# Patient Record
Sex: Male | Born: 1955 | Race: White | Hispanic: No | Marital: Married | Smoking: Never smoker
Health system: Southern US, Community
[De-identification: ages and names within clinical notes are randomized; demographics above are authoritative.]

## PROBLEM LIST (undated history)

## (undated) DIAGNOSIS — I700 Atherosclerosis of aorta: Secondary | ICD-10-CM

## (undated) DIAGNOSIS — K219 Gastro-esophageal reflux disease without esophagitis: Secondary | ICD-10-CM

## (undated) DIAGNOSIS — I420 Dilated cardiomyopathy: Secondary | ICD-10-CM

## (undated) DIAGNOSIS — R931 Abnormal findings on diagnostic imaging of heart and coronary circulation: Secondary | ICD-10-CM

## (undated) DIAGNOSIS — I712 Thoracic aortic aneurysm, without rupture: Secondary | ICD-10-CM

## (undated) DIAGNOSIS — Z87448 Personal history of other diseases of urinary system: Secondary | ICD-10-CM

## (undated) DIAGNOSIS — I351 Nonrheumatic aortic (valve) insufficiency: Secondary | ICD-10-CM

## (undated) DIAGNOSIS — D649 Anemia, unspecified: Secondary | ICD-10-CM

## (undated) DIAGNOSIS — I77810 Thoracic aortic ectasia: Secondary | ICD-10-CM

## (undated) DIAGNOSIS — Z9289 Personal history of other medical treatment: Secondary | ICD-10-CM

## (undated) DIAGNOSIS — I499 Cardiac arrhythmia, unspecified: Secondary | ICD-10-CM

## (undated) DIAGNOSIS — I5022 Chronic systolic (congestive) heart failure: Secondary | ICD-10-CM

## (undated) DIAGNOSIS — I493 Ventricular premature depolarization: Secondary | ICD-10-CM

## (undated) DIAGNOSIS — I10 Essential (primary) hypertension: Secondary | ICD-10-CM

## (undated) DIAGNOSIS — T4145XA Adverse effect of unspecified anesthetic, initial encounter: Secondary | ICD-10-CM

## (undated) DIAGNOSIS — I480 Paroxysmal atrial fibrillation: Secondary | ICD-10-CM

## (undated) DIAGNOSIS — Z8719 Personal history of other diseases of the digestive system: Secondary | ICD-10-CM

## (undated) DIAGNOSIS — I4891 Unspecified atrial fibrillation: Secondary | ICD-10-CM

## (undated) DIAGNOSIS — M069 Rheumatoid arthritis, unspecified: Secondary | ICD-10-CM

## (undated) DIAGNOSIS — K759 Inflammatory liver disease, unspecified: Secondary | ICD-10-CM

## (undated) DIAGNOSIS — R9430 Abnormal result of cardiovascular function study, unspecified: Secondary | ICD-10-CM

## (undated) DIAGNOSIS — Z95810 Presence of automatic (implantable) cardiac defibrillator: Secondary | ICD-10-CM

## (undated) DIAGNOSIS — T8859XA Other complications of anesthesia, initial encounter: Secondary | ICD-10-CM

## (undated) HISTORY — DX: Ventricular premature depolarization: I49.3

## (undated) HISTORY — DX: Thoracic aortic ectasia: I77.810

## (undated) HISTORY — PX: JOINT REPLACEMENT: SHX530

## (undated) HISTORY — PX: CARDIAC CATHETERIZATION: SHX172

## (undated) HISTORY — PX: COLONOSCOPY: SHX174

## (undated) HISTORY — PX: URETHRAL STRICTURE DILATATION: SHX477

## (undated) HISTORY — PX: TONSILLECTOMY: SUR1361

## (undated) HISTORY — PX: FOOT NEUROMA SURGERY: SHX646

## (undated) HISTORY — DX: Dilated cardiomyopathy: I42.0

## (undated) HISTORY — DX: Chronic systolic (congestive) heart failure: I50.22

## (undated) HISTORY — PX: URETHRAL FISTULA REPAIR: SHX2619

## (undated) HISTORY — DX: Essential (primary) hypertension: I10

## (undated) HISTORY — DX: Paroxysmal atrial fibrillation: I48.0

## (undated) HISTORY — DX: Abnormal findings on diagnostic imaging of heart and coronary circulation: R93.1

## (undated) HISTORY — DX: Atherosclerosis of aorta: I70.0

## (undated) HISTORY — PX: NASAL SEPTUM SURGERY: SHX37

---
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
## 2004-11-14 ENCOUNTER — Encounter: Admission: RE | Admit: 2004-11-14 | Discharge: 2004-11-14 | Payer: Self-pay | Admitting: Cardiology

## 2004-11-15 ENCOUNTER — Ambulatory Visit (HOSPITAL_COMMUNITY): Admission: RE | Admit: 2004-11-15 | Discharge: 2004-11-15 | Payer: Self-pay | Admitting: Cardiology

## 2007-03-15 ENCOUNTER — Encounter: Admission: RE | Admit: 2007-03-15 | Discharge: 2007-03-15 | Payer: Self-pay | Admitting: Family Medicine

## 2010-07-01 NOTE — Cardiovascular Report (Signed)
Terry [name], MERCADEL                  ACCOUNT NO.:  [id number]   MEDICAL RECORD NO.:  [id number]          PATIENT TYPE:  OIB   LOCATION:  2860                         FACILITY:  [facility]   PHYSICIAN:  Armanda Magic, M.D.     DATE OF BIRTH:  08/31/1955   DATE OF PROCEDURE:  11/15/2004  DATE OF DISCHARGE:                              CARDIAC CATHETERIZATION   REFERRING PHYSICIAN:  Holley Bouche, M.D.   PROCEDURE:  Left heart catheterization and coronary angiography.   OPERATOR:  Armanda Magic, M.D.   INDICATIONS:  Congestive heart failure and dilated cardiomyopathy.   COMPLICATIONS:  None.   IV ACCESS:  Via right femoral artery, 6-French sheath.   IV MEDICATIONS:  Versed 2 mg IV, fentanyl 25 mcg IV, and Lopressor 2.5 mg  IV.   This is a 55 year old white male who presented with new onset of congestive  heart failure and was found to have an idiopathic dilated cardiomyopathy by  2-D echocardiogram with an EF of 15 to 20%. He now presents for cardiac  catheterization.   The patient is brought to cardiac catheterization laboratory in a fasting  nonsedated state. Informed consent was obtained. The patient was connected  to continuous heart rate an pulse oximetry monitoring, intermittent blood  pressure monitoring. The right groin was prepped and draped in sterile  fashion. 1% Xylocaine was used for local anesthesia. Using modified  Seldinger technique, a 6-French sheath was placed in the right femoral  artery. Under fluoroscopic guidance, a 6-French JL-4 catheter was placed in  the left coronary artery. Multiple cine films were taken in 30 degree RAO,  40 degree LAO views. This catheter was then exchanged out over a guidewire  for 6-French JR-4 catheter which was placed under fluoroscopic guidance in  the right coronary artery. Multiple cine films were taken at 30 degree RAO,  40 degree LAO views. This catheter was then exchanged out over a guidewire  for 6-French angled pigtail  catheter which was placed under fluoroscopic  guidance in the left ventricular cavity. Left ventriculography was not  performed because the patient's LV EDP was 40 mmHg. The catheter was then  pulled back across the aortic valve with no significant gradient noted. At  the end of the procedure, all catheters and sheaths were removed. Manual  compression was performed until adequate hemostasis was obtained. The  patient was transferred back to his room in stable condition.   RESULTS:  1.  Left main coronary is widely patent and bifurcates in the left anterior      descending artery and left circumflex artery. Left anterior descending      artery is widely patent throughout its course giving rise to one      diagonal branch which is widely patent and bifurcates into two daughter      branches, both of which are widely patent.  2.  The left circumflex is very large and gives rise to 2 obtuse marginal      branches. The first obtuse marginal branch is rather large and      bifurcates into daughter branches,  both of which are widely patent. The      second obtuse marginal branch is a moderate size vessel which is widely      patent. The ongoing circumflex traverses the AV groove is widely patent.  3.  The right coronary is widely patent throughout its course and is an      extremely large vessel and bifurcates distally in the posterior      descending artery and posterior lateral artery.  4.  Left ventricular pressure 123/16 mmHg. Aortic pressure 127/83 mmHg. No      left ventriculogram was performed secondary to markedly elevated LV EDP      at 40 mmHg.   ASSESSMENT:  1.  Idiopathic cardiomyopathy, ejection fraction 15-20% by echocardiogram.  2.  Normal coronaries.  3.  Increased left ventricular end-diastolic pressure.  4.  Hypertension with elevated diastolic blood pressure of 104 mmHg the time      of catheterization. The patient did receive Lopressor 2.5 mg IV during      the  catheterization.   PLAN:  1.  Increase Coreg to 6.25 mg twice daily.  2.  Add spironolactone 25 mg a day.  3.  BMET and blood pressure check on Friday.  4.  Follow up with me in 1 week.      Armanda Magic, M.D.  Electronically Signed     TT/MEDQ  D:  11/15/2004  T:  11/15/2004  Job:  [id number]   cc:   Holley Bouche, M.D.  Fax: [phone]

## 2010-09-21 ENCOUNTER — Ambulatory Visit
Admission: RE | Admit: 2010-09-21 | Discharge: 2010-09-21 | Disposition: A | Discharge status: Home / Self Care | Payer: 59 | Source: Ambulatory Visit | Attending: Dermatology | Admitting: Dermatology

## 2010-09-21 ENCOUNTER — Other Ambulatory Visit: Payer: Self-pay | Admitting: Dermatology

## 2010-09-21 DIAGNOSIS — M25519 Pain in unspecified shoulder: Secondary | ICD-10-CM

## 2011-06-15 ENCOUNTER — Other Ambulatory Visit: Payer: Self-pay | Admitting: Family Medicine

## 2011-06-15 ENCOUNTER — Ambulatory Visit
Admission: RE | Admit: 2011-06-15 | Discharge: 2011-06-15 | Disposition: A | Discharge status: Home / Self Care | Payer: 59 | Source: Ambulatory Visit | Attending: Family Medicine | Admitting: Family Medicine

## 2011-06-15 DIAGNOSIS — M5416 Radiculopathy, lumbar region: Secondary | ICD-10-CM

## 2011-06-15 DIAGNOSIS — M25559 Pain in unspecified hip: Secondary | ICD-10-CM

## 2011-06-20 ENCOUNTER — Other Ambulatory Visit: Payer: Self-pay | Admitting: Family Medicine

## 2011-06-20 DIAGNOSIS — M5416 Radiculopathy, lumbar region: Secondary | ICD-10-CM

## 2011-06-22 ENCOUNTER — Ambulatory Visit
Admission: RE | Admit: 2011-06-22 | Discharge: 2011-06-22 | Disposition: A | Discharge status: Home / Self Care | Payer: 59 | Source: Ambulatory Visit | Attending: Family Medicine | Admitting: Family Medicine

## 2011-06-22 DIAGNOSIS — M5416 Radiculopathy, lumbar region: Secondary | ICD-10-CM

## 2011-08-21 ENCOUNTER — Other Ambulatory Visit (HOSPITAL_COMMUNITY): Payer: Self-pay | Admitting: Orthopaedic Surgery

## 2011-08-29 ENCOUNTER — Encounter (HOSPITAL_COMMUNITY): Payer: Self-pay | Admitting: Pharmacy Technician

## 2011-08-31 NOTE — Patient Instructions (Signed)
20 Terry [name]  08/31/2011   Your procedure is scheduled on:  09/08/11 1000am-12noon  Report to [facility] at 0730 AM.  Call this number if you have problems the morning of surgery: [phone]   Remember:   Do not eat food:After Midnight.  May have clear liquids:until Midnight .    Take these medicines the morning of surgery with A SIP OF WATER:   Do not wear jewelry,   Do not wear lotions, powders, or perfumes. .   Men may shave face and neck.  Do not bring valuables to the hospital.  Contacts, dentures or bridgework may not be worn into surgery.  Leave suitcase in the car. After surgery it may be brought to your room.  For patients admitted to the hospital, checkout time is 11:00 AM the day of discharge.      Special Instructions: CHG Shower Use Special Wash: 1/2 bottle night before surgery and 1/2 bottle morning of surgery. Shower chin to toes with CHG.  Wash face and private parts with regular soap.     Please read over the following fact sheets that you were given: MRSA Information, coughing and deep breathing exercises, leg exercises, Blood Transfusion Fact Sheet

## 2011-09-01 ENCOUNTER — Ambulatory Visit (HOSPITAL_COMMUNITY)
Admission: RE | Admit: 2011-09-01 | Discharge: 2011-09-01 | Disposition: A | Discharge status: Home / Self Care | Payer: 59 | Source: Ambulatory Visit | Attending: Orthopaedic Surgery | Admitting: Orthopaedic Surgery

## 2011-09-01 ENCOUNTER — Encounter (HOSPITAL_COMMUNITY): Payer: Self-pay

## 2011-09-01 ENCOUNTER — Encounter (HOSPITAL_COMMUNITY)
Admission: RE | Admit: 2011-09-01 | Discharge: 2011-09-01 | Disposition: A | Discharge status: Home / Self Care | Payer: 59 | Source: Ambulatory Visit | Attending: Orthopaedic Surgery | Admitting: Orthopaedic Surgery

## 2011-09-01 DIAGNOSIS — I10 Essential (primary) hypertension: Secondary | ICD-10-CM | POA: Insufficient documentation

## 2011-09-01 DIAGNOSIS — Z01812 Encounter for preprocedural laboratory examination: Secondary | ICD-10-CM | POA: Insufficient documentation

## 2011-09-01 DIAGNOSIS — I4891 Unspecified atrial fibrillation: Secondary | ICD-10-CM | POA: Insufficient documentation

## 2011-09-01 DIAGNOSIS — M1710 Unilateral primary osteoarthritis, unspecified knee: Secondary | ICD-10-CM | POA: Insufficient documentation

## 2011-09-01 HISTORY — DX: Gastro-esophageal reflux disease without esophagitis: K21.9

## 2011-09-01 HISTORY — DX: Adverse effect of unspecified anesthetic, initial encounter: T41.45XA

## 2011-09-01 HISTORY — DX: Other complications of anesthesia, initial encounter: T88.59XA

## 2011-09-01 HISTORY — DX: Personal history of other diseases of the digestive system: Z87.19

## 2011-09-01 HISTORY — DX: Cardiac arrhythmia, unspecified: I49.9

## 2011-09-01 HISTORY — DX: Nonrheumatic aortic (valve) insufficiency: I35.1

## 2011-09-01 HISTORY — DX: Abnormal result of cardiovascular function study, unspecified: R94.30

## 2011-09-01 HISTORY — DX: Inflammatory liver disease, unspecified: K75.9

## 2011-09-01 LAB — URINE MICROSCOPIC-ADD ON

## 2011-09-01 LAB — URINALYSIS, ROUTINE W REFLEX MICROSCOPIC
Bilirubin Urine: NEGATIVE
Glucose, UA: NEGATIVE mg/dL
Protein, ur: NEGATIVE mg/dL
Urobilinogen, UA: 0.2 mg/dL (ref 0.0–1.0)

## 2011-09-01 LAB — CBC
HCT: 32.1 % — ABNORMAL LOW (ref 39.0–52.0)
Hemoglobin: 10.4 g/dL — ABNORMAL LOW (ref 13.0–17.0)
MCH: 25.7 pg — ABNORMAL LOW (ref 26.0–34.0)
MCHC: 32.4 g/dL (ref 30.0–36.0)
MCV: 79.5 fL (ref 78.0–100.0)
Platelets: 372 10*3/uL (ref 150–400)
RBC: 4.04 MIL/uL — ABNORMAL LOW (ref 4.22–5.81)
RDW: 13.8 % (ref 11.5–15.5)
WBC: 7.6 10*3/uL (ref 4.0–10.5)

## 2011-09-01 LAB — COMPREHENSIVE METABOLIC PANEL
ALT: 5 U/L (ref 0–53)
AST: 10 U/L (ref 0–37)
Albumin: 3.2 g/dL — ABNORMAL LOW (ref 3.5–5.2)
CO2: 28 mEq/L (ref 19–32)
Calcium: 9.1 mg/dL (ref 8.4–10.5)
Chloride: 96 mEq/L (ref 96–112)
GFR calc non Af Amer: >90 mL/min (ref >90)
Sodium: 134 mEq/L — ABNORMAL LOW (ref 135–145)
Total Bilirubin: 0.3 mg/dL (ref 0.3–1.2)

## 2011-09-01 LAB — APTT: aPTT: 64 seconds — ABNORMAL HIGH (ref 24–37)

## 2011-09-01 LAB — PROTIME-INR: INR: 2.83 — ABNORMAL HIGH (ref 0.00–1.49)

## 2011-09-01 NOTE — Progress Notes (Signed)
09/01/11 Patient called and received message regarding positive pcr screen and voiced understanding.

## 2011-09-01 NOTE — Progress Notes (Signed)
LOV cardiology 08/24/11 on chart  08/24/11 EKG on chart  ECHO 08/25/11 on chart

## 2011-09-01 NOTE — Progress Notes (Signed)
09/01/11 fax and confirmation received regarding abnormal urinalysis and micro results to Dr Maureen Ralphs.

## 2011-09-01 NOTE — Progress Notes (Signed)
09/01/11 Fax and confirmation received to Dr Doneen Poisson regarding abnormal CXR done 09/01/11, abnormal PT - repeat to be done am of surgery and results of hgb-10.4 and hct 32.1 done on preop visit of 09/01/11.

## 2011-09-04 ENCOUNTER — Other Ambulatory Visit (HOSPITAL_COMMUNITY): Payer: Self-pay | Admitting: Orthopaedic Surgery

## 2011-09-08 ENCOUNTER — Ambulatory Visit (HOSPITAL_COMMUNITY): Payer: 59 | Admitting: Anesthesiology

## 2011-09-08 ENCOUNTER — Ambulatory Visit (HOSPITAL_COMMUNITY): Payer: 59

## 2011-09-08 ENCOUNTER — Encounter (HOSPITAL_COMMUNITY): Payer: Self-pay | Admitting: Anesthesiology

## 2011-09-08 ENCOUNTER — Inpatient Hospital Stay (HOSPITAL_COMMUNITY)
Admission: RE | Admit: 2011-09-08 | Discharge: 2011-09-11 | DRG: 470 | Disposition: A | Discharge status: Home / Self Care | Payer: 59 | Source: Ambulatory Visit | Attending: Orthopaedic Surgery | Admitting: Orthopaedic Surgery

## 2011-09-08 ENCOUNTER — Encounter (HOSPITAL_COMMUNITY): Payer: Self-pay | Admitting: Orthopedic Surgery

## 2011-09-08 ENCOUNTER — Encounter (HOSPITAL_COMMUNITY): Payer: Self-pay | Admitting: *Deleted

## 2011-09-08 ENCOUNTER — Encounter (HOSPITAL_COMMUNITY)
Admission: RE | Disposition: A | Discharge status: Home / Self Care | Payer: Self-pay | Source: Ambulatory Visit | Attending: Orthopaedic Surgery

## 2011-09-08 DIAGNOSIS — M25569 Pain in unspecified knee: Secondary | ICD-10-CM | POA: Diagnosis present

## 2011-09-08 DIAGNOSIS — K219 Gastro-esophageal reflux disease without esophagitis: Secondary | ICD-10-CM | POA: Diagnosis present

## 2011-09-08 DIAGNOSIS — Z79899 Other long term (current) drug therapy: Secondary | ICD-10-CM

## 2011-09-08 DIAGNOSIS — I4891 Unspecified atrial fibrillation: Secondary | ICD-10-CM | POA: Diagnosis present

## 2011-09-08 DIAGNOSIS — N189 Chronic kidney disease, unspecified: Secondary | ICD-10-CM | POA: Diagnosis present

## 2011-09-08 DIAGNOSIS — I509 Heart failure, unspecified: Secondary | ICD-10-CM | POA: Diagnosis present

## 2011-09-08 DIAGNOSIS — M069 Rheumatoid arthritis, unspecified: Secondary | ICD-10-CM | POA: Diagnosis present

## 2011-09-08 DIAGNOSIS — M1610 Unilateral primary osteoarthritis, unspecified hip: Principal | ICD-10-CM | POA: Diagnosis present

## 2011-09-08 DIAGNOSIS — M169 Osteoarthritis of hip, unspecified: Principal | ICD-10-CM | POA: Diagnosis present

## 2011-09-08 HISTORY — PX: TOTAL HIP ARTHROPLASTY: SHX124

## 2011-09-08 LAB — PROTIME-INR
INR: 1.11 (ref 0.00–1.49)
Prothrombin Time: 14.5 seconds (ref 11.6–15.2)

## 2011-09-08 LAB — PREPARE RBC (CROSSMATCH)

## 2011-09-08 SURGERY — ARTHROPLASTY, HIP, TOTAL, ANTERIOR APPROACH
Anesthesia: General | Site: Hip | Laterality: Right | Wound class: Clean

## 2011-09-08 MED ORDER — FUROSEMIDE 40 MG PO TABS
40.0000 mg | ORAL_TABLET | Freq: Two times a day (BID) | ORAL | Status: DC
  Administered 2011-09-09 – 2011-09-11 (×5): 40 mg via ORAL
  Filled 2011-09-08 (×9): qty 1

## 2011-09-08 MED ORDER — POTASSIUM CHLORIDE CRYS ER 20 MEQ PO TBCR
20.0000 meq | EXTENDED_RELEASE_TABLET | Freq: Two times a day (BID) | ORAL | Status: DC
  Administered 2011-09-08 – 2011-09-11 (×7): 20 meq via ORAL
  Filled 2011-09-08 (×9): qty 1

## 2011-09-08 MED ORDER — ONDANSETRON HCL 4 MG/2ML IJ SOLN
INTRAMUSCULAR | Status: DC | PRN
  Administered 2011-09-08 (×2): 2 mg via INTRAVENOUS

## 2011-09-08 MED ORDER — CEFAZOLIN SODIUM-DEXTROSE 2-3 GM-% IV SOLR
INTRAVENOUS | Status: AC
  Filled 2011-09-08: qty 50

## 2011-09-08 MED ORDER — HYDROMORPHONE HCL PF 1 MG/ML IJ SOLN
0.2500 mg | INTRAMUSCULAR | Status: DC | PRN
  Administered 2011-09-08 (×4): 0.5 mg via INTRAVENOUS

## 2011-09-08 MED ORDER — HYDROMORPHONE HCL PF 1 MG/ML IJ SOLN
INTRAMUSCULAR | Status: AC
  Filled 2011-09-08: qty 1

## 2011-09-08 MED ORDER — METHYLPREDNISOLONE ACETATE 40 MG/ML IJ SUSP
INTRAMUSCULAR | Status: AC
  Filled 2011-09-08: qty 5

## 2011-09-08 MED ORDER — ALUM & MAG HYDROXIDE-SIMETH 200-200-20 MG/5ML PO SUSP: 30.0000 mL | ORAL | Status: DC | PRN

## 2011-09-08 MED ORDER — METHOCARBAMOL 100 MG/ML IJ SOLN
500.0000 mg | Freq: Four times a day (QID) | INTRAVENOUS | Status: DC | PRN
  Filled 2011-09-08: qty 5

## 2011-09-08 MED ORDER — RIVAROXABAN 20 MG PO TABS
20.0000 mg | ORAL_TABLET | Freq: Every day | ORAL | Status: DC
  Administered 2011-09-09 – 2011-09-10 (×2): 20 mg via ORAL
  Administered 2011-09-11 (×2): 10 mg via ORAL
  Filled 2011-09-08 (×4): qty 1

## 2011-09-08 MED ORDER — DOCUSATE SODIUM 100 MG PO CAPS
100.0000 mg | ORAL_CAPSULE | Freq: Two times a day (BID) | ORAL | Status: DC
  Administered 2011-09-08 – 2011-09-11 (×5): 100 mg via ORAL

## 2011-09-08 MED ORDER — RAMIPRIL 5 MG PO CAPS
5.0000 mg | ORAL_CAPSULE | Freq: Every day | ORAL | Status: DC
  Administered 2011-09-09 – 2011-09-10 (×2): 5 mg via ORAL
  Filled 2011-09-08 (×4): qty 1

## 2011-09-08 MED ORDER — DEXAMETHASONE SODIUM PHOSPHATE 10 MG/ML IJ SOLN
INTRAMUSCULAR | Status: DC | PRN
  Administered 2011-09-08: 10 mg via INTRAVENOUS

## 2011-09-08 MED ORDER — NEOSTIGMINE METHYLSULFATE 1 MG/ML IJ SOLN
INTRAMUSCULAR | Status: DC | PRN
  Administered 2011-09-08: 1 mg via INTRAVENOUS

## 2011-09-08 MED ORDER — MORPHINE SULFATE 2 MG/ML IJ SOLN: 2.0000 mg | INTRAMUSCULAR | Status: DC | PRN

## 2011-09-08 MED ORDER — PHENOL 1.4 % MT LIQD: 1.0000 | OROMUCOSAL | Status: DC | PRN

## 2011-09-08 MED ORDER — MENTHOL 3 MG MT LOZG: 1.0000 | LOZENGE | OROMUCOSAL | Status: DC | PRN

## 2011-09-08 MED ORDER — CEFAZOLIN SODIUM-DEXTROSE 2-3 GM-% IV SOLR
2.0000 g | INTRAVENOUS | Status: AC
  Administered 2011-09-08: 2 g via INTRAVENOUS

## 2011-09-08 MED ORDER — DIPHENHYDRAMINE HCL 12.5 MG/5ML PO ELIX: 12.5000 mg | ORAL_SOLUTION | ORAL | Status: DC | PRN

## 2011-09-08 MED ORDER — FENTANYL CITRATE 0.05 MG/ML IJ SOLN
INTRAMUSCULAR | Status: DC | PRN
  Administered 2011-09-08: 50 ug via INTRAVENOUS
  Administered 2011-09-08: 100 ug via INTRAVENOUS
  Administered 2011-09-08: 50 ug via INTRAVENOUS
  Administered 2011-09-08: 100 ug via INTRAVENOUS
  Administered 2011-09-08: 150 ug via INTRAVENOUS

## 2011-09-08 MED ORDER — METHYLPREDNISOLONE ACETATE 40 MG/ML IJ SUSP
INTRAMUSCULAR | Status: DC | PRN
  Administered 2011-09-08: 1 mL

## 2011-09-08 MED ORDER — ONDANSETRON HCL 4 MG PO TABS: 4.0000 mg | ORAL_TABLET | Freq: Four times a day (QID) | ORAL | Status: DC | PRN

## 2011-09-08 MED ORDER — SODIUM CHLORIDE 0.9 % IV SOLN
INTRAVENOUS | Status: DC
  Administered 2011-09-08: 1000 mL via INTRAVENOUS

## 2011-09-08 MED ORDER — LIDOCAINE HCL (CARDIAC) 20 MG/ML IV SOLN
INTRAVENOUS | Status: DC | PRN
  Administered 2011-09-08: 75 mg via INTRAVENOUS

## 2011-09-08 MED ORDER — KETOROLAC TROMETHAMINE 15 MG/ML IJ SOLN
7.5000 mg | Freq: Four times a day (QID) | INTRAMUSCULAR | Status: AC
  Administered 2011-09-08 – 2011-09-09 (×4): 7.5 mg via INTRAVENOUS
  Filled 2011-09-08 (×4): qty 1

## 2011-09-08 MED ORDER — 0.9 % SODIUM CHLORIDE (POUR BTL) OPTIME
TOPICAL | Status: DC | PRN
  Administered 2011-09-08: 1000 mL

## 2011-09-08 MED ORDER — ACETAMINOPHEN 325 MG PO TABS: 650.0000 mg | ORAL_TABLET | Freq: Four times a day (QID) | ORAL | Status: DC | PRN

## 2011-09-08 MED ORDER — METHOCARBAMOL 500 MG PO TABS
500.0000 mg | ORAL_TABLET | Freq: Four times a day (QID) | ORAL | Status: DC | PRN
  Administered 2011-09-09: 500 mg via ORAL
  Filled 2011-09-08 (×2): qty 1

## 2011-09-08 MED ORDER — GLYCOPYRROLATE 0.2 MG/ML IJ SOLN
INTRAMUSCULAR | Status: DC | PRN
  Administered 2011-09-08: 0.2 mg via INTRAVENOUS

## 2011-09-08 MED ORDER — CARVEDILOL 25 MG PO TABS
25.0000 mg | ORAL_TABLET | Freq: Two times a day (BID) | ORAL | Status: DC
  Administered 2011-09-09 – 2011-09-11 (×5): 25 mg via ORAL
  Filled 2011-09-08 (×9): qty 1

## 2011-09-08 MED ORDER — LACTATED RINGERS IV SOLN: INTRAVENOUS | Status: DC

## 2011-09-08 MED ORDER — SODIUM CHLORIDE 0.9 % IV SOLN
10.0000 mg | INTRAVENOUS | Status: DC | PRN
  Administered 2011-09-08: 10 ug/min via INTRAVENOUS

## 2011-09-08 MED ORDER — MIDAZOLAM HCL 5 MG/5ML IJ SOLN
INTRAMUSCULAR | Status: DC | PRN
  Administered 2011-09-08 (×2): 1 mg via INTRAVENOUS

## 2011-09-08 MED ORDER — ACETAMINOPHEN 10 MG/ML IV SOLN
INTRAVENOUS | Status: DC | PRN
  Administered 2011-09-08: 1000 mg via INTRAVENOUS

## 2011-09-08 MED ORDER — FUROSEMIDE 10 MG/ML IJ SOLN
INTRAMUSCULAR | Status: DC | PRN
  Administered 2011-09-08: 20 mg via INTRAMUSCULAR

## 2011-09-08 MED ORDER — SODIUM CHLORIDE 0.9 % IV SOLN
Freq: Once | INTRAVENOUS | Status: AC
  Administered 2011-09-08: 1000 mL via INTRAVENOUS

## 2011-09-08 MED ORDER — RAMIPRIL 2.5 MG PO CAPS
2.5000 mg | ORAL_CAPSULE | Freq: Every day | ORAL | Status: DC
  Administered 2011-09-08 – 2011-09-11 (×4): 2.5 mg via ORAL
  Filled 2011-09-08 (×4): qty 1

## 2011-09-08 MED ORDER — ZOLPIDEM TARTRATE 5 MG PO TABS: 5.0000 mg | ORAL_TABLET | Freq: Every evening | ORAL | Status: DC | PRN

## 2011-09-08 MED ORDER — FERROUS SULFATE 325 (65 FE) MG PO TABS
325.0000 mg | ORAL_TABLET | Freq: Three times a day (TID) | ORAL | Status: DC
  Administered 2011-09-08 – 2011-09-11 (×8): 325 mg via ORAL
  Filled 2011-09-08 (×11): qty 1

## 2011-09-08 MED ORDER — RIVAROXABAN 20 MG PO TABS: 20.0000 mg | ORAL_TABLET | Freq: Every day | ORAL | Status: DC

## 2011-09-08 MED ORDER — ACETAMINOPHEN 650 MG RE SUPP: 650.0000 mg | Freq: Four times a day (QID) | RECTAL | Status: DC | PRN

## 2011-09-08 MED ORDER — BUPIVACAINE HCL 0.25 % IJ SOLN
INTRAMUSCULAR | Status: DC | PRN
  Administered 2011-09-08: 4 mL

## 2011-09-08 MED ORDER — CEFAZOLIN SODIUM 1-5 GM-% IV SOLN
1.0000 g | Freq: Four times a day (QID) | INTRAVENOUS | Status: AC
  Administered 2011-09-08 (×2): 1 g via INTRAVENOUS
  Filled 2011-09-08 (×2): qty 50

## 2011-09-08 MED ORDER — CISATRACURIUM BESYLATE (PF) 10 MG/5ML IV SOLN
INTRAVENOUS | Status: DC | PRN
  Administered 2011-09-08: 10 mg via INTRAVENOUS
  Administered 2011-09-08: 2 mg via INTRAVENOUS
  Administered 2011-09-08: 4 mg via INTRAVENOUS
  Administered 2011-09-08: 2 mg via INTRAVENOUS

## 2011-09-08 MED ORDER — PROPOFOL 10 MG/ML IV EMUL
INTRAVENOUS | Status: DC | PRN
  Administered 2011-09-08: 175 mg via INTRAVENOUS
  Administered 2011-09-08: 25 mg via INTRAVENOUS

## 2011-09-08 MED ORDER — ONDANSETRON HCL 4 MG/2ML IJ SOLN: 4.0000 mg | Freq: Four times a day (QID) | INTRAMUSCULAR | Status: DC | PRN

## 2011-09-08 MED ORDER — FUROSEMIDE 10 MG/ML IJ SOLN
INTRAMUSCULAR | Status: AC
  Filled 2011-09-08: qty 2

## 2011-09-08 MED ORDER — LACTATED RINGERS IV SOLN
INTRAVENOUS | Status: DC
  Administered 2011-09-08 (×2): via INTRAVENOUS
  Administered 2011-09-08: 1000 mL via INTRAVENOUS

## 2011-09-08 MED ORDER — OXYCODONE HCL 5 MG PO TABS
5.0000 mg | ORAL_TABLET | ORAL | Status: DC | PRN
  Administered 2011-09-08 – 2011-09-11 (×11): 10 mg via ORAL
  Filled 2011-09-08 (×12): qty 2

## 2011-09-08 MED ORDER — BUPIVACAINE HCL (PF) 0.25 % IJ SOLN
INTRAMUSCULAR | Status: AC
  Filled 2011-09-08: qty 30

## 2011-09-08 MED ORDER — METOCLOPRAMIDE HCL 5 MG/ML IJ SOLN: 5.0000 mg | Freq: Three times a day (TID) | INTRAMUSCULAR | Status: DC | PRN

## 2011-09-08 MED ORDER — PROMETHAZINE HCL 25 MG/ML IJ SOLN: 6.2500 mg | INTRAMUSCULAR | Status: DC | PRN

## 2011-09-08 MED ORDER — ACETAMINOPHEN 10 MG/ML IV SOLN
INTRAVENOUS | Status: AC
  Filled 2011-09-08: qty 100

## 2011-09-08 MED ORDER — METOCLOPRAMIDE HCL 10 MG PO TABS: 5.0000 mg | ORAL_TABLET | Freq: Three times a day (TID) | ORAL | Status: DC | PRN

## 2011-09-08 SURGICAL SUPPLY — 35 items
BAG SPEC THK2 15X12 ZIP CLS (MISCELLANEOUS) ×2
BAG ZIPLOCK 12X15 (MISCELLANEOUS) ×4 IMPLANT
BLADE SAW SGTL 18X1.27X75 (BLADE) ×2 IMPLANT
CLOTH BEACON ORANGE TIMEOUT ST (SAFETY) ×2 IMPLANT
DRAPE C-ARM 42X72 X-RAY (DRAPES) ×2 IMPLANT
DRAPE STERI IOBAN 125X83 (DRAPES) ×2 IMPLANT
DRAPE U-SHAPE 47X51 STRL (DRAPES) ×6 IMPLANT
DRSG MEPILEX BORDER 4X8 (GAUZE/BANDAGES/DRESSINGS) ×2 IMPLANT
DURAPREP 26ML APPLICATOR (WOUND CARE) ×2 IMPLANT
ELECT BLADE TIP CTD 4 INCH (ELECTRODE) ×2 IMPLANT
ELECT REM PT RETURN 9FT ADLT (ELECTROSURGICAL) ×2
ELECTRODE REM PT RTRN 9FT ADLT (ELECTROSURGICAL) ×1 IMPLANT
FACESHIELD LNG OPTICON STERILE (SAFETY) ×8 IMPLANT
GAUZE XEROFORM 1X8 LF (GAUZE/BANDAGES/DRESSINGS) ×2 IMPLANT
GLOVE BIO SURGEON STRL SZ7 (GLOVE) ×2 IMPLANT
GLOVE BIO SURGEON STRL SZ7.5 (GLOVE) ×2 IMPLANT
GLOVE BIOGEL PI IND STRL 7.5 (GLOVE) IMPLANT
GLOVE BIOGEL PI IND STRL 8 (GLOVE) ×1 IMPLANT
GLOVE BIOGEL PI INDICATOR 7.5 (GLOVE)
GLOVE BIOGEL PI INDICATOR 8 (GLOVE) ×1
GLOVE ECLIPSE 7.0 STRL STRAW (GLOVE) ×2 IMPLANT
GOWN STRL REIN XL XLG (GOWN DISPOSABLE) ×4 IMPLANT
KIT BASIN OR (CUSTOM PROCEDURE TRAY) ×2 IMPLANT
PACK TOTAL JOINT (CUSTOM PROCEDURE TRAY) ×2 IMPLANT
PADDING CAST COTTON 6X4 STRL (CAST SUPPLIES) ×2 IMPLANT
STAPLER VISISTAT 35W (STAPLE) IMPLANT
SUT ETHIBOND NAB CT1 #1 30IN (SUTURE) ×4 IMPLANT
SUT VIC AB 1 CT1 36 (SUTURE) ×4 IMPLANT
SUT VIC AB 2-0 CT1 27 (SUTURE) ×4
SUT VIC AB 2-0 CT1 TAPERPNT 27 (SUTURE) ×2 IMPLANT
SUT VLOC 180 0 24IN GS25 (SUTURE) ×1 IMPLANT
TAPE STRIPS DRAPE STRL (GAUZE/BANDAGES/DRESSINGS) ×1 IMPLANT
TOWEL OR 17X26 10 PK STRL BLUE (TOWEL DISPOSABLE) ×4 IMPLANT
TOWEL OR NON WOVEN STRL DISP B (DISPOSABLE) ×2 IMPLANT
TRAY FOLEY CATH 14FRSI W/METER (CATHETERS) ×2 IMPLANT

## 2011-09-08 NOTE — H&P (Signed)
Terry [name] is an 56 y.o. male.   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  Given the failure of conservative treatment, his daily pain as well as decreased mobility, he wishes to proceed with a right total hip replacement and a steroid injection in his left knee.  His hip x-rays show bone-on-bone wear.  He understands fully the risks of infection, blood loss, nerve injury, fracture and DVT.  The goals are decreased pain and improved mobility.  Past Medical History  Diagnosis Date  . Dysrhythmia     atrial fib   . Complication of anesthesia     hx of irregular beat after anesthesia > 20 yrs ago   . Chronic kidney disease     hx of uti   . GERD (gastroesophageal reflux disease)   . H/O hiatal hernia   . Arthritis     rheumatoid  . Hepatitis     hx of as a child   . CHF (congestive heart failure)     nonischemic dilated cardiomyopathy  . Aortic insufficiency     mild   . Ejection fraction < 50%     by echo 08/25/11     Past Surgical History  Procedure Date  . Deviatede septum,   . Urinary stricture    . 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 Colonoscopy     x2    History reviewed. No pertinent family history. Social History:  reports that he has never smoked. He has never used smokeless tobacco. He reports that he does not use illicit drugs. His alcohol history not on file.  Allergies:  Allergies  Allergen Reactions  . Sulfa Drugs Cross Reactors Hives and Other (See Comments)    thrush    Medications Prior to Admission  Medication Sig Dispense Refill  . carvedilol (COREG) 25 MG tablet Take 25 mg by mouth 2 (two) times daily with a meal.      . furosemide (LASIX) 40 MG tablet Take 40 mg by mouth 2 (two) times daily.      . potassium chloride SA (K-DUR,KLOR-CON) 20 MEQ tablet Take 20 mEq by mouth 2 (two) times daily.      . ramipril (ALTACE) 2.5 MG capsule Take 2.5  mg by mouth daily with breakfast.      . ramipril (ALTACE) 5 MG capsule Take 5 mg by mouth daily with breakfast.      . traMADol (ULTRAM) 50 MG tablet Take 50 mg by mouth every 6 (six) hours as needed. pain      . naproxen sodium (ANAPROX) 220 MG tablet Take 220 mg by mouth 2 (two) times daily with a meal.      . Rivaroxaban (XARELTO) 20 MG TABS Take 20 mg by mouth daily.        Results for orders placed during the hospital encounter of 09/08/11 (from the past 48 hour(s))  TYPE AND SCREEN     Status: Normal (Preliminary result)   Collection Time   09/08/11  7:05 AM      Component Value Range Comment   ABO/RH(D) O POS      Antibody Screen PENDING      Sample Expiration 09/11/2011     PROTIME-INR     Status: Normal   Collection Time   09/08/11  7:48 AM  Component Value Range Comment   Prothrombin Time 14.5  11.6 - 15.2 seconds    INR 1.11  0.00 - 1.49   ABO/RH     Status: Normal   Collection Time   09/08/11  8:00 AM      Component Value Range Comment   ABO/RH(D) O POS      X-ray Chest Pa Or Ap  09/08/2011  *RADIOLOGY REPORT*  Clinical Data: Possible pulmonary nodule, repeat PA radiograph with nipple markers  CHEST - 1 VIEW  Comparison: 09/01/2011; 11/14/2004  Findings: Grossly unchanged cardiac silhouette and mediastinal contours.  Apparent nodular opacity overlying the right lower lung is confirmed to represent a nipple shadow. There is persistent mild diffuse thickening of the pulmonary interstitium.  No focal airspace opacities. No pleural effusion or pneumothorax.  Grossly unchanged bones.  IMPRESSION: 1.  The previously questioned nodular opacity overlying the right lower lung is confirmed to represent a nipple shadow. 2.  Chronic mild bronchitic change without acute cardiopulmonary disease.  Original Report Authenticated By: Waynard Reeds, M.D.    Review of Systems  All other systems reviewed and are negative.    Blood pressure 118/79, pulse 68, temperature 98.6 F (37 C),  resp. rate 20, SpO2 69.00%. Physical Exam  Constitutional: He is oriented to person, place, and time. He appears well-developed and well-nourished.  HENT:  Head: Normocephalic and atraumatic.  Eyes: EOM are normal. Pupils are equal, round, and reactive to light.  Neck: Normal range of motion. Neck supple.  Cardiovascular: Normal rate and regular rhythm.   Respiratory: Effort normal and breath sounds normal.  GI: Soft. Bowel sounds are normal.  Musculoskeletal:       Right hip: He exhibits decreased range of motion, bony tenderness and crepitus.       Left knee: He exhibits effusion. tenderness found. Medial joint line and lateral joint line tenderness noted.  Neurological: He is alert and oriented to person, place, and time.  Skin: Skin is warm and dry.  Psychiatric: He has a normal mood and affect.     Assessment/Plan End-stage arthritis right hip and left knee pain 1)  To the OR today for a right total hip replacement and a steroid injection into his left knee  Pamela Intrieri Y 09/08/2011, 9:26 AM

## 2011-09-08 NOTE — Anesthesia Postprocedure Evaluation (Signed)
Anesthesia Post Note  Patient: Terry [name]  Procedure(s) Performed: Procedure(s) (LRB): TOTAL HIP ARTHROPLASTY ANTERIOR APPROACH (Right)  Anesthesia type: General  Patient location: PACU  Post pain: Pain level controlled  Post assessment: Post-op Vital signs reviewed  Last Vitals:  Filed Vitals:   09/08/11 1320  BP:   Pulse:   Temp: 37 C  Resp:     Post vital signs: Reviewed  Level of consciousness: sedated  Complications: No apparent anesthesia complications

## 2011-09-08 NOTE — Brief Op Note (Signed)
09/08/2011  12:15 PM  PATIENT:  Terry [name]  56 y.o. male  PRE-OPERATIVE DIAGNOSIS:  Severe osteoarthritis right hip, Left knee pain  POST-OPERATIVE DIAGNOSIS:  severe osteoarthritis right hip, left knee pain  PROCEDURE:  Procedure(s) (LRB): TOTAL HIP ARTHROPLASTY ANTERIOR APPROACH (Right)  SURGEON:  Surgeon(s) and Role:    * [name], MD - Primary  PHYSICIAN ASSISTANT:   ASSISTANTS: Maud Deed, PA-C   ANESTHESIA:   general  EBL:  Total I/O In: 2000 [I.V.:2000] Out: 1750 [Urine:750; Blood:1000]  BLOOD ADMINISTERED:none  DRAINS: none   LOCAL MEDICATIONS USED:  NONE  SPECIMEN:  No Specimen  DISPOSITION OF SPECIMEN:  N/A  COUNTS:  YES  TOURNIQUET:  * No tourniquets in log *  DICTATION: .Other Dictation: Dictation Number [id number]  PLAN OF CARE: Admit to inpatient   PATIENT DISPOSITION:  PACU - hemodynamically stable.   Delay start of Pharmacological VTE agent (>24hrs) due to surgical blood loss or risk of bleeding: no

## 2011-09-08 NOTE — Anesthesia Preprocedure Evaluation (Addendum)
Anesthesia Evaluation  Patient identified by MRN, date of birth, ID band Patient awake    Reviewed: Allergy & Precautions, H&P , NPO status , Patient's Chart, lab work & pertinent test results  Airway Mallampati: II TM Distance: >3 FB Neck ROM: Full    Dental  (+) Teeth Intact and Dental Advisory Given   Pulmonary neg pulmonary ROS,  breath sounds clear to auscultation  Pulmonary exam normal       Cardiovascular +CHF negative cardio ROS  + dysrhythmias Atrial Fibrillation + Valvular Problems/Murmurs AI and MR Rhythm:Regular Rate:Normal     Neuro/Psych negative neurological ROS  negative psych ROS   GI/Hepatic negative GI ROS, Neg liver ROS, hiatal hernia, GERD-  ,(+) Hepatitis -  Endo/Other  negative endocrine ROS  Renal/GU Renal diseasenegative Renal ROS  negative genitourinary   Musculoskeletal negative musculoskeletal ROS (+)   Abdominal   Peds  Hematology negative hematology ROS (+)   Anesthesia Other Findings   Reproductive/Obstetrics negative OB ROS                          Anesthesia Physical Anesthesia Plan  ASA: III  Anesthesia Plan: General   Post-op Pain Management:    Induction: Intravenous  Airway Management Planned: Oral ETT  Additional Equipment:   Intra-op Plan:   Post-operative Plan: Extubation in OR  Informed Consent: I have reviewed the patients History and Physical, chart, labs and discussed the procedure including the risks, benefits and alternatives for the proposed anesthesia with the patient or authorized representative who has indicated his/her understanding and acceptance.   Dental advisory given  Plan Discussed with: CRNA  Anesthesia Plan Comments:        Anesthesia Quick Evaluation

## 2011-09-08 NOTE — Preoperative (Signed)
Beta Blockers   Reason not to administer Beta Blockers:Not Applicable pt took beta blocker 

## 2011-09-08 NOTE — Progress Notes (Signed)
Utilization review completed.  

## 2011-09-08 NOTE — Transfer of Care (Signed)
Immediate Anesthesia Transfer of Care Note  Patient: Terry [name]  Procedure(s) Performed: Procedure(s) (LRB): TOTAL HIP ARTHROPLASTY ANTERIOR APPROACH (Right)  Patient Location: PACU  Anesthesia Type: General  Level of Consciousness: awake, oriented, pateint uncooperative, lethargic and responds to stimulation  Airway & Oxygen Therapy: Patient Spontanous Breathing and Patient connected to face mask oxygen  Post-op Assessment: Report given to PACU RN, Post -op Vital signs reviewed and stable and Patient moving all extremities  Post vital signs: Reviewed and stable  Complications: No apparent anesthesia complications

## 2011-09-08 NOTE — Anesthesia Procedure Notes (Signed)
Procedure Name: Intubation Date/Time: 09/08/2011 10:06 AM Performed by: Edison Pace Pre-anesthesia Checklist: Patient identified, Timeout performed, Emergency Drugs available, Suction available and Patient being monitored Patient Re-evaluated:Patient Re-evaluated prior to inductionOxygen Delivery Method: Circle system utilized and Simple face mask Preoxygenation: Pre-oxygenation with 100% oxygen Intubation Type: Cricoid Pressure applied and Combination inhalational/ intravenous induction Ventilation: Mask ventilation without difficulty and Mask ventilation throughout procedure Laryngoscope Size: Mac and 4 Grade View: Grade II Tube type: Oral Tube size: 7.5 mm Number of attempts: 1 Airway Equipment and Method: Stylet Placement Confirmation: ETT inserted through vocal cords under direct vision,  positive ETCO2 and breath sounds checked- equal and bilateral Secured at: 21 cm Tube secured with: Tape Dental Injury: Teeth and Oropharynx as per pre-operative assessment

## 2011-09-09 LAB — CBC
HCT: 25.9 % — ABNORMAL LOW (ref 39.0–52.0)
MCHC: 35.1 g/dL (ref 30.0–36.0)
RDW: 13.6 % (ref 11.5–15.5)
WBC: 10.8 10*3/uL — ABNORMAL HIGH (ref 4.0–10.5)

## 2011-09-09 LAB — BASIC METABOLIC PANEL
BUN: 13 mg/dL (ref 6–23)
Chloride: 94 mEq/L — ABNORMAL LOW (ref 96–112)
GFR calc Af Amer: >90 mL/min (ref >90)
GFR calc non Af Amer: >90 mL/min (ref >90)
Potassium: 4.6 mEq/L (ref 3.5–5.1)
Sodium: 128 mEq/L — ABNORMAL LOW (ref 135–145)

## 2011-09-09 NOTE — Progress Notes (Signed)
Physical Therapy Treatment Patient Details Name: Terry [name] MRN: [id number] DOB: 1955/07/15 Today's Date: 09/09/2011 Time: 0865-7846 PT Time Calculation (min): 26 min  PT Assessment / Plan / Recommendation Comments on Treatment Session  Pt very motivated and detail oriented    Follow Up Recommendations  Home health PT    Barriers to Discharge        Equipment Recommendations  Rolling walker with 5" wheels;3 in 1 bedside comode    Recommendations for Other Services OT consult  Frequency 7X/week   Plan Discharge plan remains appropriate    Precautions / Restrictions Precautions Precautions: None Restrictions Weight Bearing Restrictions: No Other Position/Activity Restrictions: WBAT   Pertinent Vitals/Pain 3/10    Mobility  Bed Mobility Bed Mobility: Sit to Supine Sit to Supine: 4: Min assist Details for Bed Mobility Assistance: cues for sequence and use of UEs to self assist  Transfers Transfers: Sit to Stand;Stand to Sit Sit to Stand: 4: Min assist Stand to Sit: 4: Min assist Details for Transfer Assistance: cues for LE managment and use of UEs to self assist Ambulation/Gait Ambulation/Gait Assistance: 4: Min assist Ambulation Distance (Feet): 148 Feet Assistive device: Rolling walker Ambulation/Gait Assistance Details: cues for posture, sequence, stride length and position from RW Gait Pattern: Step-to pattern    Exercises     PT Diagnosis:    PT Problem List:   PT Treatment Interventions:     PT Goals Acute Rehab PT Goals PT Goal Formulation: With patient Time For Goal Achievement: 09/12/11 Potential to Achieve Goals: Good Pt will go Supine/Side to Sit: with supervision PT Goal: Supine/Side to Sit - Progress: Goal set today Pt will go Sit to Supine/Side: with supervision PT Goal: Sit to Supine/Side - Progress: Goal set today Pt will go Sit to Stand: with supervision PT Goal: Sit to Stand - Progress: Progressing toward goal Pt will go Stand to Sit:  with supervision PT Goal: Stand to Sit - Progress: Progressing toward goal Pt will Ambulate: 51 - 150 feet;with supervision;with rolling walker PT Goal: Ambulate - Progress: Progressing toward goal Pt will Go Up / Down Stairs: 3-5 stairs;with min assist;with least restrictive assistive device PT Goal: Up/Down Stairs - Progress: Goal set today  Visit Information  Last PT Received On: 09/09/11 Assistance Needed: +1    Subjective Data  Subjective: I'm doing pretty good Patient Stated Goal: Resume previous lifestyle with decreased pain   Cognition  Overall Cognitive Status: Appears within functional limits for tasks assessed/performed Arousal/Alertness: Awake/alert Orientation Level: Appears intact for tasks assessed Behavior During Session: [facility] for tasks performed    Balance     End of Session PT - End of Session Activity Tolerance: Patient tolerated treatment well Patient left: in bed;with call bell/phone within reach;with family/visitor present Nurse Communication: Mobility status   GP     Terry [name] 09/09/2011, 2:48 PM

## 2011-09-09 NOTE — Op Note (Signed)
Terry [name], Terry [name] NO.:  [id number]  MEDICAL RECORD NO.:  [id number]  LOCATION:  1602                         FACILITY:  [facility]  PHYSICIAN:  Vanita Panda. Magnus Ivan, M.D.DATE OF BIRTH:  January 07, 1956  DATE OF PROCEDURE:  09/08/2011 DATE OF DISCHARGE:                              OPERATIVE REPORT   PREOPERATIVE DIAGNOSES: 1. End-stage arthritis, right hip. 2. Knee pain.  POSTOPERATIVE DIAGNOSIS: 1. End-stage arthritis, right hip. 2. Knee pain.  PROCEDURE: 1. Right total hip arthroplasty through direct anterior approach. 2. Steroid injection, left knee.  IMPLANTS:  DePuy Sector Gription, acetabular component size 60, size 36+ 4 neutral polyethylene liner, size 12 Corail femoral component with standard offset, size 36+ 1.5 ceramic hip ball.  SURGEON:  Vanita Panda. Magnus Ivan, MD  ASSISTANT:  Wende Neighbors, PA-C.  ANTIBIOTICS:  2 g IV Ancef.  BLOOD LOSS:  About 1000 mL.  COMPLICATIONS:  None.  INDICATIONS:  Terry [name] is a 56 year old gentleman who has been on Xarelto as a blood thinner.  He has cardiac issues, however he also has left knee pain and severe end-stage arthritis, well documented of his right hip.  He has been cleared by Cardiology and Surgery and does wish to proceed with a right total hip arthroplasty.  The risks and benefits of surgery were then explained to him in full detail and he does wish to proceed with surgery.  PROCEDURE DESCRIPTION:  After informed consent was obtained, appropriate left knee and right hip were marked.  He was brought to the operating room.  General anesthesia was obtained, and a Foley catheter was placed, and we saw that his legs were fairly edematous, so put TED hose on his legs.  We then prepped his left knee while he was on the stretcher with Betadine alcohol.  After time-out was called, and he was identified as the correct patient, correct left knee.  We placed a steroid injection of 1 mL of Depo-Medrol  mixed with 4 mL of plain Sensorcaine to the left knee.  We placed a Band-Aid over this.  We then placed him in traction boots on his feet and then put him supine on the Hana fracture table with the perineal post in place and both legs placed in in-line skeletal traction but no traction applied.  We then assessed his right hip under direct fluoroscopy and then prepped the right hip with DuraPrep and sterile drapes.  A time-out was called, he was identified as correct patient, correct right hip.  I then made an incision just posterior and inferior to the anterior-superior iliac spine and carried this obliquely down the leg.  I dissected down to the tensor fascia lata and then proceeded with a direct anterior approach to hip once the tensor fascia was divided longitudinally.  Cobra retractors were placed around the lateral neck and then one around the medial neck, cauterized the lateral femoral circumflex vessels and then made my arthrotomy to the hip.  I placed the Cobra retractors within the arthrotomy and used an oscillating saw to make my femoral neck cut.  I finished this cut with an osteotome.  I placed a corkscrew guide in the femoral  head and removed the femoral head in its entirety.  We then cleaned the acetabulum, soft tissue and debris and placed a bent Hohmann medially and a Cobra retractor laterally.  I began reaming from size 44 reamer in 2 mm increments, all the way up to a size 60 with all reamers placed under direct visualization and the last few reamers placed under direct fluoroscopy as well as I could obtain my depth of reaming as well as my inclination and anteversion.  I then placed the real 60, size 60 femoral component, which was a Actor.  Attention was then turned to the femur.  All traction was off the leg and the leg was externally rotated to 90 degrees, extended and adducted, this allowed access to the femoral canal.  I used a box cutting guide to  gain access to the femoral canal.  I released the lateral capsule and then began broaching from a size 8 broach all the way to a size 12.  The size 12 broach was felt to be stable.  So I trialed a 36+ 1.5 hip ball.  We brought the leg back over and up and with traction and internal rotation, reduced the hip. There was minimal shock.  His leg lengths were measured to be near equal and it was stable with internal and external rotation.  I then re- dislocated the hip and removed all trial components.  I then placed the real Corail femoral component size 12 with standard offset and the real 36+ 1.5 ceramic hip ball and reduced this in acetabulum again it was stable.  I then copiously irrigated soft tissues.  After assessing the hip again with fluoroscopy, this was the irrigated with normal saline solution.  I then closed remnants of the joint capsule with #1 Ethibond, followed by a running 0 V lock suture in the tensor fascia lata, 2-0 Vicryl to the subcutaneous tissue, and staples on the skin.  A well- padded sterile dressing was applied and there were no complications noted.  Given his heart history and his blood loss as well as low- pressure, because they gave him Lasix due to his peripheral edema.  We are going to give him a unit of blood in the PACU precautionary.  Of note, Maud Deed PA-C was present and assisted in the entire case.     Vanita Panda. Magnus Ivan, M.D.     CYB/MEDQ  D:  09/08/2011  T:  09/09/2011  Job:  [id number]

## 2011-09-09 NOTE — Evaluation (Signed)
Physical Therapy Evaluation Patient Details Name: Terry [name] MRN: [id number] DOB: 07-Nov-1955 Today's Date: 09/09/2011 Time: 4098-1191 PT Time Calculation (min): 40 min  PT Assessment / Plan / Recommendation Clinical Impression  Pt with R THR presents with decreased R LE strength/ROM and limitations in functional mobility    PT Assessment  Patient needs continued PT services    Follow Up Recommendations  Home health PT    Barriers to Discharge        Equipment Recommendations  Rolling walker with 5" wheels;3 in 1 bedside comode    Recommendations for Other Services OT consult   Frequency 7X/week    Precautions / Restrictions Precautions Precautions: None Restrictions Weight Bearing Restrictions: No Other Position/Activity Restrictions: WBAT   Pertinent Vitals/Pain 4-5/10; meds requested      Mobility  Bed Mobility Bed Mobility: Supine to Sit Supine to Sit: 4: Min assist Details for Bed Mobility Assistance: cues for sequence and use of UEs to self assist  Transfers Transfers: Sit to Stand;Stand to Sit Sit to Stand: 4: Min assist Stand to Sit: 4: Min assist Details for Transfer Assistance: cues for LE managment and use of UEs to self assist Ambulation/Gait Ambulation/Gait Assistance: 4: Min assist Ambulation Distance (Feet): 48 Feet Assistive device: Rolling walker Ambulation/Gait Assistance Details: cues for sequence, posture, and position from RW Gait Pattern: Step-to pattern    Exercises Total Joint Exercises Ankle Circles/Pumps: AROM;15 reps;Both;Supine Quad Sets: AROM;10 reps;Both;Supine Heel Slides: AAROM;15 reps;Supine;Right Hip ABduction/ADduction: AROM;10 reps;Supine;Left   PT Diagnosis: Difficulty walking  PT Problem List: Decreased strength;Decreased range of motion;Decreased activity tolerance;Decreased mobility;Decreased knowledge of use of DME;Pain PT Treatment Interventions: DME instruction;Gait training;Stair training;Functional mobility  training;Therapeutic activities;Therapeutic exercise;Patient/family education   PT Goals Acute Rehab PT Goals PT Goal Formulation: With patient Time For Goal Achievement: 09/12/11 Potential to Achieve Goals: Good Pt will go Supine/Side to Sit: with supervision PT Goal: Supine/Side to Sit - Progress: Goal set today Pt will go Sit to Supine/Side: with supervision PT Goal: Sit to Supine/Side - Progress: Goal set today Pt will go Sit to Stand: with supervision PT Goal: Sit to Stand - Progress: Goal set today Pt will go Stand to Sit: with supervision PT Goal: Stand to Sit - Progress: Goal set today Pt will Ambulate: 51 - 150 feet;with supervision;with rolling walker PT Goal: Ambulate - Progress: Goal set today Pt will Go Up / Down Stairs: 3-5 stairs;with min assist;with least restrictive assistive device PT Goal: Up/Down Stairs - Progress: Goal set today  Visit Information  Last PT Received On: 09/09/11 Assistance Needed: +1    Subjective Data  Subjective: I was limping around pretty bad before surgery Patient Stated Goal: Resume previous lifestyle with decreased pain   Prior Functioning  Home Living Lives With: Spouse Available Help at Discharge: Family Type of Home: House Home Access: Stairs to enter Secretary/administrator of Steps: 3 Entrance Stairs-Rails: Right;Left Home Layout: Able to live on main level with bedroom/bathroom Home Adaptive Equipment: None Prior Function Level of Independence: Independent Able to Take Stairs?: Yes Driving: Yes Vocation: Full time employment Communication Communication: No difficulties Dominant Hand: Right    Cognition  Overall Cognitive Status: Appears within functional limits for tasks assessed/performed Arousal/Alertness: Awake/alert Orientation Level: Appears intact for tasks assessed Behavior During Session: [facility] for tasks performed    Extremity/Trunk Assessment Right Upper Extremity Assessment RUE ROM/Strength/Tone: [facility] for  tasks assessed Left Upper Extremity Assessment LUE ROM/Strength/Tone: [facility] for tasks assessed Right Lower Extremity Assessment RLE ROM/Strength/Tone: Deficits RLE  ROM/Strength/Tone Deficits: 2+/5 hip strength with AAROM hip flex to 75 and abd to 20 Left Lower Extremity Assessment LLE ROM/Strength/Tone: [facility] for tasks assessed   Balance    End of Session PT - End of Session Activity Tolerance: Patient tolerated treatment well Patient left: in chair;with call bell/phone within reach;with family/visitor present Nurse Communication: Mobility status  GP     Terry [name] 09/09/2011, 12:57 PM

## 2011-09-09 NOTE — Progress Notes (Signed)
Subjective: 1 Day Post-Op Procedure(s) (LRB): TOTAL HIP ARTHROPLASTY ANTERIOR APPROACH (Right) Patient is awake alert oriented x4 he is walking in the hallway with assistance doing quite well with a walker. His discomfort is moderate. Dressing is dry right leg is neurovascularly normal. Foley catheter removed he is voiding without difficulty. Patient reports pain as moderate.    Objective:   VITALS:  Temp:  [97.3 F (36.3 C)-98.3 F (36.8 C)] 98.3 F (36.8 C) (07/27 1407) Pulse Rate:  [59-73] 66  (07/27 1407) Resp:  [14-16] 16  (07/27 1407) BP: (105-135)/(69-81) 118/71 mmHg (07/27 1407) SpO2:  [85 %-100 %] 97 % (07/27 1407)  Neurologically intact ABD soft Neurovascular intact Sensation intact distally Intact pulses distally Dorsiflexion/Plantar flexion intact Incision: no drainage No cellulitis present   LABS  Basename 09/09/11 0447  HGB 9.1*  WBC 10.8*  PLT 320    Basename 09/09/11 0447  NA 128*  K 4.6  CL 94*  CO2 26  BUN 13  CREATININE 0.79  GLUCOSE 209*    Basename 09/08/11 0748  LABPT --  INR 1.11     Assessment/Plan: 1 Day Post-Op Procedure(s) (LRB): TOTAL HIP ARTHROPLASTY ANTERIOR APPROACH (Right)  Advance diet Up with therapy Continue with physical therapy tomorrow plan for discharge on Monday. NITKA,JAMES E 09/09/2011, 3:41 PM

## 2011-09-10 LAB — CBC
HCT: 26.7 % — ABNORMAL LOW (ref 39.0–52.0)
Hemoglobin: 9.4 g/dL — ABNORMAL LOW (ref 13.0–17.0)
RBC: 3.36 MIL/uL — ABNORMAL LOW (ref 4.22–5.81)
WBC: 13.7 10*3/uL — ABNORMAL HIGH (ref 4.0–10.5)

## 2011-09-10 NOTE — Progress Notes (Signed)
Physical Therapy Treatment Patient Details Name: Terry [name] MRN: [id number] DOB: 04-23-1955 Today's Date: 09/10/2011 Time: 4098-1191 PT Time Calculation (min): 17 min  PT Assessment / Plan / Recommendation Comments on Treatment Session  Pt progressing well.  Should be ready for d/c in am.  Reviewed car transfers with pt and spouse    Follow Up Recommendations  Home health PT    Barriers to Discharge        Equipment Recommendations  Rolling walker with 5" wheels;3 in 1 bedside comode    Recommendations for Other Services OT consult  Frequency 7X/week   Plan Discharge plan remains appropriate    Precautions / Restrictions Precautions Precautions: None Restrictions Weight Bearing Restrictions: No Other Position/Activity Restrictions: WBAT   Pertinent Vitals/Pain     Mobility  Bed Mobility Details for Bed Mobility Assistance: cues for sequence and use of UEs to self assist  Transfers Transfers: Sit to Stand;Stand to Sit Sit to Stand: 5: Supervision;From chair/3-in-1;With armrests Stand to Sit: 5: Supervision;With upper extremity assist;To chair/3-in-1 Details for Transfer Assistance: cues for LE managment and use of UEs to self assist Ambulation/Gait Ambulation/Gait Assistance: 4: Min guard Ambulation Distance (Feet): 50 Feet Assistive device: Rolling walker Ambulation/Gait Assistance Details: min cues for posture and position from RW Gait Pattern: Step-to pattern Stairs: Yes Stairs Assistance: 4: Min assist Stairs Assistance Details (indicate cue type and reason): cues for sequence and foot/crutch placement Stair Management Technique: One rail Left;Step to pattern;Forwards;With crutches Number of Stairs: 4  (twice - second attempt with spouse assisting)    Exercises     PT Diagnosis:    PT Problem List:   PT Treatment Interventions:     PT Goals Acute Rehab PT Goals PT Goal Formulation: With patient Time For Goal Achievement: 09/12/11 Potential to Achieve  Goals: Good Pt will go Supine/Side to Sit: with supervision PT Goal: Supine/Side to Sit - Progress: Progressing toward goal Pt will go Sit to Supine/Side: with supervision PT Goal: Sit to Supine/Side - Progress: Progressing toward goal Pt will go Sit to Stand: with supervision PT Goal: Sit to Stand - Progress: Progressing toward goal Pt will go Stand to Sit: with supervision PT Goal: Stand to Sit - Progress: Progressing toward goal Pt will Ambulate: 51 - 150 feet;with supervision;with rolling walker PT Goal: Ambulate - Progress: Progressing toward goal Pt will Go Up / Down Stairs: 3-5 stairs;with min assist;with least restrictive assistive device PT Goal: Up/Down Stairs - Progress: Progressing toward goal  Visit Information  Last PT Received On: 09/10/11 Assistance Needed: +1    Subjective Data  Subjective: I'm ready to try the stairs Patient Stated Goal: Resume previous lifestyle with decreased pain   Cognition  Overall Cognitive Status: Appears within functional limits for tasks assessed/performed Arousal/Alertness: Awake/alert Orientation Level: Appears intact for tasks assessed Behavior During Session: [facility] for tasks performed    Balance     End of Session PT - End of Session Equipment Utilized During Treatment: Gait belt Activity Tolerance: Patient tolerated treatment well Patient left: in chair;with call bell/phone within reach Nurse Communication: Mobility status   GP     Dilana Mcphie 09/10/2011, 1:21 PM

## 2011-09-10 NOTE — Evaluation (Signed)
Occupational Therapy Evaluation Patient Details Name: Terry [name] MRN: [id number] DOB: 1955-12-25 Today's Date: 09/10/2011 Time: 4098-1191 OT Time Calculation (min): 10 min  OT Assessment / Plan / Recommendation Clinical Impression  This 56 y.o. male admitted for direct approach THA.  Pt. has adequate support at home to assist with ADLs.  Currently, he requires mod A for LB.  All instruction completed.  Pt. will need 3-in-1 commode at discharge.  No further OT needs identified.  Will sign off.    OT Assessment  Patient does not need any further OT services    Follow Up Recommendations  No OT follow up;Supervision - Intermittent    Barriers to Discharge      Equipment Recommendations  Rolling walker with 5" wheels;3 in 1 bedside comode    Recommendations for Other Services    Frequency       Precautions / Restrictions Precautions Precautions: None Restrictions Weight Bearing Restrictions: No Other Position/Activity Restrictions: WBAT       ADL  Eating/Feeding: Simulated;Independent Where Assessed - Eating/Feeding: Chair Grooming: Simulated;Wash/dry hands;Wash/dry face;Denture care;Supervision/safety Where Assessed - Grooming: Supported standing Upper Body Bathing: Simulated;Set up Where Assessed - Upper Body Bathing: Supported sitting Lower Body Bathing: Simulated;Moderate assistance Where Assessed - Lower Body Bathing: Supported standing Upper Body Dressing: Simulated;Set up Where Assessed - Upper Body Dressing: Unsupported sitting Lower Body Dressing: Simulated;Moderate assistance Where Assessed - Lower Body Dressing: Unsupported sit to stand Toilet Transfer: Simulated;Supervision/safety Toilet Transfer Method: Sit to stand Toilet Transfer Equipment: Raised toilet seat with arms (or 3-in-1 over toilet) Toileting - Clothing Manipulation and Hygiene: Simulated;Supervision/safety Where Assessed - Glass blower/designer Manipulation and Hygiene: Standing Equipment Used:  Rolling walker Transfers/Ambulation Related to ADLs: supervision with sit to stand ADL Comments: Pt. unable to access Rt. foot for LB ADLs - limited effort given.  Reports that dtrs and wife will assist him.  Pt. able to maintain static and dynamic standing with supervision to simulate shower in standing; however, pt reports he is going to have wife bathe him.  Encouraged pt to use family as back up only, and to attempt LB ADLs daily as it will be assist with increasing strength and flexibilty of the Rt. LE.  Pt. informed of use of 3-in-1 for over the toilet (pt. unable to move sit to stand from low surface) and that it can be used in the shower as needed.  Pt. verbalized understanding of all.    OT Diagnosis:    OT Problem List:   OT Treatment Interventions:     OT Goals    Visit Information  Last OT Received On: 09/10/11 Assistance Needed: +1    Subjective Data  Subjective: "My wife will bathe me.  I'm just going to let her do that"   Patient Stated Goal: To return to previous lifestyle   Prior Functioning  Vision/Perception  Home Living Lives With: Spouse Available Help at Discharge: Family (wife and 2 dtrs) Type of Home: House Home Access: Stairs to enter Secretary/administrator of Steps: 3 Entrance Stairs-Rails: Right;Left Home Layout: Able to live on main level with bedroom/bathroom Bathroom Shower/Tub: Health visitor: Standard Bathroom Accessibility: Yes How Accessible: Accessible via walker Home Adaptive Equipment: None Prior Function Level of Independence: Independent Able to Take Stairs?: Yes Driving: Yes Vocation: Full time employment Communication Communication: No difficulties Dominant Hand: Right      Cognition  Overall Cognitive Status: Appears within functional limits for tasks assessed/performed Arousal/Alertness: Awake/alert Orientation Level: Appears intact for tasks assessed  Behavior During Session: [facility] for tasks performed      Extremity/Trunk Assessment Right Upper Extremity Assessment RUE ROM/Strength/Tone: [facility] for tasks assessed Left Upper Extremity Assessment LUE ROM/Strength/Tone: [facility] for tasks assessed Trunk Assessment Trunk Assessment: Normal   Mobility Transfers Transfers: Sit to Stand;Stand to Sit Sit to Stand: 5: Supervision;From elevated surface;With upper extremity assist;From chair/3-in-1 Stand to Sit: 5: Supervision;With upper extremity assist;To chair/3-in-1   Exercise    Balance Balance Balance Assessed: Yes Static Standing Balance Static Standing - Level of Assistance: 5: Stand by assistance Dynamic Standing Balance Dynamic Standing - Balance Support: Left upper extremity supported Dynamic Standing - Level of Assistance: 5: Stand by assistance Dynamic Standing - Balance Activities:  (simulated shower)  End of Session OT - End of Session Activity Tolerance: Patient tolerated treatment well Patient left: in chair;with call bell/phone within reach  GO     Nykiah Ma M 09/10/2011, 9:41 AM

## 2011-09-10 NOTE — Progress Notes (Signed)
Subjective: 2 Days Post-Op Procedure(s) (LRB): TOTAL HIP ARTHROPLASTY ANTERIOR APPROACH (Right) This patient is awake alert oriented x4 some hiccuping what he has intermittently even would not in the hospital. Patient reports pain as 4 on 0-10 scale.    Objective: Vital signs in last 24 hours: Temp:  [96.8 F (36 C)-98.2 F (36.8 C)] 96.8 F (36 C) (07/28 1112) Pulse Rate:  [65-67] 65  (07/28 1112) Resp:  [16-18] 16  (07/28 1150) BP: (105-123)/(73-75) 105/73 mmHg (07/28 1112) SpO2:  [92 %-98 %] 95 % (07/28 1150)  Intake/Output from previous day: 07/27 0701 - 07/28 0700 In: 1412.5 [P.O.:840; I.V.:572.5] Out: 2200 [Urine:2200] Intake/Output this shift: Total I/O In: 480 [P.O.:480] Out: 300 [Urine:300]   Basename 09/10/11 0441 09/09/11 0447  HGB 9.4* 9.1*    Basename 09/10/11 0441 09/09/11 0447  WBC 13.7* 10.8*  RBC 3.36* 3.27*  HCT 26.7* 25.9*  PLT 376 320    Basename 09/09/11 0447  NA 128*  K 4.6  CL 94*  CO2 26  BUN 13  CREATININE 0.79  GLUCOSE 209*  CALCIUM 8.5    Basename 09/08/11 0748  LABPT --  INR 1.11    Neurologically intact ABD soft Neurovascular intact Sensation intact distally Intact pulses distally Dorsiflexion/Plantar flexion intact Incision: scant drainage  Assessment/Plan: 2 Days Post-Op Procedure(s) (LRB): TOTAL HIP ARTHROPLASTY ANTERIOR APPROACH (Right) Advance diet Up with therapy D/C IV fluids Plan for discharge tomorrow  Jessi Pitstick E 09/10/2011, 2:30 PM

## 2011-09-10 NOTE — Progress Notes (Signed)
Physical Therapy Treatment Patient Details Name: CEPHAS REVARD MRN: [id number] DOB: 21-Jan-1956 Today's Date: 09/10/2011 Time: 0825-0911 PT Time Calculation (min): 46 min  PT Assessment / Plan / Recommendation Comments on Treatment Session  Pt very motivated and detail oriented    Follow Up Recommendations  Home health PT    Barriers to Discharge        Equipment Recommendations  Rolling walker with 5" wheels;3 in 1 bedside comode    Recommendations for Other Services OT consult  Frequency 7X/week   Plan Discharge plan remains appropriate    Precautions / Restrictions Precautions Precautions: None Restrictions Weight Bearing Restrictions: No Other Position/Activity Restrictions: WBAT   Pertinent Vitals/Pain 4/10; premedicated, ice pack provided    Mobility  Bed Mobility Bed Mobility: Supine to Sit Supine to Sit: 4: Min assist Details for Bed Mobility Assistance: cues for sequence and use of UEs to self assist  Transfers Transfers: Sit to Stand;Stand to Sit Sit to Stand: 5: Supervision;From elevated surface;With upper extremity assist;From chair/3-in-1 Stand to Sit: 5: Supervision;With upper extremity assist;To chair/3-in-1 Details for Transfer Assistance: cues for LE managment and use of UEs to self assist Ambulation/Gait Ambulation/Gait Assistance: 4: Min assist Ambulation Distance (Feet): 189 Feet Assistive device: Rolling walker Ambulation/Gait Assistance Details: min cues for posture and sequence Gait Pattern: Step-to pattern Gait velocity: slow    Exercises Total Joint Exercises Ankle Circles/Pumps: AROM;15 reps;Both;Supine Quad Sets: AROM;10 reps;Both;Supine Gluteal Sets: AROM;20 reps;Supine;Both Heel Slides: AAROM;20 reps;Supine;Right Hip ABduction/ADduction: AAROM;20 reps;Supine;Right   PT Diagnosis:    PT Problem List:   PT Treatment Interventions:     PT Goals Acute Rehab PT Goals PT Goal Formulation: With patient Time For Goal Achievement:  09/12/11 Potential to Achieve Goals: Good Pt will go Supine/Side to Sit: with supervision PT Goal: Supine/Side to Sit - Progress: Progressing toward goal Pt will go Sit to Supine/Side: with supervision PT Goal: Sit to Supine/Side - Progress: Progressing toward goal Pt will go Sit to Stand: with supervision PT Goal: Sit to Stand - Progress: Progressing toward goal Pt will go Stand to Sit: with supervision PT Goal: Stand to Sit - Progress: Progressing toward goal Pt will Ambulate: 51 - 150 feet;with supervision;with rolling walker PT Goal: Ambulate - Progress: Progressing toward goal Pt will Go Up / Down Stairs: 3-5 stairs;with min assist;with least restrictive assistive device  Visit Information  Last PT Received On: 09/10/11 Assistance Needed: +1    Subjective Data  Subjective: I'm doing pretty good Patient Stated Goal: Resume previous lifestyle with decreased pain   Cognition  Overall Cognitive Status: Appears within functional limits for tasks assessed/performed Arousal/Alertness: Awake/alert Orientation Level: Appears intact for tasks assessed Behavior During Session: [facility] for tasks performed    Balance  Balance Balance Assessed: Yes Static Standing Balance Static Standing - Level of Assistance: 5: Stand by assistance Dynamic Standing Balance Dynamic Standing - Balance Support: Left upper extremity supported Dynamic Standing - Level of Assistance: 5: Stand by assistance Dynamic Standing - Balance Activities:  (simulated shower)  End of Session PT - End of Session Activity Tolerance: Patient tolerated treatment well Patient left: in chair;with call bell/phone within reach Nurse Communication: Mobility status   GP     Dann Ventress 09/10/2011, 12:17 PM

## 2011-09-10 NOTE — Progress Notes (Addendum)
Cm spoke with patient concerning dc planning. Pt offered choice for [facility]. Per pt choice [facility] to provide [facility] services upon discharge. Pt request RW, cane, & BSC. [facility] notified of HH, DME referral. Demographics, H/P, progress notes faxed to [facility] at [phone]. DME delivery scheduled to room prior to discharge. Patient states spouse & adult children to assist in home care. No other needs specified. Awaiting MD HH orders.   Leonie Green [phone]

## 2011-09-11 LAB — CBC
Hemoglobin: 8 g/dL — ABNORMAL LOW (ref 13.0–17.0)
MCH: 27.7 pg (ref 26.0–34.0)
Platelets: 301 10*3/uL (ref 150–400)
RBC: 2.89 MIL/uL — ABNORMAL LOW (ref 4.22–5.81)
WBC: 11.2 10*3/uL — ABNORMAL HIGH (ref 4.0–10.5)

## 2011-09-11 MED ORDER — OXYCODONE-ACETAMINOPHEN 5-325 MG PO TABS: 1.0000 | ORAL_TABLET | ORAL | Status: AC | PRN

## 2011-09-11 MED ORDER — RAMIPRIL 5 MG PO CAPS
5.0000 mg | ORAL_CAPSULE | Freq: Every day | ORAL | Status: DC
  Administered 2011-09-11: 5 mg via ORAL
  Filled 2011-09-11: qty 1

## 2011-09-11 MED ORDER — FERROUS SULFATE 325 (65 FE) MG PO TABS: 325.0000 mg | ORAL_TABLET | Freq: Three times a day (TID) | ORAL | Status: DC

## 2011-09-11 MED ORDER — METHOCARBAMOL 500 MG PO TABS: 500.0000 mg | ORAL_TABLET | Freq: Four times a day (QID) | ORAL | Status: AC | PRN

## 2011-09-11 NOTE — Discharge Summary (Signed)
Patient ID: Terry [name] MRN: [id number] DOB/AGE: 56-24-1957 56 y.o.  Admit date: 09/08/2011 Discharge date: 09/11/2011  Admission Diagnoses:  Principal Problem:  *Degenerative arthritis of hip   Discharge Diagnoses:  Same  Past Medical History  Diagnosis Date  . Dysrhythmia     atrial fib   . Complication of anesthesia     hx of irregular beat after anesthesia > 20 yrs ago   . Chronic kidney disease     hx of uti   . GERD (gastroesophageal reflux disease)   . H/O hiatal hernia   . Arthritis     rheumatoid  . Hepatitis     hx of as a child   . CHF (congestive heart failure)     nonischemic dilated cardiomyopathy  . Aortic insufficiency     mild   . Ejection fraction < 50%     by echo 08/25/11     Surgeries: Procedure(s): TOTAL HIP ARTHROPLASTY ANTERIOR APPROACH on 09/08/2011   Consultants:    Discharged Condition: Improved  Hospital Course: RAKAN SOFFER is an 56 y.o. male who was admitted 09/08/2011 for operative treatment ofDegenerative arthritis of hip. Patient has severe unremitting pain that affects sleep, daily activities, and work/hobbies. After pre-op clearance the patient was taken to the operating room on 09/08/2011 and underwent  Procedure(s): TOTAL HIP ARTHROPLASTY ANTERIOR APPROACH.    Patient was given perioperative antibiotics: Anti-infectives     Start     Dose/Rate Route Frequency Ordered Stop   09/08/11 1600   ceFAZolin (ANCEF) IVPB 1 g/50 mL premix        1 g 100 mL/hr over 30 Minutes Intravenous Every 6 hours 09/08/11 1402 09/08/11 2135   09/08/11 0721   ceFAZolin (ANCEF) IVPB 2 g/50 mL premix        2 g 100 mL/hr over 30 Minutes Intravenous 60 min pre-op 09/08/11 0721 09/08/11 1007           Patient was given sequential compression devices, early ambulation, and chemoprophylaxis to prevent DVT.  Patient benefited maximally from hospital stay and there were no complications.    Recent vital signs: Patient Vitals for the past 24 hrs:  BP  Temp Temp src Pulse Resp SpO2  09/11/11 0628 118/75 mmHg 98.2 F (36.8 C) Oral 70  16  97 %  24-Sep-2011 2200 101/63 mmHg 98.1 F (36.7 C) Oral 79  15  96 %  09/24/2011 1850 122/76 mmHg 98.1 F (36.7 C) Axillary 81  16  96 %  09/24/11 1535 - - - - 16  95 %  09-24-11 1150 - - - - 16  95 %  09-24-11 1112 105/73 mmHg 96.8 F (36 C) Axillary 65  16  98 %  2011/09/24 0800 - - - - 16  98 %     Recent laboratory studies:  Basename 09/11/11 0355 2011/09/24 0441 09/09/11 0447 09/08/11 0748  WBC 11.2* 13.7* -- --  HGB 8.0* 9.4* -- --  HCT 23.2* 26.7* -- --  PLT 301 376 -- --  NA -- -- 128* --  K -- -- 4.6 --  CL -- -- 94* --  CO2 -- -- 26 --  BUN -- -- 13 --  CREATININE -- -- 0.79 --  GLUCOSE -- -- 209* --  INR -- -- -- 1.11  CALCIUM -- -- 8.5 --     Discharge Medications:   Medication List  As of 09/11/2011  6:33 AM   TAKE these medications  carvedilol 25 MG tablet   Commonly known as: COREG   Take 25 mg by mouth 2 (two) times daily with a meal.      ferrous sulfate 325 (65 FE) MG tablet   Take 1 tablet (325 mg total) by mouth 3 (three) times daily after meals.      furosemide 40 MG tablet   Commonly known as: LASIX   Take 40 mg by mouth 2 (two) times daily.      methocarbamol 500 MG tablet   Commonly known as: ROBAXIN   Take 1 tablet (500 mg total) by mouth every 6 (six) hours as needed.      naproxen sodium 220 MG tablet   Commonly known as: ANAPROX   Take 220 mg by mouth 2 (two) times daily with a meal.      oxyCODONE-acetaminophen 5-325 MG per tablet   Commonly known as: PERCOCET/ROXICET   Take 1-2 tablets by mouth every 4 (four) hours as needed for pain.      potassium chloride SA 20 MEQ tablet   Commonly known as: K-DUR,KLOR-CON   Take 20 mEq by mouth 2 (two) times daily.      ramipril 2.5 MG capsule   Commonly known as: ALTACE   Take 2.5 mg by mouth daily with breakfast.      ramipril 5 MG capsule   Commonly known as: ALTACE   Take 5 mg by mouth daily  with breakfast.      traMADol 50 MG tablet   Commonly known as: ULTRAM   Take 50 mg by mouth every 6 (six) hours as needed. pain      XARELTO 20 MG Tabs   Generic drug: Rivaroxaban   Take 20 mg by mouth daily.            Diagnostic Studies: X-ray Chest Pa Or Ap  09/08/2011  *RADIOLOGY REPORT*  Clinical Data: Possible pulmonary nodule, repeat PA radiograph with nipple markers  CHEST - 1 VIEW  Comparison: 09/01/2011; 11/14/2004  Findings: Grossly unchanged cardiac silhouette and mediastinal contours.  Apparent nodular opacity overlying the right lower lung is confirmed to represent a nipple shadow. There is persistent mild diffuse thickening of the pulmonary interstitium.  No focal airspace opacities. No pleural effusion or pneumothorax.  Grossly unchanged bones.  IMPRESSION: 1.  The previously questioned nodular opacity overlying the right lower lung is confirmed to represent a nipple shadow. 2.  Chronic mild bronchitic change without acute cardiopulmonary disease.  Original Report Authenticated By: Waynard Reeds, M.D.   Dg Chest 2 View  09/01/2011  *RADIOLOGY REPORT*  Clinical Data: Preoperative assessment for right hip replacement, history atrial fibrillation, hypertension  CHEST - 2 VIEW  Comparison: 11/14/2005  Findings: Normal heart size, mediastinal contours, and pulmonary vascularity. Lungs appear mildly hyperaerated but clear. No pleural effusion or pneumothorax. Question right nipple shadow. Bones unremarkable.  IMPRESSION: Question right nipple shadow; recommend repeat PA chest radiograph with nipple markers to exclude pulmonary nodule.  Original Report Authenticated By: Lollie Marrow, M.D.   Dg Hip Complete Right  09/08/2011  *RADIOLOGY REPORT*  Clinical Data: Right hip arthroplasty  RIGHT HIP - COMPLETE 2+ VIEW  Comparison: Preoperative radiographs 06/15/2011  Findings: Fluoroscopic spot images obtained at the time of anterior approach right hip arthroplasty.  Images demonstrate  surgical changes of right hip arthroplasty.  The femoral head component appears located with respect to the acetabular component on this single view.  No evidence of periprosthetic fracture, or other immediate  hardware complication.  IMPRESSION: Fluoroscopic spot images obtained during right hip replacement as above.  Original Report Authenticated By: Alvino Blood Pelvis Portable  09/08/2011  *RADIOLOGY REPORT*  Clinical Data: Postop  PORTABLE PELVIS  Comparison: Right hip same day.  Findings: Single portable view of the pelvis submitted.  There is a right hip prosthesis in anatomic alignment.  No acute fracture or subluxation.  Postsurgical changes are noted with right lateral skin staples.  IMPRESSION: Right hip prosthesis in anatomic alignment.  Original Report Authenticated By: Natasha Mead, M.D.   Dg Hip Portable 1 View Right  09/08/2011  *RADIOLOGY REPORT*  Clinical Data: Postop right hip  PORTABLE RIGHT HIP - 1 VIEW  Comparison: 09/08/2011  Findings: Single portable view of the right hip submitted.  There is a right hip prosthesis  in anatomic alignment.  Postsurgical changes are noted with lateral skin staples.  IMPRESSION: Right hip prosthesis in anatomic alignment.  Original Report Authenticated By: Natasha Mead, M.D.   Dg C-arm 61-120 Min-no Report  09/08/2011  CLINICAL DATA: perioperative use   C-ARM 61-120 MINUTES  Fluoroscopy was utilized by the requesting physician.  No radiographic  interpretation.      Disposition: to home  Discharge Orders    Future Orders Please Complete By Expires   Diet - low sodium heart healthy      Call MD / Call 911      Comments:   If you experience chest pain or shortness of breath, CALL 911 and be transported to the hospital emergency room.  If you develope a fever above 101 F, pus (white drainage) or increased drainage or redness at the wound, or calf pain, call your surgeon's office.   Constipation Prevention      Comments:   Drink plenty of fluids.  Prune  juice may be helpful.  You may use a stool softener, such as Colace (over the counter) 100 mg twice a day.  Use MiraLax (over the counter) for constipation as needed.   Increase activity slowly as tolerated      Discharge instructions      Comments:   Expect a lot of thigh and leg swelling. Take iron 3 times daily with meals for the next 7-10 day due to low hemoglobin.  You can get your actual incision wet starting 7/31, then daily dry dressing. Call [facility] with questions/concerns.   Discharge patient            Signed: [name] 09/11/2011, 6:33 AM

## 2011-09-11 NOTE — Progress Notes (Signed)
Patient ID: Terry [name], male   DOB: 1955-11-09, 56 y.o.   MRN: [id number] Acute blood loss anemia with Hgb of 8, but no symptoms.  Doing well with therapy.  Will discharge to home today.

## 2011-09-11 NOTE — Progress Notes (Signed)
Physical Therapy Treatment Patient Details Name: Terry [name] MRN: [id number] DOB: [birth year] Today's Date: 09/11/2011 Time: 6295-2841 PT Time Calculation (min): 25 min  PT Assessment / Plan / Recommendation Comments on Treatment Session  Pt eager to get home today.  Equipment delivered and "in the car".  Practiced stairs with spouse and givien HEP handout.    Follow Up Recommendations  Home health PT    Barriers to Discharge        Equipment Recommendations  Other (comment) (equipment wad delivered and received)    Recommendations for Other Services    Frequency 7X/week   Plan      Precautions / Restrictions Precautions Precautions: None Precaution Comments: Direct Anterior approach Restrictions Weight Bearing Restrictions: No Other Position/Activity Restrictions: WBAT   Pertinent Vitals/Pain C/o "soreness" ICE applied    Mobility  Bed Mobility Bed Mobility: Supine to Sit Supine to Sit: 4: Min guard Details for Bed Mobility Assistance: min assist to support R LE off bed and increased time  Transfers Transfers: Sit to Stand;Stand to Sit Sit to Stand: 6: Modified independent (Device/Increase time);From bed Stand to Sit: 6: Modified independent (Device/Increase time);To chair/3-in-1 Details for Transfer Assistance: increased time and good use of hands  Ambulation/Gait Ambulation/Gait Assistance: 5: Supervision Ambulation Distance (Feet): 125 Feet Assistive device: Rolling walker Ambulation/Gait Assistance Details: one VC for safety with turns Gait Pattern: Step-to pattern Gait velocity: decreased  Stairs: Yes Stairs Assistance: 4: Min guard Stairs Assistance Details (indicate cue type and reason): with spouse and 59% VC's on proper sequencing and crutch placement Stair Management Technique: One rail Left;Forwards;Step to pattern Number of Stairs: 4     Exercises HEP hand out given   PT Goals                                          progressing    Visit  Information  Last PT Received On: 09/11/11 Assistance Needed: +1                   End of Session PT - End of Session Equipment Utilized During Treatment: Gait belt Activity Tolerance: Patient tolerated treatment well Patient left: in chair;with call bell/phone within reach;with family/visitor present Nurse Communication: Other (comment) (Pt ready for D/C to home)  Felecia Shelling  PTA [facility]  Acute  Rehab Pager     [phone]

## 2011-09-11 NOTE — Progress Notes (Signed)
CARE MANAGEMENT NOTE 09/11/2011  Patient:  Terry [name], Terry [name]   Account Number:  [id number]  Date Initiated:  09/10/2011  Documentation initiated by:  DAVIS,TYMEEKA  Subjective/Objective Assessment:   56 yo male admitted s/p right total hip replacement. PTA pt employed full time, lived home with wife.     Action/Plan:   home when stable   Anticipated DC Date:  09/11/2011   Anticipated DC Plan:  HOME W HOME HEALTH SERVICES  In-house referral  NA      DC Planning Services  CM consult      PAC Choice  DURABLE MEDICAL EQUIPMENT  HOME HEALTH   Choice offered to / List presented to:  C-1 Patient   DME arranged  3-N-1  CANE  WALKER - ROLLING      DME agency  [facility]     HH arranged  HH-2 PT      [facility] agency  [facility]   Status of service:  Completed, signed off Medicare Important Message given?  NO (If response is "NO", the following Medicare IM given date fields will be blank) Date Medicare IM given:   Date Additional Medicare IM given:    Discharge Disposition:  HOME W HOME HEALTH SERVICES  Comments:  09/11/2011 Raynelle Bring BSN CCM [phone] DME has been delivered to patient's room. FOr discharge today with [facility] to start services tomorrow 09/12/2011.

## 2011-09-12 LAB — TYPE AND SCREEN
ABO/RH(D): O POS
Unit division: 0

## 2011-09-13 ENCOUNTER — Encounter (HOSPITAL_COMMUNITY): Payer: Self-pay | Admitting: Orthopaedic Surgery

## 2012-10-22 ENCOUNTER — Other Ambulatory Visit (HOSPITAL_COMMUNITY): Payer: Self-pay | Admitting: Orthopaedic Surgery

## 2012-11-21 ENCOUNTER — Encounter (HOSPITAL_COMMUNITY): Payer: Self-pay | Admitting: Pharmacy Technician

## 2012-11-22 ENCOUNTER — Other Ambulatory Visit (HOSPITAL_COMMUNITY): Payer: Self-pay | Admitting: Orthopaedic Surgery

## 2012-11-22 NOTE — Progress Notes (Signed)
LOV NOTE DFR TURNER 06-26-12 ON CHART CARDIAC CLEARANCE NOTE DR TURNER 10-15-12 ON CHART 10-26-11 MONITOR REPORT ON CHART ECHO 08-25-11 [facility] ON CHART

## 2012-11-22 NOTE — Patient Instructions (Addendum)
20 Terry [name]  11/22/2012   Your procedure is scheduled on: 11-29-2012  Report to [facility] at 530  AM.  Call this number if you have problems the morning of surgery [phone]   Remember: YOUR BLOOD TYPE WILL BE DRAWN MORNING OF SURGERY IN SHORT STAY.   Do not eat food or drink liquids :After Midnight.     Take these medicines the morning of surgery with A SIP OF WATER: coreg                                SEE [facility] PREPARING FOR SURGERY SHEET             You may not have any metal on your body including hair pins and piercings  Do not wear jewelry, make-up.  Do not wear lotions, powders, or perfumes. You may wear deodorant.   Men may shave face and neck.  Do not bring valuables to the hospital. [facility] IS NOT RESPONSIBLE FOR VALUEABLES.  Contacts, dentures or bridgework may not be worn into surgery.  Leave suitcase in the car. After surgery it may be brought to your room.  For patients admitted to the hospital, checkout time is 11:00 AM the day of discharge.   Patients discharged the day of surgery will not be allowed to drive home.  Name and phone number of your driver:  Special Instructions: N/A   Please read over the following fact sheets that you were given: mrsa information, blood fact sheet  Call Cain Sieve RN pre op nurse if needed [phone]    FAILURE TO FOLLOW THESE INSTRUCTIONS MAY RESULT IN THE CANCELLATION OF YOUR SURGERY.  PATIENT SIGNATURE___________________________________________  NURSE SIGNATURE_____________________________________________

## 2012-11-25 ENCOUNTER — Encounter (HOSPITAL_COMMUNITY)
Admission: RE | Admit: 2012-11-25 | Discharge: 2012-11-25 | Disposition: A | Discharge status: Home / Self Care | Payer: 59 | Source: Ambulatory Visit | Attending: Orthopaedic Surgery | Admitting: Orthopaedic Surgery

## 2012-11-25 ENCOUNTER — Encounter (HOSPITAL_COMMUNITY): Payer: Self-pay

## 2012-11-25 ENCOUNTER — Ambulatory Visit (HOSPITAL_COMMUNITY)
Admission: RE | Admit: 2012-11-25 | Discharge: 2012-11-25 | Disposition: A | Discharge status: Home / Self Care | Payer: 59 | Source: Ambulatory Visit | Attending: Orthopaedic Surgery | Admitting: Orthopaedic Surgery

## 2012-11-25 DIAGNOSIS — Z01818 Encounter for other preprocedural examination: Secondary | ICD-10-CM | POA: Insufficient documentation

## 2012-11-25 DIAGNOSIS — Z01812 Encounter for preprocedural laboratory examination: Secondary | ICD-10-CM | POA: Insufficient documentation

## 2012-11-25 DIAGNOSIS — Z01810 Encounter for preprocedural cardiovascular examination: Secondary | ICD-10-CM | POA: Insufficient documentation

## 2012-11-25 DIAGNOSIS — M1710 Unilateral primary osteoarthritis, unspecified knee: Secondary | ICD-10-CM | POA: Insufficient documentation

## 2012-11-25 HISTORY — DX: Personal history of other diseases of urinary system: Z87.448

## 2012-11-25 LAB — BASIC METABOLIC PANEL
CO2: 28 mEq/L (ref 19–32)
Calcium: 9 mg/dL (ref 8.4–10.5)
Chloride: 101 mEq/L (ref 96–112)
Creatinine, Ser: 0.95 mg/dL (ref 0.50–1.35)
GFR calc Af Amer: >90 mL/min (ref >90)
GFR calc non Af Amer: >90 mL/min (ref >90)
Potassium: 4.3 mEq/L (ref 3.5–5.1)
Sodium: 137 mEq/L (ref 135–145)

## 2012-11-25 LAB — CBC
HCT: 33.3 % — ABNORMAL LOW (ref 39.0–52.0)
Hemoglobin: 10.7 g/dL — ABNORMAL LOW (ref 13.0–17.0)
MCV: 77.1 fL — ABNORMAL LOW (ref 78.0–100.0)
RBC: 4.32 MIL/uL (ref 4.22–5.81)
RDW: 14.3 % (ref 11.5–15.5)
WBC: 9.5 10*3/uL (ref 4.0–10.5)

## 2012-11-25 LAB — URINALYSIS, ROUTINE W REFLEX MICROSCOPIC
Bilirubin Urine: NEGATIVE
Leukocytes, UA: NEGATIVE
Specific Gravity, Urine: 1.014 (ref 1.005–1.030)
Urobilinogen, UA: 0.2 mg/dL (ref 0.0–1.0)
pH: 5.5 (ref 5.0–8.0)

## 2012-11-25 LAB — PROTIME-INR
INR: 1.06 (ref 0.00–1.49)
Prothrombin Time: 13.6 seconds (ref 11.6–15.2)

## 2012-11-25 LAB — SURGICAL PCR SCREEN: Staphylococcus aureus: POSITIVE — AB

## 2012-11-25 LAB — URINE MICROSCOPIC-ADD ON

## 2012-11-25 LAB — APTT: aPTT: 32 seconds (ref 24–37)

## 2012-11-25 NOTE — Progress Notes (Signed)
11/25/12 0813  OBSTRUCTIVE SLEEP APNEA  Have you ever been diagnosed with sleep apnea through a sleep study? No  Do you snore loudly (loud enough to be heard through closed doors)?  0  Do you often feel tired, fatigued, or sleepy during the daytime? 0  Has anyone observed you stop breathing during your sleep? 0  Do you have, or are you being treated for high blood pressure? 1  BMI more than 35 kg/m2? 1  Age over 57 years old? 1  Neck circumference greater than 40 cm/18 inches? 0  Gender: 1  Obstructive Sleep Apnea Score 4  Score 4 or greater  Results sent to PCP

## 2012-11-27 NOTE — Progress Notes (Signed)
Dr fortune saw 11-25-2012 ekg results, pt ok for surgery 11-29-2012 per dr fortune.

## 2012-11-28 ENCOUNTER — Encounter (HOSPITAL_COMMUNITY): Payer: Self-pay | Admitting: Anesthesiology

## 2012-11-28 NOTE — Anesthesia Preprocedure Evaluation (Addendum)
Anesthesia Evaluation  Patient identified by MRN, date of birth, ID band Patient awake    Reviewed: Allergy & Precautions, H&P , NPO status , Patient's Chart, lab work & pertinent test results  History of Anesthesia Complications (+) history of anesthetic complications  Airway Mallampati: II TM Distance: >3 FB Neck ROM: Full    Dental no notable dental hx.    Pulmonary neg pulmonary ROS,  breath sounds clear to auscultation  Pulmonary exam normal       Cardiovascular Exercise Tolerance: Good +CHF negative cardio ROS  + dysrhythmias + Valvular Problems/Murmurs AI Rhythm:Regular Rate:Normal  ECG 11-25-12 reviewed.  EF less than 50%   Neuro/Psych negative neurological ROS  negative psych ROS   GI/Hepatic hiatal hernia, GERD-  ,(+) Hepatitis -  Endo/Other  negative endocrine ROS  Renal/GU Renal disease  negative genitourinary   Musculoskeletal negative musculoskeletal ROS (+)   Abdominal (+) + obese,   Peds negative pediatric ROS (+)  Hematology negative hematology ROS (+)   Anesthesia Other Findings   Reproductive/Obstetrics negative OB ROS                        Anesthesia Physical Anesthesia Plan  ASA: III  Anesthesia Plan: General   Post-op Pain Management:    Induction: Intravenous  Airway Management Planned:   Additional Equipment:   Intra-op Plan:   Post-operative Plan: Extubation in OR  Informed Consent: I have reviewed the patients History and Physical, chart, labs and discussed the procedure including the risks, benefits and alternatives for the proposed anesthesia with the patient or authorized representative who has indicated his/her understanding and acceptance.   Dental advisory given  Plan Discussed with: CRNA  Anesthesia Plan Comments: (Discussed general with FNB versus spinal. He prefers GA with FNB.Discussed risks of femoral nerve block including failure,  bleeding, infection, nerve damage.  Femoral nerve block does not usually prevent all pain. Specifically, it treats the anterior, but often not the posterior knee. Questions answered.  Patient consents to block.)       Anesthesia Quick Evaluation

## 2012-11-29 ENCOUNTER — Inpatient Hospital Stay (HOSPITAL_COMMUNITY)
Admission: RE | Admit: 2012-11-29 | Discharge: 2012-12-02 | DRG: 470 | Disposition: A | Discharge status: Home Health | Payer: 59 | Source: Ambulatory Visit | Attending: Orthopaedic Surgery | Admitting: Orthopaedic Surgery

## 2012-11-29 ENCOUNTER — Encounter (HOSPITAL_COMMUNITY)
Admission: RE | Disposition: A | Discharge status: Home Health | Payer: Self-pay | Source: Ambulatory Visit | Attending: Orthopaedic Surgery

## 2012-11-29 ENCOUNTER — Inpatient Hospital Stay (HOSPITAL_COMMUNITY): Payer: 59 | Admitting: Anesthesiology

## 2012-11-29 ENCOUNTER — Encounter (HOSPITAL_COMMUNITY): Payer: Self-pay | Admitting: Registered Nurse

## 2012-11-29 ENCOUNTER — Encounter (HOSPITAL_COMMUNITY): Payer: 59 | Admitting: Anesthesiology

## 2012-11-29 ENCOUNTER — Inpatient Hospital Stay (HOSPITAL_COMMUNITY): Payer: 59

## 2012-11-29 DIAGNOSIS — M1710 Unilateral primary osteoarthritis, unspecified knee: Principal | ICD-10-CM | POA: Diagnosis present

## 2012-11-29 DIAGNOSIS — Z6831 Body mass index (BMI) 31.0-31.9, adult: Secondary | ICD-10-CM

## 2012-11-29 DIAGNOSIS — Z7982 Long term (current) use of aspirin: Secondary | ICD-10-CM

## 2012-11-29 DIAGNOSIS — E669 Obesity, unspecified: Secondary | ICD-10-CM | POA: Diagnosis present

## 2012-11-29 DIAGNOSIS — K219 Gastro-esophageal reflux disease without esophagitis: Secondary | ICD-10-CM | POA: Diagnosis present

## 2012-11-29 DIAGNOSIS — M169 Osteoarthritis of hip, unspecified: Secondary | ICD-10-CM | POA: Diagnosis present

## 2012-11-29 DIAGNOSIS — M6580 Other synovitis and tenosynovitis, unspecified site: Secondary | ICD-10-CM | POA: Diagnosis present

## 2012-11-29 DIAGNOSIS — Z87440 Personal history of urinary (tract) infections: Secondary | ICD-10-CM

## 2012-11-29 DIAGNOSIS — I509 Heart failure, unspecified: Secondary | ICD-10-CM | POA: Diagnosis present

## 2012-11-29 DIAGNOSIS — Z79899 Other long term (current) drug therapy: Secondary | ICD-10-CM

## 2012-11-29 DIAGNOSIS — I4891 Unspecified atrial fibrillation: Secondary | ICD-10-CM | POA: Diagnosis present

## 2012-11-29 DIAGNOSIS — Z96649 Presence of unspecified artificial hip joint: Secondary | ICD-10-CM

## 2012-11-29 DIAGNOSIS — R339 Retention of urine, unspecified: Secondary | ICD-10-CM

## 2012-11-29 DIAGNOSIS — M179 Osteoarthritis of knee, unspecified: Secondary | ICD-10-CM

## 2012-11-29 DIAGNOSIS — Z791 Long term (current) use of non-steroidal anti-inflammatories (NSAID): Secondary | ICD-10-CM

## 2012-11-29 DIAGNOSIS — D62 Acute posthemorrhagic anemia: Secondary | ICD-10-CM | POA: Diagnosis not present

## 2012-11-29 DIAGNOSIS — I359 Nonrheumatic aortic valve disorder, unspecified: Secondary | ICD-10-CM | POA: Diagnosis present

## 2012-11-29 DIAGNOSIS — M1610 Unilateral primary osteoarthritis, unspecified hip: Secondary | ICD-10-CM | POA: Diagnosis present

## 2012-11-29 DIAGNOSIS — N189 Chronic kidney disease, unspecified: Secondary | ICD-10-CM | POA: Diagnosis present

## 2012-11-29 DIAGNOSIS — I428 Other cardiomyopathies: Secondary | ICD-10-CM | POA: Diagnosis present

## 2012-11-29 HISTORY — PX: TOTAL KNEE ARTHROPLASTY: SHX125

## 2012-11-29 SURGERY — ARTHROPLASTY, KNEE, TOTAL
Anesthesia: General | Site: Knee | Laterality: Left | Wound class: Clean

## 2012-11-29 MED ORDER — ONDANSETRON HCL 4 MG/2ML IJ SOLN: 4.0000 mg | Freq: Four times a day (QID) | INTRAMUSCULAR | Status: DC | PRN

## 2012-11-29 MED ORDER — ACETAMINOPHEN 325 MG PO TABS: 650.0000 mg | ORAL_TABLET | Freq: Four times a day (QID) | ORAL | Status: DC | PRN

## 2012-11-29 MED ORDER — SODIUM CHLORIDE 0.9 % IJ SOLN: Freq: Once | INTRAMUSCULAR | Status: DC

## 2012-11-29 MED ORDER — HYDROMORPHONE HCL PF 1 MG/ML IJ SOLN
INTRAMUSCULAR | Status: AC
  Administered 2012-11-29: 11:00:00 0.5 mg
  Filled 2012-11-29: qty 1

## 2012-11-29 MED ORDER — OXYCODONE HCL ER 10 MG PO T12A
10.0000 mg | EXTENDED_RELEASE_TABLET | Freq: Two times a day (BID) | ORAL | Status: DC
  Administered 2012-11-29 – 2012-12-02 (×6): 10 mg via ORAL
  Filled 2012-11-29 (×6): qty 1

## 2012-11-29 MED ORDER — SODIUM CHLORIDE 0.9 % IJ SOLN
INTRAMUSCULAR | Status: AC
  Filled 2012-11-29: qty 50

## 2012-11-29 MED ORDER — SODIUM CHLORIDE 0.9 % IR SOLN
Status: DC | PRN
  Administered 2012-11-29: 250 mL

## 2012-11-29 MED ORDER — MENTHOL 3 MG MT LOZG
1.0000 | LOZENGE | OROMUCOSAL | Status: DC | PRN
  Filled 2012-11-29: qty 9

## 2012-11-29 MED ORDER — OXYCODONE HCL 5 MG PO TABS
5.0000 mg | ORAL_TABLET | ORAL | Status: DC | PRN
  Administered 2012-11-29 – 2012-12-02 (×8): 10 mg via ORAL
  Filled 2012-11-29 (×8): qty 2

## 2012-11-29 MED ORDER — HYDROMORPHONE HCL PF 1 MG/ML IJ SOLN: 0.2500 mg | INTRAMUSCULAR | Status: DC | PRN

## 2012-11-29 MED ORDER — PROPOFOL 10 MG/ML IV BOLUS
INTRAVENOUS | Status: DC | PRN
  Administered 2012-11-29: 200 mg via INTRAVENOUS

## 2012-11-29 MED ORDER — BUPIVACAINE LIPOSOME 1.3 % IJ SUSP
20.0000 mL | Freq: Once | INTRAMUSCULAR | Status: DC
  Filled 2012-11-29: qty 20

## 2012-11-29 MED ORDER — PHENOL 1.4 % MT LIQD: 1.0000 | OROMUCOSAL | Status: DC | PRN

## 2012-11-29 MED ORDER — HYDROMORPHONE HCL PF 1 MG/ML IJ SOLN
0.2500 mg | INTRAMUSCULAR | Status: DC | PRN
  Administered 2012-11-29 (×4): 0.5 mg via INTRAVENOUS

## 2012-11-29 MED ORDER — ALUM & MAG HYDROXIDE-SIMETH 200-200-20 MG/5ML PO SUSP: 30.0000 mL | ORAL | Status: DC | PRN

## 2012-11-29 MED ORDER — POTASSIUM CHLORIDE CRYS ER 20 MEQ PO TBCR
20.0000 meq | EXTENDED_RELEASE_TABLET | Freq: Two times a day (BID) | ORAL | Status: DC
  Administered 2012-11-29 – 2012-12-02 (×6): 20 meq via ORAL
  Filled 2012-11-29 (×7): qty 1

## 2012-11-29 MED ORDER — FUROSEMIDE 40 MG PO TABS
40.0000 mg | ORAL_TABLET | Freq: Two times a day (BID) | ORAL | Status: DC
  Administered 2012-11-29 – 2012-12-02 (×6): 40 mg via ORAL
  Filled 2012-11-29 (×8): qty 1

## 2012-11-29 MED ORDER — METOCLOPRAMIDE HCL 5 MG/ML IJ SOLN: 5.0000 mg | Freq: Three times a day (TID) | INTRAMUSCULAR | Status: DC | PRN

## 2012-11-29 MED ORDER — HYDROMORPHONE HCL PF 1 MG/ML IJ SOLN
INTRAMUSCULAR | Status: AC
  Administered 2012-11-29: 0.5 mg
  Filled 2012-11-29: qty 1

## 2012-11-29 MED ORDER — LIDOCAINE HCL (CARDIAC) 20 MG/ML IV SOLN
INTRAVENOUS | Status: DC | PRN
  Administered 2012-11-29: 80 mg via INTRAVENOUS

## 2012-11-29 MED ORDER — MIDAZOLAM HCL 5 MG/5ML IJ SOLN
INTRAMUSCULAR | Status: DC | PRN
  Administered 2012-11-29: 2 mg via INTRAVENOUS

## 2012-11-29 MED ORDER — METOCLOPRAMIDE HCL 10 MG PO TABS: 5.0000 mg | ORAL_TABLET | Freq: Three times a day (TID) | ORAL | Status: DC | PRN

## 2012-11-29 MED ORDER — ACETAMINOPHEN 325 MG PO TABS
ORAL_TABLET | ORAL | Status: AC
  Administered 2012-11-29: 650 mg
  Filled 2012-11-29: qty 2

## 2012-11-29 MED ORDER — SODIUM CHLORIDE 0.9 % IV SOLN
INTRAVENOUS | Status: DC
  Administered 2012-11-29: 16:00:00 via INTRAVENOUS

## 2012-11-29 MED ORDER — SODIUM CHLORIDE 0.9 % IR SOLN
Status: DC | PRN
  Administered 2012-11-29 (×2): 1000 mL

## 2012-11-29 MED ORDER — SODIUM CHLORIDE 0.9 % IJ SOLN
INTRAMUSCULAR | Status: DC | PRN
  Administered 2012-11-29: 09:00:00

## 2012-11-29 MED ORDER — FENTANYL CITRATE 0.05 MG/ML IJ SOLN
INTRAMUSCULAR | Status: DC | PRN
  Administered 2012-11-29 (×5): 50 ug via INTRAVENOUS

## 2012-11-29 MED ORDER — CEFAZOLIN SODIUM-DEXTROSE 2-3 GM-% IV SOLR
INTRAVENOUS | Status: AC
  Filled 2012-11-29: qty 50

## 2012-11-29 MED ORDER — MUPIROCIN 2 % EX OINT
TOPICAL_OINTMENT | Freq: Two times a day (BID) | CUTANEOUS | Status: AC
  Administered 2012-11-29 – 2012-11-30 (×2): via NASAL

## 2012-11-29 MED ORDER — CEFAZOLIN SODIUM 1-5 GM-% IV SOLN
1.0000 g | Freq: Four times a day (QID) | INTRAVENOUS | Status: AC
  Administered 2012-11-29 (×2): 1 g via INTRAVENOUS
  Filled 2012-11-29 (×2): qty 50

## 2012-11-29 MED ORDER — ACETAMINOPHEN 500 MG PO TABS: 500.0000 mg | ORAL_TABLET | Freq: Four times a day (QID) | ORAL | Status: DC | PRN

## 2012-11-29 MED ORDER — PHENYLEPHRINE HCL 10 MG/ML IJ SOLN
INTRAMUSCULAR | Status: DC | PRN
  Administered 2012-11-29 (×3): 80 ug via INTRAVENOUS
  Administered 2012-11-29: 40 ug via INTRAVENOUS
  Administered 2012-11-29: 80 ug via INTRAVENOUS
  Administered 2012-11-29: 40 ug via INTRAVENOUS
  Administered 2012-11-29 (×2): 80 ug via INTRAVENOUS

## 2012-11-29 MED ORDER — ROPIVACAINE HCL 5 MG/ML IJ SOLN
INTRAMUSCULAR | Status: AC
  Filled 2012-11-29: qty 30

## 2012-11-29 MED ORDER — CEFAZOLIN SODIUM-DEXTROSE 2-3 GM-% IV SOLR
2.0000 g | INTRAVENOUS | Status: AC
  Administered 2012-11-29: 2 g via INTRAVENOUS

## 2012-11-29 MED ORDER — ASPIRIN EC 325 MG PO TBEC
325.0000 mg | DELAYED_RELEASE_TABLET | Freq: Two times a day (BID) | ORAL | Status: DC
  Administered 2012-11-30 – 2012-12-02 (×5): 325 mg via ORAL
  Filled 2012-11-29 (×7): qty 1

## 2012-11-29 MED ORDER — ROCURONIUM BROMIDE 100 MG/10ML IV SOLN
INTRAVENOUS | Status: DC | PRN
  Administered 2012-11-29: 50 mg via INTRAVENOUS

## 2012-11-29 MED ORDER — ROPIVACAINE HCL 5 MG/ML IJ SOLN
INTRAMUSCULAR | Status: DC | PRN
  Administered 2012-11-29: 30 mL

## 2012-11-29 MED ORDER — ONDANSETRON HCL 4 MG/2ML IJ SOLN
INTRAMUSCULAR | Status: DC | PRN
  Administered 2012-11-29: 4 mg via INTRAMUSCULAR

## 2012-11-29 MED ORDER — HYDROMORPHONE HCL PF 1 MG/ML IJ SOLN
1.0000 mg | INTRAMUSCULAR | Status: DC | PRN
  Administered 2012-11-30: 02:00:00 1 mg via INTRAVENOUS
  Filled 2012-11-29: qty 1

## 2012-11-29 MED ORDER — ACETAMINOPHEN 650 MG RE SUPP: 650.0000 mg | Freq: Four times a day (QID) | RECTAL | Status: DC | PRN

## 2012-11-29 MED ORDER — GLYCOPYRROLATE 0.2 MG/ML IJ SOLN
INTRAMUSCULAR | Status: DC | PRN
  Administered 2012-11-29: .8 mg via INTRAVENOUS

## 2012-11-29 MED ORDER — NEOSTIGMINE METHYLSULFATE 1 MG/ML IJ SOLN
INTRAMUSCULAR | Status: DC | PRN
  Administered 2012-11-29: 5 mg via INTRAVENOUS

## 2012-11-29 MED ORDER — EPHEDRINE SULFATE 50 MG/ML IJ SOLN
INTRAMUSCULAR | Status: DC | PRN
  Administered 2012-11-29 (×3): 5 mg via INTRAVENOUS

## 2012-11-29 MED ORDER — HYDROMORPHONE HCL PF 1 MG/ML IJ SOLN
INTRAMUSCULAR | Status: AC
  Filled 2012-11-29: qty 1

## 2012-11-29 MED ORDER — FUROSEMIDE 10 MG/ML IJ SOLN
20.0000 mg | Freq: Once | INTRAMUSCULAR | Status: AC
  Administered 2012-11-29: 20 mg via INTRAVENOUS
  Filled 2012-11-29: qty 2

## 2012-11-29 MED ORDER — ONDANSETRON HCL 4 MG PO TABS: 4.0000 mg | ORAL_TABLET | Freq: Four times a day (QID) | ORAL | Status: DC | PRN

## 2012-11-29 MED ORDER — LACTATED RINGERS IV SOLN
INTRAVENOUS | Status: DC | PRN
  Administered 2012-11-29: 07:00:00 via INTRAVENOUS

## 2012-11-29 MED ORDER — PROMETHAZINE HCL 25 MG/ML IJ SOLN: 6.2500 mg | INTRAMUSCULAR | Status: DC | PRN

## 2012-11-29 MED ORDER — CARVEDILOL 25 MG PO TABS
25.0000 mg | ORAL_TABLET | Freq: Two times a day (BID) | ORAL | Status: DC
  Administered 2012-11-29 – 2012-12-02 (×6): 25 mg via ORAL
  Filled 2012-11-29 (×8): qty 1

## 2012-11-29 SURGICAL SUPPLY — 64 items
ADH SKN CLS APL DERMABOND .7 (GAUZE/BANDAGES/DRESSINGS) ×1
BAG SPEC THK2 15X12 ZIP CLS (MISCELLANEOUS) ×1
BAG ZIPLOCK 12X15 (MISCELLANEOUS) ×2 IMPLANT
BANDAGE ELASTIC 6 VELCRO ST LF (GAUZE/BANDAGES/DRESSINGS) ×3 IMPLANT
BANDAGE ESMARK 6X9 LF (GAUZE/BANDAGES/DRESSINGS) ×1 IMPLANT
BLADE HEX COATED 2.75 (ELECTRODE) ×1 IMPLANT
BLADE SAG 18X100X1.27 (BLADE) ×2 IMPLANT
BLADE SAW SGTL 13.0X1.19X90.0M (BLADE) IMPLANT
BNDG CMPR 9X6 STRL LF SNTH (GAUZE/BANDAGES/DRESSINGS) ×1
BNDG ESMARK 6X9 LF (GAUZE/BANDAGES/DRESSINGS) ×2
BOWL SMART MIX CTS (DISPOSABLE) ×2 IMPLANT
CEMENT BONE 1-PACK (Cement) ×4 IMPLANT
CLOTH BEACON ORANGE TIMEOUT ST (SAFETY) ×1 IMPLANT
CUFF TOURN SGL QUICK 34 (TOURNIQUET CUFF) ×2
CUFF TRNQT CYL 34X4X40X1 (TOURNIQUET CUFF) ×1 IMPLANT
DERMABOND ADVANCED (GAUZE/BANDAGES/DRESSINGS) ×1
DERMABOND ADVANCED .7 DNX12 (GAUZE/BANDAGES/DRESSINGS) ×1 IMPLANT
DRAPE EXTREMITY T 121X128X90 (DRAPE) ×2 IMPLANT
DRAPE INCISE IOBAN 66X45 STRL (DRAPES) ×1 IMPLANT
DRAPE LG THREE QUARTER DISP (DRAPES) IMPLANT
DRAPE POUCH INSTRU U-SHP 10X18 (DRAPES) ×2 IMPLANT
DRAPE U-SHAPE 47X51 STRL (DRAPES) ×2 IMPLANT
DRSG AQUACEL AG ADV 3.5X10 (GAUZE/BANDAGES/DRESSINGS) ×1 IMPLANT
DRSG PAD ABDOMINAL 8X10 ST (GAUZE/BANDAGES/DRESSINGS) ×2 IMPLANT
DRSG TEGADERM 4X4.75 (GAUZE/BANDAGES/DRESSINGS) ×2 IMPLANT
DURAPREP 26ML APPLICATOR (WOUND CARE) ×2 IMPLANT
ELECT REM PT RETURN 9FT ADLT (ELECTROSURGICAL) ×2
ELECTRODE REM PT RTRN 9FT ADLT (ELECTROSURGICAL) ×1 IMPLANT
EVACUATOR 1/8 PVC DRAIN (DRAIN) ×2 IMPLANT
FACESHIELD LNG OPTICON STERILE (SAFETY) ×10 IMPLANT
GAUZE SPONGE 2X2 8PLY STRL LF (GAUZE/BANDAGES/DRESSINGS) ×1 IMPLANT
GAUZE XEROFORM 5X9 LF (GAUZE/BANDAGES/DRESSINGS) ×1 IMPLANT
GLOVE BIO SURGEON STRL SZ7.5 (GLOVE) ×4 IMPLANT
GLOVE BIOGEL PI IND STRL 8 (GLOVE) ×2 IMPLANT
GLOVE BIOGEL PI INDICATOR 8 (GLOVE) ×2
GLOVE ECLIPSE 8.0 STRL XLNG CF (GLOVE) ×2 IMPLANT
GOWN STRL REIN XL XLG (GOWN DISPOSABLE) ×6 IMPLANT
HANDPIECE INTERPULSE COAX TIP (DISPOSABLE) ×2
IMMOBILIZER KNEE 20 (SOFTGOODS) ×2
IMMOBILIZER KNEE 20 THIGH 36 (SOFTGOODS) ×1 IMPLANT
KIT BASIN OR (CUSTOM PROCEDURE TRAY) ×2 IMPLANT
KNEE/VIT E POLY LINER LEVEL 1B ×1 IMPLANT
NS IRRIG 1000ML POUR BTL (IV SOLUTION) ×2 IMPLANT
PACK TOTAL JOINT (CUSTOM PROCEDURE TRAY) ×2 IMPLANT
PADDING CAST COTTON 6X4 STRL (CAST SUPPLIES) ×4 IMPLANT
POSITIONER SURGICAL ARM (MISCELLANEOUS) ×2 IMPLANT
SET HNDPC FAN SPRY TIP SCT (DISPOSABLE) ×1 IMPLANT
SET PAD KNEE POSITIONER (MISCELLANEOUS) ×2 IMPLANT
SPONGE GAUZE 2X2 STER 10/PKG (GAUZE/BANDAGES/DRESSINGS) ×1
SPONGE GAUZE 4X4 12PLY (GAUZE/BANDAGES/DRESSINGS) ×1 IMPLANT
STAPLER VISISTAT 35W (STAPLE) ×3 IMPLANT
SUCTION FRAZIER 12FR DISP (SUCTIONS) ×2 IMPLANT
SUT MNCRL AB 4-0 PS2 18 (SUTURE) ×2 IMPLANT
SUT VIC AB 0 CT1 27 (SUTURE) ×4
SUT VIC AB 0 CT1 27XBRD ANTBC (SUTURE) ×2 IMPLANT
SUT VIC AB 1 CT1 27 (SUTURE) ×6
SUT VIC AB 1 CT1 27XBRD ANTBC (SUTURE) ×3 IMPLANT
SUT VIC AB 2-0 CT1 27 (SUTURE) ×4
SUT VIC AB 2-0 CT1 TAPERPNT 27 (SUTURE) ×2 IMPLANT
TOWEL OR 17X26 10 PK STRL BLUE (TOWEL DISPOSABLE) ×4 IMPLANT
TOWEL OR NON WOVEN STRL DISP B (DISPOSABLE) ×2 IMPLANT
TRAY FOLEY CATH 14FRSI W/METER (CATHETERS) ×2 IMPLANT
WATER STERILE IRR 1500ML POUR (IV SOLUTION) ×2 IMPLANT
WRAP KNEE MAXI GEL POST OP (GAUZE/BANDAGES/DRESSINGS) ×2 IMPLANT

## 2012-11-29 NOTE — Brief Op Note (Signed)
11/29/2012  9:29 AM  PATIENT:  Terry [name]  57 y.o. male  PRE-OPERATIVE DIAGNOSIS:  Severe endstage arthritis left knee  POST-OPERATIVE DIAGNOSIS:  Severe endstage arthritis left knee  PROCEDURE:  Procedure(s) with comments: LEFT TOTAL KNEE ARTHROPLASTY (Left) - FEMORAL NERVE BLOCK IN HOLDING AREA LEFT LEG  SURGEON:  Surgeon(s) and Role:    * [name], MD - Primary    * Naiping Glee Arvin, MD - Assisting  ASSISTANTS: none   ANESTHESIA:   regional and general  EBL:  Total I/O In: -  Out: 550 [Urine:500; Blood:50]  BLOOD ADMINISTERED:none  LOCAL MEDICATIONS USED:  Experil  SPECIMEN:  No Specimen  DISPOSITION OF SPECIMEN:  N/A  COUNTS:  YES  TOURNIQUET:  < 90 minutes  DICTATION: .Other Dictation: Dictation Number [id number]  PLAN OF CARE: Admit to inpatient   PATIENT DISPOSITION:  PACU - hemodynamically stable.   Delay start of Pharmacological VTE agent (>24hrs) due to surgical blood loss or risk of bleeding: no

## 2012-11-29 NOTE — Op Note (Signed)
Terry [name], Terry [name]                  ACCOUNT NO.:  [id number]  MEDICAL RECORD NO.:  [id number]  LOCATION:  [facility]                         FACILITY:  [facility]  PHYSICIAN:  Vanita Panda. Magnus Ivan, M.D.DATE OF BIRTH:  12-27-1955  DATE OF PROCEDURE:  11/29/2012 DATE OF DISCHARGE:                              OPERATIVE REPORT   PREOPERATIVE DIAGNOSES:  Severe end-stage arthritis and degenerative joint disease, left knee.  POSTOPERATIVE DIAGNOSES:  Severe end-stage arthritis and degenerative joint disease, left knee.  PROCEDURE:  Left total knee arthroplasty.  IMPLANTS:  Stryker Triathlon knee with size 5 femur, size 6 tibial tray, 9-mm polyethylene insert, size 32 patellar button.  SURGEON:  Vanita Panda. Magnus Ivan, M.D.  ASSISTANT SURGEON:  Glee Arvin MD.  ANESTHESIA: 1. Left leg femoral nerve block 2. General.  TOURNIQUET TIME:  Under an hour and a half.  BLOOD LOSS:  Less than 100 mL.  COMPLICATIONS:  None.  ANTIBIOTICS:  IV Ancef.  INDICATIONS:  Terry [name] is a 57 year old male known to me.  He has severe debilitating end-stage arthritis of his left knee.  He has a known varus deformity with almost complete loss of the entire joint space.  He has had significant synovitis of that knee and sclerotic changes.  He has failed conservative treatment with multiple injections, and to the point where his quality of life is, and mobility greatly diminished, pain is daily, and he wishes to proceed with a total knee arthroplasty.  He also failed conservative treatment modalities as well.  He understands the risks of surgery including acute blood loss anemia, nerve and vessel injury, infection DVT.  He understands the goals are to decrease pain and improved mobility and overall improved quality of life.  PROCEDURE IN DESCRIPTION:  After informed consent was obtained, appropriate left knee was marked.  Anesthesia was obtained in the regional femoral nerve block.  He was then brought to  the operating room and placed on the operative table.  General anesthesia was then obtained.  A Foley catheter was placed.  A nonsterile tourniquet was placed around his upper left thigh.  His left leg was then prepped and draped with DuraPrep and sterile drapes.  A time-out was called to identify correct patient, correct left knee.  I then used an Esmarch to wrap out the leg and tourniquet was inflated to 300 mm of pressure.  I made a midline incision over the patella and carried this proximally and distally.  I then performed a medial parapatellar arthrotomy and found a large effusion in his knee with significant synovitis.  I cleaned the synovium throughout his knee as much as I could, and removed osteophytes from his knee.  I then removed the remnants of the medial and lateral meniscus as well as ACL and PCL.  Next, with the knee flexed, we used our external tibial cutting guide and set this to take 9 mm off the high side, correcting to neutral slope and varus valgus.  I then made my tibial cut.  Attention was then turned to the femur.  We then drilled in the intercondylar area for placing a intramedullary femoral cutting guide.  We set this for 5 degrees  left and taking 10 mm cuts.  We then made our distal femoral cut, followed the knee back in extension with a 9-mm extension block.  I was very pleased with the neutral extension of his knee, and it was not tight medially and laterally.  We then went back to the femur again and then put a femoral sizing guide, based on the epicondylar axis.  We chose a size 5 femur, so I made my anterior and posterior cuts.  After we trialed for a size femur,  with the guide off and put the size 5/4 in 1 cutting block.  We made our anterior and posterior cuts followed by our chamfer cuts.  I then made my femoral box cut next.  After this, we were back to the tibia and we trialed a size 6 tibial plate.  I was pleased with the position of this and made  our keel cut off of this.  We then placed all trial components.  The size 5 femur, the size 6 tibia, 9-mm polyethylene insert, and a size 32 patellar button in which we drilled lugs for as well.  With all trial components in place, with the knee through range of motion, I was very pleased with the stability.  We then removed all trial components and copiously irrigated the knee with normal saline solution using pulsatile lavage.  We then placed a mixture of 20 mL of Exparel, next 40 mL of normal saline throughout the knee joint capsule.  We then cemented the real size 6 tibia tray followed by the real size 5 femur.  We placed the real 9-mm polyethylene insert and cemented the size 32 patellar button. Once the cement had dried, we removed the cement remnants with the tourniquet down and hemostasis was obtained with electrocautery.  I placed a medium Hemovac in the arthrotomy.  We then copiously irrigated the arthrotomy again with normal saline solution and then closed the arthrotomy with interrupted #1 Vicryl followed by 0 Vicryl in the  deep tissue, 2-0 Vicryl in subcutaneous tissue, and staples on the skin. Xeroform, a well-padded sterile dressing was applied.  He was placed in a knee immobilizer.  He was awakened, extubated, and taken to the recovery room in stable condition.  All final counts were correct and no complications noted.     Vanita Panda. Magnus Ivan, M.D.     CYB/MEDQ  D:  11/29/2012  T:  11/29/2012  Job:  [id number]

## 2012-11-29 NOTE — Evaluation (Signed)
Physical Therapy Evaluation Patient Details Name: Terry [name] MRN: [id number] DOB: January 05, 1956 Today's Date: 11/29/2012 Time: 4098-1191 PT Time Calculation (min): 29 min  PT Assessment / Plan / Recommendation History of Present Illness     Clinical Impression  Pt s/p L TKR presents with decreased L LE strength/ROM and post op pain limiting functional mobility.  Pt should progress well to d/c home with family assist and HHPT follow up    PT Assessment  Patient needs continued PT services    Follow Up Recommendations  Home health PT    Does the patient have the potential to tolerate intense rehabilitation      Barriers to Discharge        Equipment Recommendations  None recommended by PT    Recommendations for Other Services OT consult   Frequency 7X/week    Precautions / Restrictions Precautions Precautions: Fall;Knee Required Braces or Orthoses: Knee Immobilizer - Left Knee Immobilizer - Left: Discontinue once straight leg raise with < 10 degree lag Restrictions Weight Bearing Restrictions: No Other Position/Activity Restrictions: WBAT   Pertinent Vitals/Pain 5/10; premed, icepacks provided      Mobility  Bed Mobility Bed Mobility: Supine to Sit;Sit to Supine Supine to Sit: 3: Mod assist Sit to Supine: 3: Mod assist Details for Bed Mobility Assistance: cues for sequence and use of UEs to self assist Transfers Transfers: Sit to Stand;Stand to Sit Sit to Stand: 1: +2 Total assist;From bed Sit to Stand: Patient Percentage: 60% Stand to Sit: 1: +2 Total assist;To bed Stand to Sit: Patient Percentage: 60% Details for Transfer Assistance: cues for LE management and use of UEs to self assist Ambulation/Gait Ambulation/Gait Assistance: 1: +2 Total assist Ambulation/Gait: Patient Percentage: 70% Ambulation Distance (Feet): 12 Feet Assistive device: Rolling walker Ambulation/Gait Assistance Details: cues for sequence, posture, position from RW and stride  length Gait Pattern: Step-to pattern;Decreased step length - right;Decreased step length - left;Shuffle;Trunk flexed    Exercises Total Joint Exercises Ankle Circles/Pumps: AROM;10 reps;Supine;Both Quad Sets: AROM;10 reps;Both;Supine Heel Slides: 5 reps;AAROM;Supine;Left Straight Leg Raises: AAROM;Left;10 reps;Supine   PT Diagnosis: Difficulty walking  PT Problem List: Decreased strength;Decreased range of motion;Decreased activity tolerance;Decreased mobility;Decreased knowledge of use of DME;Pain;Decreased knowledge of precautions PT Treatment Interventions: DME instruction;Gait training;Stair training;Therapeutic exercise;Therapeutic activities;Functional mobility training;Patient/family education     PT Goals(Current goals can be found in the care plan section) Acute Rehab PT Goals Patient Stated Goal: Resume previous lifestyle with decreased pain PT Goal Formulation: With patient Time For Goal Achievement: 12/06/12 Potential to Achieve Goals: Good  Visit Information  Last PT Received On: 11/29/12 Assistance Needed: +2       Prior Functioning  Home Living Family/patient expects to be discharged to:: Private residence Living Arrangements: Spouse/significant other Available Help at Discharge: Family Type of Home: House Home Access: Stairs to enter Secretary/administrator of Steps: 5 Entrance Stairs-Rails: Right;Left Home Layout: Able to live on main level with bedroom/bathroom Home Equipment: Walker - 2 wheels;Cane - single point;Crutches Prior Function Level of Independence: Independent Communication Communication: No difficulties    Cognition  Cognition Arousal/Alertness: Awake/alert Behavior During Therapy: WFL for tasks assessed/performed Overall Cognitive Status: Within Functional Limits for tasks assessed    Extremity/Trunk Assessment Upper Extremity Assessment Upper Extremity Assessment: Overall WFL for tasks assessed Lower Extremity Assessment Lower  Extremity Assessment: LLE deficits/detail LLE Deficits / Details: 2/5 quads with AAROM at knee -10 - 40   Balance    End of Session PT - End of Session Equipment Utilized  During Treatment: Gait belt;Left knee immobilizer Activity Tolerance: Patient tolerated treatment well;Patient limited by fatigue Patient left: in bed;with call bell/phone within reach;with family/visitor present Nurse Communication: Mobility status CPM Left Knee CPM Left Knee: Off  GP     Snigdha Howser 11/29/2012, 3:42 PM

## 2012-11-29 NOTE — H&P (Signed)
TOTAL KNEE ADMISSION H&P  Patient is being admitted for left total knee arthroplasty.  Subjective:  Chief Complaint:left knee pain.  HPI: Terry [name], 57 y.o. male, has a history of pain and functional disability in the left knee due to arthritis and has failed non-surgical conservative treatments for greater than 12 weeks to includeNSAID's and/or analgesics, corticosteriod injections, use of assistive devices, weight reduction as appropriate and activity modification.  Onset of symptoms was gradual, starting 2 years ago with gradually worsening course since that time. The patient noted no past surgery on the left knee(s).  Patient currently rates pain in the left knee(s) at 8 out of 10 with activity. Patient has worsening of pain with activity and weight bearing, pain that interferes with activities of daily living, pain with passive range of motion, crepitus and joint swelling.  Patient has evidence of subchondral sclerosis, periarticular osteophytes and joint space narrowing by imaging studies. There is no active infection.  Patient Active Problem List   Diagnosis Date Noted  . Degenerative arthritis of left knee 11/29/2012  . Degenerative arthritis of hip 09/08/2011   Past Medical History  Diagnosis Date  . Complication of anesthesia     hx of irregular beat after anesthesia > 20 yrs ago   . Chronic kidney disease     hx of uti   . GERD (gastroesophageal reflux disease)   . H/O hiatal hernia   . Arthritis     rheumatoid  . Hepatitis     hx of as a child   . CHF (congestive heart failure)     nonischemic dilated cardiomyopathy  . Aortic insufficiency     mild   . Ejection fraction < 50%     by echo 08/25/11   . H/O hematuria   . Dysrhythmia 2013    atrial fib     Past Surgical History  Procedure Laterality Date  . Deviatede septum,    . Urinary stricture     . 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 Colonoscopy      x2  . Total hip arthroplasty   09/08/2011    Procedure: TOTAL HIP ARTHROPLASTY ANTERIOR APPROACH;  Surgeon: [name], MD;  Location: [facility];  Service: Orthopedics;  Laterality: Right;  Right total hip replacement, left knee steroid injection    Prescriptions prior to admission  Medication Sig Dispense Refill  . aspirin EC 81 MG tablet Take 81 mg by mouth every morning.      . carvedilol (COREG) 25 MG tablet Take 25 mg by mouth 2 (two) times daily with a meal.      . furosemide (LASIX) 40 MG tablet Take 40 mg by mouth 2 (two) times daily.      Marland Kitchen ibuprofen (ADVIL,MOTRIN) 200 MG tablet Take 200 mg by mouth every 6 (six) hours as needed for pain.      . naproxen sodium (ANAPROX) 220 MG tablet Take 220 mg by mouth 2 (two) times daily as needed (pain).       . potassium chloride SA (K-DUR,KLOR-CON) 20 MEQ tablet Take 20 mEq by mouth 2 (two) times daily.      . ramipril (ALTACE) 2.5 MG capsule Take 2.5 mg by mouth daily with breakfast.      . ramipril (ALTACE) 5 MG capsule Take 5 mg by mouth daily with breakfast.      . traMADol (ULTRAM) 50 MG tablet Take 50 mg  by mouth every 6 (six) hours as needed. pain       Allergies  Allergen Reactions  . Sulfa Drugs Cross Reactors Hives and Other (See Comments)    thrush    History  Substance Use Topics  . Smoking status: Never Smoker   . Smokeless tobacco: Never Used  . Alcohol Use: 0.6 oz/week    1 Cans of beer per week     Comment: 1 beer every 2 weeks    No family history on file.   Review of Systems  Musculoskeletal: Positive for joint pain.  All other systems reviewed and are negative.    Objective:  Physical Exam  Constitutional: He is oriented to person, place, and time. He appears well-developed and well-nourished.  HENT:  Head: Normocephalic and atraumatic.  Eyes: EOM are normal. Pupils are equal, round, and reactive to light.  Neck: Normal range of motion. Neck supple.  Cardiovascular: Normal rate and regular rhythm.   Respiratory: Effort normal  and breath sounds normal.  GI: Soft. Bowel sounds are normal.  Musculoskeletal:       Left knee: He exhibits decreased range of motion, effusion and abnormal alignment. Tenderness found. Medial joint line tenderness noted.  Neurological: He is alert and oriented to person, place, and time.  Skin: Skin is warm and dry.  Psychiatric: He has a normal mood and affect.    Vital signs in last 24 hours: Temp:  [97.3 F (36.3 C)] 97.3 F (36.3 C) (10/17 0554) Pulse Rate:  [71] 71 (10/17 0554) Resp:  [16] 16 (10/17 0554) BP: (114)/(71) 114/71 mmHg (10/17 0554) SpO2:  [98 %] 98 % (10/17 0554)  Labs:   Estimated body mass index is 30.08 kg/(m^2) as calculated from the following:   Height as of 11/25/12: 5\' 11"  (1.803 m).   Weight as of 09/01/11: 97.796 kg (215 lb 9.6 oz).   Imaging Review Plain radiographs demonstrate severe degenerative joint disease of the left knee(s). The overall alignment ismild varus. The bone quality appears to be good for age and reported activity level.  Assessment/Plan:  End stage arthritis, left knee   The patient history, physical examination, clinical judgment of the provider and imaging studies are consistent with end stage degenerative joint disease of the left knee(s) and total knee arthroplasty is deemed medically necessary. The treatment options including medical management, injection therapy arthroscopy and arthroplasty were discussed at length. The risks and benefits of total knee arthroplasty were presented and reviewed. The risks due to aseptic loosening, infection, stiffness, patella tracking problems, thromboembolic complications and other imponderables were discussed. The patient acknowledged the explanation, agreed to proceed with the plan and consent was signed. Patient is being admitted for inpatient treatment for surgery, pain control, PT, OT, prophylactic antibiotics, VTE prophylaxis, progressive ambulation and ADL's and discharge planning. The  patient is planning to be discharged home with home health services

## 2012-11-29 NOTE — Care Management Note (Addendum)
    Page 1 of 2   12/02/2012     11:32:08 AM   CARE MANAGEMENT NOTE 12/02/2012  Patient:  Terry [name], Terry [name]   Account Number:  [id number]  Date Initiated:  11/29/2012  Documentation initiated by:  Colleen Can  Subjective/Objective Assessment:   dx Left total knee arthroplasty     Action/Plan:   CM spoke with patient and spouse. Plans are for patient to return to his home where spouse will be caregiver. He already has RW, elevated toilet seat, and shower seat. He wants [facility] for [facility] services.   Anticipated DC Date:  12/02/2012   Anticipated DC Plan:  HOME W HOME HEALTH SERVICES      DC Planning Services  CM consult      [facility]  HOME HEALTH   Choice offered to / List presented to:  C-1 Patient        HH arranged  HH-2 PT      [facility] agency  [facility]   Status of service:  Completed, signed off Medicare Important Message given?   (If response is "NO", the following Medicare IM given date fields will be blank) Date Medicare IM given:   Date Additional Medicare IM given:    Discharge Disposition:  HOME W HOME HEALTH SERVICES  Per UR Regulation:  Reviewed for med. necessity/level of care/duration of stay  If discussed at Long Length of Stay Meetings, dates discussed:    Comments:  12/02/2012  Colleen Can BSN RN CCM [phone] PLANS [facility] PATIENT TO DISCHARGE TODAY WITH [facility] IN PLACE. SERVICES WILLL START TOMORROW.  11/29/2012 Colleen Can BSN RN CCM [phone] TCT [facility] rep who advised that they can provide services for HHPT.

## 2012-11-29 NOTE — Preoperative (Signed)
Beta Blockers   Reason not to administer Beta Blockers:Coreg taken 0430 11-29-12

## 2012-11-29 NOTE — Anesthesia Postprocedure Evaluation (Signed)
  Anesthesia Post-op Note  Patient: Terry [name]  Procedure(s) Performed: Procedure(s) (LRB): LEFT TOTAL KNEE ARTHROPLASTY (Left)  Patient Location: PACU  Anesthesia Type: GA combined with regional for post-op pain  Level of Consciousness: awake and alert   Airway and Oxygen Therapy: Patient Spontanous Breathing  Post-op Pain: mild  Post-op Assessment: Post-op Vital signs reviewed, Patient's Cardiovascular Status Stable, Respiratory Function Stable, Patent Airway and No signs of Nausea or vomiting  Last Vitals:  Filed Vitals:   11/29/12 1127  BP: 106/71  Pulse: 54  Temp: 36.9 C  Resp: 15    Post-op Vital Signs: stable   Complications: No apparent anesthesia complications

## 2012-11-29 NOTE — Anesthesia Procedure Notes (Signed)
Anesthesia Regional Block:  Femoral nerve block  Pre-Anesthetic Checklist: ,, timeout performed, Correct Patient, Correct Site, Correct Laterality, Correct Procedure, Correct Position, site marked, Risks and benefits discussed,  Surgical consent,  Pre-op evaluation,  At surgeon's request and post-op pain management  Laterality: Lower and Left  Prep: chloraprep       Needles:  Injection technique: Single-shot  Needle Type: Stimiplex      Needle Gauge: 21 and 21 G    Additional Needles:  Procedures: ultrasound guided (picture in chart) and nerve stimulator Femoral nerve block  Nerve Stimulator or Paresthesia:  Response: quadriceps left, 0.6 mA,   Additional Responses:   Narrative:   Performed by: Personally  Anesthesiologist: Brendalyn Vallely  Additional Notes: No pain on injection. No increased resistance to injection. Motor intact immediately after block. Loss of quadriceps strength at 20 minutes.   Femoral nerve block

## 2012-11-29 NOTE — Transfer of Care (Signed)
Immediate Anesthesia Transfer of Care Note  Patient: Terry [name]  Procedure(s) Performed: Procedure(s) with comments: LEFT TOTAL KNEE ARTHROPLASTY (Left) - FEMORAL NERVE BLOCK IN HOLDING AREA LEFT LEG  Patient Location: PACU  Anesthesia Type:General  Level of Consciousness: awake, alert , oriented and patient cooperative  Airway & Oxygen Therapy: Patient Spontanous Breathing and Patient connected to face mask oxygen  Post-op Assessment: Report given to PACU RN, Post -op Vital signs reviewed and stable and Patient moving all extremities  Post vital signs: Reviewed and stable  Complications: No apparent anesthesia complications

## 2012-11-30 LAB — CBC
MCHC: 32 g/dL (ref 30.0–36.0)
RDW: 14.2 % (ref 11.5–15.5)
WBC: 8.7 10*3/uL (ref 4.0–10.5)

## 2012-11-30 LAB — BASIC METABOLIC PANEL
BUN: 14 mg/dL (ref 6–23)
Calcium: 8.1 mg/dL — ABNORMAL LOW (ref 8.4–10.5)
Chloride: 93 mEq/L — ABNORMAL LOW (ref 96–112)
GFR calc Af Amer: 78 mL/min — ABNORMAL LOW (ref >90)
GFR calc non Af Amer: 68 mL/min — ABNORMAL LOW (ref >90)
Potassium: 4.2 mEq/L (ref 3.5–5.1)
Sodium: 130 mEq/L — ABNORMAL LOW (ref 135–145)

## 2012-11-30 MED ORDER — POLYETHYLENE GLYCOL 3350 17 G PO PACK
17.0000 g | PACK | Freq: Every day | ORAL | Status: DC
  Administered 2012-11-30 – 2012-12-02 (×3): 17 g via ORAL

## 2012-11-30 NOTE — Progress Notes (Signed)
Pt not yet able to void. Order obtained from Dr Ophelia Charter for coude catheter. Pt wants to try a little longer. Yamil Oelke, [employer]

## 2012-11-30 NOTE — Progress Notes (Signed)
Pt still unable to void. Bladder scanned for 348 cc. Contacted Cardell Peach, RN, CN of 4th floor (telemetry/urology) to put in coude catheter. Anthonella Klausner, [employer]

## 2012-11-30 NOTE — Progress Notes (Signed)
Subjective: 1 Day Post-Op Procedure(s) (LRB): LEFT TOTAL KNEE ARTHROPLASTY (Left) Patient reports pain as 6 on 0-10 scale.    Objective: Vital signs in last 24 hours: Temp:  [97.3 F (36.3 C)-99.1 F (37.3 C)] 97.9 F (36.6 C) (10/18 0620) Pulse Rate:  [54-99] 85 (10/18 0620) Resp:  [10-18] 16 (10/18 0620) BP: (97-132)/(69-84) 115/76 mmHg (10/18 0620) SpO2:  [99 %-100 %] 99 % (10/18 0620) Weight:  [102.513 kg (226 lb)] 102.513 kg (226 lb) (10/17 1220)  Intake/Output from previous day: 10/17 0701 - 10/18 0700 In: 2790 [P.O.:840; I.V.:1850; IV Piggyback:100] Out: 2350 [Urine:2050; Drains:250; Blood:50] Intake/Output this shift: Total I/O In: 240 [P.O.:240] Out: 250 [Urine:250]   Recent Labs  11/30/12 0450  HGB 8.2*    Recent Labs  11/30/12 0450  WBC 8.7  RBC 3.34*  HCT 25.6*  PLT 268    Recent Labs  11/30/12 0450  NA 130*  K 4.2  CL 93*  CO2 28  BUN 14  CREATININE 1.17  GLUCOSE 138*  CALCIUM 8.1*   No results found for this basename: LABPT, INR,  in the last 72 hours  Neurologically intact  Assessment/Plan: 1 Day Post-Op Procedure(s) (LRB): LEFT TOTAL KNEE ARTHROPLASTY (Left) Advance diet,  Saline lock IV  Marteze Vecchio C 11/30/2012, 9:43 AM

## 2012-11-30 NOTE — Progress Notes (Signed)
Physical Therapy Treatment Patient Details Name: Terry [name] MRN: [id number] DOB: [birth year] Today's Date: 11/30/2012 Time: 1352-1440 PT Time Calculation (min): 48 min  PT Assessment / Plan / Recommendation  History of Present Illness Pt is s/p L TKA   PT Comments   Pt with difficulty voiding bladder - attempted in standing at start and end of session as well as in sitting but with no success - RN aware.  Follow Up Recommendations  Home health PT     Does the patient have the potential to tolerate intense rehabilitation     Barriers to Discharge        Equipment Recommendations  None recommended by PT    Recommendations for Other Services OT consult  Frequency 7X/week   Progress towards PT Goals Progress towards PT goals: Progressing toward goals  Plan Current plan remains appropriate    Precautions / Restrictions Precautions Precautions: Fall;Knee Required Braces or Orthoses: Knee Immobilizer - Left Knee Immobilizer - Left: Discontinue once straight leg raise with < 10 degree lag Restrictions Weight Bearing Restrictions: No Other Position/Activity Restrictions: WBAT   Pertinent Vitals/Pain 3/10    Mobility  Bed Mobility Bed Mobility: Sit to Supine Sit to Supine: 4: Min assist Details for Bed Mobility Assistance: cues for sequence and use of UEs to self assist Transfers Transfers: Sit to Stand;Stand to Sit Sit to Stand: 4: Min assist;With upper extremity assist;From chair/3-in-1 Stand to Sit: 4: Min assist Details for Transfer Assistance: cues for LE management and use of UEs to self assist Ambulation/Gait Ambulation/Gait Assistance: 4: Min assist Ambulation Distance (Feet): 159 Feet Assistive device: Rolling walker Ambulation/Gait Assistance Details: cues for stride length, posture and position from RW Gait Pattern: Step-to pattern;Shuffle;Trunk flexed Gait velocity: decr    Exercises     PT Diagnosis:    PT Problem List:   PT Treatment Interventions:      PT Goals (current goals can now be found in the care plan section) Acute Rehab PT Goals Patient Stated Goal: Resume previous lifestyle with decreased pain PT Goal Formulation: With patient Time For Goal Achievement: 12/06/12 Potential to Achieve Goals: Good  Visit Information  Last PT Received On: 11/30/12 Assistance Needed: +1 History of Present Illness: Pt is s/p L TKA    Subjective Data  Patient Stated Goal: Resume previous lifestyle with decreased pain   Cognition  Cognition Arousal/Alertness: Awake/alert Behavior During Therapy: WFL for tasks assessed/performed Overall Cognitive Status: Within Functional Limits for tasks assessed    Balance  Balance Balance Assessed: Yes Dynamic Standing Balance Dynamic Standing - Level of Assistance: 4: Min assist  End of Session PT - End of Session Equipment Utilized During Treatment: Gait belt;Left knee immobilizer Activity Tolerance: Patient tolerated treatment well Patient left: in bed;with call bell/phone within reach;with family/visitor present Nurse Communication: Mobility status   GP     Hillary Schwegler 11/30/2012, 4:12 PM

## 2012-11-30 NOTE — Progress Notes (Signed)
Physical Therapy Treatment Patient Details Name: Terry [name] MRN: [id number] DOB: August 28, 1955 Today's Date: 11/30/2012 Time: 4098-1191 PT Time Calculation (min): 31 min  PT Assessment / Plan / Recommendation  History of Present Illness     PT Comments     Follow Up Recommendations  Home health PT     Does the patient have the potential to tolerate intense rehabilitation     Barriers to Discharge        Equipment Recommendations  None recommended by PT    Recommendations for Other Services OT consult  Frequency 7X/week   Progress towards PT Goals Progress towards PT goals: Progressing toward goals  Plan Current plan remains appropriate    Precautions / Restrictions Precautions Precautions: Fall;Knee Required Braces or Orthoses: Knee Immobilizer - Left Knee Immobilizer - Left: Discontinue once straight leg raise with < 10 degree lag Restrictions Weight Bearing Restrictions: No Other Position/Activity Restrictions: WBAT   Pertinent Vitals/Pain 5/10; premed, cold packs provided.    Mobility  Bed Mobility Bed Mobility: Supine to Sit Supine to Sit: 3: Mod assist Details for Bed Mobility Assistance: cues for sequence and use of UEs to self assist Transfers Transfers: Sit to Stand;Stand to Sit Sit to Stand: 3: Mod assist Stand to Sit: 4: Min assist;3: Mod assist Details for Transfer Assistance: cues for LE management and use of UEs to self assist Ambulation/Gait Ambulation/Gait Assistance: 3: Mod assist Ambulation Distance (Feet): 50 Feet Assistive device: Rolling walker Ambulation/Gait Assistance Details: cues for sequence, posture, position from RW and stride length Gait Pattern: Step-to pattern;Decreased step length - right;Decreased step length - left;Shuffle;Trunk flexed    Exercises Total Joint Exercises Ankle Circles/Pumps: AROM;10 reps;Supine;Both Quad Sets: AROM;10 reps;Both;Supine Heel Slides: AAROM;Supine;Left;10 reps Straight Leg Raises: AAROM;Left;10  reps;Supine Goniometric ROM: AAROM at knee -10 - 50   PT Diagnosis:    PT Problem List:   PT Treatment Interventions:     PT Goals (current goals can now be found in the care plan section) Acute Rehab PT Goals Patient Stated Goal: Resume previous lifestyle with decreased pain PT Goal Formulation: With patient Time For Goal Achievement: 12/06/12 Potential to Achieve Goals: Good  Visit Information  Last PT Received On: 11/30/12 Assistance Needed: +1    Subjective Data  Patient Stated Goal: Resume previous lifestyle with decreased pain   Cognition  Cognition Arousal/Alertness: Awake/alert Behavior During Therapy: WFL for tasks assessed/performed Overall Cognitive Status: Within Functional Limits for tasks assessed    Balance     End of Session PT - End of Session Equipment Utilized During Treatment: Gait belt;Left knee immobilizer Activity Tolerance: Patient tolerated treatment well;Patient limited by fatigue Patient left: in bed;with call bell/phone within reach;with family/visitor present Nurse Communication: Mobility status   GP     Kalab Camps 11/30/2012, 1:09 PM

## 2012-11-30 NOTE — Evaluation (Signed)
Occupational Therapy Evaluation Patient Details Name: Terry [name] MRN: [id number] DOB: 1955-04-06 Today's Date: 11/30/2012 Time: 1130-1150 OT Time Calculation (min): 20 min  OT Assessment / Plan / Recommendation History of present illness Pt is s/p L TKA   Clinical Impression   Pt doing well and has family assist available at d/c. Will benefit from OT to increase independence for d/c home.     OT Assessment  Patient needs continued OT Services    Follow Up Recommendations  No OT follow up;Supervision/Assistance - 24 hour    Barriers to Discharge      Equipment Recommendations  None recommended by OT    Recommendations for Other Services    Frequency  Min 2X/week    Precautions / Restrictions Precautions Precautions: Fall;Knee Required Braces or Orthoses: Knee Immobilizer - Left Knee Immobilizer - Left: Discontinue once straight leg raise with < 10 degree lag Restrictions Weight Bearing Restrictions: No Other Position/Activity Restrictions: WBAT   Pertinent Vitals/Pain 5/10 L knee; reposition, ice    ADL  Eating/Feeding: Simulated;Independent Where Assessed - Eating/Feeding: Chair Grooming: Simulated;Wash/dry hands;Set up Where Assessed - Grooming: Supported sitting Upper Body Bathing: Simulated;Chest;Right arm;Left arm;Abdomen;Set up Where Assessed - Upper Body Bathing: Unsupported sitting Lower Body Bathing: Simulated;Moderate assistance Where Assessed - Lower Body Bathing: Supported sit to stand Upper Body Dressing: Simulated;Set up Where Assessed - Upper Body Dressing: Unsupported sitting Lower Body Dressing: Simulated;Moderate assistance Where Assessed - Lower Body Dressing: Supported sit to stand Toilet Transfer: Mining engineer Method: Sit to stand Toileting - Architect and Hygiene: Simulated;Minimal assistance Where Assessed - Engineer, mining and Hygiene: Sit to stand from 3-in-1 or  toilet Equipment Used: Rolling walker ADL Comments: Pt has a Sports administrator but states his family can help with LB ADL. He plans to use shower stall though he usually uses tub. Has a tubseat. DIscussed KI and when to wear.     OT Diagnosis: Generalized weakness  OT Problem List: Decreased strength;Decreased knowledge of use of DME or AE OT Treatment Interventions: Self-care/ADL training;DME and/or AE instruction;Therapeutic activities   OT Goals(Current goals can be found in the care plan section) Acute Rehab OT Goals Patient Stated Goal: Resume previous lifestyle with decreased pain OT Goal Formulation: With patient Time For Goal Achievement: 12/07/12 Potential to Achieve Goals: Good  Visit Information  Last OT Received On: 11/30/12 Assistance Needed: +1 History of Present Illness: Pt is s/p L TKA       Prior Functioning     Home Living Family/patient expects to be discharged to:: Private residence Living Arrangements: Spouse/significant other Available Help at Discharge: Family Type of Home: House Home Access: Stairs to enter Secretary/administrator of Steps: 5 Entrance Stairs-Rails: Right;Left Home Layout: Able to live on main level with bedroom/bathroom Home Equipment: Walker - 2 wheels;Cane - single point;Crutches;Shower seat;Bedside commode;Adaptive equipment Adaptive Equipment: Reacher Prior Function Level of Independence: Independent Communication Communication: No difficulties         Vision/Perception     Cognition  Cognition Arousal/Alertness: Awake/alert Behavior During Therapy: WFL for tasks assessed/performed Overall Cognitive Status: Within Functional Limits for tasks assessed    Extremity/Trunk Assessment Upper Extremity Assessment Upper Extremity Assessment: Overall WFL for tasks assessed     Mobility  Transfers Transfers: Sit to Stand;Stand to Sit Sit to Stand: 4: Min assist;With upper extremity assist;From chair/3-in-1 Stand to Sit: 4: Min  assist;With upper extremity assist;To chair/3-in-1 Details for Transfer Assistance: cues for LE management and use of UEs to self assist  Balance Balance Balance Assessed: Yes Dynamic Standing Balance Dynamic Standing - Level of Assistance: 4: Min assist   End of Session OT - End of Session Equipment Utilized During Treatment: Gait belt;Rolling walker Activity Tolerance: Patient tolerated treatment well Patient left: in chair;with call bell/phone within reach  GO     Lennox Laity  [phone] 11/30/2012, 1:30 PM

## 2012-12-01 DIAGNOSIS — R339 Retention of urine, unspecified: Secondary | ICD-10-CM

## 2012-12-01 LAB — CBC
HCT: 21.9 % — ABNORMAL LOW (ref 39.0–52.0)
Hemoglobin: 7.2 g/dL — ABNORMAL LOW (ref 13.0–17.0)
MCH: 24.8 pg — ABNORMAL LOW (ref 26.0–34.0)
MCHC: 32.9 g/dL (ref 30.0–36.0)
MCV: 75.5 fL — ABNORMAL LOW (ref 78.0–100.0)
RDW: 14 % (ref 11.5–15.5)

## 2012-12-01 MED ORDER — FUROSEMIDE 10 MG/ML IJ SOLN
20.0000 mg | Freq: Once | INTRAMUSCULAR | Status: AC
Start: 2012-12-01 — End: 2012-12-01
  Administered 2012-12-01: 20 mg via INTRAVENOUS
  Filled 2012-12-01: qty 2

## 2012-12-01 NOTE — Progress Notes (Signed)
Per [facility] pt has only one unit available. Notified Dr Ophelia Charter by phone & order given to transfuse just the one unit & give the lasix afterward. Terry [name], [employer]

## 2012-12-01 NOTE — Progress Notes (Signed)
PT Cancellation Note  ___Treatment cancelled today due to medical issues with patient which prohibited therapy  ___ Treatment cancelled today due to patient receiving procedure or test   ___ Treatment cancelled today due to patient's refusal to participate   _X_Pm session deferred.  Pt in bed on CPM receiving blood.  Felecia Shelling  PTA [facility]  Acute  Rehab Pager      [phone]

## 2012-12-01 NOTE — Plan of Care (Signed)
Problem: Phase III Progression Outcomes Goal: Pain controlled on oral analgesia Outcome: Completed/Met Date Met:  12/01/12

## 2012-12-01 NOTE — Progress Notes (Signed)
Physical Therapy Treatment Patient Details Name: Terry [name] MRN: [id number] DOB: 02/18/1955 Today's Date: 12/01/2012 Time: 0822-0850 PT Time Calculation (min): 28 min  PT Assessment / Plan / Recommendation  History of Present Illness Pt is s/p L TKA   PT Comments   *Pt is progressing well with mobility. Expect he will be ready to DC home tomorrow. Will do stair training next session. L knee flexion is approximately 65* AAROM. **  Follow Up Recommendations  Home health PT     Does the patient have the potential to tolerate intense rehabilitation     Barriers to Discharge        Equipment Recommendations  None recommended by PT    Recommendations for Other Services OT consult  Frequency 7X/week   Progress towards PT Goals Progress towards PT goals: Progressing toward goals  Plan Current plan remains appropriate    Precautions / Restrictions Precautions Precautions: Fall;Knee Required Braces or Orthoses: Knee Immobilizer - Left Knee Immobilizer - Left: Discontinue once straight leg raise with < 10 degree lag Restrictions Weight Bearing Restrictions: No Other Position/Activity Restrictions: WBAT   Pertinent Vitals/Pain **2/10 L knee with walking Premedicated, ice applied*    Mobility  Bed Mobility Supine to Sit: 4: Min guard Details for Bed Mobility Assistance: cues for sequence and use of RLE to self assist LLE Transfers Transfers: Sit to Stand;Stand to Sit Sit to Stand: With upper extremity assist;4: Min guard;From bed Stand to Sit: 5: Supervision Details for Transfer Assistance: cues for LE management and use of UEs to self assist Ambulation/Gait Ambulation/Gait Assistance: 4: Min guard Ambulation Distance (Feet): 200 Feet Assistive device: Rolling walker Gait Pattern: Step-through pattern Gait velocity: decr General Gait Details: steady, no LOB, good heel strike LLE    Exercises Total Joint Exercises Ankle Circles/Pumps: AROM;10 reps;Supine;Both Quad Sets:  AROM;10 reps;Both;Supine Towel Squeeze: AROM;Both;10 reps Short Arc QuadBarbaraann Boys;Left;10 reps Heel Slides: AAROM;Supine;Left;10 reps Hip ABduction/ADduction: AROM;Left;10 reps Straight Leg Raises: AAROM;Left;10 reps;Supine;AROM   PT Diagnosis:    PT Problem List:   PT Treatment Interventions:     PT Goals (current goals can now be found in the care plan section) Acute Rehab PT Goals Patient Stated Goal: Resume previous lifestyle with decreased pain PT Goal Formulation: With patient Time For Goal Achievement: 12/06/12 Potential to Achieve Goals: Good  Visit Information  Last PT Received On: 12/01/12 Assistance Needed: +1 History of Present Illness: Pt is s/p L TKA    Subjective Data  Patient Stated Goal: Resume previous lifestyle with decreased pain   Cognition  Cognition Arousal/Alertness: Awake/alert Behavior During Therapy: WFL for tasks assessed/performed Overall Cognitive Status: Within Functional Limits for tasks assessed    Balance     End of Session PT - End of Session Equipment Utilized During Treatment: Gait belt Activity Tolerance: Patient tolerated treatment well Patient left: with call bell/phone within reach;with family/visitor present;in chair Nurse Communication: Mobility status   GP     Ralene Bathe Kistler 12/01/2012, 9:02 AM [phone]

## 2012-12-01 NOTE — Progress Notes (Signed)
Occupational Therapy Treatment Patient Details Name: Terry [name] MRN: [id number] DOB: 04/11/1955 Today's Date: 12/01/2012 Time: 1125-1150 OT Time Calculation (min): 25 min  OT Assessment / Plan / Recommendation  History of present illness Pt is s/p L TKA   OT comments  Practiced bathroom transfers which pt performed with supervision.  No further OT needs. Pt has a 3:1 and shower stool at home    Follow Up Recommendations  No OT follow up;Supervision/Assistance - 24 hour    Barriers to Discharge       Equipment Recommendations  None recommended by OT    Recommendations for Other Services    Frequency     Progress towards OT Goals Progress towards OT goals: Goals met/education completed, patient discharged from OT  Plan      Precautions / Restrictions Precautions Precautions: Fall;Knee Required Braces or Orthoses: Knee Immobilizer - Left Knee Immobilizer - Left:  (did not use as pt said d/c'd this am) Restrictions Weight Bearing Restrictions: No Other Position/Activity Restrictions: WBAT   Pertinent Vitals/Pain No c/o pain    ADL  Toilet Transfer: Supervision/safety Toilet Transfer Method: Sit to Barista: Raised toilet seat with arms (or 3-in-1 over toilet) Toileting - Clothing Manipulation and Hygiene: Simulated;Supervision/safety Where Assessed - Toileting Clothing Manipulation and Hygiene: Sit to stand from 3-in-1 or toilet Tub/Shower Transfer: Supervision/safety Tub/Shower Transfer Method: Science writer: Walk in shower Equipment Used: Rolling walker Transfers/Ambulation Related to ADLs: pt ambulated to bathroom.  Cues only for sidestepping through tight space ADL Comments: Practiced bathroom transfers:  cues for sequence.  Pt will have help at home.  He has a Sports administrator and wanted to see other AE in my bag.  He will consider getting a long sponge.  Showed sock aid, but pt would need a wide model:  I did not have this  with me.  Pt has a small shower stall and a stool.  He will sponge bathe initially.      OT Diagnosis:    OT Problem List:   OT Treatment Interventions:     OT Goals(current goals [name] now be found in the care plan section) Acute Rehab OT Goals Patient Stated Goal: Resume previous lifestyle with decreased pain  Visit Information  Last OT Received On: 12/01/12 Assistance Needed: +1 History of Present Illness: Pt is s/p L TKA    Subjective Data      Prior Functioning       Cognition  Cognition Arousal/Alertness: Awake/alert Behavior During Therapy: WFL for tasks assessed/performed Overall Cognitive Status: Within Functional Limits for tasks assessed    Mobility   Transfers Sit to Stand: 5: Supervision;From chair/3-in-1;With upper extremity assist Stand to Sit: 5: Supervision Details for Transfer Assistance: no cues needed during this session    Exercises     Balance     End of Session OT - End of Session Activity Tolerance: Patient tolerated treatment well Patient left: in chair;with call bell/phone within reach  GO     Lina Hitch 12/01/2012, 12:07 PM Marica Otter, OTR/L [phone] 12/01/2012

## 2012-12-01 NOTE — Progress Notes (Addendum)
Subjective: 2 Days Post-Op Procedure(s) (LRB): LEFT TOTAL KNEE ARTHROPLASTY (Left) Patient reports pain as mild.    Objective: Vital signs in last 24 hours: Temp:  [98.1 F (36.7 C)-99.5 F (37.5 C)] 98.6 F (37 C) (10/19 0600) Pulse Rate:  [83-89] 86 (10/19 0600) Resp:  [16-20] 20 (10/19 0600) BP: (104-129)/(67-88) 108/69 mmHg (10/19 0600) SpO2:  [91 %-98 %] 91 % (10/19 0600)  Intake/Output from previous day: 10/18 0701 - 10/19 0700 In: 2260 [P.O.:1660; I.V.:600] Out: 2700 [Urine:1700; Drains:1000] Intake/Output this shift: Total I/O In: 420 [P.O.:420] Out: 1840 [Urine:1840]   Recent Labs  11/30/12 0450 12/01/12 0405  HGB 8.2* 7.2*    Recent Labs  11/30/12 0450 12/01/12 0405  WBC 8.7 11.4*  RBC 3.34* 2.90*  HCT 25.6* 21.9*  PLT 268 239    Recent Labs  11/30/12 0450  NA 130*  K 4.2  CL 93*  CO2 28  BUN 14  CREATININE 1.17  GLUCOSE 138*  CALCIUM 8.1*   No results found for this basename: LABPT, INR,  in the last 72 hours  Neurologically intact  Assessment/Plan: 2 Days Post-Op Procedure(s) (LRB): LEFT TOTAL KNEE ARTHROPLASTY (Left) Up with therapy  Recheck Hgb in AM  Now 7.2. He walked down to Nursing station not symptomatic so will repeat in AM. If he becomes symptomatic will transfuse. Hgb was low on admission.   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 WILL LEAVE FOR NOW TO PREVENT IRRITATION FROM REPETITIVE CATH.    Marland Kitchen     SCAN SHOWED 348 CC .    LIKELY HOME WITH FOLEY AND FOLLOWUP WITH UROLOGIST LATER IN WEEK.  PREVIOUSLY SAW DR.SURAL WHO HAS RETIRED.

## 2012-12-02 ENCOUNTER — Encounter (HOSPITAL_COMMUNITY): Payer: Self-pay | Admitting: Orthopaedic Surgery

## 2012-12-02 LAB — TYPE AND SCREEN
ABO/RH(D): O POS
DAT, IgG: NEGATIVE
Donor AG Type: NEGATIVE

## 2012-12-02 LAB — CBC
HCT: 20 % — ABNORMAL LOW (ref 39.0–52.0)
MCH: 28.6 pg (ref 26.0–34.0)
MCHC: 37 g/dL — ABNORMAL HIGH (ref 30.0–36.0)
MCV: 77.2 fL — ABNORMAL LOW (ref 78.0–100.0)
Platelets: 228 10*3/uL (ref 150–400)
RDW: 14.4 % (ref 11.5–15.5)
WBC: 9.4 10*3/uL (ref 4.0–10.5)

## 2012-12-02 MED ORDER — ASPIRIN 325 MG PO TBEC: 325.0000 mg | DELAYED_RELEASE_TABLET | Freq: Two times a day (BID) | ORAL | Status: DC

## 2012-12-02 MED ORDER — OXYCODONE-ACETAMINOPHEN 5-325 MG PO TABS: 1.0000 | ORAL_TABLET | ORAL | Status: DC | PRN

## 2012-12-02 MED ORDER — TAMSULOSIN HCL 0.4 MG PO CAPS
0.4000 mg | ORAL_CAPSULE | Freq: Once | ORAL | Status: AC
  Administered 2012-12-02: 08:00:00 0.4 mg via ORAL
  Filled 2012-12-02: qty 1

## 2012-12-02 MED ORDER — FERROUS SULFATE 325 (65 FE) MG PO TABS: 325.0000 mg | ORAL_TABLET | Freq: Three times a day (TID) | ORAL | Status: DC

## 2012-12-02 NOTE — Progress Notes (Signed)
Subjective: 3 Days Post-Op Procedure(s) (LRB): LEFT TOTAL KNEE ARTHROPLASTY (Left) Patient reports pain as mild.  Asymptomatic acute blood loss anemia.  Feels good overall.  Did have transfusion yesterday of one unit.  Blood compatibility issues.  Objective: Vital signs in last 24 hours: Temp:  [98.7 F (37.1 C)-99.9 F (37.7 C)] 99.6 F (37.6 C) (10/20 0557) Pulse Rate:  [83-90] 83 (10/20 0557) Resp:  [16-20] 16 (10/20 0557) BP: (112-138)/(69-84) 119/74 mmHg (10/20 0557) SpO2:  [93 %-94 %] 94 % (10/20 0557)  Intake/Output from previous day: 10/19 0701 - 10/20 0700 In: 1930.8 [P.O.:1380; I.V.:250; Blood:300.8] Out: 5690 [Urine:5690] Intake/Output this shift:     Recent Labs  11/30/12 0450 12/01/12 0405 12/02/12 0428  HGB 8.2* 7.2* 7.4*    Recent Labs  12/01/12 0405 12/02/12 0428  WBC 11.4* 9.4  RBC 2.90* 2.59*  HCT 21.9* 20.0*  PLT 239 228    Recent Labs  11/30/12 0450  NA 130*  K 4.2  CL 93*  CO2 28  BUN 14  CREATININE 1.17  GLUCOSE 138*  CALCIUM 8.1*   No results found for this basename: LABPT, INR,  in the last 72 hours  Sensation intact distally Intact pulses distally Dorsiflexion/Plantar flexion intact Incision: no drainage No cellulitis present Compartment soft  Assessment/Plan: 3 Days Post-Op Procedure(s) (LRB): LEFT TOTAL KNEE ARTHROPLASTY (Left) Discharge home with home health  Terry [name] 12/02/2012, 7:37 AM

## 2012-12-02 NOTE — Progress Notes (Signed)
Discharge summary sent to payer through [facility]  

## 2012-12-02 NOTE — Discharge Summary (Signed)
Patient ID: Terry [name] MRN: [id number] DOB/AGE: 1955-11-15 57 y.o.  Admit date: 11/29/2012 Discharge date: 12/02/2012  Admission Diagnoses:  Principal Problem:   Degenerative arthritis of left knee Active Problems:   Urinary retention   Discharge Diagnoses:  Same  Past Medical History  Diagnosis Date  . Complication of anesthesia     hx of irregular beat after anesthesia > 20 yrs ago   . Chronic kidney disease     hx of uti   . GERD (gastroesophageal reflux disease)   . H/O hiatal hernia   . Arthritis     rheumatoid  . Hepatitis     hx of as a child   . CHF (congestive heart failure)     nonischemic dilated cardiomyopathy  . Aortic insufficiency     mild   . Ejection fraction < 50%     by echo 08/25/11   . H/O hematuria   . Dysrhythmia 2013    atrial fib     Surgeries: Procedure(s): LEFT TOTAL KNEE ARTHROPLASTY on 11/29/2012   Consultants:    Discharged Condition: Improved  Hospital Course: Terry [name] is an 57 y.o. male who was admitted 11/29/2012 for operative treatment ofDegenerative arthritis of knee. Patient has severe unremitting pain that affects sleep, daily activities, and work/hobbies. After pre-op clearance the patient was taken to the operating room on 11/29/2012 and underwent  Procedure(s): LEFT TOTAL KNEE ARTHROPLASTY.    Patient was given perioperative antibiotics: Anti-infectives   Start     Dose/Rate Route Frequency Ordered Stop   11/29/12 1400  ceFAZolin (ANCEF) IVPB 1 g/50 mL premix     1 g 100 mL/hr over 30 Minutes Intravenous Every 6 hours 11/29/12 1131 11/29/12 1944   11/29/12 0556  ceFAZolin (ANCEF) IVPB 2 g/50 mL premix     2 g 100 mL/hr over 30 Minutes Intravenous On call to O.R. 11/29/12 3086 11/29/12 5784       Patient was given sequential compression devices, early ambulation, and chemoprophylaxis to prevent DVT.  Patient benefited maximally from hospital stay and there were no complications.  He did receive one unit of red  blood cells but could not receive another unit due to compatibility and blood-bank issues.  He was completely asymptomatic on discharge and it was discussed extensively about his low blood and the signs/symtoptoms that would require treatment or being seen again in close follow-up.  Will arrange for H/H check post discharge.  Recent vital signs: Patient Vitals for the past 24 hrs:  BP Temp Temp src Pulse Resp SpO2  12/02/12 0557 119/74 mmHg 99.6 F (37.6 C) Oral 83 16 94 %  12/01/12 2135 - 99.1 F (37.3 C) Oral - - -  12/01/12 2133 112/69 mmHg 99.9 F (37.7 C) Oral 85 18 93 %  12/01/12 1731 138/84 mmHg 98.7 F (37.1 C) Oral 88 20 -  12/01/12 1710 133/81 mmHg 99.1 F (37.3 C) Oral 87 16 -  12/01/12 1610 125/79 mmHg 99.3 F (37.4 C) Oral 85 18 -  12/01/12 1510 112/77 mmHg 98.8 F (37.1 C) Oral 88 16 -  12/01/12 1410 123/74 mmHg 99.6 F (37.6 C) Oral 90 16 -     Recent laboratory studies:  Recent Labs  11/30/12 0450 12/01/12 0405 12/02/12 0428  WBC 8.7 11.4* 9.4  HGB 8.2* 7.2* 7.4*  HCT 25.6* 21.9* 20.0*  PLT 268 239 228  NA 130*  --   --   K 4.2  --   --  CL 93*  --   --   CO2 28  --   --   BUN 14  --   --   CREATININE 1.17  --   --   GLUCOSE 138*  --   --   CALCIUM 8.1*  --   --      Discharge Medications:     Medication List    STOP taking these medications       ibuprofen 200 MG tablet  Commonly known as:  ADVIL,MOTRIN     naproxen sodium 220 MG tablet  Commonly known as:  ANAPROX     traMADol 50 MG tablet  Commonly known as:  ULTRAM      TAKE these medications       aspirin 325 MG EC tablet  Take 1 tablet (325 mg total) by mouth 2 (two) times daily after a meal.     carvedilol 25 MG tablet  Commonly known as:  COREG  Take 25 mg by mouth 2 (two) times daily with a meal.     ferrous sulfate 325 (65 FE) MG tablet  Take 1 tablet (325 mg total) by mouth 3 (three) times daily with meals.     furosemide 40 MG tablet  Commonly known as:  LASIX  Take  40 mg by mouth 2 (two) times daily.     oxyCODONE-acetaminophen 5-325 MG per tablet  Commonly known as:  ROXICET  Take 1-2 tablets by mouth every 4 (four) hours as needed for pain.     potassium chloride SA 20 MEQ tablet  Commonly known as:  K-DUR,KLOR-CON  Take 20 mEq by mouth 2 (two) times daily.     ramipril 2.5 MG capsule  Commonly known as:  ALTACE  Take 2.5 mg by mouth daily with breakfast.     ramipril 5 MG capsule  Commonly known as:  ALTACE  Take 5 mg by mouth daily with breakfast.        Diagnostic Studies: Dg Chest 2 View  11/25/2012   CLINICAL DATA:  Preop knee surgery  EXAM: CHEST  2 VIEW  COMPARISON:  09/08/2011  FINDINGS: The heart size and mediastinal contours are within normal limits. Both lungs are clear. The visualized skeletal structures are unremarkable.  IMPRESSION: No active cardiopulmonary disease.   Electronically Signed   By: Signa Kell M.D.   On: 11/25/2012 09:01   Dg Knee Left Port  11/29/2012   CLINICAL DATA:  Post left knee replacement.  EXAM: PORTABLE LEFT KNEE - 1-2 VIEW  COMPARISON:  The [facility].  FINDINGS: Splint causes artifact.  Total left knee replacement appears in satisfactory position without complication noted. Minimal radiopaque structures anterior and lateral to the tibial plateau may represent small bony structures.  Surgical drain is in place.  IMPRESSION: Total left knee replacement appears in satisfactory position without complication noted. Minimal radiopaque structures anterior and lateral to the tibial plateau may represent small bony structures.   Electronically Signed   By: Bridgett Larsson M.D.   On: 11/29/2012 10:11    Disposition: 01-Home or Self Care      Discharge Orders   Future Appointments Provider Department Dept Phone   01/01/2013 10:00 AM Quintella Reichert, MD [facility] [phone]   Future Orders Complete By Expires   Call MD / Call 911  As directed    Comments:     If you experience chest pain or  shortness of breath, CALL 911 and be transported to the hospital  emergency room.  If you develope a fever above 101 F, pus (white drainage) or increased drainage or redness at the wound, or calf pain, call your surgeon's office.   Constipation Prevention  As directed    Comments:     Drink plenty of fluids.  Prune juice may be helpful.  You may use a stool softener, such as Colace (over the counter) 100 mg twice a day.  Use MiraLax (over the counter) for constipation as needed.   Diet - low sodium heart healthy  As directed    Discharge instructions  As directed    Comments:     Increase your activities as comfort allows. Expect left knee swelling; ice and elevation as needed. You can remove your dressing and get your actual incision wet in the shower starting 12/04/12.   Discharge patient  As directed    Increase activity slowly as tolerated  As directed       Follow-up Information   Please follow up. (Home Health Physical Therapy)       Follow up with [name], MD In 2 weeks.   Specialty:  Orthopedic Surgery   Contact information:   [address] [place] [address] [phone]        Signed: [name] 12/02/2012, 7:43 AM

## 2012-12-02 NOTE — Progress Notes (Signed)
Physical Therapy Treatment Patient Details Name: ABHIJAY MORRISS MRN: [id number] DOB: 1955/11/07 Today's Date: 12/02/2012 Time: 7846-9629 PT Time Calculation (min): 33 min  PT Assessment / Plan / Recommendation  History of Present Illness Pt is s/p L TKA   PT Comments   porgressing well. For DC today/  Follow Up Recommendations  Home health PT     Does the patient have the potential to tolerate intense rehabilitation     Barriers to Discharge        Equipment Recommendations       Recommendations for Other Services    Frequency 7X/week   Progress towards PT Goals Progress towards PT goals: Progressing toward goals  Plan Current plan remains appropriate    Precautions / Restrictions Precautions Precautions: Fall;Knee Restrictions Weight Bearing Restrictions: No   Pertinent Vitals/Pain No pain.    Mobility  Bed Mobility Sit to Supine: 7: Independent Transfers Sit to Stand: 6: Modified independent (Device/Increase time) Stand to Sit: 6: Modified independent (Device/Increase time) Details for Transfer Assistance: no cues needed during this session Ambulation/Gait Ambulation/Gait Assistance: 6: Modified independent (Device/Increase time) Ambulation Distance (Feet): 150 Feet Gait Pattern: Step-through pattern General Gait Details: steady, no LOB, good heel strike LLE    Exercises Total Joint Exercises Ankle Circles/Pumps: AROM;10 reps;Supine;Both Quad Sets: AROM;10 reps;Both;Supine Towel Squeeze: AROM;Both;10 reps Short Arc Quad: Left;10 reps;AROM Heel Slides: Supine;Left;10 reps;AROM Straight Leg Raises: Left;10 reps;Supine;AROM Goniometric ROM: 5-60 L knee flexion AROM   PT Diagnosis:    PT Problem List:   PT Treatment Interventions:     PT Goals (current goals can now be found in the care plan section)    Visit Information  Last PT Received On: 12/02/12 Assistance Needed: +1 History of Present Illness: Pt is s/p L TKA    Subjective Data       Cognition  Cognition Arousal/Alertness: Awake/alert    Balance     End of Session PT - End of Session Activity Tolerance: Patient tolerated treatment well Patient left: with call bell/phone within reach;with family/visitor present;in bed Nurse Communication: Mobility status (ready for DC)   GP     Rada Hay 12/02/2012, 10:29 AM

## 2012-12-31 ENCOUNTER — Encounter: Payer: Self-pay | Admitting: Cardiology

## 2013-01-01 ENCOUNTER — Ambulatory Visit (INDEPENDENT_AMBULATORY_CARE_PROVIDER_SITE_OTHER): Payer: 59 | Admitting: Cardiology

## 2013-01-01 ENCOUNTER — Encounter: Payer: Self-pay | Admitting: Cardiology

## 2013-01-01 ENCOUNTER — Encounter: Payer: Self-pay | Admitting: General Surgery

## 2013-01-01 VITALS — BP 110/78 | HR 72 | Ht 71.0 in | Wt 222.1 lb

## 2013-01-01 DIAGNOSIS — I77810 Thoracic aortic ectasia: Secondary | ICD-10-CM

## 2013-01-01 DIAGNOSIS — I428 Other cardiomyopathies: Secondary | ICD-10-CM

## 2013-01-01 DIAGNOSIS — I351 Nonrheumatic aortic (valve) insufficiency: Secondary | ICD-10-CM

## 2013-01-01 DIAGNOSIS — I4819 Other persistent atrial fibrillation: Secondary | ICD-10-CM | POA: Insufficient documentation

## 2013-01-01 DIAGNOSIS — I509 Heart failure, unspecified: Secondary | ICD-10-CM

## 2013-01-01 DIAGNOSIS — I5022 Chronic systolic (congestive) heart failure: Secondary | ICD-10-CM

## 2013-01-01 DIAGNOSIS — I359 Nonrheumatic aortic valve disorder, unspecified: Secondary | ICD-10-CM

## 2013-01-01 DIAGNOSIS — I10 Essential (primary) hypertension: Secondary | ICD-10-CM | POA: Insufficient documentation

## 2013-01-01 DIAGNOSIS — I420 Dilated cardiomyopathy: Secondary | ICD-10-CM | POA: Insufficient documentation

## 2013-01-01 NOTE — Patient Instructions (Signed)
Your physician recommends that you continue on your current medications as directed. Please refer to the Current Medication list given to you today.  Your physician has requested that you have an echocardiogram. Echocardiography is a painless test that uses sound waves to create images of your heart. It provides your doctor with information about the size and shape of your heart and how well your heart's chambers and valves are working. This procedure takes approximately one hour. There are no restrictions for this procedure.  Your physician wants you to follow-up in: 6 Months with Dr Turner You will receive a reminder letter in the mail two months in advance. If you don't receive a letter, please call our office to schedule the follow-up appointment.  

## 2013-01-01 NOTE — Progress Notes (Addendum)
[address] [place], [place]  [address] Phone: [phone] Fax:  [phone]  Date:  01/02/2013   ID:  Terry [name], DOB 1955/04/23, MRN [id number]  PCP:  [name], MD  Cardiologist:  Armanda Magic, MD     History of Present Illness: Terry [name] is a 57 y.o. male with a history of nonischemic DCM, HTN, chronic systolic CHF and PAF who presents today for followup.  He is doing well.  He denies any chest pain, SOB, DOE, LE edema, dizziness, palpitations or syncope.   Wt Readings from Last 3 Encounters:  01/01/13 222 lb 1.9 oz (100.753 kg)  11/29/12 226 lb (102.513 kg)  11/29/12 226 lb (102.513 kg)     Past Medical History  Diagnosis Date  . Complication of anesthesia     hx of irregular beat after anesthesia > 20 yrs ago   . Chronic kidney disease     hx of uti   . GERD (gastroesophageal reflux disease)   . H/O hiatal hernia   . Arthritis     rheumatoid  . Hepatitis     hx of as a child   . Aortic insufficiency     mild   . Ejection fraction < 50%     by echo 08/25/11   . H/O hematuria   . 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 Hypertension   . Dysrhythmia 2013    atrial fib   . Chronic systolic CHF (congestive heart failure), NYHA class 1   . Aortic root dilatation     Current Outpatient Prescriptions  Medication Sig Dispense Refill  . aspirin 81 MG chewable tablet Chew 81 mg by mouth daily.      . carvedilol (COREG) 25 MG tablet Take 25 mg by mouth 2 (two) times daily with a meal.      . furosemide (LASIX) 40 MG tablet Take 40 mg by mouth 2 (two) times daily.      Marland Kitchen oxyCODONE-acetaminophen (ROXICET) 5-325 MG per tablet Take 1-2 tablets by mouth every 4 (four) hours as needed for pain.  60 tablet  0  . potassium chloride SA (K-DUR,KLOR-CON) 20 MEQ tablet Take 20 mEq by mouth 2 (two) times daily.      . ramipril (ALTACE) 2.5 MG capsule Take 2.5 mg by mouth daily with breakfast.      . ramipril (ALTACE) 5 MG capsule Take 5 mg by  mouth daily with breakfast.       No current facility-administered medications for this visit.    Allergies:    Allergies  Allergen Reactions  . Sulfa Drugs Cross Reactors Hives and Other (See Comments)    thrush    Social History:  The patient  reports that he has never smoked. He has never used smokeless tobacco. He reports that he drinks about 0.6 ounces of alcohol per week. He reports that he does not use illicit drugs.   Family History:  The patient's family history includes Heart attack in his father; Heart disease in his father; Heart failure in his brother.   ROS:  Please see the history of present illness.      All other systems reviewed and negative.   PHYSICAL EXAM: VS:  BP 110/78  Pulse 72  Ht 5\' 11"  (1.803 m)  Wt 222 lb 1.9 oz (100.753 kg)  BMI 30.99 kg/m2 Well nourished, well developed, in no acute distress HEENT: normal Neck: no JVD Cardiac:  normal  S1, S2; RRR; no murmur Lungs:  clear to auscultation bilaterally, no wheezing, rhonchi or rales Abd: soft, nontender, no hepatomegaly Ext: trace to 1+ edema Skin: warm and dry Neuro:  CNs 2-12 intact, no focal abnormalities noted       ASSESSMENT AND PLAN:  1. Nonischemic DCM 2. HTN - controlled  - continue Carvedilol/Ramipril 3. PAF- no reoccurence  - continue Carvedilol/ASA 4. Mild aortic root dilatation followed with yearly echo  - 2D echo to assess aortic root 5. Chronic systolic CHF well compensated with chronic LE edem which is stable  - continue Lasix  Followup with me in 6 months  Signed, Armanda Magic, MD 01/02/2013 10:01 PM

## 2013-01-16 ENCOUNTER — Ambulatory Visit (HOSPITAL_COMMUNITY): Payer: 59 | Attending: Cardiology | Admitting: Radiology

## 2013-01-16 ENCOUNTER — Other Ambulatory Visit: Payer: Self-pay

## 2013-01-16 DIAGNOSIS — I4891 Unspecified atrial fibrillation: Secondary | ICD-10-CM | POA: Insufficient documentation

## 2013-01-16 DIAGNOSIS — I509 Heart failure, unspecified: Secondary | ICD-10-CM | POA: Insufficient documentation

## 2013-01-16 DIAGNOSIS — I359 Nonrheumatic aortic valve disorder, unspecified: Secondary | ICD-10-CM

## 2013-01-16 DIAGNOSIS — I428 Other cardiomyopathies: Secondary | ICD-10-CM | POA: Insufficient documentation

## 2013-01-16 DIAGNOSIS — I079 Rheumatic tricuspid valve disease, unspecified: Secondary | ICD-10-CM | POA: Insufficient documentation

## 2013-01-16 DIAGNOSIS — I5022 Chronic systolic (congestive) heart failure: Secondary | ICD-10-CM | POA: Insufficient documentation

## 2013-01-16 DIAGNOSIS — I10 Essential (primary) hypertension: Secondary | ICD-10-CM | POA: Insufficient documentation

## 2013-01-16 DIAGNOSIS — I77810 Thoracic aortic ectasia: Secondary | ICD-10-CM

## 2013-01-16 NOTE — Progress Notes (Signed)
Echocardiogram performed.  

## 2013-02-17 ENCOUNTER — Telehealth: Payer: Self-pay | Admitting: Cardiology

## 2013-02-17 MED ORDER — POTASSIUM CHLORIDE CRYS ER 20 MEQ PO TBCR: 20.0000 meq | EXTENDED_RELEASE_TABLET | Freq: Two times a day (BID) | ORAL | Status: DC

## 2013-02-17 NOTE — Telephone Encounter (Signed)
Rx sent in for pt and pt is aware.  

## 2013-02-17 NOTE — Telephone Encounter (Signed)
New Prob   Pt requesting a new [facility] [phone].

## 2013-02-25 ENCOUNTER — Encounter: Payer: Self-pay | Admitting: General Surgery

## 2013-02-25 ENCOUNTER — Other Ambulatory Visit: Payer: Self-pay | Admitting: General Surgery

## 2013-02-25 DIAGNOSIS — I77810 Thoracic aortic ectasia: Secondary | ICD-10-CM

## 2013-05-09 ENCOUNTER — Telehealth: Payer: Self-pay | Admitting: Cardiology

## 2013-05-09 NOTE — Telephone Encounter (Deleted)
Error

## 2013-05-26 ENCOUNTER — Other Ambulatory Visit: Payer: Self-pay | Admitting: General Surgery

## 2013-05-26 MED ORDER — RAMIPRIL 2.5 MG PO CAPS: 2.5000 mg | ORAL_CAPSULE | Freq: Every day | ORAL | Status: DC

## 2013-05-26 MED ORDER — RAMIPRIL 5 MG PO CAPS: 5.0000 mg | ORAL_CAPSULE | Freq: Every day | ORAL | Status: DC

## 2013-05-26 NOTE — Telephone Encounter (Signed)
RX sent in for pt  

## 2013-05-26 NOTE — Telephone Encounter (Signed)
[phone]- [facility]  rx sent in for pt. [facility]

## 2013-05-26 NOTE — Telephone Encounter (Signed)
New message    Ramipril  2.5  &   5.0   [phone]- [facility].

## 2013-07-03 ENCOUNTER — Encounter: Payer: Self-pay | Admitting: Cardiology

## 2013-07-03 ENCOUNTER — Encounter: Payer: Self-pay | Admitting: General Surgery

## 2013-07-03 ENCOUNTER — Ambulatory Visit (INDEPENDENT_AMBULATORY_CARE_PROVIDER_SITE_OTHER): Payer: 59 | Admitting: Cardiology

## 2013-07-03 ENCOUNTER — Encounter (INDEPENDENT_AMBULATORY_CARE_PROVIDER_SITE_OTHER): Payer: Self-pay

## 2013-07-03 VITALS — BP 129/85 | HR 88 | Ht 71.0 in | Wt 217.0 lb

## 2013-07-03 DIAGNOSIS — I4891 Unspecified atrial fibrillation: Secondary | ICD-10-CM

## 2013-07-03 DIAGNOSIS — I480 Paroxysmal atrial fibrillation: Secondary | ICD-10-CM

## 2013-07-03 DIAGNOSIS — I509 Heart failure, unspecified: Secondary | ICD-10-CM

## 2013-07-03 DIAGNOSIS — I420 Dilated cardiomyopathy: Secondary | ICD-10-CM

## 2013-07-03 DIAGNOSIS — I10 Essential (primary) hypertension: Secondary | ICD-10-CM

## 2013-07-03 DIAGNOSIS — I77810 Thoracic aortic ectasia: Secondary | ICD-10-CM

## 2013-07-03 DIAGNOSIS — I5022 Chronic systolic (congestive) heart failure: Secondary | ICD-10-CM

## 2013-07-03 DIAGNOSIS — I428 Other cardiomyopathies: Secondary | ICD-10-CM

## 2013-07-03 LAB — BASIC METABOLIC PANEL
BUN: 12 mg/dL (ref 6–23)
CALCIUM: 9 mg/dL (ref 8.4–10.5)
CO2: 27 mEq/L (ref 19–32)
CREATININE: 1.2 mg/dL (ref 0.4–1.5)
Chloride: 98 mEq/L (ref 96–112)
GFR: 65.48 mL/min (ref >60.00)
Glucose, Bld: 95 mg/dL (ref 70–99)
Potassium: 4.3 mEq/L (ref 3.5–5.1)
Sodium: 134 mEq/L — ABNORMAL LOW (ref 135–145)

## 2013-07-03 LAB — CBC
HEMATOCRIT: 37.3 % — AB (ref 39.0–52.0)
HEMOGLOBIN: 12.4 g/dL — AB (ref 13.0–17.0)
MCHC: 33.2 g/dL (ref 30.0–36.0)
MCV: 82.7 fl (ref 78.0–100.0)
PLATELETS: 303 10*3/uL (ref 150.0–400.0)
RBC: 4.51 Mil/uL (ref 4.22–5.81)
RDW: 14.4 % (ref 11.5–15.5)
WBC: 8.5 10*3/uL (ref 4.0–10.5)

## 2013-07-03 MED ORDER — RIVAROXABAN 20 MG PO TABS: 20.0000 mg | ORAL_TABLET | Freq: Every day | ORAL | Status: DC

## 2013-07-03 NOTE — Progress Notes (Signed)
[address] [place], [place]  [address] Phone: [phone] Fax:  [phone]  Date:  07/03/2013   ID:  Terry [name], DOB 1955/09/08, MRN [id number]  PCP:  [name], MD  Cardiologist:  Armanda Magic, MD     History of Present Illness: Terry [name] is a 58 y.o. male with a history of nonischemic DCM, HTN, chronic systolic CHF and PAF who presents today for followup. He is doing well. He denies any chest pain, SOB, DOE, dizziness, palpitations or syncope. He has had a cold recently.  He has a history of chronic LE edema which is very well controlled.   Wt Readings from Last 3 Encounters:  07/03/13 217 lb (98.431 kg)  01/01/13 222 lb 1.9 oz (100.753 kg)  11/29/12 226 lb (102.513 kg)     Past Medical History  Diagnosis Date  . Complication of anesthesia     hx of irregular beat after anesthesia > 20 yrs ago   . Chronic kidney disease     hx of uti   . GERD (gastroesophageal reflux disease)   . H/O hiatal hernia   . Arthritis     rheumatoid  . Hepatitis     hx of as a child   . Aortic insufficiency     mild   . Ejection fraction < 50%     by echo 08/25/11   . H/O hematuria   . 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 Hypertension   . Dysrhythmia 2013    atrial fib   . Chronic systolic CHF (congestive heart failure), NYHA class 1   . Aortic root dilatation     Current Outpatient Prescriptions  Medication Sig Dispense Refill  . aspirin 81 MG chewable tablet Chew 81 mg by mouth daily.      . carvedilol (COREG) 25 MG tablet Take 25 mg by mouth 2 (two) times daily with a meal.      . furosemide (LASIX) 40 MG tablet Take 40 mg by mouth 2 (two) times daily.      Marland Kitchen levofloxacin (LEVAQUIN) 250 MG tablet Take 250 mg by mouth 2 (two) times daily.      . potassium chloride SA (K-DUR,KLOR-CON) 20 MEQ tablet Take 1 tablet (20 mEq total) by mouth 2 (two) times daily.  180 tablet  3  . ramipril (ALTACE) 2.5 MG capsule Take 1 capsule (2.5 mg total) by mouth  daily with breakfast.  90 capsule  3  . ramipril (ALTACE) 5 MG capsule Take 1 capsule (5 mg total) by mouth daily with breakfast.  90 capsule  3   No current facility-administered medications for this visit.    Allergies:    Allergies  Allergen Reactions  . Sulfa Drugs Cross Reactors Hives and Other (See Comments)    thrush    Social History:  The patient  reports that he has never smoked. He has never used smokeless tobacco. He reports that he drinks about .6 ounces of alcohol per week. He reports that he does not use illicit drugs.   Family History:  The patient's family history includes Heart attack in his father; Heart disease in his father; Heart failure in his brother.   ROS:  Please see the history of present illness.      All other systems reviewed and negative.   PHYSICAL EXAM: VS:  BP 129/85  Pulse 88  Ht 5\' 11"  (1.803 m)  Wt 217 lb (98.431 kg)  BMI 30.28 kg/m2 Well nourished, well developed, in no acute distress HEENT: normal Neck: no JVD Cardiac:  normal S1, S2; RRR; no murmur Lungs:  clear to auscultation bilaterally, no wheezing, rhonchi or rales Abd: soft, nontender, no hepatomegaly Ext: no edema Skin: warm and dry Neuro:  CNs 2-12 intact, no focal abnormalities noted  EKG:   Atrial fibrillation with CVR  ASSESSMENT AND PLAN:  1. Nonischemic DCM 2. HTN - controlled - continue Carvedilol/Ramipril  - check NOAC panel 3. PAF- now back in afib with CVR - continue Carvedilol - stop ASA - start Xarelto 20mg  daily 4. Mild aortic root dilatation followed with yearly echo 5. Chronic systolic CHF well compensated with chronic LE edem which is stable- appears compensated - continue Lasix   Followup with me in 4 weeks and if still in afib will set up for DCCV   Signed, , MD 07/03/2013 9:00 AM

## 2013-07-03 NOTE — Patient Instructions (Addendum)
Your physician has recommended you make the following change in your medication: 1. Start Xarelto 20 MG 1 tablet daily 2. Stop Aspirin  Your physician recommends that you go to the lab today for CBC and BMET  Your physician recommends that you schedule a follow-up appointment in: in 4 weeks with Dr Mayford Knife

## 2013-07-05 ENCOUNTER — Telehealth: Payer: Self-pay | Admitting: Adult Health

## 2013-07-05 NOTE — Telephone Encounter (Signed)
Called by pharmacist, Thayer Ohm, concerning Xarelto refill. Need priro authorization for this and ran out. Cannot afford $360 for price of medication. He will need enough to last until office opens on Tuesday May 26.  I have asked that he be given those tablets and then call our office for refills and authorization. Would not want to change Rx as he has been tolerating Xarelto without bleeding issues.   Message is also left with Dr. Norris Cross office nurse.

## 2013-07-07 NOTE — Telephone Encounter (Signed)
Please take care of this.  

## 2013-07-08 ENCOUNTER — Telehealth: Payer: Self-pay

## 2013-07-08 NOTE — Telephone Encounter (Signed)
Patient called to let us know that someone needed to call [facility] at [phone] about letting them know that he needs his xarelto If you need to talk to him call [phone]

## 2013-07-08 NOTE — Telephone Encounter (Signed)
Please take care of this.  

## 2013-07-10 NOTE — Telephone Encounter (Signed)
[facility]

## 2013-07-10 NOTE — Telephone Encounter (Signed)
Kim, the number is below for PA.

## 2013-07-10 NOTE — Telephone Encounter (Signed)
Samples #25 of Xarelto 20 mg up front for pt to pick up. LMOM to let pt know.

## 2013-07-10 NOTE — Telephone Encounter (Signed)
Terry [name], can you help with PA?

## 2013-07-22 NOTE — Telephone Encounter (Signed)
See other telephone encounter. PA has been sent.

## 2013-07-24 ENCOUNTER — Telehealth: Payer: Self-pay | Admitting: *Deleted

## 2013-07-24 NOTE — Telephone Encounter (Signed)
xarelto approved by [facility] through 07/20/2014 [facility] notified

## 2013-08-13 ENCOUNTER — Encounter: Payer: Self-pay | Admitting: General Surgery

## 2013-08-13 ENCOUNTER — Ambulatory Visit (INDEPENDENT_AMBULATORY_CARE_PROVIDER_SITE_OTHER): Payer: 59 | Admitting: Cardiology

## 2013-08-13 ENCOUNTER — Encounter: Payer: Self-pay | Admitting: Cardiology

## 2013-08-13 VITALS — BP 114/80 | HR 98 | Ht 71.0 in | Wt 220.0 lb

## 2013-08-13 DIAGNOSIS — I77810 Thoracic aortic ectasia: Secondary | ICD-10-CM

## 2013-08-13 DIAGNOSIS — I428 Other cardiomyopathies: Secondary | ICD-10-CM

## 2013-08-13 DIAGNOSIS — I5022 Chronic systolic (congestive) heart failure: Secondary | ICD-10-CM

## 2013-08-13 DIAGNOSIS — I509 Heart failure, unspecified: Secondary | ICD-10-CM

## 2013-08-13 DIAGNOSIS — I420 Dilated cardiomyopathy: Secondary | ICD-10-CM

## 2013-08-13 DIAGNOSIS — I480 Paroxysmal atrial fibrillation: Secondary | ICD-10-CM

## 2013-08-13 DIAGNOSIS — I10 Essential (primary) hypertension: Secondary | ICD-10-CM

## 2013-08-13 DIAGNOSIS — I4891 Unspecified atrial fibrillation: Secondary | ICD-10-CM

## 2013-08-13 NOTE — Patient Instructions (Addendum)
Your physician recommends that you continue on your current medications as directed. Please refer to the Current Medication list given to you today.  Your physician has recommended that you have a Cardioversion (DCCV). Electrical Cardioversion uses a jolt of electricity to your heart either through paddles or wired patches attached to your chest. This is a controlled, usually prescheduled, procedure. Defibrillation is done under light anesthesia in the hospital, and you usually go home the day of the procedure. This is done to get your heart back into a normal rhythm. You are not awake for the procedure. Please see the instruction sheet given to you today. (08/21/13 at 11:30 with Dr Mayford Knife)  Your physician recommends that you return for lab work on 08/18/13 for BMET, CBC w/diff, and PT/INR  Your physician recommends that you schedule a follow-up appointment after DCCV

## 2013-08-13 NOTE — Progress Notes (Signed)
[address] [place], [place]  [address] Phone: [phone] Fax:  [phone]  Date:  08/13/2013   ID:  Terry [name], DOB [birth year], MRN [id number]  PCP:  [name], MD  Cardiologist:  Armanda Magic, MD     History of Present Illness: Terry [name] is a 58 y.o. male with a history of nonischemic DCM, HTN, chronic systolic CHF and PAF who presents today for followup. He is doing well. He denies any chest pain, SOB, DOE, dizziness, palpitations or syncope. He has had a cold recently. He has a history of chronic LE edema which is very well controlled and has improved.     Wt Readings from Last 3 Encounters:  08/13/13 220 lb (99.791 kg)  07/03/13 217 lb (98.431 kg)  01/01/13 222 lb 1.9 oz (100.753 kg)     Past Medical History  Diagnosis Date  . Complication of anesthesia     hx of irregular beat after anesthesia > 20 yrs ago   . Chronic kidney disease     hx of uti   . GERD (gastroesophageal reflux disease)   . H/O hiatal hernia   . Arthritis     rheumatoid  . Hepatitis     hx of as a child   . Aortic insufficiency     mild   . Ejection fraction < 50%     by echo 08/25/11   . H/O hematuria   . 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 Hypertension   . Dysrhythmia 2013    atrial fib   . Chronic systolic CHF (congestive heart failure), NYHA class 1   . Aortic root dilatation     Current Outpatient Prescriptions  Medication Sig Dispense Refill  . carvedilol (COREG) 25 MG tablet Take 25 mg by mouth 2 (two) times daily with a meal.      . furosemide (LASIX) 40 MG tablet Take 40 mg by mouth 2 (two) times daily.      . potassium chloride SA (K-DUR,KLOR-CON) 20 MEQ tablet Take 1 tablet (20 mEq total) by mouth 2 (two) times daily.  180 tablet  3  . ramipril (ALTACE) 2.5 MG capsule Take 1 capsule (2.5 mg total) by mouth daily with breakfast.  90 capsule  3  . ramipril (ALTACE) 5 MG capsule Take 1 capsule (5 mg total) by mouth daily with breakfast.  90  capsule  3  . rivaroxaban (XARELTO) 20 MG TABS tablet Take 1 tablet (20 mg total) by mouth daily with supper.  30 tablet  11   No current facility-administered medications for this visit.    Allergies:    Allergies  Allergen Reactions  . Sulfa Drugs Cross Reactors Hives and Other (See Comments)    thrush    Social History:  The patient  reports that he has never smoked. He has never used smokeless tobacco. He reports that he drinks about .6 ounces of alcohol per week. He reports that he does not use illicit drugs.   Family History:  The patient's family history includes Heart attack in his father; Heart disease in his father; Heart failure in his brother.   ROS:  Please see the history of present illness.      All other systems reviewed and negative.   PHYSICAL EXAM: VS:  BP 114/80  Pulse 98  Ht 5\' 11"  (1.803 m)  Wt 220 lb (99.791 kg)  BMI 30.70 kg/m2 Well nourished, well developed, in  no acute distress HEENT: normal Neck: no JVD Cardiac:  normal S1, S2; RRR; no murmur Lungs:  clear to auscultation bilaterally, no wheezing, rhonchi or rales Abd: soft, nontender, no hepatomegaly Ext: trace edema Skin: warm and dry Neuro:  CNs 2-12 intact, no focal abnormalities noted  EKG:  Atrial fibrillation with CVR, LAFB     ASSESSMENT AND PLAN:  1. Nonischemic DCM 2. HTN - controlled - continue Carvedilol/Ramipril  3. PAF- now back in afib with CVR - continue Carvedilol/ Xarelto  - will set up for DCCV 4. Mild aortic root dilatation followed with yearly echo 5. Chronic systolic CHF well compensated with chronic LE edem which is stable- appears compensated - continue Lasix   Followup with me after DCCV  Signed, Armanda Magic, MD 08/13/2013 9:50 AM

## 2013-08-14 ENCOUNTER — Encounter (HOSPITAL_COMMUNITY): Payer: Self-pay | Admitting: Pharmacy Technician

## 2013-08-18 ENCOUNTER — Other Ambulatory Visit (INDEPENDENT_AMBULATORY_CARE_PROVIDER_SITE_OTHER): Payer: 59

## 2013-08-18 ENCOUNTER — Telehealth: Payer: Self-pay | Admitting: Cardiology

## 2013-08-18 ENCOUNTER — Encounter: Payer: Self-pay | Admitting: General Surgery

## 2013-08-18 DIAGNOSIS — I480 Paroxysmal atrial fibrillation: Secondary | ICD-10-CM

## 2013-08-18 DIAGNOSIS — I4891 Unspecified atrial fibrillation: Secondary | ICD-10-CM

## 2013-08-18 LAB — CBC WITH DIFFERENTIAL/PLATELET
BASOS PCT: 0.3 % (ref 0.0–3.0)
Basophils Absolute: 0 10*3/uL (ref 0.0–0.1)
EOS ABS: 0.5 10*3/uL (ref 0.0–0.7)
Eosinophils Relative: 5.8 % — ABNORMAL HIGH (ref 0.0–5.0)
HCT: 36.8 % — ABNORMAL LOW (ref 39.0–52.0)
Hemoglobin: 12.2 g/dL — ABNORMAL LOW (ref 13.0–17.0)
LYMPHS PCT: 20 % (ref 12.0–46.0)
Lymphs Abs: 1.9 10*3/uL (ref 0.7–4.0)
MCHC: 33.2 g/dL (ref 30.0–36.0)
MCV: 83.2 fl (ref 78.0–100.0)
MONO ABS: 0.7 10*3/uL (ref 0.1–1.0)
Monocytes Relative: 7.2 % (ref 3.0–12.0)
NEUTROS PCT: 66.7 % (ref 43.0–77.0)
Neutro Abs: 6.3 10*3/uL (ref 1.4–7.7)
PLATELETS: 291 10*3/uL (ref 150.0–400.0)
RBC: 4.42 Mil/uL (ref 4.22–5.81)
RDW: 15 % (ref 11.5–15.5)
WBC: 9.5 10*3/uL (ref 4.0–10.5)

## 2013-08-18 LAB — BASIC METABOLIC PANEL
BUN: 12 mg/dL (ref 6–23)
CALCIUM: 9.1 mg/dL (ref 8.4–10.5)
CO2: 25 meq/L (ref 19–32)
CREATININE: 1 mg/dL (ref 0.4–1.5)
Chloride: 105 mEq/L (ref 96–112)
GFR: 83.48 mL/min (ref >60.00)
Glucose, Bld: 101 mg/dL — ABNORMAL HIGH (ref 70–99)
Potassium: 4.1 mEq/L (ref 3.5–5.1)
Sodium: 138 mEq/L (ref 135–145)

## 2013-08-18 LAB — PROTIME-INR
INR: 1.6 ratio — ABNORMAL HIGH (ref 0.8–1.0)
PROTHROMBIN TIME: 17.1 s — AB (ref 9.6–13.1)

## 2013-08-18 NOTE — Telephone Encounter (Signed)
Spoke with Pt. Made him aware it is ok to take lasix prior to procedure. He also can take it after if he prefers.

## 2013-08-18 NOTE — Telephone Encounter (Signed)
New message    Patient has question regarding medication prior to procedure.

## 2013-08-21 ENCOUNTER — Encounter (HOSPITAL_COMMUNITY): Payer: 59 | Admitting: Anesthesiology

## 2013-08-21 ENCOUNTER — Ambulatory Visit (HOSPITAL_COMMUNITY): Payer: 59 | Admitting: Anesthesiology

## 2013-08-21 ENCOUNTER — Ambulatory Visit (HOSPITAL_COMMUNITY)
Admission: RE | Admit: 2013-08-21 | Discharge: 2013-08-21 | Disposition: A | Discharge status: Home / Self Care | Payer: 59 | Source: Ambulatory Visit | Attending: Cardiology | Admitting: Cardiology

## 2013-08-21 ENCOUNTER — Encounter (HOSPITAL_COMMUNITY)
Admission: RE | Disposition: A | Discharge status: Home / Self Care | Payer: Self-pay | Source: Ambulatory Visit | Attending: Cardiology

## 2013-08-21 ENCOUNTER — Encounter (HOSPITAL_COMMUNITY): Payer: Self-pay

## 2013-08-21 DIAGNOSIS — Z7901 Long term (current) use of anticoagulants: Secondary | ICD-10-CM | POA: Insufficient documentation

## 2013-08-21 DIAGNOSIS — I10 Essential (primary) hypertension: Secondary | ICD-10-CM | POA: Insufficient documentation

## 2013-08-21 DIAGNOSIS — I509 Heart failure, unspecified: Secondary | ICD-10-CM | POA: Insufficient documentation

## 2013-08-21 DIAGNOSIS — I5022 Chronic systolic (congestive) heart failure: Secondary | ICD-10-CM | POA: Insufficient documentation

## 2013-08-21 DIAGNOSIS — I4891 Unspecified atrial fibrillation: Secondary | ICD-10-CM | POA: Insufficient documentation

## 2013-08-21 DIAGNOSIS — I428 Other cardiomyopathies: Secondary | ICD-10-CM | POA: Insufficient documentation

## 2013-08-21 DIAGNOSIS — I359 Nonrheumatic aortic valve disorder, unspecified: Secondary | ICD-10-CM | POA: Insufficient documentation

## 2013-08-21 HISTORY — PX: CARDIOVERSION: SHX1299

## 2013-08-21 SURGERY — CARDIOVERSION
Anesthesia: Monitor Anesthesia Care

## 2013-08-21 MED ORDER — SODIUM CHLORIDE 0.9 % IV SOLN
INTRAVENOUS | Status: DC | PRN
  Administered 2013-08-21: 12:00:00 via INTRAVENOUS

## 2013-08-21 MED ORDER — LIDOCAINE HCL (CARDIAC) 20 MG/ML IV SOLN
INTRAVENOUS | Status: DC | PRN
Start: 2013-08-21 — End: 2013-08-21
  Administered 2013-08-21: 80 mg via INTRAVENOUS

## 2013-08-21 MED ORDER — SODIUM CHLORIDE 0.9 % IV SOLN
INTRAVENOUS | Status: DC
  Administered 2013-08-21: 500 mL via INTRAVENOUS

## 2013-08-21 MED ORDER — SODIUM CHLORIDE 0.9 % IV SOLN: 250.0000 mL | INTRAVENOUS | Status: DC

## 2013-08-21 MED ORDER — PROPOFOL 10 MG/ML IV BOLUS
INTRAVENOUS | Status: DC | PRN
  Administered 2013-08-21: 90 mg via INTRAVENOUS

## 2013-08-21 MED ORDER — HYDROCORTISONE 1 % EX CREA: 1.0000 "application " | TOPICAL_CREAM | Freq: Three times a day (TID) | CUTANEOUS | Status: DC | PRN

## 2013-08-21 MED ORDER — SODIUM CHLORIDE 0.9 % IJ SOLN: 3.0000 mL | INTRAMUSCULAR | Status: DC | PRN

## 2013-08-21 MED ORDER — SODIUM CHLORIDE 0.9 % IJ SOLN: 3.0000 mL | Freq: Two times a day (BID) | INTRAMUSCULAR | Status: DC

## 2013-08-21 NOTE — CV Procedure (Signed)
      Electrical Cardioversion Procedure Note  Terry [name] [id number] 10-26-1955  Procedure: Electrical Cardioversion Indications:  Atrial Fibrillation  Time Out: Verified patient identification, verified procedure,medications/allergies/relevent history reviewed, required imaging and test results available.  Performed  Procedure Details  The patient was NPO after midnight. Anesthesia was administered at the beside  by Dr.Hodierne with 90mg  of propofol and Lidocaine 60mg  IV.  Cardioversion was done with synchronized biphasic defibrillation with AP pads with 150watts.  The patient converted to normal sinus rhythm. The patient tolerated the procedure well   IMPRESSION:  Successful cardioversion of atrial fibrillation    TURNER,TRACI R 08/21/2013, 11:34 AM

## 2013-08-21 NOTE — Discharge Instructions (Signed)
Electrical Cardioversion, Care After °Refer to this sheet in the next few weeks. These instructions provide you with information on caring for yourself after your procedure. Your health care provider may also give you more specific instructions. Your treatment has been planned according to current medical practices, but problems sometimes occur. Call your health care provider if you have any problems or questions after your procedure. °WHAT TO EXPECT AFTER THE PROCEDURE °After your procedure, it is typical to have the following sensations: °· Some redness on the skin where the shocks were delivered. If this is tender, a sunburn lotion or hydrocortisone cream may help. °· Possible return of an abnormal heart rhythm within hours or days after the procedure. °HOME CARE INSTRUCTIONS °· Only take medicine as directed by your health care provider. Be sure you understand how and when to take your medicine. °· Learn how to feel your pulse and check it often. °· Limit your activity for 48 hours after the procedure or as directed. °· Avoid or minimize caffeine and other stimulants as directed. °SEEK MEDICAL CARE IF: °· You feel like your heart is beating too fast or your pulse is not regular. °· You have any questions about your medicines. °· You have bleeding that will not stop. °SEEK IMMEDIATE MEDICAL CARE IF: °· You are dizzy or feel faint. °· It is hard to breathe or you feel short of breath. °· There is a change in discomfort in your chest. °· Your speech is slurred or you have trouble moving an arm or leg on one side of your body. °· You get a serious muscle cramp that does not go away. °· Your fingers or toes turn cold or blue. °MAKE SURE YOU:  °· Understand these instructions.   °· Will watch your condition.   °· Will get help right away if you are not doing well or get worse. °Document Released: 11/20/2012 Document Reviewed: 11/20/2012 °[employer]® Patient Information ©2015 [employer]. This information is not  intended to replace advice given to you by your health care provider. Make sure you discuss any questions you have with your health care provider. ° °

## 2013-08-21 NOTE — Transfer of Care (Signed)
Immediate Anesthesia Transfer of Care Note  Patient: Terry [name]  Procedure(s) Performed: Procedure(s): CARDIOVERSION (N/A)  Patient Location: Endoscopy Unit  Anesthesia Type:MAC  Level of Consciousness: awake, alert  and oriented  Airway & Oxygen Therapy: Patient Spontanous Breathing and Patient connected to nasal cannula oxygen  Post-op Assessment: Report given to PACU RN, Post -op Vital signs reviewed and stable and Patient moving all extremities  Post vital signs: Reviewed and stable  Complications: No apparent anesthesia complications

## 2013-08-21 NOTE — Interval H&P Note (Signed)
History and Physical Interval Note:  08/21/2013 10:49 AM  Terry [name]  has presented today for surgery, with the diagnosis of AFIB  The various methods of treatment have been discussed with the patient and family. After consideration of risks, benefits and other options for treatment, the patient has consented to  Procedure(s): CARDIOVERSION (N/A) as a surgical intervention .  The patient's history has been reviewed, patient examined, no change in status, stable for surgery.  I have reviewed the patient's chart and labs.  Questions were answered to the patient's satisfaction.     Tyiesha Brackney R

## 2013-08-21 NOTE — Anesthesia Postprocedure Evaluation (Signed)
  Anesthesia Post-op Note  Patient: Terry [name]  Procedure(s) Performed: Procedure(s): CARDIOVERSION (N/A)  Patient Location: Endoscopy Unit  Anesthesia Type:MAC  Level of Consciousness: awake, alert  and oriented  Airway and Oxygen Therapy: Patient Spontanous Breathing and Patient connected to nasal cannula oxygen  Post-op Pain: none  Post-op Assessment: Post-op Vital signs reviewed, Patient's Cardiovascular Status Stable, Respiratory Function Stable and No signs of Nausea or vomiting  Post-op Vital Signs: Reviewed and stable  Last Vitals:  Filed Vitals:   08/21/13 1146  BP: 126/82  Pulse: 81  Resp: 14    Complications: No apparent anesthesia complications

## 2013-08-21 NOTE — Anesthesia Preprocedure Evaluation (Addendum)
Anesthesia Evaluation  Patient identified by MRN, date of birth, ID band Patient awake    Reviewed: Allergy & Precautions, H&P , NPO status , Patient's Chart, lab work & pertinent test results, reviewed documented beta blocker date and time   History of Anesthesia Complications (+) history of anesthetic complications (irregular heart beat >20 years ago after urinary stricture surgery)  Airway Mallampati: I TM Distance: >3 FB Neck ROM: Full    Dental  (+) Teeth Intact, Dental Advisory Given   Pulmonary neg pulmonary ROS,    Pulmonary exam normal       Cardiovascular hypertension, Pt. on home beta blockers + Peripheral Vascular Disease and +CHF + dysrhythmias Atrial Fibrillation Rhythm:Irregular Rate:Tachycardia     Neuro/Psych negative neurological ROS  negative psych ROS   GI/Hepatic hiatal hernia, GERD- (only with food)  Controlled,(+) Hepatitis - (as a child)  Endo/Other    Renal/GU      Musculoskeletal   Abdominal Normal abdominal exam  (+)   Peds  Hematology   Anesthesia Other Findings   Reproductive/Obstetrics                          Anesthesia Physical Anesthesia Plan  ASA: III  Anesthesia Plan: MAC   Post-op Pain Management:    Induction: Intravenous  Airway Management Planned: Mask  Additional Equipment:   Intra-op Plan:   Post-operative Plan:   Informed Consent: I have reviewed the patients History and Physical, chart, labs and discussed the procedure including the risks, benefits and alternatives for the proposed anesthesia with the patient or authorized representative who has indicated his/her understanding and acceptance.   Dental advisory given  Plan Discussed with: CRNA and Anesthesiologist  Anesthesia Plan Comments:         Anesthesia Quick Evaluation

## 2013-08-21 NOTE — H&P (Signed)
[address] [place], [place]  [address] Phone: [phone] Fax:  [phone]  Date:  08/13/2013   ID:  Terry [name], DOB 08/16/1955, MRN [id number]  PCP:  HARRIS, WILLIAM, MD  Cardiologist:  Keyarra Rendall, MD     History of Present Illness: Terry [name] is a 57 y.o. male with a history of nonischemic DCM, HTN, chronic systolic CHF and PAF who presents today for followup. He is doing well. He denies any chest pain, SOB, DOE, dizziness, palpitations or syncope. He has had a cold recently. He has a history of chronic LE edema which is very well controlled and has improved.     Wt Readings from Last 3 Encounters:  08/13/13 220 lb (99.791 kg)  07/03/13 217 lb (98.431 kg)  01/01/13 222 lb 1.9 oz (100.753 kg)     Past Medical History  Diagnosis Date  . Complication of anesthesia     hx of irregular beat after anesthesia > 20 yrs ago   . Chronic kidney disease     hx of uti   . GERD (gastroesophageal reflux disease)   . H/O hiatal hernia   . Arthritis     rheumatoid  . Hepatitis     hx of as a child   . Aortic insufficiency     mild   . Ejection fraction < 50%     by echo 08/25/11   . H/O hematuria   . 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 Hypertension   . Dysrhythmia 2013    atrial fib   . Chronic systolic CHF (congestive heart failure), NYHA class 1   . Aortic root dilatation     Current Outpatient Prescriptions  Medication Sig Dispense Refill  . carvedilol (COREG) 25 MG tablet Take 25 mg by mouth 2 (two) times daily with a meal.      . furosemide (LASIX) 40 MG tablet Take 40 mg by mouth 2 (two) times daily.      . potassium chloride SA (K-DUR,KLOR-CON) 20 MEQ tablet Take 1 tablet (20 mEq total) by mouth 2 (two) times daily.  180 tablet  3  . ramipril (ALTACE) 2.5 MG capsule Take 1 capsule (2.5 mg total) by mouth daily with breakfast.  90 capsule  3  . ramipril (ALTACE) 5 MG capsule Take 1 capsule (5 mg total) by mouth daily with breakfast.  90  capsule  3  . rivaroxaban (XARELTO) 20 MG TABS tablet Take 1 tablet (20 mg total) by mouth daily with supper.  30 tablet  11   No current facility-administered medications for this visit.    Allergies:    Allergies  Allergen Reactions  . Sulfa Drugs Cross Reactors Hives and Other (See Comments)    thrush    Social History:  The patient  reports that he has never smoked. He has never used smokeless tobacco. He reports that he drinks about .6 ounces of alcohol per week. He reports that he does not use illicit drugs.   Family History:  The patient's family history includes Heart attack in his father; Heart disease in his father; Heart failure in his brother.   ROS:  Please see the history of present illness.      All other systems reviewed and negative.   PHYSICAL EXAM: VS:  BP 114/80  Pulse 98  Ht 5' 11" (1.803 m)  Wt 220 lb (99.791 kg)  BMI 30.70 kg/m2 Well nourished, well developed, in   no acute distress HEENT: normal Neck: no JVD Cardiac:  normal S1, S2; RRR; no murmur Lungs:  clear to auscultation bilaterally, no wheezing, rhonchi or rales Abd: soft, nontender, no hepatomegaly Ext: trace edema Skin: warm and dry Neuro:  CNs 2-12 intact, no focal abnormalities noted  EKG:  Atrial fibrillation with CVR, LAFB     ASSESSMENT AND PLAN:  1. Nonischemic DCM 2. HTN - controlled - continue Carvedilol/Ramipril  3. PAF- now back in afib with CVR - continue Carvedilol/ Xarelto  - will set up for DCCV 4. Mild aortic root dilatation followed with yearly echo 5. Chronic systolic CHF well compensated with chronic LE edem which is stable- appears compensated - continue Lasix   Followup with me after DCCV  Signed, Dian Laprade, MD 08/13/2013 9:50 AM  

## 2013-08-22 ENCOUNTER — Encounter (HOSPITAL_COMMUNITY): Payer: Self-pay | Admitting: Cardiology

## 2013-08-29 NOTE — Telephone Encounter (Signed)
Placed xarelto samples back in closet

## 2013-09-05 ENCOUNTER — Encounter: Payer: Self-pay | Admitting: Cardiology

## 2013-09-05 ENCOUNTER — Ambulatory Visit (INDEPENDENT_AMBULATORY_CARE_PROVIDER_SITE_OTHER): Payer: 59 | Admitting: Cardiology

## 2013-09-05 VITALS — BP 120/75 | HR 66 | Ht 71.0 in | Wt 219.0 lb

## 2013-09-05 DIAGNOSIS — I5022 Chronic systolic (congestive) heart failure: Secondary | ICD-10-CM

## 2013-09-05 DIAGNOSIS — I480 Paroxysmal atrial fibrillation: Secondary | ICD-10-CM

## 2013-09-05 DIAGNOSIS — I4891 Unspecified atrial fibrillation: Secondary | ICD-10-CM

## 2013-09-05 DIAGNOSIS — I509 Heart failure, unspecified: Secondary | ICD-10-CM

## 2013-09-05 DIAGNOSIS — I10 Essential (primary) hypertension: Secondary | ICD-10-CM

## 2013-09-05 NOTE — Progress Notes (Signed)
[address] [place], [place]  [address] Phone: [phone] Fax:  [phone]  Date:  09/05/2013   ID:  Terry [name], DOB 10-10-1955, MRN [id number]  PCP:  [name], MD  Cardiologist:  Armanda Magic, MD     History of Present Illness: Terry [name] is a 58 y.o. male with a history of nonischemic DCM, HTN, chronic systolic CHF and PAF who presents today for followup. He is doing well. He denies any chest pain, SOB, DOE, dizziness, palpitations or syncope. He has had a cold recently. He has a history of chronic LE edema which is very well controlled and has improved. He recently underwent DCCV to NSR and now presents back for followup,  He is doing well.  He is feeling much better since getting back in NSR.    Wt Readings from Last 3 Encounters:  09/05/13 219 lb (99.338 kg)  08/21/13 220 lb (99.791 kg)  08/21/13 220 lb (99.791 kg)     Past Medical History  Diagnosis Date  . Complication of anesthesia     hx of irregular beat after anesthesia > 20 yrs ago   . Chronic kidney disease     hx of uti   . GERD (gastroesophageal reflux disease)   . H/O hiatal hernia   . Arthritis     rheumatoid  . Hepatitis     hx of as a child   . Aortic insufficiency     mild   . Ejection fraction < 50%     by echo 08/25/11   . H/O hematuria   . 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 Hypertension   . Dysrhythmia 2013    atrial fib   . Chronic systolic CHF (congestive heart failure), NYHA class 1   . Aortic root dilatation     Current Outpatient Prescriptions  Medication Sig Dispense Refill  . carvedilol (COREG) 25 MG tablet Take 25 mg by mouth 2 (two) times daily with a meal.      . furosemide (LASIX) 40 MG tablet Take 40 mg by mouth 2 (two) times daily.      . hydrocortisone cream 1 % Apply 1 application topically 3 (three) times daily as needed for itching (skin irritation).  30 g  0  . potassium chloride SA (K-DUR,KLOR-CON) 20 MEQ tablet Take 1 tablet (20  mEq total) by mouth 2 (two) times daily.  180 tablet  3  . ramipril (ALTACE) 2.5 MG capsule Take 2.5 mg by mouth daily. Take with 5mg  capsule for a total of 7.5 mg daily      . ramipril (ALTACE) 5 MG capsule Take 5 mg by mouth daily. Take with 2.5mg  for a total of 7.5mg  daily      . rivaroxaban (XARELTO) 20 MG TABS tablet Take 1 tablet (20 mg total) by mouth daily with supper.  30 tablet  11   No current facility-administered medications for this visit.    Allergies:    Allergies  Allergen Reactions  . Sulfa Drugs Cross Reactors Hives and Other (See Comments)    thrush    Social History:  The patient  reports that he has never smoked. He has never used smokeless tobacco. He reports that he drinks about .6 ounces of alcohol per week. He reports that he does not use illicit drugs.   Family History:  The patient's family history includes Heart attack in his father; Heart disease in his father; Heart  failure in his brother.   ROS:  Please see the history of present illness.      All other systems reviewed and negative.   PHYSICAL EXAM: VS:  BP 120/75  Pulse 66  Ht 5\' 11"  (1.803 m)  Wt 219 lb (99.338 kg)  BMI 30.56 kg/m2 Well nourished, well developed, in no acute distress HEENT: normal Neck: no JVD Cardiac:  normal S1, S2; RRR; no murmur Lungs:  clear to auscultation bilaterally, no wheezing, rhonchi or rales Abd: soft, nontender, no hepatomegaly Ext: trace edema Skin: warm and dry Neuro:  CNs 2-12 intact, no focal abnormalities noted  EKG:     NSR with T wave inversions in III and aVF  ASSESSMENT AND PLAN:  1. Nonischemic DCM 2. HTN - controlled - continue Carvedilol/Ramipril  3. PAF- s/p recent DCCV- maintaining NSR - continue Carvedilol/ Xarelto  4. Mild aortic root dilatation followed with yearly echo 5. Chronic systolic CHF well compensated with chronic LE edem which is stable- appears compensated - continue Lasix   Followup with me in 6 months  Signed, , MD 09/05/2013 9:34 AM

## 2013-09-05 NOTE — Patient Instructions (Signed)
Your physician recommends that you continue on your current medications as directed. Please refer to the Current Medication list given to you today.  Your physician wants you to follow-up in: 6 months with Dr Turner You will receive a reminder letter in the mail two months in advance. If you don't receive a letter, please call our office to schedule the follow-up appointment.  

## 2013-12-01 ENCOUNTER — Other Ambulatory Visit: Payer: Self-pay | Admitting: Cardiology

## 2014-01-19 ENCOUNTER — Encounter: Payer: Self-pay | Admitting: Cardiology

## 2014-01-19 ENCOUNTER — Telehealth: Payer: Self-pay | Admitting: Cardiology

## 2014-01-19 NOTE — Telephone Encounter (Signed)
New message      Pt is having a colonoscopy on 01-26-14.  Need clearance to stop xarelto 3 days prior.  Please fax clearance to [phone].

## 2014-01-19 NOTE — Telephone Encounter (Signed)
Called Courtney in Dr. Florina Ou office.  He is requesting that pt stop Xarelto 3 days prior to colonoscopy that is scheduled for 12/14. Advised Dr. Mayford Knife not in office today but will forward to her for recommendations.

## 2014-01-19 NOTE — Telephone Encounter (Signed)
Called Courtney back and advised is OK to stop Xarelto 3 days prior to colonoscopy per Dr. Mayford Knife. Faxed her comment to Courtney/Dr.Hayes's office at [phone]

## 2014-01-19 NOTE — Telephone Encounter (Signed)
OK to hold Xarelto for 3 days prior to colonoscopy

## 2014-02-21 ENCOUNTER — Other Ambulatory Visit: Payer: Self-pay | Admitting: Cardiology

## 2014-03-05 ENCOUNTER — Other Ambulatory Visit (HOSPITAL_COMMUNITY): Payer: Self-pay | Admitting: Cardiology

## 2014-03-05 DIAGNOSIS — I359 Nonrheumatic aortic valve disorder, unspecified: Secondary | ICD-10-CM

## 2014-03-09 ENCOUNTER — Ambulatory Visit: Payer: 59 | Admitting: Cardiology

## 2014-03-09 ENCOUNTER — Other Ambulatory Visit (HOSPITAL_COMMUNITY): Payer: 59

## 2014-03-10 ENCOUNTER — Ambulatory Visit (HOSPITAL_COMMUNITY): Payer: 59 | Attending: Cardiology | Admitting: Radiology

## 2014-03-10 ENCOUNTER — Ambulatory Visit (INDEPENDENT_AMBULATORY_CARE_PROVIDER_SITE_OTHER): Payer: 59 | Admitting: Cardiology

## 2014-03-10 ENCOUNTER — Encounter: Payer: Self-pay | Admitting: Cardiology

## 2014-03-10 VITALS — BP 112/80 | HR 81 | Ht 71.0 in | Wt 222.1 lb

## 2014-03-10 DIAGNOSIS — I359 Nonrheumatic aortic valve disorder, unspecified: Secondary | ICD-10-CM | POA: Insufficient documentation

## 2014-03-10 DIAGNOSIS — I10 Essential (primary) hypertension: Secondary | ICD-10-CM

## 2014-03-10 DIAGNOSIS — I351 Nonrheumatic aortic (valve) insufficiency: Secondary | ICD-10-CM

## 2014-03-10 DIAGNOSIS — I480 Paroxysmal atrial fibrillation: Secondary | ICD-10-CM

## 2014-03-10 DIAGNOSIS — I429 Cardiomyopathy, unspecified: Secondary | ICD-10-CM

## 2014-03-10 DIAGNOSIS — I420 Dilated cardiomyopathy: Secondary | ICD-10-CM

## 2014-03-10 DIAGNOSIS — I5022 Chronic systolic (congestive) heart failure: Secondary | ICD-10-CM

## 2014-03-10 DIAGNOSIS — I77810 Thoracic aortic ectasia: Secondary | ICD-10-CM

## 2014-03-10 LAB — BASIC METABOLIC PANEL
BUN: 13 mg/dL (ref 6–23)
CO2: 25 meq/L (ref 19–32)
CREATININE: 1 mg/dL (ref 0.40–1.50)
Calcium: 9.3 mg/dL (ref 8.4–10.5)
Chloride: 105 mEq/L (ref 96–112)
GFR: 81.4 mL/min (ref >60.00)
Glucose, Bld: 120 mg/dL — ABNORMAL HIGH (ref 70–99)
Potassium: 4 mEq/L (ref 3.5–5.1)
SODIUM: 137 meq/L (ref 135–145)

## 2014-03-10 NOTE — Progress Notes (Signed)
Echocardiogram performed.  

## 2014-03-10 NOTE — Progress Notes (Signed)
Cardiology Office Note   Date:  03/10/2014   ID:  Terry [name], DOB 03-Dec-1955, MRN [id number]  PCP:  [name], MD  Cardiologist:   Quintella Reichert, MD   Chief Complaint  Patient presents with  . Follow-up    A-Fib      History of Present Illness: Terry [name] is a 59 y.o. male with a history of nonischemic DCM, HTN, chronic systolic CHF and PAF who presents today for followup. He is doing well. He denies any chest pain, SOB, DOE, dizziness, palpitations or syncope.  He had chronic LE edema which he thinks has improved some.  He is doing well. He is feeling much better since getting back in NSR.    Past Medical History  Diagnosis Date  . Complication of anesthesia     hx of irregular beat after anesthesia > 20 yrs ago   . Chronic kidney disease     hx of uti   . GERD (gastroesophageal reflux disease)   . H/O hiatal hernia   . Arthritis     rheumatoid  . Hepatitis     hx of as a child   . Aortic insufficiency     mild   . Ejection fraction < 50%     by echo 08/25/11   . H/O hematuria   . 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 Hypertension   . Dysrhythmia 2013    atrial fib   . Chronic systolic CHF (congestive heart failure), NYHA class 1   . Aortic root dilatation     Past Surgical History  Procedure Laterality Date  . Deviatede septum,    . Urinary stricture     . 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 Colonoscopy      x2  . Total hip arthroplasty  09/08/2011    Procedure: TOTAL HIP ARTHROPLASTY ANTERIOR APPROACH;  Surgeon: [name], MD;  Location: [facility];  Service: Orthopedics;  Laterality: Right;  Right total hip replacement, left knee steroid injection  . Total knee arthroplasty Left 11/29/2012    Procedure: LEFT TOTAL KNEE ARTHROPLASTY;  Surgeon: [name], MD;  Location: [facility];  Service: Orthopedics;  Laterality: Left;  FEMORAL NERVE BLOCK IN HOLDING AREA LEFT LEG  . Cardiac  catheterization      normal  . Cardioversion N/A 08/21/2013    Procedure: CARDIOVERSION;  Surgeon: Quintella Reichert, MD;  [facility]: [facility];  Service: Cardiovascular;  Laterality: N/A;     Current Outpatient Prescriptions  Medication Sig Dispense Refill  . carvedilol (COREG) 25 MG tablet TAKE 1 TABLET TWICE A DAY 180 tablet 1  . furosemide (LASIX) 40 MG tablet TAKE 1 TABLET TWICE A DAY 180 tablet 1  . hydrocortisone cream 1 % Apply 1 application topically 3 (three) times daily as needed for itching (skin irritation). 30 g 0  . potassium chloride SA (K-DUR,KLOR-CON) 20 MEQ tablet TAKE 1 TABLET TWICE A DAY 180 tablet 0  . ramipril (ALTACE) 2.5 MG capsule Take 2.5 mg by mouth daily. Take with 5mg  capsule for a total of 7.5 mg daily    . ramipril (ALTACE) 5 MG capsule Take 5 mg by mouth daily. Take with 2.5mg  for a total of 7.5mg  daily    . rivaroxaban (XARELTO) 20 MG TABS tablet Take 1 tablet (20 mg total) by mouth daily with supper. 30 tablet 11   No  current facility-administered medications for this visit.    Allergies:   Sulfa drugs cross reactors    Social History:  The patient  reports that he has never smoked. He has never used smokeless tobacco. He reports that he drinks about 0.6 oz of alcohol per week. He reports that he does not use illicit drugs.   Family History:  The patient's family history includes Heart attack in his father; Heart disease in his father; Heart failure in his brother.    ROS:  Please see the history of present illness.   Otherwise, review of systems are positive for none.   All other systems are reviewed and negative.    PHYSICAL EXAM: VS:  BP 112/80 mmHg  Pulse 81  Ht 5\' 11"  (1.803 m)  Wt 222 lb 1.9 oz (100.753 kg)  BMI 30.99 kg/m2  SpO2 98% , BMI Body mass index is 30.99 kg/(m^2). GEN: Well nourished, well developed, in no acute distress HEENT: normal Neck: no JVD, carotid bruits, or masses Cardiac: RRR; no murmurs, rubs, or gallops,no edema    Respiratory:  clear to auscultation bilaterally, normal work of breathing GI: soft, nontender, nondistended, + BS MS: no deformity or atrophy Skin: warm and dry, no rash Neuro:  Strength and sensation are intact Psych: euthymic mood, full affect   EKG:  EKG was ordered today and showed atrial fibrillation with HR 94bpm with T wave abnormality in the inferolateral leads.     Recent Labs: 08/18/2013: BUN 12; Creatinine 1.0; Hemoglobin 12.2*; Platelets 291.0; Potassium 4.1; Sodium 138    Lipid Panel No results found for: CHOL, TRIG, HDL, CHOLHDL, VLDL, LDLCALC, LDLDIRECT    Wt Readings from Last 3 Encounters:  03/10/14 222 lb 1.9 oz (100.753 kg)  09/05/13 219 lb (99.338 kg)  08/21/13 220 lb (99.791 kg)      Other studies Reviewed: Additional studies/ records that were reviewed today include: None.  ASSESSMENT AND PLAN:  1. Nonischemic DCM 2. HTN - controlled - continue Carvedilol/Ramipril  3. PAF- s/p recent DCCV- now back in Afib with CVR.  I have discussed with him treatment options including rate vs. Rhythm control.  He is completely asymptomatic with his afib and has been cardioverted several times.  We discussed antiarrythmic therapy but since he is asymptomatic we are going to pursue rate control.   - continue Carvedilol/ Xarelto  4. Mild aortic root dilatation followed with yearly echo - he will have this done today 5. Chronic systolic CHF well compensated with chronic LE edema which is stable- appears compensated - continue Lasix  - check BMET    Current medicines are reviewed at length with the patient today.  The patient does not have concerns regarding medicines.  The following changes have been made:  no change  Labs/ tests ordered today include: BMET  No orders of the defined types were placed in this encounter.     Disposition:   FU with me in 6 months   Signed, 10/22/13, MD  03/10/2014 8:24 AM    [facility] [address] [place], [place], [place]  [place] Phone: [phone]; Fax: [phone]

## 2014-03-10 NOTE — Patient Instructions (Signed)
Your physician recommends that you continue on your current medications as directed. Please refer to the Current Medication list given to you today.  Your physician recommends that you have lab work TODAY (BMET).  Your physician wants you to follow-up in: 6 months with Dr. Turner. You will receive a reminder letter in the mail two months in advance. If you don't receive a letter, please call our office to schedule the follow-up appointment.  

## 2014-03-11 ENCOUNTER — Encounter: Payer: Self-pay | Admitting: Cardiology

## 2014-03-11 NOTE — Telephone Encounter (Signed)
This encounter was created in error - please disregard.

## 2014-03-12 ENCOUNTER — Telehealth: Payer: Self-pay

## 2014-03-12 DIAGNOSIS — I420 Dilated cardiomyopathy: Secondary | ICD-10-CM

## 2014-03-12 DIAGNOSIS — I10 Essential (primary) hypertension: Secondary | ICD-10-CM

## 2014-03-12 DIAGNOSIS — I77810 Thoracic aortic ectasia: Secondary | ICD-10-CM

## 2014-03-12 MED ORDER — SPIRONOLACTONE 25 MG PO TABS: 12.5000 mg | ORAL_TABLET | Freq: Every day | ORAL | Status: DC

## 2014-03-12 NOTE — Telephone Encounter (Signed)
Patient informed of results and verbal understanding expressed.  Instructed patient to START aldactone 12.5 daily. BMET scheduled for next Wednesday.  Repeat ECHO ordered to be scheduled in 3 months.  Patient agrees with treatment plan.

## 2014-03-12 NOTE — Telephone Encounter (Signed)
-----   Message from Quintella Reichert, MD sent at 03/11/2014 10:11 PM EST ----- Please disregard prior report of echo findings and recommendations for MRI.  This was placed erroneously on the wrong patient.  Please let patient know that his heart function has declined some from prior echo and now EF is 35-40%.  RV systolic function was also reduced.  Dilated aortic root is stable.    Please add aldactone 12.5mg  daily and check BMET in 1 week.  Repeat echo in 3 months to reassess LVF>

## 2014-03-16 ENCOUNTER — Telehealth: Payer: Self-pay | Admitting: Cardiology

## 2014-03-16 NOTE — Telephone Encounter (Signed)
New problem   Pt stated the drug he was put on last week Spirolanten . He need to talk to you about it because of what he had read. Please call pt.

## 2014-03-16 NOTE — Telephone Encounter (Signed)
Patient concerned because he is on Aldactone and lasix. Explained to patient that since he is still on Lasix, he will stay on potassium for now unless his labs show we need to decrease. Labs confirmed for later this week. Patient agrees with treatment plan.

## 2014-03-17 ENCOUNTER — Telehealth: Payer: Self-pay | Admitting: Cardiology

## 2014-03-17 NOTE — Telephone Encounter (Signed)
New message     Lt side of face and rt shoulder feels like a "twitch".  It is not all of the time.  This started a couple of days ago.  He started a new drug last thurs (spironolactone)----could this be causing the "twitch"?

## 2014-03-17 NOTE — Telephone Encounter (Signed)
Please have him make an appt with his PCP

## 2014-03-17 NOTE — Telephone Encounter (Signed)
Reviewed with pharmacist and twitching is not a side effect of spironolactone unless there is an electrolyte imbalance.  I spoke with pt and gave him this information. He reports twitching on left side of face and under right arm pit.  Started a couple of days ago. He is OK during the morning but twitching starts after he has been at work for [employer]. Goes away in the evening. Reports being stressed. No pain. No visual disturbance. No other complaints.  He is aware of lab work (BMP) scheduled for 2/3 and will be here about 3:45 for labs.  He would like to make Dr. Mayford Knife aware of twitching.

## 2014-03-18 ENCOUNTER — Other Ambulatory Visit (INDEPENDENT_AMBULATORY_CARE_PROVIDER_SITE_OTHER): Payer: 59 | Admitting: *Deleted

## 2014-03-18 DIAGNOSIS — I10 Essential (primary) hypertension: Secondary | ICD-10-CM

## 2014-03-18 NOTE — Telephone Encounter (Signed)
The pt is advised and he verbalized understanding. He states that he is not having any twitching at this time but states that he will contact his PCP if he does.

## 2014-03-19 ENCOUNTER — Telehealth: Payer: Self-pay | Admitting: *Deleted

## 2014-03-19 LAB — BASIC METABOLIC PANEL
BUN: 16 mg/dL (ref 6–23)
CO2: 27 mEq/L (ref 19–32)
Calcium: 9.4 mg/dL (ref 8.4–10.5)
Chloride: 100 mEq/L (ref 96–112)
Creatinine, Ser: 1.14 mg/dL (ref 0.40–1.50)
GFR: 69.97 mL/min (ref >60.00)
Glucose, Bld: 76 mg/dL (ref 70–99)
Potassium: 4.5 mEq/L (ref 3.5–5.1)
Sodium: 136 mEq/L (ref 135–145)

## 2014-03-19 NOTE — Telephone Encounter (Signed)
pt notified about normal lab results. Pt asked if ok to drink gatorade once in awhile, I advised he really should not due to CHF and gatorade has alot of salt. Pt said thank you he just wants to do everything right.

## 2014-05-10 ENCOUNTER — Other Ambulatory Visit: Payer: Self-pay | Admitting: Cardiology

## 2014-05-19 ENCOUNTER — Other Ambulatory Visit: Payer: Self-pay | Admitting: *Deleted

## 2014-05-19 MED ORDER — RAMIPRIL 5 MG PO CAPS: ORAL_CAPSULE | ORAL | Status: DC

## 2014-06-04 ENCOUNTER — Encounter: Payer: Self-pay | Admitting: Cardiology

## 2014-06-15 ENCOUNTER — Other Ambulatory Visit: Payer: Self-pay | Admitting: *Deleted

## 2014-06-15 MED ORDER — RIVAROXABAN 20 MG PO TABS: 20.0000 mg | ORAL_TABLET | Freq: Every day | ORAL | Status: DC

## 2014-06-23 ENCOUNTER — Other Ambulatory Visit: Payer: Self-pay

## 2014-06-23 ENCOUNTER — Ambulatory Visit (HOSPITAL_COMMUNITY): Payer: 59 | Attending: Cardiovascular Disease

## 2014-06-23 ENCOUNTER — Other Ambulatory Visit (HOSPITAL_COMMUNITY): Payer: 59

## 2014-06-23 DIAGNOSIS — I420 Dilated cardiomyopathy: Secondary | ICD-10-CM

## 2014-06-23 DIAGNOSIS — I77810 Thoracic aortic ectasia: Secondary | ICD-10-CM | POA: Diagnosis not present

## 2014-06-23 DIAGNOSIS — I429 Cardiomyopathy, unspecified: Secondary | ICD-10-CM | POA: Insufficient documentation

## 2014-06-23 DIAGNOSIS — I10 Essential (primary) hypertension: Secondary | ICD-10-CM | POA: Insufficient documentation

## 2014-06-25 ENCOUNTER — Telehealth: Payer: Self-pay

## 2014-06-25 DIAGNOSIS — R931 Abnormal findings on diagnostic imaging of heart and coronary circulation: Secondary | ICD-10-CM

## 2014-06-25 NOTE — Telephone Encounter (Signed)
Follow UP  Pt calling Terry [name] back due to the call being dropped- Please call back and dsicuss.

## 2014-06-25 NOTE — Telephone Encounter (Signed)
Called pt back and let him know that orders for labs were being placed and that he could go to the [place] lab to have these drawn tomorrow. Pt verbalized understanding and was in agreement.

## 2014-06-25 NOTE — Telephone Encounter (Signed)
Orders placed and message sent to EP scheduler.

## 2014-06-25 NOTE — Telephone Encounter (Signed)
-----   Message from Quintella Reichert, MD sent at 06/23/2014  9:40 AM EDT ----- Please let patient know that echo showed severely reduced LVF with EF 25% and mild MR.  He has been on medical therapy for 3 months with no improvement in EF.  He had a normal cath in 2006 at time of diagnosis of DCM.  EF has varied throughout the years.  It has declined recently of unknown etiology.  Please send off a ferritin level, serum protein electrophoresis and HIV to assess for secondary causes of reduced EF.  Please refer him to EP to evaluate for need for possible prophylactic AICD

## 2014-06-26 ENCOUNTER — Other Ambulatory Visit: Payer: 59

## 2014-06-26 DIAGNOSIS — R931 Abnormal findings on diagnostic imaging of heart and coronary circulation: Secondary | ICD-10-CM

## 2014-06-26 LAB — HIV ANTIBODY (ROUTINE TESTING W REFLEX): HIV 1&2 Ab, 4th Generation: NONREACTIVE

## 2014-06-26 LAB — FERRITIN: Ferritin: 179 ng/mL (ref 22–322)

## 2014-06-28 ENCOUNTER — Encounter: Payer: Self-pay | Admitting: Cardiology

## 2014-06-29 ENCOUNTER — Telehealth: Payer: Self-pay

## 2014-06-29 NOTE — Telephone Encounter (Signed)
Informed patient of results and verbal understanding expressed.    The patient sent a MyChart message: "Dr Mayford Knife , I was going thru some paperwork and paying bills as normal and really wonder from the slowness in pumping if the spirolactone and Zarelto could be causing my slow down as after starting these this started . On the spirolactone I have notice more of a slowdown as it states on the prescription warning , also some slower urination of more at times , nipples are more sensitive and seems a effect on sex drive . I try to do everything you want me too . I am very scared at times as this seems to have started after some of the addl drugs . Thanks for listening to me sound out."  Per Dr. Mayford Knife, "The xarelto would not cause these symptoms but the spironolactone could cause nipple tenderness. Please stop the spironolactone."  Patient agrees with treatment plan and is thankful for follow-up.

## 2014-06-29 NOTE — Telephone Encounter (Signed)
-----   Message from Quintella Reichert, MD sent at 06/27/2014  6:27 PM EDT ----- HIV negative and ferritin level is normal.  Awaiting results of proten electrophoresis

## 2014-06-30 LAB — PROTEIN ELECTROPHORESIS, SERUM
Albumin ELP: 3.9 g/dL (ref 3.8–4.8)
Alpha-1-Globulin: 0.6 g/dL — ABNORMAL HIGH (ref 0.2–0.3)
Alpha-2-Globulin: 0.8 g/dL (ref 0.5–0.9)
Beta 2: 0.5 g/dL (ref 0.2–0.5)
Beta Globulin: 0.5 g/dL (ref 0.4–0.6)
Gamma Globulin: 1.3 g/dL (ref 0.8–1.7)
Total Protein, Serum Electrophoresis: 7.5 g/dL (ref 6.1–8.1)

## 2014-07-01 ENCOUNTER — Ambulatory Visit (INDEPENDENT_AMBULATORY_CARE_PROVIDER_SITE_OTHER): Payer: 59 | Admitting: Internal Medicine

## 2014-07-01 ENCOUNTER — Encounter: Payer: Self-pay | Admitting: Internal Medicine

## 2014-07-01 VITALS — BP 95/74 | HR 83 | Ht 71.0 in | Wt 213.0 lb

## 2014-07-01 DIAGNOSIS — R931 Abnormal findings on diagnostic imaging of heart and coronary circulation: Secondary | ICD-10-CM | POA: Diagnosis not present

## 2014-07-01 DIAGNOSIS — I10 Essential (primary) hypertension: Secondary | ICD-10-CM

## 2014-07-01 DIAGNOSIS — I482 Chronic atrial fibrillation, unspecified: Secondary | ICD-10-CM

## 2014-07-01 DIAGNOSIS — I5022 Chronic systolic (congestive) heart failure: Secondary | ICD-10-CM | POA: Diagnosis not present

## 2014-07-01 NOTE — Patient Instructions (Signed)
Medication Instructions:  Your physician recommends that you continue on your current medications as directed. Please refer to the Current Medication list given to you today.  Labwork: None ordered  Testing/Procedures: Your physician has recommended that you have a defibrillator inserted. . This device uses electrical pulses or shocks to help control life-threatening, irregular heartbeats that could lead the heart to suddenly stop beating (sudden cardiac arrest). Leads are attached to the ICD that goes into your heart. This is done in the hospital and usually requires an overnight stay.   Tresa Endo, RN will call you to arrange this procedure  Follow-Up: To be determined once procedure is scheduled.  Thank you for choosing [facility]!!

## 2014-07-01 NOTE — Assessment & Plan Note (Signed)
His blood pressure is controlled. He cannot have additional blood pressure lowering at this point. Will continue his current meds.

## 2014-07-01 NOTE — Assessment & Plan Note (Signed)
He has chronic atrial fibrillation. His rate is controlled and he is tolerating his systemic anticoagulation. Will follow.

## 2014-07-01 NOTE — Assessment & Plan Note (Signed)
He has developed worsening LV dysfunction and has class 2 heart failure symptoms. He is on maximal medical therapy. I have recommended insertion of an ICD. Discussion of a SQ device was had as well. Will screen for a subcutaneous ICD.

## 2014-07-01 NOTE — Progress Notes (Signed)
HPI Terry [name] is referred by Dr. Mayford Knife for evaluation and consideration of ICD implant. He is a pleasant 59 yo man with atrial fibrillation, RA, s/p knee replacement and hip replacement, chronic systolic heart failure who has had progressively worsening symptoms over the past 6 months despite maximal medical therapy. He was cardioverted but maintained NSR only briefly. He did not know that he was in atrial fibrillation on presentation in the past. The patient has never had syncope. His EF is 25% by echo, previously 35%. He does not have peripheral edema. He is referred for additional evaluation.  Allergies  Allergen Reactions  . Sulfa Drugs Cross Reactors Hives and Other (See Comments)    thrush     Current Outpatient Prescriptions  Medication Sig Dispense Refill  . carvedilol (COREG) 25 MG tablet TAKE 1 TABLET TWICE A DAY 180 tablet 1  . furosemide (LASIX) 40 MG tablet TAKE 1 TABLET TWICE A DAY 180 tablet 1  . hydrocortisone cream 1 % Apply 1 application topically 3 (three) times daily as needed for itching (skin irritation). 30 g 0  . potassium chloride SA (K-DUR,KLOR-CON) 20 MEQ tablet TAKE 1 TABLET TWICE A DAY 180 tablet 1  . ramipril (ALTACE) 2.5 MG capsule TAKE 1 CAPSULE DAILY WITH BREAKFAST 90 capsule 1  . ramipril (ALTACE) 5 MG capsule Take one tablet daily with 2.5mg  tablet for a total of 7.5mg  daily 90 capsule 1  . rivaroxaban (XARELTO) 20 MG TABS tablet Take 1 tablet (20 mg total) by mouth daily with supper. 30 tablet 3   No current facility-administered medications for this visit.     Past Medical History  Diagnosis Date  . Complication of anesthesia     hx of irregular beat after anesthesia > 20 yrs ago   . Chronic kidney disease     hx of uti   . GERD (gastroesophageal reflux disease)   . H/O hiatal hernia   . Arthritis     rheumatoid  . Hepatitis     hx of as a child   . Aortic insufficiency     mild   . Ejection fraction < 50%     by echo 08/25/11   .  H/O hematuria   . 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 Hypertension   . Dysrhythmia 2013    atrial fib   . Chronic systolic CHF (congestive heart failure), NYHA class 1   . Aortic root dilatation     ROS:   All systems reviewed and negative except as noted in the HPI.   Past Surgical History  Procedure Laterality Date  . Deviatede septum,    . Urinary stricture     . 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 Colonoscopy      x2  . Total hip arthroplasty  09/08/2011    Procedure: TOTAL HIP ARTHROPLASTY ANTERIOR APPROACH;  Surgeon: [name], MD;  Location: [facility];  Service: Orthopedics;  Laterality: Right;  Right total hip replacement, left knee steroid injection  . Total knee arthroplasty Left 11/29/2012    Procedure: LEFT TOTAL KNEE ARTHROPLASTY;  Surgeon: [name], MD;  Location: [facility];  Service: Orthopedics;  Laterality: Left;  FEMORAL NERVE BLOCK IN HOLDING AREA LEFT LEG  . Cardiac catheterization      normal  . Cardioversion N/A 08/21/2013    Procedure: CARDIOVERSION;  Surgeon: Cornelious Bryant  Mayford Knife, MD;  Location: [facility];  Service: Cardiovascular;  Laterality: N/A;     Family History  Problem Relation Age of Onset  . Heart attack Father   . Heart disease Father   . Heart failure Brother      History   Social History  . Marital Status: Married    Spouse Name: N/A  . Number of Children: N/A  . Years of Education: N/A   Occupational History  . Not on file.   Social History Main Topics  . Smoking status: Never Smoker   . Smokeless tobacco: Never Used  . Alcohol Use: 0.6 oz/week    1 Cans of beer per week     Comment: 1 beer every 2 weeks  . Drug Use: No  . Sexual Activity: Not on file   Other Topics Concern  . Not on file   Social History Narrative     BP 95/74 mmHg  Pulse 83  Ht 5\' 11"  (1.803 m)  Wt 213 lb (96.616 kg)  BMI 29.72 kg/m2  Physical Exam:  Well appearing NAD HEENT:  Unremarkable Neck:  No JVD, no thyromegally Lymphatics:  No adenopathy Back:  No CVA tenderness Lungs:  Clear with no wheezes HEART:  IRegular rate rhythm, no murmurs, no rubs, no clicks Abd:  soft, positive bowel sounds, no organomegally, no rebound, no guarding Ext:  2 plus pulses, no edema, no cyanosis, no clubbing Skin:  No rashes no nodules Neuro:  CN II through XII intact, motor grossly intact  EKG - atrial fib with a controlled VR   Assess/Plan:

## 2014-07-03 ENCOUNTER — Encounter: Payer: Self-pay | Admitting: Internal Medicine

## 2014-07-03 ENCOUNTER — Telehealth: Payer: Self-pay | Admitting: Internal Medicine

## 2014-07-03 NOTE — Telephone Encounter (Signed)
Follow Up       Pt calling stating Dr. [name] or Tresa Endo needs to call [facility] at [phone] because [facility] told pt that our office has not submitted anything and the pt hasn't been denied anything. Pt calling stating that they need to be called today. Please call back and advise.

## 2014-07-03 NOTE — Telephone Encounter (Signed)
Delrae Alfred in pre-cert is calling to submit all the information and will let me know what she hears from [facility]

## 2014-07-03 NOTE — Telephone Encounter (Signed)
New message      Returning a call to Dr Lubertha Basque nurse

## 2014-07-06 ENCOUNTER — Encounter: Payer: Self-pay | Admitting: Internal Medicine

## 2014-07-06 ENCOUNTER — Encounter: Payer: Self-pay | Admitting: Cardiology

## 2014-07-06 NOTE — Telephone Encounter (Signed)
No precert required for CPT 33270 per [facility] online. Submitted notification request to [facility].  REF# [id number].

## 2014-07-07 ENCOUNTER — Other Ambulatory Visit (INDEPENDENT_AMBULATORY_CARE_PROVIDER_SITE_OTHER): Payer: 59 | Admitting: *Deleted

## 2014-07-07 ENCOUNTER — Encounter: Payer: Self-pay | Admitting: Internal Medicine

## 2014-07-07 ENCOUNTER — Telehealth: Payer: Self-pay

## 2014-07-07 DIAGNOSIS — R778 Other specified abnormalities of plasma proteins: Secondary | ICD-10-CM

## 2014-07-07 DIAGNOSIS — R769 Abnormal immunological finding in serum, unspecified: Secondary | ICD-10-CM

## 2014-07-07 NOTE — Addendum Note (Signed)
Addended by: Tonita Phoenix on: 07/07/2014 09:05 AM   Modules accepted: Orders

## 2014-07-07 NOTE — Telephone Encounter (Signed)
IFE ordered for lab draw and verified with lab that patient can come here to have it done.  Left message for patient that appointment has been made for today and for him to call if it needs to be rescheduled.

## 2014-07-07 NOTE — Telephone Encounter (Signed)
-----   Message from Quintella Reichert, MD sent at 07/02/2014  5:25 PM EDT ----- Please order serum immunofixation electrophoresis sue to nonspecific alpha - 1 region increase on serum electrophoresis

## 2014-07-08 ENCOUNTER — Encounter: Payer: Self-pay | Admitting: Internal Medicine

## 2014-07-08 NOTE — Telephone Encounter (Signed)
Terry Go, RN            Good morning.    Unfortunately even after everything we have sent, [name] has been denied. [facility] medical director states their studies do not show that the [name] gives more benefits than the regular ICD.    Dr. [name] can try to perform an MD review at [phone]  Ref# [id number] or we can wait for the appeal paperwork to come through and [name] that route.   Thank you,   Morrie Sheldon       Previous Messages     ----- Message -----   From: Deliah Boston, RN   Sent: 07/06/2014  2:16 PM    To: Carmelina Paddock   Thank you  ----- Message -----   From: Carmelina Paddock   Sent: 07/06/2014 11:42 AM    To: Deliah Boston, RN   I will hopefully have an answer today from [facility]. I faxed them everything and called to make sure they received it. I asked for it to be expedited. I will update you as soon as I hear anything.   ----- Message -----   From: Deliah Boston, RN   Sent: 07/06/2014 10:47 AM    To: Carmelina Paddock   I just forwarded you an email from him. Its all about pre-cert. Let me know or the patient what you have found out about [facility] athe them approving or denying the SQICD   Thanks   [employer]

## 2014-07-09 ENCOUNTER — Encounter: Payer: Self-pay | Admitting: Cardiology

## 2014-07-09 LAB — IMMUNOFIXATION ELECTROPHORESIS
IGA: 340 mg/dL (ref 68–379)
IGM, SERUM: 181 mg/dL (ref 41–251)
IgG (Immunoglobin G), Serum: 1330 mg/dL (ref 650–1600)
TOTAL PROTEIN, SERUM ELECTROPHOR: 7.1 g/dL (ref 6.0–8.3)

## 2014-07-10 ENCOUNTER — Telehealth: Payer: Self-pay | Admitting: Cardiology

## 2014-07-10 ENCOUNTER — Encounter: Payer: Self-pay | Admitting: Cardiology

## 2014-07-10 DIAGNOSIS — I5022 Chronic systolic (congestive) heart failure: Secondary | ICD-10-CM

## 2014-07-10 DIAGNOSIS — I4891 Unspecified atrial fibrillation: Secondary | ICD-10-CM

## 2014-07-10 DIAGNOSIS — I5020 Unspecified systolic (congestive) heart failure: Secondary | ICD-10-CM

## 2014-07-10 NOTE — Telephone Encounter (Signed)
New Prob   Pt is wanting to move forward with defib placement and wanting to get the process going. Please call.

## 2014-07-10 NOTE — Telephone Encounter (Signed)
Left message on his voice mail to call as I have him set up for 07/28/14

## 2014-07-10 NOTE — Telephone Encounter (Signed)
New Prob  Pt states OK to proceed with referral for advanced heart clinic. He is requesting a call back.

## 2014-07-10 NOTE — Telephone Encounter (Signed)
Confirmed with patient it is OK to refer him to [facility] and order placed. Patient requests Tresa Endo to call him with updates on his device.

## 2014-07-14 ENCOUNTER — Encounter (HOSPITAL_COMMUNITY): Payer: Self-pay | Admitting: Internal Medicine

## 2014-07-14 ENCOUNTER — Encounter: Payer: Self-pay | Admitting: Cardiology

## 2014-07-14 ENCOUNTER — Telehealth: Payer: Self-pay | Admitting: Internal Medicine

## 2014-07-14 NOTE — Telephone Encounter (Signed)
Pt also left this message on my voicemail as well as wanting to know what meds to stop taking/how how soon prior to procedure--pls call [phone] or [phone]

## 2014-07-14 NOTE — Telephone Encounter (Signed)
Will obtain labs at the hospital.  Procedure is sch for 08/05/14 at 12.  Arrive at 10.  NPO after MN.  See mychart messages to patient

## 2014-07-14 NOTE — Telephone Encounter (Signed)
New Message  Pt wanted to resch June 14- internal defib implantation procedure. Pt will be out of town. This week or next will be fine. Please call back (using cell # only- [phone]) and discuss.

## 2014-07-14 NOTE — Telephone Encounter (Signed)
See mychart message from today   Moved to 08/05/14

## 2014-07-14 NOTE — Telephone Encounter (Signed)
New Message       Pt calling stating that he just spoke to [facility] and was told that nothing has been submitted from our office to [facility] for his procedure that  is scheduled for 08/05/14. Please call back and advise.

## 2014-07-14 NOTE — Telephone Encounter (Signed)
-----   Message -----  From: Nurse Marton Redwood  Sent: 07/14/2014 10:27 AM EDT  To: Sofie Rower  Subject: RE: Non-Urgent Medical Question   The procedure will go through our precert department as I do not handle any of that. The medications to hold the morning of are Furosemide and the night prior Xarelto. Okay to take all other medications the morning of your procedure with a small sip of water All other instructions are listed in last message. Tresa Endo   Also sent to precert

## 2014-07-15 NOTE — Telephone Encounter (Addendum)
°  Good morning.  [employer] was contacted yesterday and the regular ICD does not require authorization. (CPT [id number]).  The S-ICD was the only one that required authorization under this specific [facility] plan.  Please let me know if there is anything else I can do.  Thank you,  Morrie Sheldon

## 2014-07-15 NOTE — Telephone Encounter (Deleted)
°

## 2014-07-17 ENCOUNTER — Telehealth: Payer: Self-pay | Admitting: Cardiology

## 2014-07-17 ENCOUNTER — Other Ambulatory Visit: Payer: Self-pay

## 2014-07-17 NOTE — Telephone Encounter (Signed)
[employer] RN states that pt has been enrolled in their heart failure program so we may be contacted if there is evidence of fluid overload.

## 2014-07-17 NOTE — Telephone Encounter (Signed)
New message    [facility] calling      Discuss heart failure program

## 2014-07-20 ENCOUNTER — Telehealth: Payer: Self-pay

## 2014-07-20 NOTE — Telephone Encounter (Signed)
Prior auth for Xarelto sent to [facility] via Cover My Meds

## 2014-07-21 ENCOUNTER — Encounter: Payer: Self-pay | Admitting: Cardiology

## 2014-07-27 ENCOUNTER — Telehealth: Payer: Self-pay

## 2014-07-27 NOTE — Telephone Encounter (Signed)
Authorization for Xarelto 20mg  approved from [facility]. Patient informed.

## 2014-08-04 MED ORDER — SODIUM CHLORIDE 0.9 % IV SOLN
INTRAVENOUS | Status: DC
Start: 2014-08-05 — End: 2014-08-05
  Administered 2014-08-05: 12:00:00 via INTRAVENOUS

## 2014-08-04 MED ORDER — SODIUM CHLORIDE 0.9 % IR SOLN
80.0000 mg | Status: AC
  Administered 2014-08-05: 80 mg
  Filled 2014-08-04: qty 2

## 2014-08-04 MED ORDER — CEFAZOLIN SODIUM-DEXTROSE 2-3 GM-% IV SOLR: 2.0000 g | INTRAVENOUS | Status: DC

## 2014-08-05 ENCOUNTER — Encounter (HOSPITAL_COMMUNITY): Payer: 59

## 2014-08-05 ENCOUNTER — Ambulatory Visit (HOSPITAL_COMMUNITY)
Admission: RE | Admit: 2014-08-05 | Discharge: 2014-08-06 | Disposition: A | Discharge status: Home / Self Care | Payer: 59 | Source: Ambulatory Visit | Attending: Internal Medicine | Admitting: Internal Medicine

## 2014-08-05 ENCOUNTER — Encounter (HOSPITAL_COMMUNITY)
Admission: RE | Disposition: A | Discharge status: Home / Self Care | Payer: 59 | Source: Ambulatory Visit | Attending: Internal Medicine

## 2014-08-05 ENCOUNTER — Encounter (HOSPITAL_COMMUNITY): Payer: Self-pay | Admitting: General Practice

## 2014-08-05 DIAGNOSIS — Z95810 Presence of automatic (implantable) cardiac defibrillator: Secondary | ICD-10-CM | POA: Diagnosis not present

## 2014-08-05 DIAGNOSIS — I5022 Chronic systolic (congestive) heart failure: Secondary | ICD-10-CM | POA: Diagnosis not present

## 2014-08-05 DIAGNOSIS — I429 Cardiomyopathy, unspecified: Secondary | ICD-10-CM | POA: Insufficient documentation

## 2014-08-05 DIAGNOSIS — I4891 Unspecified atrial fibrillation: Secondary | ICD-10-CM | POA: Diagnosis not present

## 2014-08-05 DIAGNOSIS — I10 Essential (primary) hypertension: Secondary | ICD-10-CM | POA: Diagnosis not present

## 2014-08-05 HISTORY — DX: Unspecified atrial fibrillation: I48.91

## 2014-08-05 HISTORY — DX: Rheumatoid arthritis, unspecified: M06.9

## 2014-08-05 HISTORY — DX: Anemia, unspecified: D64.9

## 2014-08-05 HISTORY — DX: Presence of automatic (implantable) cardiac defibrillator: Z95.810

## 2014-08-05 HISTORY — DX: Personal history of other medical treatment: Z92.89

## 2014-08-05 HISTORY — PX: EP IMPLANTABLE DEVICE: SHX172B

## 2014-08-05 HISTORY — PX: CARDIAC DEFIBRILLATOR PLACEMENT: SHX171

## 2014-08-05 LAB — BASIC METABOLIC PANEL
Anion gap: 9 (ref 5–15)
BUN: 13 mg/dL (ref 6–20)
CO2: 27 mmol/L (ref 22–32)
CREATININE: 1.08 mg/dL (ref 0.61–1.24)
Calcium: 9.1 mg/dL (ref 8.9–10.3)
Chloride: 104 mmol/L (ref 101–111)
GLUCOSE: 95 mg/dL (ref 65–99)
Potassium: 4.2 mmol/L (ref 3.5–5.1)
Sodium: 140 mmol/L (ref 135–145)

## 2014-08-05 LAB — CBC
HCT: 37.5 % — ABNORMAL LOW (ref 39.0–52.0)
Hemoglobin: 12.7 g/dL — ABNORMAL LOW (ref 13.0–17.0)
MCH: 29.5 pg (ref 26.0–34.0)
MCHC: 33.9 g/dL (ref 30.0–36.0)
MCV: 87.2 fL (ref 78.0–100.0)
PLATELETS: 222 10*3/uL (ref 150–400)
RBC: 4.3 MIL/uL (ref 4.22–5.81)
RDW: 13.3 % (ref 11.5–15.5)
WBC: 7.1 10*3/uL (ref 4.0–10.5)

## 2014-08-05 LAB — SURGICAL PCR SCREEN
MRSA, PCR: NEGATIVE
Staphylococcus aureus: NEGATIVE

## 2014-08-05 SURGERY — ICD IMPLANT
Anesthesia: LOCAL

## 2014-08-05 MED ORDER — CEFAZOLIN SODIUM 1-5 GM-% IV SOLN
1.0000 g | Freq: Four times a day (QID) | INTRAVENOUS | Status: AC
  Administered 2014-08-05 – 2014-08-06 (×3): 1 g via INTRAVENOUS
  Filled 2014-08-05 (×3): qty 50

## 2014-08-05 MED ORDER — POTASSIUM CHLORIDE CRYS ER 20 MEQ PO TBCR
20.0000 meq | EXTENDED_RELEASE_TABLET | Freq: Two times a day (BID) | ORAL | Status: DC
  Administered 2014-08-05: 20 meq via ORAL
  Filled 2014-08-05 (×2): qty 1

## 2014-08-05 MED ORDER — TRAMADOL HCL 50 MG PO TABS
50.0000 mg | ORAL_TABLET | Freq: Four times a day (QID) | ORAL | Status: DC | PRN
  Administered 2014-08-05: 50 mg via ORAL
  Filled 2014-08-05: qty 1

## 2014-08-05 MED ORDER — FENTANYL CITRATE (PF) 100 MCG/2ML IJ SOLN
INTRAMUSCULAR | Status: DC | PRN
  Administered 2014-08-05: 25 ug via INTRAVENOUS
  Administered 2014-08-05 (×2): 12.5 ug via INTRAVENOUS
  Administered 2014-08-05: 25 ug via INTRAVENOUS

## 2014-08-05 MED ORDER — LIDOCAINE HCL (PF) 1 % IJ SOLN
INTRAMUSCULAR | Status: DC | PRN
  Administered 2014-08-05: 50 mL

## 2014-08-05 MED ORDER — PNEUMOCOCCAL VAC POLYVALENT 25 MCG/0.5ML IJ INJ
0.5000 mL | INJECTION | INTRAMUSCULAR | Status: AC
  Administered 2014-08-06: 0.5 mL via INTRAMUSCULAR
  Filled 2014-08-05: qty 0.5

## 2014-08-05 MED ORDER — MUPIROCIN 2 % EX OINT
TOPICAL_OINTMENT | CUTANEOUS | Status: AC
  Filled 2014-08-05: qty 22

## 2014-08-05 MED ORDER — RAMIPRIL 5 MG PO CAPS: 7.5000 mg | ORAL_CAPSULE | Freq: Every day | ORAL | Status: DC

## 2014-08-05 MED ORDER — ONDANSETRON HCL 4 MG/2ML IJ SOLN: 4.0000 mg | Freq: Four times a day (QID) | INTRAMUSCULAR | Status: DC | PRN

## 2014-08-05 MED ORDER — ACETAMINOPHEN 325 MG PO TABS
325.0000 mg | ORAL_TABLET | ORAL | Status: DC | PRN
  Administered 2014-08-05: 650 mg via ORAL
  Filled 2014-08-05: qty 2

## 2014-08-05 MED ORDER — HEPARIN (PORCINE) IN NACL 2-0.9 UNIT/ML-% IJ SOLN
INTRAMUSCULAR | Status: AC
  Filled 2014-08-05: qty 500

## 2014-08-05 MED ORDER — MIDAZOLAM HCL 5 MG/5ML IJ SOLN
INTRAMUSCULAR | Status: AC
  Filled 2014-08-05: qty 5

## 2014-08-05 MED ORDER — CEFAZOLIN SODIUM-DEXTROSE 2-3 GM-% IV SOLR
INTRAVENOUS | Status: AC
  Filled 2014-08-05: qty 50

## 2014-08-05 MED ORDER — CARVEDILOL 25 MG PO TABS
25.0000 mg | ORAL_TABLET | Freq: Two times a day (BID) | ORAL | Status: DC
  Administered 2014-08-05 – 2014-08-06 (×2): 25 mg via ORAL
  Filled 2014-08-05 (×2): qty 1

## 2014-08-05 MED ORDER — LIDOCAINE HCL (PF) 1 % IJ SOLN
INTRAMUSCULAR | Status: AC
  Filled 2014-08-05: qty 60

## 2014-08-05 MED ORDER — MUPIROCIN 2 % EX OINT
1.0000 "application " | TOPICAL_OINTMENT | Freq: Once | CUTANEOUS | Status: AC
  Administered 2014-08-05: 1 via TOPICAL
  Filled 2014-08-05: qty 22

## 2014-08-05 MED ORDER — CHLORHEXIDINE GLUCONATE 4 % EX LIQD
60.0000 mL | Freq: Once | CUTANEOUS | Status: DC
  Filled 2014-08-05: qty 60

## 2014-08-05 MED ORDER — FENTANYL CITRATE (PF) 100 MCG/2ML IJ SOLN
INTRAMUSCULAR | Status: AC
  Filled 2014-08-05: qty 2

## 2014-08-05 MED ORDER — MIDAZOLAM HCL 5 MG/5ML IJ SOLN
INTRAMUSCULAR | Status: DC | PRN
  Administered 2014-08-05: 2 mg via INTRAVENOUS
  Administered 2014-08-05 (×3): 1 mg via INTRAVENOUS
  Administered 2014-08-05: 2 mg via INTRAVENOUS

## 2014-08-05 MED ORDER — CEFAZOLIN SODIUM 1 G IJ SOLR
2.0000 g | INTRAMUSCULAR | Status: DC | PRN
  Administered 2014-08-05: 2 g via INTRAVENOUS

## 2014-08-05 MED ORDER — FUROSEMIDE 40 MG PO TABS
40.0000 mg | ORAL_TABLET | Freq: Two times a day (BID) | ORAL | Status: DC
  Administered 2014-08-05 – 2014-08-06 (×2): 40 mg via ORAL
  Filled 2014-08-05 (×2): qty 1

## 2014-08-05 MED ORDER — SODIUM CHLORIDE 0.9 % IR SOLN
Status: AC
  Filled 2014-08-05: qty 2

## 2014-08-05 SURGICAL SUPPLY — 6 items
CABLE SURGICAL S-101-97-12 (CABLE) ×1 IMPLANT
ICD ELLIPSE VR CD1411-36C (ICD Generator) ×1 IMPLANT
LEAD ENDOTAK RELIANCE 0181 (Lead) ×1 IMPLANT
PAD DEFIB LIFELINK (PAD) ×1 IMPLANT
SHEATH COOK PEEL AWAY SET 9F (SHEATH) ×1 IMPLANT
TRAY PACEMAKER INSERTION (CUSTOM PROCEDURE TRAY) ×1 IMPLANT

## 2014-08-05 NOTE — Discharge Instructions (Signed)
° ° °  Supplemental Discharge Instructions for  °Pacemaker/Defibrillator Patients ° °Activity °No heavy lifting or vigorous activity with your left/right arm for 6 to 8 weeks.  Do not raise your left/right arm above your head for one week.  Gradually raise your affected arm as drawn below. ° °        ° °__       08/09/14             08/10/14                       08/11/14                 08/12/14 ° °NO DRIVING for 1 week    ; you may begin driving on  08/12/14   . ° °WOUND CARE °- Keep the wound area clean and dry.  Do not get this area wet for one week. No showers for one week; you may shower on   08/12/14  . °- The tape/steri-strips on your wound will fall off; do not pull them off.  No bandage is needed on the site.  DO  NOT apply any creams, oils, or ointments to the wound area. °- If you notice any drainage or discharge from the wound, any swelling or bruising at the site, or you develop a fever > 101? F after you are discharged home, call the office at once. ° °Special Instructions °- You are still able to use cellular telephones; use the ear opposite the side where you have your pacemaker/defibrillator.  Avoid carrying your cellular phone near your device. °- When traveling through airports, show security personnel your identification card to avoid being screened in the metal detectors.  Ask the security personnel to use the hand wand. °- Avoid arc welding equipment, MRI testing (magnetic resonance imaging), TENS units (transcutaneous nerve stimulators).  Call the office for questions about other devices. °- Avoid electrical appliances that are in poor condition or are not properly grounded. °- Microwave ovens are safe to be near or to operate. ° °Additional information for defibrillator patients should your device go off: °- If your device goes off ONCE and you feel fine afterward, notify the device clinic nurses. °- If your device goes off ONCE and you do not feel well afterward, call 911. °- If your device goes  off TWICE, call 911. °- If your device goes off THREE times in one day, call 911. ° °DO NOT DRIVE YOURSELF OR A FAMILY MEMBER °WITH A DEFIBRILLATOR TO THE HOSPITAL--CALL 911. ° °

## 2014-08-05 NOTE — H&P (Signed)
HPI [name] is referred by Dr. Mayford Knife for evaluation and consideration of ICD implant. He is a pleasant 59 yo man with atrial fibrillation, RA, s/p knee replacement and hip replacement, chronic systolic heart failure who has had progressively worsening symptoms over the past 6 months despite maximal medical therapy. He was cardioverted but maintained NSR only briefly. He did not know that he was in atrial fibrillation on presentation in the past. The patient has never had syncope. His EF is 25% by echo, previously 35%. He does not have peripheral edema. He is referred for additional evaluation.  Allergies  Allergen Reactions  . Sulfa Drugs Cross Reactors Hives and Other (See Comments)    thrush     Current Outpatient Prescriptions  Medication Sig Dispense Refill  . carvedilol (COREG) 25 MG tablet TAKE 1 TABLET TWICE A DAY 180 tablet 1  . furosemide (LASIX) 40 MG tablet TAKE 1 TABLET TWICE A DAY 180 tablet 1  . hydrocortisone cream 1 % Apply 1 application topically 3 (three) times daily as needed for itching (skin irritation). 30 g 0  . potassium chloride SA (K-DUR,KLOR-CON) 20 MEQ tablet TAKE 1 TABLET TWICE A DAY 180 tablet 1  . ramipril (ALTACE) 2.5 MG capsule TAKE 1 CAPSULE DAILY WITH BREAKFAST 90 capsule 1  . ramipril (ALTACE) 5 MG capsule Take one tablet daily with 2.5mg  tablet for a total of 7.5mg  daily 90 capsule 1  . rivaroxaban (XARELTO) 20 MG TABS tablet Take 1 tablet (20 mg total) by mouth daily with supper. 30 tablet 3   No current facility-administered medications for this visit.     Past Medical History  Diagnosis Date  . Complication of anesthesia     hx of irregular beat after anesthesia > 20 yrs ago   . Chronic kidney disease     hx of uti   . GERD (gastroesophageal reflux disease)   . H/O hiatal hernia   . Arthritis     rheumatoid  . Hepatitis     hx of as a child   .  Aortic insufficiency     mild   . Ejection fraction < 50%     by echo 08/25/11   . H/O hematuria   . 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 Hypertension   . Dysrhythmia 2013    atrial fib   . Chronic systolic CHF (congestive heart failure), NYHA class 1   . Aortic root dilatation     ROS:  All systems reviewed and negative except as noted in the HPI.   Past Surgical History  Procedure Laterality Date  . Deviatede septum,    . Urinary stricture     . 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 Colonoscopy      x2  . Total hip arthroplasty  09/08/2011    Procedure: TOTAL HIP ARTHROPLASTY ANTERIOR APPROACH; Surgeon: [name], MD; Location: [facility]; Service: Orthopedics; Laterality: Right; Right total hip replacement, left knee steroid injection  . Total knee arthroplasty Left 11/29/2012    Procedure: LEFT TOTAL KNEE ARTHROPLASTY; Surgeon: [name], MD; Location: [facility]; Service: Orthopedics; Laterality: Left; FEMORAL NERVE BLOCK IN HOLDING AREA LEFT LEG  . Cardiac catheterization      normal  . Cardioversion N/A 08/21/2013    Procedure: CARDIOVERSION; Surgeon: Quintella Reichert, MD; Location: [facility]; Service: Cardiovascular; Laterality: N/A;     Family History  Problem Relation Age of Onset  . Heart attack Father   . Heart disease Father   . Heart failure Brother      History   Social History  . Marital Status: Married    Spouse Name: N/A  . Number of Children: N/A  . Years of Education: N/A   Occupational History  . Not on file.   Social History Main Topics  . Smoking status: Never Smoker   . Smokeless tobacco: Never Used  . Alcohol Use: 0.6 oz/week    1 Cans of beer per week     Comment: 1 beer every 2 weeks  . Drug Use: No  .  Sexual Activity: Not on file   Other Topics Concern  . Not on file   Social History Narrative     BP 95/74 mmHg  Pulse 83  Ht 5\' 11"  (1.803 m)  Wt 213 lb (96.616 kg)  BMI 29.72 kg/m2  Physical Exam:  Well appearing NAD HEENT: Unremarkable Neck: No JVD, no thyromegally Lymphatics: No adenopathy Back: No CVA tenderness Lungs: Clear with no wheezes HEART: IRegular rate rhythm, no murmurs, no rubs, no clicks Abd: soft, positive bowel sounds, no organomegally, no rebound, no guarding Ext: 2 plus pulses, no edema, no cyanosis, no clubbing Skin: No rashes no nodules Neuro: CN II through XII intact, motor grossly intact  EKG - atrial fib with a controlled VR   Assess/Plan:            Chronic systolic heart failure - , MD at 07/01/2014 4:45 PM     Status: Written Related Problem: Chronic systolic heart failure   Expand All Collapse All   He has developed worsening LV dysfunction and has class 2 heart failure symptoms. He is on maximal medical therapy. I have recommended insertion of an ICD. Discussion of a SQ device was had as well. Will screen for a subcutaneous ICD.             Hypertension - 07/03/2014, MD at 07/01/2014 4:46 PM     Status: Written Related Problem: Hypertension   Expand All Collapse All   His blood pressure is controlled. He cannot have additional blood pressure lowering at this point. Will continue his current meds.            Atrial fibrillation - 07/03/2014, MD at 07/01/2014 4:47 PM     Status: Written Related Problem: Atrial fibrillation   Expand All Collapse All   He has chronic atrial fibrillation. His rate is controlled and he is tolerating his systemic anticoagulation. Will follow       Patient seen and examined. No change from prior clinic visit except that the patient's insurance was unwilling to pay for a SQ ICD. Will plan to proceed with VVI ICD.  07/03/2014.D.

## 2014-08-05 NOTE — Discharge Summary (Signed)
ELECTROPHYSIOLOGY PROCEDURE DISCHARGE SUMMARY    Patient ID: Terry [name],  MRN: [id number], DOB/AGE: 16-Nov-1955 59 y.o.  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  Persistent atrial fibrillation 2.  Hypertension 3.  Chronic systolic heart failure  Allergies  Allergen Reactions  . Sulfa Drugs Cross Reactors Hives and Other (See Comments)    thrush     Procedures This Admission:  1.  Implantation of a STJ single chamber ICD on 08/05/14 by Dr [name].  See op note for full details. DFT's were deferred at time of implant.  There were no immediate post procedure complications. 2.  CXR on 08/06/14 demonstrated no pneumothorax status post device implantation.   Brief HPI: Terry [name] is a 59 y.o. male was referred to electrophysiology in the outpatient setting for consideration of ICD implantation.  Past medical history includes non ischemic cardiomyopathy, persistent atrial fibrillation, and chronic systolic heart failure.  The patient has persistent LV dysfunction despite guideline directed therapy.  Risks, benefits, and alternatives to ICD implantation were reviewed with the patient who wished to proceed.   Hospital Course:  The patient was admitted and underwent implantation of a STJ single chamber ICD with details as outlined above. He was monitored on telemetry overnight which demonstrated atrial fibrillation.  Left chest was without hematoma or ecchymosis.  The device was interrogated and found to be functioning normally.  CXR was obtained and demonstrated no pneumothorax status post device implantation.  Wound care, arm mobility, and restrictions were reviewed with the patient.  The patient was examined and considered stable for  discharge to home.   The patient's discharge medications include an ACE-I (Ramipril) and beta blocker (Coreg).   This patients CHA2DS2-VASc Score and unadjusted Ischemic Stroke Rate (% per year) is equal to 2.2 % stroke rate/year from a score of 2 Above score calculated as 1 point each if present [CHF, HTN, DM, Vascular=MI/PAD/Aortic Plaque, Age if 65-74, or Male] Above score calculated as 2 points each if present [Age > 75, or Stroke/TIA/TE]   Physical Exam: Filed Vitals:   08/05/14 1527 08/05/14 1547 08/05/14 2042 08/06/14 0500  BP:  108/84 100/60 115/79  Pulse: 0  77 90  Temp:   97.7 F (36.5 C) 98.2 F (36.8 C)  TempSrc:  Oral Oral Oral  Resp: 0  18 18  Height:      Weight:    210 lb 8.6 oz (95.5 kg)  SpO2: 0%  97% 98%    GEN- The patient is well appearing, alert and oriented x 3 today.   HEENT: normocephalic, atraumatic; sclera clear, conjunctiva pink; hearing intact; oropharynx clear; neck supple, no JVP Lymph- no cervical lymphadenopathy Lungs- Clear to ausculation bilaterally, normal work of breathing.  No wheezes, rales, rhonchi Heart- Irregular rate and rhythm, no murmurs, rubs or gallops  GI- soft, non-tender, non-distended, bowel sounds present Extremities- no clubbing, cyanosis, or edema; DP/PT/radial pulses 2+ bilaterally MS- no significant deformity or atrophy Skin- warm and dry, no rash or lesion, left chest without hematoma/ecchymosis Psych- euthymic mood, full affect Neuro- strength and sensation are intact   Labs:   Lab Results  Component Value Date   WBC 7.1 08/05/2014   HGB 12.7* 08/05/2014   HCT 37.5* 08/05/2014   MCV 87.2 08/05/2014   PLT 222 08/05/2014  Recent Labs Lab 08/05/14 1113  NA 140  K 4.2  CL 104  CO2 27  BUN 13  CREATININE 1.08  CALCIUM 9.1  GLUCOSE 95    Discharge Medications:    Medication List    TAKE these medications        carvedilol 25 MG tablet  Commonly known as:  COREG  TAKE 1 TABLET TWICE A DAY       furosemide 40 MG tablet  Commonly known as:  LASIX  TAKE 1 TABLET TWICE A DAY     hydrocortisone cream 1 %  Apply 1 application topically 3 (three) times daily as needed for itching (skin irritation).     potassium chloride SA 20 MEQ tablet  Commonly known as:  K-DUR,KLOR-CON  TAKE 1 TABLET TWICE A DAY     ramipril 2.5 MG capsule  Commonly known as:  ALTACE  TAKE 1 CAPSULE DAILY WITH BREAKFAST     ramipril 5 MG capsule  Commonly known as:  ALTACE  Take one tablet daily with 2.5mg  tablet for a total of 7.5mg  daily     rivaroxaban 20 MG Tabs tablet  Commonly known as:  XARELTO  Take 1 tablet (20 mg total) by mouth daily with supper. Resume on 08/07/14        Disposition:  Discharge Instructions    Diet - low sodium heart healthy    Complete by:  As directed      Increase activity slowly    Complete by:  As directed           Follow-up Information    Follow up with [facility] On 08/20/2014.   Why:  at 74 Noon for wound check   Contact information:   [address] [place] [address]       Duration of Discharge Encounter: Greater than 30 minutes including physician time.  Signed, Gypsy Balsam, NP 08/06/2014 8:04 AM   EP Attending  Patient seen and examined. Agree with above. Ok for discharge with usual followup. Will hold Xarelto until 6/24.  Leonia Reeves.D.

## 2014-08-06 ENCOUNTER — Ambulatory Visit (HOSPITAL_COMMUNITY): Payer: 59

## 2014-08-06 ENCOUNTER — Encounter (HOSPITAL_COMMUNITY): Payer: Self-pay | Admitting: Internal Medicine

## 2014-08-06 DIAGNOSIS — I5022 Chronic systolic (congestive) heart failure: Secondary | ICD-10-CM | POA: Diagnosis not present

## 2014-08-06 DIAGNOSIS — I4891 Unspecified atrial fibrillation: Secondary | ICD-10-CM | POA: Diagnosis not present

## 2014-08-06 DIAGNOSIS — I429 Cardiomyopathy, unspecified: Secondary | ICD-10-CM | POA: Diagnosis not present

## 2014-08-06 DIAGNOSIS — Z95810 Presence of automatic (implantable) cardiac defibrillator: Secondary | ICD-10-CM | POA: Diagnosis not present

## 2014-08-06 DIAGNOSIS — I10 Essential (primary) hypertension: Secondary | ICD-10-CM | POA: Diagnosis not present

## 2014-08-06 MED ORDER — RIVAROXABAN 20 MG PO TABS: 20.0000 mg | ORAL_TABLET | Freq: Every day | ORAL | Status: DC

## 2014-08-06 MED FILL — Heparin Sodium (Porcine) 2 Unit/ML in Sodium Chloride 0.9%: INTRAMUSCULAR | Qty: 500 | Status: AC

## 2014-08-06 MED FILL — Cefazolin Sodium for IV Soln 2 GM and Dextrose 3% (50 ML): INTRAVENOUS | Qty: 50 | Status: AC

## 2014-08-06 NOTE — Progress Notes (Signed)
Patient ID: Terry [name], male   DOB: [birth year], 59 y.o.   MRN: [id number] ICD Criteria  Current LVEF:35% ;Obtained > 3 months ago and < or = 6 months ago.   NYHA Functional Classification: Class II  Heart Failure History:  Yes, Duration of heart failure since onset is 3 to 9 months  Non-Ischemic Dilated Cardiomyopathy History:  No.  Atrial Fibrillation/Atrial Flutter:  Yes, A-Fib/A-Flutter type: Permanent (>1 year).  Ventricular Tachycardia History:  No.  Cardiac Arrest History:  No  History of Syndromes with Risk of Sudden Death:  No.  Previous ICD:  No.  Electrophysiology Study: No.  Prior MI: No.  PPM: No.  OSA:  Yes  Patient Life Expectancy of >=1 year: Yes.  Anticoagulation Therapy:  Patient is on anticoagulation therapy, anticoagulation was held prior to procedure.   Beta Blocker Therapy:  Yes.   Ace Inhibitor/ARB Therapy:  Yes.

## 2014-08-07 ENCOUNTER — Encounter (HOSPITAL_COMMUNITY): Payer: Self-pay

## 2014-08-07 ENCOUNTER — Encounter: Payer: Self-pay | Admitting: Cardiology

## 2014-08-07 ENCOUNTER — Ambulatory Visit (HOSPITAL_COMMUNITY)
Admission: RE | Admit: 2014-08-07 | Discharge: 2014-08-07 | Disposition: A | Discharge status: Home / Self Care | Payer: 59 | Source: Ambulatory Visit | Attending: Cardiology | Admitting: Cardiology

## 2014-08-07 VITALS — BP 100/62 | HR 88 | Wt 211.0 lb

## 2014-08-07 DIAGNOSIS — M069 Rheumatoid arthritis, unspecified: Secondary | ICD-10-CM | POA: Insufficient documentation

## 2014-08-07 DIAGNOSIS — I481 Persistent atrial fibrillation: Secondary | ICD-10-CM | POA: Insufficient documentation

## 2014-08-07 DIAGNOSIS — Z95810 Presence of automatic (implantable) cardiac defibrillator: Secondary | ICD-10-CM | POA: Diagnosis not present

## 2014-08-07 DIAGNOSIS — I429 Cardiomyopathy, unspecified: Secondary | ICD-10-CM | POA: Diagnosis not present

## 2014-08-07 DIAGNOSIS — Z79899 Other long term (current) drug therapy: Secondary | ICD-10-CM | POA: Diagnosis not present

## 2014-08-07 DIAGNOSIS — Z7901 Long term (current) use of anticoagulants: Secondary | ICD-10-CM | POA: Diagnosis not present

## 2014-08-07 DIAGNOSIS — I420 Dilated cardiomyopathy: Secondary | ICD-10-CM

## 2014-08-07 DIAGNOSIS — I5022 Chronic systolic (congestive) heart failure: Secondary | ICD-10-CM

## 2014-08-07 DIAGNOSIS — I480 Paroxysmal atrial fibrillation: Secondary | ICD-10-CM | POA: Diagnosis not present

## 2014-08-07 DIAGNOSIS — I10 Essential (primary) hypertension: Secondary | ICD-10-CM | POA: Insufficient documentation

## 2014-08-07 MED ORDER — SACUBITRIL-VALSARTAN 24-26 MG PO TABS: 1.0000 | ORAL_TABLET | Freq: Two times a day (BID) | ORAL | Status: DC

## 2014-08-07 MED ORDER — DOFETILIDE 500 MCG PO CAPS: 500.0000 ug | ORAL_CAPSULE | Freq: Two times a day (BID) | ORAL | Status: DC

## 2014-08-07 NOTE — Progress Notes (Signed)
Patient ID: Terry [name], male   DOB: 1955/10/20, 59 y.o.   MRN: [id number]   Primary Care: Terry Blamer, MD Primary Cardiologist: Armanda Magic, MD  HPI:  Terry [name] is a 59 y.o. 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 He presents as a new patient to the clinic today. He has had notable LV EF decline in the past six months despite guideline directed therapy by Dr. Armanda Magic and has been referred to [place] for evaluation.  Back in 2006, an echo showed EF 15-20%.  LHC at that time showed normal coronaries.  He has been managed for cardiomyopathy since that time. In 12/14, EF had improved to 50%.  However, he has had a decline since then.  Last echo in 5/16 showed EF 25%.  Atrial fibrillation was first noted in 2013.  In 7/15, he had DCCV to NSR.  In 1/16, he was noted to be back in atrial fibrillation and appears to have been in atrial fibrillation since that time. He had St Jude ICD placed by Dr [name] in 6/16.    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  He says he watches fluid intake, eats a lot of vegetables. Says he may get some SOB when he mows his entire yard, but can walk as long as he wants on flat ground. Can do a few flights of stairs before any SOB. Has some bendopnea, but only when bent over for prolonged periods. Has very rare palpitations. Denies peripheral edema, orthopnea, PND, lightheadedness, syncope/near syncope, and CP. Says he has a chronic cough that is sometimes productive of a white foam but not often. His energy level is a lot lower when he is in afib. Does not take daily weights.  Labs (5/16): HIV negative, ferritin normal, immunofixation with no monoclonal protein.  Labs (08/05/14): K 4.2, Creatinine 1.08  ECG: Atrial fibrillation with rate 95, narrow QRS, nonspecific T wave changes, QTc 495 msec  PMH 1. Nonischemic dilated cardiomyopathy: Echo (2006) with  EF 15-20%.  LHC in 10/06 showed normal coronaries.  By 2014, EF had improved to 50%.  Echo in 1/16 showed EF 35-40%.  Echo (5/16) showed EF 25% with mild MR.  St Jude ICD 6/16.  HIV negative, immunofixation with no monoclonal protein, and ferritin normal.  2. Atrial fibrillation: Paroxysmal.  First noted in 2013.  DCCV in 7/15.  Back in atrial fibrillation 1/16 and has remained in atrial fibrillation since that time.  3. HTN 4. Rheumatoid Arthritis: Has been off all medications for several years.   5. H/o TKR, h/o THR  SH: Lives at home with wife and 2 children and a grandchild. Never smoker, drinks 1 or 2 beers a week with dinner. 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   FH: Father with MI at 56, brother with atrial fibrillation, mother with renal failure.   ROS: All systems reviewed and negative except as per HPI.   Current Outpatient Prescriptions  Medication Sig Dispense Refill  . carvedilol (COREG) 25 MG tablet TAKE 1 TABLET TWICE A DAY 180 tablet 1  . furosemide (LASIX) 40 MG tablet TAKE 1 TABLET TWICE A DAY 180 tablet 1  . hydrocortisone cream 1 % Apply 1 application topically 3 (three) times daily as needed for itching (skin irritation). 30 g 0  . potassium chloride SA (K-DUR,KLOR-CON) 20 MEQ tablet TAKE 1 TABLET TWICE A DAY 180  tablet 1  . ramipril (ALTACE) 2.5 MG capsule TAKE 1 CAPSULE DAILY WITH BREAKFAST 90 capsule 1  . ramipril (ALTACE) 5 MG capsule Take one tablet daily with 2.5mg  tablet for a total of 7.5mg  daily 90 capsule 1  . rivaroxaban (XARELTO) 20 MG TABS tablet Take 1 tablet (20 mg total) by mouth daily with supper. Resume on 08/07/14 30 tablet 3   No current facility-administered medications for this encounter.    Allergies  Allergen Reactions  . Sulfa Drugs Cross Reactors Hives and Other (See Comments)    thrush   BP 100/62 mmHg  Pulse 88  Wt 211 lb (95.709 kg)  SpO2 99% PHYSICAL EXAM: General:  Well appearing. No respiratory difficulty HEENT:  normal Neck: supple. no JVD. Carotids 2+ bilat; no bruits. No lymphadenopathy or thryomegaly appreciated. Cor: PMI nondisplaced. Regular rate & rhythm. No rubs, gallops or murmurs appreciated Lungs: clear Abdomen: soft, nontender, nondistended. No hepatosplenomegaly. No bruits or masses. Good bowel sounds. Extremities: no cyanosis, clubbing, rash, edema Neuro: alert & oriented x 3, cranial nerves grossly intact. moves all 4 extremities w/o difficulty. Affect pleasant.   ASSESSMENT & PLAN:  1. Chronic systolic HF: Suspected nonischemic cardiomyopathy.  EF has been low since 2006.  LHC at that time showed no significant CAD.  EF had improved up to 50% in 2014.  LV EF has worsened more recently, perhaps corresponding to his being in persistent atrial fibrillation.  Most recent echo in 5/16 showed EF 25%.  He has a Secondary school teacher ICD and is not a candidate for CRT due to narrow QRS. NYHA class II symptoms with no volume overload on exam.   - Continue Lasix 40 mg bid, he appears euvolemic.  Recent BMET looked ok.  - Continue current Coreg.  - Stop ramipril and start Entresto 24/26 mg bid on Sunday evening after 36 hr ACEI washout.  BMET in 10 days.   - Eventually would re-try spironolactone.  - I suspect that his cardiomyopathy is nonischemic as EF has been down long-term and angiography in 2006 showed no significant disease.  However, given recent fall, I will arrange for a Lexiscan Cardiolite.   - Given fall in EF corresponding to atrial fibrillation, think it would be reasonable to get him out of atrial fibrillation (see below).  - Encouraged daily weights, fluid and salt restriction. 2.  Persistent atrial fibrillation: Since at least 1/16.  He was cardioverted in 7/15.  He feels less energetic in atrial fibrillation. Fall in EF also corresponds to going back into atrial fibrillation.  I do not think that he would hold NSR without anti-arrhythmic.  Options would be Tikosyn or amiodarone.  His insurance may  not cover Tikosyn, we will look into that today.  His QT interval is also prolonged so may not be candidate (will need to repeat ECG if plan to use and discuss with EP).  If we do not admit him for Tikosyn loading, will plan to start amiodarone as outpatient with the usual monitoring. After anti-arrhythmic started, will attempt DCCV.  Continue Xarelto 20 mg daily.   Followup in 3 wks.   Marca Ancona 08/09/2014

## 2014-08-07 NOTE — Patient Instructions (Signed)
Stop Rakmipril  Start Entresto 24/26 mg Twice daily   Labs in about 10 days  Your physician has requested that you have a lexiscan myoview. For further information please visit [email or web address] Please follow instruction sheet, as given.  We are checking on the cost of a new medication called Tikosyn, we will contact you once we know a price  Your physician recommends that you schedule a follow-up appointment in: 3 weeks

## 2014-08-09 ENCOUNTER — Encounter: Payer: Self-pay | Admitting: Cardiology

## 2014-08-10 ENCOUNTER — Encounter: Payer: Self-pay | Admitting: Internal Medicine

## 2014-08-11 ENCOUNTER — Telehealth: Payer: Self-pay

## 2014-08-11 NOTE — Telephone Encounter (Signed)
Prior auth for Terry [name] 24-26 sent to [facility] via Cover My Meds.

## 2014-08-18 ENCOUNTER — Other Ambulatory Visit (INDEPENDENT_AMBULATORY_CARE_PROVIDER_SITE_OTHER): Payer: Commercial Managed Care - HMO | Admitting: *Deleted

## 2014-08-18 DIAGNOSIS — I5022 Chronic systolic (congestive) heart failure: Secondary | ICD-10-CM | POA: Diagnosis not present

## 2014-08-18 LAB — BASIC METABOLIC PANEL
BUN: 12 mg/dL (ref 6–23)
CALCIUM: 9.3 mg/dL (ref 8.4–10.5)
CO2: 28 mEq/L (ref 19–32)
Chloride: 102 mEq/L (ref 96–112)
Creatinine, Ser: 1.06 mg/dL (ref 0.40–1.50)
GFR: 75.99 mL/min (ref >60.00)
Glucose, Bld: 78 mg/dL (ref 70–99)
Potassium: 4.2 mEq/L (ref 3.5–5.1)
Sodium: 137 mEq/L (ref 135–145)

## 2014-08-18 NOTE — Addendum Note (Signed)
Addended by: Tonita Phoenix on: 08/18/2014 11:24 AM   Modules accepted: Orders

## 2014-08-19 ENCOUNTER — Telehealth (HOSPITAL_COMMUNITY): Payer: Self-pay | Admitting: *Deleted

## 2014-08-19 NOTE — Telephone Encounter (Signed)
Left message on voicemail in reference to upcoming appointment scheduled for 08/24/14. Phone number given for a call back so details instructions can be given. Thermon Zulauf J Kortnee Bas, RN 

## 2014-08-20 ENCOUNTER — Ambulatory Visit (INDEPENDENT_AMBULATORY_CARE_PROVIDER_SITE_OTHER): Payer: Commercial Managed Care - HMO | Admitting: *Deleted

## 2014-08-20 ENCOUNTER — Telehealth (HOSPITAL_COMMUNITY): Payer: Self-pay | Admitting: *Deleted

## 2014-08-20 DIAGNOSIS — I420 Dilated cardiomyopathy: Secondary | ICD-10-CM

## 2014-08-20 DIAGNOSIS — I429 Cardiomyopathy, unspecified: Secondary | ICD-10-CM | POA: Diagnosis not present

## 2014-08-20 DIAGNOSIS — I5022 Chronic systolic (congestive) heart failure: Secondary | ICD-10-CM

## 2014-08-20 LAB — CUP PACEART INCLINIC DEVICE CHECK
HIGH POWER IMPEDANCE MEASURED VALUE: 65.25 Ohm
Lead Channel Impedance Value: 487.5 Ohm
Lead Channel Pacing Threshold Amplitude: 0.5 V
Lead Channel Pacing Threshold Pulse Width: 0.5 ms
Lead Channel Sensing Intrinsic Amplitude: 11.5 mV
Lead Channel Setting Sensing Sensitivity: 0.5 mV
MDC IDC MSMT BATTERY REMAINING LONGEVITY: 97.2 mo
MDC IDC PG SERIAL: 7280026
MDC IDC SESS DTM: 20160707131847
MDC IDC SET LEADCHNL RV PACING AMPLITUDE: 3.5 V
MDC IDC SET LEADCHNL RV PACING PULSEWIDTH: 0.5 ms
MDC IDC SET ZONE DETECTION INTERVAL: 300 ms
MDC IDC STAT BRADY RV PERCENT PACED: 0.56 %
Zone Setting Detection Interval: 330 ms

## 2014-08-20 NOTE — Telephone Encounter (Signed)
Patient given detailed instructions per Myocardial Perfusion Study Information Sheet for test on 08/24/14 at 12. Patient Notified to arrive 15 minutes early, and that it is imperative to arrive on time for appointment to keep from having the test rescheduled. Patient verbalized understanding. Antionette Char, RN

## 2014-08-20 NOTE — Progress Notes (Signed)
Wound check appointment. Steri-strips removed. Wound without redness or edema. Incision edges approximated, wound well healed. Normal device function. Threshold, sensing, and impedances consistent with implant measurements. Device programmed at 3.5V for extra safety margin until 3 month visit. Histogram distribution appropriate for patient and level of activity. No mode switches. 1 NSVT episode- 4 seconds, pk V 189. Patient educated about wound care, arm mobility, lifting restrictions, shock plan. ROV with GT 11-03-14 at 11:15.

## 2014-08-24 ENCOUNTER — Telehealth: Payer: Self-pay

## 2014-08-24 ENCOUNTER — Ambulatory Visit (HOSPITAL_COMMUNITY): Payer: Commercial Managed Care - HMO | Attending: Internal Medicine

## 2014-08-24 DIAGNOSIS — Z8249 Family history of ischemic heart disease and other diseases of the circulatory system: Secondary | ICD-10-CM | POA: Insufficient documentation

## 2014-08-24 DIAGNOSIS — R9439 Abnormal result of other cardiovascular function study: Secondary | ICD-10-CM | POA: Insufficient documentation

## 2014-08-24 DIAGNOSIS — I429 Cardiomyopathy, unspecified: Secondary | ICD-10-CM | POA: Diagnosis not present

## 2014-08-24 DIAGNOSIS — I5022 Chronic systolic (congestive) heart failure: Secondary | ICD-10-CM

## 2014-08-24 DIAGNOSIS — I10 Essential (primary) hypertension: Secondary | ICD-10-CM | POA: Insufficient documentation

## 2014-08-24 DIAGNOSIS — I4891 Unspecified atrial fibrillation: Secondary | ICD-10-CM | POA: Insufficient documentation

## 2014-08-24 DIAGNOSIS — Z95810 Presence of automatic (implantable) cardiac defibrillator: Secondary | ICD-10-CM | POA: Insufficient documentation

## 2014-08-24 DIAGNOSIS — R5383 Other fatigue: Secondary | ICD-10-CM | POA: Diagnosis not present

## 2014-08-24 LAB — MYOCARDIAL PERFUSION IMAGING
CHL CUP NUCLEAR SDS: 8
LV dias vol: 140 mL
LV sys vol: 85 mL
Peak HR: 120 {beats}/min
RATE: 0.35
Rest HR: 93 {beats}/min
SRS: 11
SSS: 19
TID: 0.86

## 2014-08-24 MED ORDER — REGADENOSON 0.4 MG/5ML IV SOLN
0.4000 mg | Freq: Once | INTRAVENOUS | Status: AC
  Administered 2014-08-24: 0.4 mg via INTRAVENOUS

## 2014-08-24 MED ORDER — TECHNETIUM TC 99M SESTAMIBI GENERIC - CARDIOLITE
30.5000 | Freq: Once | INTRAVENOUS | Status: AC | PRN
  Administered 2014-08-24: 31 via INTRAVENOUS

## 2014-08-24 MED ORDER — TECHNETIUM TC 99M SESTAMIBI GENERIC - CARDIOLITE
11.0000 | Freq: Once | INTRAVENOUS | Status: AC | PRN
  Administered 2014-08-24: 11 via INTRAVENOUS

## 2014-08-24 NOTE — Telephone Encounter (Signed)
Entresto approved  For one year, through 07/27/2015. Auth # [id number]. Pharmacy notified.

## 2014-08-28 ENCOUNTER — Ambulatory Visit (HOSPITAL_COMMUNITY)
Admission: RE | Admit: 2014-08-28 | Discharge: 2014-08-28 | Disposition: A | Discharge status: Home / Self Care | Payer: Commercial Managed Care - HMO | Source: Ambulatory Visit | Attending: Cardiology | Admitting: Cardiology

## 2014-08-28 ENCOUNTER — Other Ambulatory Visit: Payer: Self-pay | Admitting: *Deleted

## 2014-08-28 ENCOUNTER — Encounter (HOSPITAL_COMMUNITY): Payer: Self-pay | Admitting: *Deleted

## 2014-08-28 VITALS — BP 112/70 | HR 107 | Wt 215.2 lb

## 2014-08-28 DIAGNOSIS — Z882 Allergy status to sulfonamides status: Secondary | ICD-10-CM | POA: Diagnosis not present

## 2014-08-28 DIAGNOSIS — I4819 Other persistent atrial fibrillation: Secondary | ICD-10-CM

## 2014-08-28 DIAGNOSIS — M069 Rheumatoid arthritis, unspecified: Secondary | ICD-10-CM | POA: Diagnosis not present

## 2014-08-28 DIAGNOSIS — I5022 Chronic systolic (congestive) heart failure: Secondary | ICD-10-CM | POA: Diagnosis present

## 2014-08-28 DIAGNOSIS — I429 Cardiomyopathy, unspecified: Secondary | ICD-10-CM | POA: Diagnosis not present

## 2014-08-28 DIAGNOSIS — I10 Essential (primary) hypertension: Secondary | ICD-10-CM | POA: Insufficient documentation

## 2014-08-28 DIAGNOSIS — Z79899 Other long term (current) drug therapy: Secondary | ICD-10-CM | POA: Insufficient documentation

## 2014-08-28 DIAGNOSIS — Z8249 Family history of ischemic heart disease and other diseases of the circulatory system: Secondary | ICD-10-CM | POA: Diagnosis not present

## 2014-08-28 DIAGNOSIS — Z7902 Long term (current) use of antithrombotics/antiplatelets: Secondary | ICD-10-CM | POA: Diagnosis not present

## 2014-08-28 DIAGNOSIS — I481 Persistent atrial fibrillation: Secondary | ICD-10-CM | POA: Diagnosis not present

## 2014-08-28 DIAGNOSIS — Z841 Family history of disorders of kidney and ureter: Secondary | ICD-10-CM | POA: Diagnosis not present

## 2014-08-28 DIAGNOSIS — Z95810 Presence of automatic (implantable) cardiac defibrillator: Secondary | ICD-10-CM | POA: Diagnosis not present

## 2014-08-28 MED ORDER — EPLERENONE 25 MG PO TABS: 25.0000 mg | ORAL_TABLET | Freq: Every day | ORAL | Status: DC

## 2014-08-28 NOTE — Patient Instructions (Signed)
Start Inspra 25 mg daily  Labs in 2 weeks  Your physician has requested that you have a cardiac catheterization. Cardiac catheterization is used to diagnose and/or treat various heart conditions. Doctors may recommend this procedure for a number of different reasons. The most common reason is to evaluate chest pain. Chest pain can be a symptom of coronary artery disease (CAD), and cardiac catheterization can show whether plaque is narrowing or blocking your heart's arteries. This procedure is also used to evaluate the valves, as well as measure the blood flow and oxygen levels in different parts of your heart. For further information please visit [email or web address] Please follow instruction sheet, as given.  SCHEDULED FOR 7/25, SEE INSTRUCTION SHEET  You have been referred to [facility]  Your physician recommends that you schedule a follow-up appointment in: 1 month

## 2014-08-28 NOTE — Progress Notes (Signed)
Patient ID: Terry [name], male   DOB: 1955/04/17, 59 y.o.   MRN: [id number] Primary Care: [name], MD Primary Cardiologist: Armanda Magic, MD  HPI: Terry [name] is a 59 y.o. 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 He presents as a new patient to the clinic today. He has had notable LV EF decline in the past six months despite guideline directed therapy by Dr. Armanda Magic and has been referred to [place] for evaluation.  Back in 2006, an echo showed EF 15-20%.  LHC at that time showed normal coronaries.  He has been managed for cardiomyopathy since that time. In 12/14, EF had improved to 50%.  However, he has had a decline since then.  Last echo in 5/16 showed EF 25%.  Atrial fibrillation was first noted in 2013.  In 7/15, he had DCCV to NSR.  In 1/16, he was noted to be back in atrial fibrillation and appears to have been in atrial fibrillation since that time. He had St Jude ICD placed by Dr [name] in 6/16.    He returns today for HF follow up. At his previous appointment, we switched from ACEi to [facility].  Given fall in EF, Lexiscan Cardiolite was done. This showed EF 39% with partially reversible inferior and apical perfusion defect. Still can do whatever he wants without SOB on flat ground and several flights of stairs. Taking all medicines as directed, has not missed any. Still low energy with afib. Has only occasional palpitations. Denies peripheral edema, orthopnea, PND, lightheadedness, syncope/near syncope, or CP.    Labs (5/16): HIV negative, ferritin normal, immunofixation with no monoclonal protein.  Labs (6/16): K 4.2, Creatinine 1.08, HCT 37.5 Labs (08/18/14): K 4.2, Creatinine 1.06  ECG: Atrial fibrillation with rate 95, narrow QRS, nonspecific T wave changes, QTc 495 msec  PMH 1. Nonischemic dilated cardiomyopathy: Echo (2006) with EF 15-20%.  LHC in 10/06 showed normal coronaries.  By 2014, EF had  improved to 50%.  Echo in 1/16 showed EF 35-40%.  Echo (5/16) showed EF 25% with mild MR.  St Jude ICD 6/16.  HIV negative, immunofixation with no monoclonal protein, and ferritin normal.  Lexiscan Cardiolite (7/16) with EF 39% and partially reversible inferior and apical perfusion defect (intermediate risk).   2. Atrial fibrillation: Paroxysmal.  First noted in 2013.  DCCV in 7/15.  Back in atrial fibrillation 1/16 and has remained in atrial fibrillation since that time.  3. HTN 4. Rheumatoid Arthritis: Has been off all medications for several years.   5. H/o TKR, h/o THR 6. Gynecomastia with spironolactone.   SH: Lives at home with wife and 2 children and a grandchild. Never smoker, drinks 1 or 2 beers a week with dinner. 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   FH: Father with MI at 48, brother with atrial fibrillation, mother with renal failure.   ROS: All systems reviewed and negative except as per HPI.   Current Outpatient Prescriptions  Medication Sig Dispense Refill  . carvedilol (COREG) 25 MG tablet TAKE 1 TABLET TWICE A DAY 180 tablet 1  . dofetilide (TIKOSYN) 500 MCG capsule Take 1 capsule (500 mcg total) by mouth 2 (two) times daily. (Patient not taking: Reported on 08/20/2014) 60 capsule 3  . furosemide (LASIX) 40 MG tablet TAKE 1 TABLET TWICE A DAY 180 tablet 1  . hydrocortisone cream 1 % Apply 1 application topically 3 (three) times daily as  needed for itching (skin irritation). (Patient not taking: Reported on 08/20/2014) 30 g 0  . potassium chloride SA (K-DUR,KLOR-CON) 20 MEQ tablet TAKE 1 TABLET TWICE A DAY 180 tablet 1  . rivaroxaban (XARELTO) 20 MG TABS tablet Take 1 tablet (20 mg total) by mouth daily with supper. Resume on 08/07/14 30 tablet 3  . sacubitril-valsartan (ENTRESTO) 24-26 MG Take 1 tablet by mouth 2 (two) times daily. 60 tablet 3   No current facility-administered medications for this encounter.    Allergies  Allergen Reactions  . Sulfa Drugs Cross Reactors  Hives and Other (See Comments)    thrush   There were no vitals taken for this visit. PHYSICAL EXAM: General:  Well appearing. No respiratory difficulty HEENT: normal Neck: supple. no JVD. Carotids 2+ bilat; no bruits. No lymphadenopathy or thryomegaly appreciated. Cor: PMI nondisplaced. Regular rate & rhythm. No rubs, gallops or murmurs appreciated Lungs: clear Abdomen: soft, nontender, nondistended. No hepatosplenomegaly. No bruits or masses. Good bowel sounds. Extremities: no cyanosis, clubbing, rash, edema Neuro: alert & oriented x 3, cranial nerves grossly intact. moves all 4 extremities w/o difficulty. Affect pleasant.   ASSESSMENT & PLAN:  1. Chronic systolic HF: Suspected nonischemic cardiomyopathy x years. NYHA class II.  He has a Secondary school teacher ICD and is not a candidate for CRT due to narrow QRS. Given fall in EF, we had set him up for a Cardiolite in 7/16.  This was intermediate risk with possible inferior and apical ischemia.  He denies chest pain.  - Continue Lasix 40 mg bid - Continue current Coreg.  - Continue Entresto 24/26 mg bid  - Gynecomastia with spironolactone, will try him on eplerenone 25 mg daily.   - With fall in EF and intermediate risk Cardiolite, I will arrange for coronary angiography.  I discussed cath with patient and wife with description of risks and benefits.  They agree to proceed.  - Given fall in EF corresponding to atrial fibrillation, think it would be reasonable to get him out of atrial fibrillation (see below).  - Encouraged daily weights, fluid and salt restriction. 2.  Persistent atrial fibrillation: Since at least 1/16.  He was cardioverted in 7/15.  He feels less energetic in atrial fibrillation. Fall in EF also corresponds to going back into atrial fibrillation.  Do not think that he would hold NSR without anti-arrhythmic.   - He will have to hold Xarelto for cath.  After cath, restart Xarelto.  After 3-4 weeks, I will plan to admit him for  dofetilide initiation to see if we can get him back in NSR.  I will refer him to pharmacy clinic for dofetilide initiation.  - Continue Xarelto 20 mg daily, hold for 3 days for cath.   Follow-up in 1 month.   Mariam Dollar Tillery PA-C 08/28/2014   Patient seen with PA, agree with the above note.  Intermediate risk stress test, coupled with fall in EF, will plan coronary angiography.  After ischemic evaluation, would plan to try to get him out of fibrillation.  After restarting Xarelto, he will go 3-4 weeks then will plan to admit for Tikosyn initiation.  Refer to pharmacy clinic.   Also will start eplerenone with BMET in 2 wks.   Marca Ancona 08/29/2014

## 2014-09-02 ENCOUNTER — Ambulatory Visit: Payer: Commercial Managed Care - HMO | Admitting: Pharmacist

## 2014-09-04 ENCOUNTER — Encounter (HOSPITAL_COMMUNITY): Payer: Self-pay | Admitting: Cardiology

## 2014-09-04 NOTE — Progress Notes (Signed)
Pt scheduled for LHC on 09/07/14 with Dr. [name] Cpt code- 06269 With pts current insurance- [employer] No pre cert required per web portal

## 2014-09-07 ENCOUNTER — Encounter (HOSPITAL_COMMUNITY)
Admission: RE | Disposition: A | Discharge status: Home / Self Care | Payer: Self-pay | Source: Ambulatory Visit | Attending: Cardiology

## 2014-09-07 ENCOUNTER — Ambulatory Visit (HOSPITAL_COMMUNITY)
Admission: RE | Admit: 2014-09-07 | Discharge: 2014-09-07 | Disposition: A | Discharge status: Home / Self Care | Payer: Commercial Managed Care - HMO | Source: Ambulatory Visit | Attending: Cardiology | Admitting: Cardiology

## 2014-09-07 DIAGNOSIS — Z8249 Family history of ischemic heart disease and other diseases of the circulatory system: Secondary | ICD-10-CM | POA: Insufficient documentation

## 2014-09-07 DIAGNOSIS — I5022 Chronic systolic (congestive) heart failure: Secondary | ICD-10-CM | POA: Diagnosis not present

## 2014-09-07 DIAGNOSIS — Z7901 Long term (current) use of anticoagulants: Secondary | ICD-10-CM | POA: Insufficient documentation

## 2014-09-07 DIAGNOSIS — N62 Hypertrophy of breast: Secondary | ICD-10-CM | POA: Diagnosis not present

## 2014-09-07 DIAGNOSIS — I429 Cardiomyopathy, unspecified: Secondary | ICD-10-CM | POA: Insufficient documentation

## 2014-09-07 DIAGNOSIS — I10 Essential (primary) hypertension: Secondary | ICD-10-CM | POA: Insufficient documentation

## 2014-09-07 DIAGNOSIS — Z95810 Presence of automatic (implantable) cardiac defibrillator: Secondary | ICD-10-CM | POA: Insufficient documentation

## 2014-09-07 DIAGNOSIS — I481 Persistent atrial fibrillation: Secondary | ICD-10-CM | POA: Insufficient documentation

## 2014-09-07 DIAGNOSIS — R9439 Abnormal result of other cardiovascular function study: Secondary | ICD-10-CM | POA: Diagnosis not present

## 2014-09-07 HISTORY — PX: CARDIAC CATHETERIZATION: SHX172

## 2014-09-07 LAB — CBC
HCT: 37.7 % — ABNORMAL LOW (ref 39.0–52.0)
Hemoglobin: 13 g/dL (ref 13.0–17.0)
MCH: 30.1 pg (ref 26.0–34.0)
MCHC: 34.5 g/dL (ref 30.0–36.0)
MCV: 87.3 fL (ref 78.0–100.0)
Platelets: 162 10*3/uL (ref 150–400)
RBC: 4.32 MIL/uL (ref 4.22–5.81)
RDW: 12.7 % (ref 11.5–15.5)
WBC: 7.8 10*3/uL (ref 4.0–10.5)

## 2014-09-07 LAB — BASIC METABOLIC PANEL
Anion gap: 7 (ref 5–15)
BUN: 10 mg/dL (ref 6–20)
CALCIUM: 8.9 mg/dL (ref 8.9–10.3)
CO2: 26 mmol/L (ref 22–32)
Chloride: 106 mmol/L (ref 101–111)
Creatinine, Ser: 1.06 mg/dL (ref 0.61–1.24)
GFR calc Af Amer: >60 mL/min (ref >60)
GLUCOSE: 93 mg/dL (ref 65–99)
Potassium: 3.7 mmol/L (ref 3.5–5.1)
SODIUM: 139 mmol/L (ref 135–145)

## 2014-09-07 LAB — PROTIME-INR
INR: 1.29 (ref 0.00–1.49)
PROTHROMBIN TIME: 16.3 s — AB (ref 11.6–15.2)

## 2014-09-07 SURGERY — LEFT HEART CATH AND CORONARY ANGIOGRAPHY
Anesthesia: LOCAL

## 2014-09-07 MED ORDER — SODIUM CHLORIDE 0.9 % IV SOLN: INTRAVENOUS | Status: DC

## 2014-09-07 MED ORDER — SODIUM CHLORIDE 0.9 % IJ SOLN: 3.0000 mL | INTRAMUSCULAR | Status: DC | PRN

## 2014-09-07 MED ORDER — ACETAMINOPHEN 325 MG PO TABS: 650.0000 mg | ORAL_TABLET | ORAL | Status: DC | PRN

## 2014-09-07 MED ORDER — IOHEXOL 350 MG/ML SOLN
INTRAVENOUS | Status: DC | PRN
  Administered 2014-09-07: 85 mL via INTRAVENOUS

## 2014-09-07 MED ORDER — FENTANYL CITRATE (PF) 100 MCG/2ML IJ SOLN
INTRAMUSCULAR | Status: AC
  Filled 2014-09-07: qty 2

## 2014-09-07 MED ORDER — NITROGLYCERIN 1 MG/10 ML FOR IR/CATH LAB
INTRA_ARTERIAL | Status: AC
  Filled 2014-09-07: qty 10

## 2014-09-07 MED ORDER — LIDOCAINE HCL (PF) 1 % IJ SOLN
INTRAMUSCULAR | Status: AC
  Filled 2014-09-07: qty 30

## 2014-09-07 MED ORDER — NITROGLYCERIN 1 MG/10 ML FOR IR/CATH LAB
INTRA_ARTERIAL | Status: DC | PRN
  Administered 2014-09-07: 14:00:00

## 2014-09-07 MED ORDER — HEPARIN SODIUM (PORCINE) 1000 UNIT/ML IJ SOLN
INTRAMUSCULAR | Status: DC | PRN
  Administered 2014-09-07: 4500 [IU] via INTRAVENOUS

## 2014-09-07 MED ORDER — SODIUM CHLORIDE 0.9 % IJ SOLN: 3.0000 mL | Freq: Two times a day (BID) | INTRAMUSCULAR | Status: DC

## 2014-09-07 MED ORDER — FENTANYL CITRATE (PF) 100 MCG/2ML IJ SOLN
INTRAMUSCULAR | Status: DC | PRN
  Administered 2014-09-07: 25 ug via INTRAVENOUS
  Administered 2014-09-07: 50 ug via INTRAVENOUS

## 2014-09-07 MED ORDER — SODIUM CHLORIDE 0.9 % IV SOLN: 250.0000 mL | INTRAVENOUS | Status: DC | PRN

## 2014-09-07 MED ORDER — MIDAZOLAM HCL 2 MG/2ML IJ SOLN
INTRAMUSCULAR | Status: AC
  Filled 2014-09-07: qty 2

## 2014-09-07 MED ORDER — ONDANSETRON HCL 4 MG/2ML IJ SOLN
4.0000 mg | Freq: Four times a day (QID) | INTRAMUSCULAR | Status: DC | PRN
Start: 2014-09-07 — End: 2014-09-07

## 2014-09-07 MED ORDER — ASPIRIN 81 MG PO CHEW
CHEWABLE_TABLET | ORAL | Status: AC
  Filled 2014-09-07: qty 1

## 2014-09-07 MED ORDER — VERAPAMIL HCL 2.5 MG/ML IV SOLN
INTRAVENOUS | Status: AC
  Filled 2014-09-07: qty 2

## 2014-09-07 MED ORDER — MIDAZOLAM HCL 2 MG/2ML IJ SOLN
INTRAMUSCULAR | Status: DC | PRN
  Administered 2014-09-07: 1 mg via INTRAVENOUS
  Administered 2014-09-07: 2 mg via INTRAVENOUS

## 2014-09-07 MED ORDER — VERAPAMIL HCL 2.5 MG/ML IV SOLN
INTRAVENOUS | Status: DC | PRN
  Administered 2014-09-07: 14:00:00 via INTRA_ARTERIAL

## 2014-09-07 MED ORDER — HEPARIN (PORCINE) IN NACL 2-0.9 UNIT/ML-% IJ SOLN
INTRAMUSCULAR | Status: AC
  Filled 2014-09-07: qty 1000

## 2014-09-07 MED ORDER — SODIUM CHLORIDE 0.9 % WEIGHT BASED INFUSION: 1.0000 mL/kg/h | INTRAVENOUS | Status: DC

## 2014-09-07 MED ORDER — ASPIRIN 81 MG PO CHEW
81.0000 mg | CHEWABLE_TABLET | ORAL | Status: AC
  Administered 2014-09-07: 81 mg via ORAL

## 2014-09-07 SURGICAL SUPPLY — 12 items
CATH INFINITI 5 FR JL3.5 (CATHETERS) ×2 IMPLANT
CATH INFINITI 5FR ANG PIGTAIL (CATHETERS) ×2 IMPLANT
CATH INFINITI JR4 5F (CATHETERS) ×2 IMPLANT
DEVICE RAD COMP TR BAND LRG (VASCULAR PRODUCTS) ×2 IMPLANT
GLIDESHEATH SLEND SS 6F .021 (SHEATH) ×2 IMPLANT
KIT HEART LEFT (KITS) ×2 IMPLANT
PACK CARDIAC CATHETERIZATION (CUSTOM PROCEDURE TRAY) ×2 IMPLANT
SYR MEDRAD MARK V 150ML (SYRINGE) ×2 IMPLANT
TRANSDUCER W/STOPCOCK (MISCELLANEOUS) ×2 IMPLANT
TUBING CIL FLEX 10 FLL-RA (TUBING) ×2 IMPLANT
WIRE HI TORQ VERSACORE-J 145CM (WIRE) ×1 IMPLANT
WIRE SAFE-T 1.5MM-J .035X260CM (WIRE) ×3 IMPLANT

## 2014-09-07 NOTE — Interval H&P Note (Signed)
History and Physical Interval Note:  09/07/2014 1:19 PM Cath Lab Visit (complete for each Cath Lab visit)  Clinical Evaluation Leading to the Procedure:   ACS: No.  Non-ACS:    Anginal Classification: CCS III  Anti-ischemic medical therapy: Minimal Therapy (1 class of medications)  Non-Invasive Test Results: Intermediate-risk stress test findings: cardiac mortality 1-3%/year  Prior CABG: No previous CABG        Terry [name]  has presented today for surgery, with the diagnosis of abnormal myoview  The various methods of treatment have been discussed with the patient and family. After consideration of risks, benefits and other options for treatment, the patient has consented to  Procedure(s): Left Heart Cath and Coronary Angiography (N/A) as a surgical intervention .  The patient's history has been reviewed, patient examined, no change in status, stable for surgery.  I have reviewed the patient's chart and labs.  Questions were answered to the patient's satisfaction.     Temesha Queener [employer]

## 2014-09-07 NOTE — Discharge Instructions (Signed)
Radial Site Care Refer to this sheet in the next few weeks. These instructions provide you with information on caring for yourself after your procedure. Your caregiver may also give you more specific instructions. Your treatment has been planned according to current medical practices, but problems sometimes occur. Call your caregiver if you have any problems or questions after your procedure. HOME CARE INSTRUCTIONS  You may shower the day after the procedure.Remove the bandage (dressing) and gently wash the site with plain soap and water.Gently pat the site dry.  Do not apply powder or lotion to the site.  Do not submerge the affected site in water for 3 to 5 days.  Inspect the site at least twice daily.  Do not flex or bend the affected arm for 24 hours.  No lifting over 5 pounds (2.3 kg) for 5 days after your procedure.  Do not drive home if you are discharged the same day of the procedure. Have someone else drive you.  You may drive 24 hours after the procedure unless otherwise instructed by your caregiver.  Do not operate machinery or power tools for 24 hours.  A responsible adult should be with you for the first 24 hours after you arrive home. What to expect:  Any bruising will usually fade within 1 to 2 weeks.  Blood that collects in the tissue (hematoma) may be painful to the touch. It should usually decrease in size and tenderness within 1 to 2 weeks. SEEK IMMEDIATE MEDICAL CARE IF:  You have unusual pain at the radial site.  You have redness, warmth, swelling, or pain at the radial site.  You have drainage (other than a small amount of blood on the dressing).  You have chills.  You have a fever or persistent symptoms for more than 72 hours.  You have a fever and your symptoms suddenly get worse.  Your arm becomes pale, cool, tingly, or numb.  You have heavy bleeding from the site. Hold pressure on the site. Document Released: 03/04/2010 Document Revised:  04/24/2011 Document Reviewed: 03/04/2010 [facility] Patient Information 2015 [place], [place]. This information is not intended to replace advice given to you by your health care provider. Make sure you discuss any questions you have with your health care provider.                          Return To Work __________________________________________________ was treated at our facility. 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 Your employer should be aware of your condition and can help with the necessary work activity restrictions. If you wish to return to work sooner than the date above, or if you have further problems which make it difficult for you to return at that time, please call us or your caregiver.   ___________Dalton [name] MD______________________________ Physician Name (Printed)   _________________________________________ Physician Signature    ___________7/25/16________________________ Date Document Released: 01/30/2005 Document Revised: 04/24/2011  Document Reviewed: 07/17/2006 [facility] Patient Information 2015 [place], [place]. This information is not intended to replace advice given to you by your health care provider. Make sure you discuss any questions you have with your health care provider.

## 2014-09-07 NOTE — H&P (View-Only) (Signed)
Patient ID: Terry [name], male   DOB: 05/14/1955, 59 y.o.   MRN: [id number] Primary Care: William Harris, MD Primary Cardiologist: Traci Turner, MD  HPI: Terry [name] is a 59 y.o. 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 He presents as a new patient to the clinic today. He has had notable LV EF decline in the past six months despite guideline directed therapy by Dr. Traci Turner and has been referred to us for evaluation.  Back in 2006, an echo showed EF 15-20%.  LHC at that time showed normal coronaries.  He has been managed for cardiomyopathy since that time. In 12/14, EF had improved to 50%.  However, he has had a decline since then.  Last echo in 5/16 showed EF 25%.  Atrial fibrillation was first noted in 2013.  In 7/15, he had DCCV to NSR.  In 1/16, he was noted to be back in atrial fibrillation and appears to have been in atrial fibrillation since that time. He had St Jude ICD placed by Dr Taylor in 6/16.    He returns today for HF follow up. At his previous appointment, we switched from ACEi to Entresto.  Given fall in EF, Lexiscan Cardiolite was done. This showed EF 39% with partially reversible inferior and apical perfusion defect. Still can do whatever he wants without SOB on flat ground and several flights of stairs. Taking all medicines as directed, has not missed any. Still low energy with afib. Has only occasional palpitations. Denies peripheral edema, orthopnea, PND, lightheadedness, syncope/near syncope, or CP.    Labs (5/16): HIV negative, ferritin normal, immunofixation with no monoclonal protein.  Labs (6/16): K 4.2, Creatinine 1.08, HCT 37.5 Labs (08/18/14): K 4.2, Creatinine 1.06  ECG: Atrial fibrillation with rate 95, narrow QRS, nonspecific T wave changes, QTc 495 msec  PMH 1. Nonischemic dilated cardiomyopathy: Echo (2006) with EF 15-20%.  LHC in 10/06 showed normal coronaries.  By 2014, EF had  improved to 50%.  Echo in 1/16 showed EF 35-40%.  Echo (5/16) showed EF 25% with mild MR.  St Jude ICD 6/16.  HIV negative, immunofixation with no monoclonal protein, and ferritin normal.  Lexiscan Cardiolite (7/16) with EF 39% and partially reversible inferior and apical perfusion defect (intermediate risk).   2. Atrial fibrillation: Paroxysmal.  First noted in 2013.  DCCV in 7/15.  Back in atrial fibrillation 1/16 and has remained in atrial fibrillation since that time.  3. HTN 4. Rheumatoid Arthritis: Has been off all medications for several years.   5. H/o TKR, h/o THR 6. Gynecomastia with spironolactone.   SH: Lives at home with wife and 2 children and a grandchild. Never smoker, drinks 1 or 2 beers a week with dinner. 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   FH: Father with MI at 51, brother with atrial fibrillation, mother with renal failure.   ROS: All systems reviewed and negative except as per HPI.   Current Outpatient Prescriptions  Medication Sig Dispense Refill  . carvedilol (COREG) 25 MG tablet TAKE 1 TABLET TWICE A DAY 180 tablet 1  . dofetilide (TIKOSYN) 500 MCG capsule Take 1 capsule (500 mcg total) by mouth 2 (two) times daily. (Patient not taking: Reported on 08/20/2014) 60 capsule 3  . furosemide (LASIX) 40 MG tablet TAKE 1 TABLET TWICE A DAY 180 tablet 1  . hydrocortisone cream 1 % Apply 1 application topically 3 (three) times daily as   needed for itching (skin irritation). (Patient not taking: Reported on 08/20/2014) 30 g 0  . potassium chloride SA (K-DUR,KLOR-CON) 20 MEQ tablet TAKE 1 TABLET TWICE A DAY 180 tablet 1  . rivaroxaban (XARELTO) 20 MG TABS tablet Take 1 tablet (20 mg total) by mouth daily with supper. Resume on 08/07/14 30 tablet 3  . sacubitril-valsartan (ENTRESTO) 24-26 MG Take 1 tablet by mouth 2 (two) times daily. 60 tablet 3   No current facility-administered medications for this encounter.    Allergies  Allergen Reactions  . Sulfa Drugs Cross Reactors  Hives and Other (See Comments)    thrush   There were no vitals taken for this visit. PHYSICAL EXAM: General:  Well appearing. No respiratory difficulty HEENT: normal Neck: supple. no JVD. Carotids 2+ bilat; no bruits. No lymphadenopathy or thryomegaly appreciated. Cor: PMI nondisplaced. Regular rate & rhythm. No rubs, gallops or murmurs appreciated Lungs: clear Abdomen: soft, nontender, nondistended. No hepatosplenomegaly. No bruits or masses. Good bowel sounds. Extremities: no cyanosis, clubbing, rash, edema Neuro: alert & oriented x 3, cranial nerves grossly intact. moves all 4 extremities w/o difficulty. Affect pleasant.   ASSESSMENT & PLAN:  1. Chronic systolic HF: Suspected nonischemic cardiomyopathy x years. NYHA class II.  He has a Secondary school teacher ICD and is not a candidate for CRT due to narrow QRS. Given fall in EF, we had set him up for a Cardiolite in 7/16.  This was intermediate risk with possible inferior and apical ischemia.  He denies chest pain.  - Continue Lasix 40 mg bid - Continue current Coreg.  - Continue Entresto 24/26 mg bid  - Gynecomastia with spironolactone, will try him on eplerenone 25 mg daily.   - With fall in EF and intermediate risk Cardiolite, I will arrange for coronary angiography.  I discussed cath with patient and wife with description of risks and benefits.  They agree to proceed.  - Given fall in EF corresponding to atrial fibrillation, think it would be reasonable to get him out of atrial fibrillation (see below).  - Encouraged daily weights, fluid and salt restriction. 2.  Persistent atrial fibrillation: Since at least 1/16.  He was cardioverted in 7/15.  He feels less energetic in atrial fibrillation. Fall in EF also corresponds to going back into atrial fibrillation.  Do not think that he would hold NSR without anti-arrhythmic.   - He will have to hold Xarelto for cath.  After cath, restart Xarelto.  After 3-4 weeks, I will plan to admit him for  dofetilide initiation to see if we can get him back in NSR.  I will refer him to pharmacy clinic for dofetilide initiation.  - Continue Xarelto 20 mg daily, hold for 3 days for cath.   Follow-up in 1 month.   Mariam Dollar Tillery PA-C 08/28/2014   Patient seen with PA, agree with the above note.  Intermediate risk stress test, coupled with fall in EF, will plan coronary angiography.  After ischemic evaluation, would plan to try to get him out of fibrillation.  After restarting Xarelto, he will go 3-4 weeks then will plan to admit for Tikosyn initiation.  Refer to pharmacy clinic.   Also will start eplerenone with BMET in 2 wks.   Marca Ancona 08/29/2014

## 2014-09-08 ENCOUNTER — Encounter (HOSPITAL_COMMUNITY): Payer: Self-pay | Admitting: Cardiology

## 2014-09-09 ENCOUNTER — Ambulatory Visit (INDEPENDENT_AMBULATORY_CARE_PROVIDER_SITE_OTHER): Payer: Commercial Managed Care - HMO | Admitting: Cardiology

## 2014-09-09 ENCOUNTER — Encounter: Payer: Self-pay | Admitting: Cardiology

## 2014-09-09 ENCOUNTER — Other Ambulatory Visit (INDEPENDENT_AMBULATORY_CARE_PROVIDER_SITE_OTHER): Payer: Commercial Managed Care - HMO | Admitting: *Deleted

## 2014-09-09 ENCOUNTER — Ambulatory Visit: Payer: Commercial Managed Care - HMO | Admitting: Pharmacist

## 2014-09-09 VITALS — BP 100/72 | HR 72 | Ht 71.0 in | Wt 214.6 lb

## 2014-09-09 DIAGNOSIS — I5022 Chronic systolic (congestive) heart failure: Secondary | ICD-10-CM

## 2014-09-09 DIAGNOSIS — I429 Cardiomyopathy, unspecified: Secondary | ICD-10-CM

## 2014-09-09 DIAGNOSIS — I4819 Other persistent atrial fibrillation: Secondary | ICD-10-CM

## 2014-09-09 DIAGNOSIS — I420 Dilated cardiomyopathy: Secondary | ICD-10-CM

## 2014-09-09 DIAGNOSIS — I10 Essential (primary) hypertension: Secondary | ICD-10-CM | POA: Diagnosis not present

## 2014-09-09 DIAGNOSIS — I481 Persistent atrial fibrillation: Secondary | ICD-10-CM | POA: Diagnosis not present

## 2014-09-09 DIAGNOSIS — I77810 Thoracic aortic ectasia: Secondary | ICD-10-CM

## 2014-09-09 LAB — BASIC METABOLIC PANEL
BUN: 11 mg/dL (ref 6–23)
CALCIUM: 9.3 mg/dL (ref 8.4–10.5)
CO2: 28 mEq/L (ref 19–32)
Chloride: 101 mEq/L (ref 96–112)
Creatinine, Ser: 1.12 mg/dL (ref 0.40–1.50)
GFR: 71.3 mL/min (ref >60.00)
Glucose, Bld: 86 mg/dL (ref 70–99)
POTASSIUM: 4.1 meq/L (ref 3.5–5.1)
Sodium: 136 mEq/L (ref 135–145)

## 2014-09-09 MED ORDER — EPLERENONE 25 MG PO TABS: 25.0000 mg | ORAL_TABLET | Freq: Every day | ORAL | Status: DC

## 2014-09-09 NOTE — Patient Instructions (Addendum)
Medication Instructions:  Your physician has recommended you make the following change in your medication: 1) START EPLERENONE 25 mg daily  Labwork: IN ONE WEEK: BMET  Testing/Procedures: None  Follow-Up: Your physician wants you to follow-up in: 6 months with Dr. Mayford Knife. You will receive a reminder letter in the mail two months in advance. If you don't receive a letter, please call our office to schedule the follow-up appointment.   Any Other Special Instructions Will Be Listed Below (If Applicable).

## 2014-09-09 NOTE — Addendum Note (Signed)
Addended by: Tonita Phoenix on: 09/09/2014 10:47 AM   Modules accepted: Orders

## 2014-09-09 NOTE — Progress Notes (Signed)
Cardiology Office Note   Date:  09/09/2014   ID:  Terry [name], DOB Dec 03, 1955, MRN [id number]  PCP:  [name], MD    Chief Complaint  Patient presents with  . Follow-up    Nonischemic dilated cardiomyopathy      History of Present Illness: Terry [name] is a 59 y.o. 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  He has had notable LV EF decline in the past six months despite guideline directed therapy and was recently referred to [facility] for evaluation. Back in 2006, an echo showed EF 15-20%. LHC at that time showed normal coronaries. He has been managed for cardiomyopathy since that time. In 12/14, EF had improved to 50%. However, he has had a decline since then. Last echo in 5/16 showed EF 25%. Atrial fibrillation was first noted in 2013. In 7/15, he had DCCV to NSR. In 1/16, he was noted to be back in atrial fibrillation and it was decided that since he was asymptomatic that we would pursue rate control for his atrial fibrillation since that time. He had St Jude ICD placed by Dr [name] in 6/16. He was not a candidate for CRT due to narrow complex QRS.   He was seen by Dr. [name] and switched from ACEi to [facility]. He also had had some gynecomastia on spironolactone and was switched to eplerenone but a prescription was never called in.  Given fall in EF, Lexiscan Cardiolite was done. This showed EF 39% with partially reversible inferior and apical perfusion defect. Due to intermediate risk of this study he underwent cardiac cath which revealed normal coronary arteries with EF 35-40%. He was restarted on Xarelto on 7/25 and plan is for initiation of Tikosyn once he has been back on Xarelto for 4 weeks.   He has been taking all medicines as directed, has not missed any. Denies peripheral edema, orthopnea, PND, lightheadedness, syncope/near syncope, or CP. He is NYHA  Class II.      Past Medical History  Diagnosis Date  . GERD (gastroesophageal reflux disease)   . H/O hiatal hernia   . Aortic insufficiency     mild   . Ejection fraction < 50%     by echo 08/25/11   . H/O hematuria   . 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 Hypertension   . Aortic root dilatation   . AICD (automatic cardioverter/defibrillator) present   . Complication of anesthesia     hx of irregular beat after anesthesia in ~ 1990's  . Chronic systolic CHF (congestive heart failure), NYHA class 1   . Atrial fibrillation     "off and on since 2013" (08/05/2014)  . Dysrhythmia   . Rheumatoid arthritis dx'd 1995  . Anemia     "S/P knee & hip OR"  . History of blood transfusion 2013; 2014    S/P knee and hip OR  . 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 Nasal septum surgery  ~ 1975  . Urethral stricture dilatation  "several times"  . Foot neuroma surgery Right     related to benign tumor   . Colonoscopy  2010; 2015    "polyps; no polyps"  . Total hip arthroplasty  09/08/2011    Procedure: TOTAL HIP ARTHROPLASTY ANTERIOR APPROACH;  Surgeon: [name], MD;  Location: [facility] ORS;  Service: Orthopedics;  Laterality: Right;  Right total hip replacement, left knee steroid injection  . Total knee arthroplasty Left 11/29/2012    Procedure: LEFT TOTAL KNEE ARTHROPLASTY;  Surgeon: [name], MD;  Location: [facility];  Service: Orthopedics;  Laterality: Left;  FEMORAL NERVE BLOCK IN HOLDING AREA LEFT LEG  . Cardiac catheterization  ~ 2004    normal  . Cardioversion N/A 08/21/2013    Procedure: CARDIOVERSION;  Surgeon: Quintella Reichert, MD;  Location: [facility] ENDOSCOPY;  Service: Cardiovascular;  Laterality: N/A;  . Cardiac defibrillator placement  08/05/2014    St. Jude  . Joint replacement    . Tonsillectomy    . Urethral fistula repair  ~ 1996  . Ep implantable device N/A 08/05/2014     Procedure: ICD Implant;  Surgeon: [name], MD;  Location: [facility] INVASIVE CV LAB;  Service: Cardiovascular;  Laterality: N/A;  . Cardiac catheterization N/A 09/07/2014    Procedure: Left Heart Cath and Coronary Angiography;  Surgeon: Laurey Morale, MD;  Location: [facility] INVASIVE CV LAB;  Service: Cardiovascular;  Laterality: N/A;     Current Outpatient Prescriptions  Medication Sig Dispense Refill  . carvedilol (COREG) 25 MG tablet TAKE 1 TABLET TWICE A DAY 180 tablet 1  . eplerenone (INSPRA) 25 MG tablet Take 1 tablet (25 mg total) by mouth daily. 30 tablet 3  . furosemide (LASIX) 40 MG tablet TAKE 1 TABLET TWICE A DAY 180 tablet 1  . hydrocortisone cream 1 % Apply 1 application topically 3 (three) times daily as needed for itching (skin irritation). 30 g 0  . potassium chloride SA (K-DUR,KLOR-CON) 20 MEQ tablet TAKE 1 TABLET TWICE A DAY 180 tablet 1  . rivaroxaban (XARELTO) 20 MG TABS tablet Take 1 tablet (20 mg total) by mouth daily with supper. Resume on 08/07/14 30 tablet 3  . sacubitril-valsartan (ENTRESTO) 24-26 MG Take 1 tablet by mouth 2 (two) times daily. 60 tablet 3   No current facility-administered medications for this visit.    Allergies:   Sulfa drugs cross reactors    Social History:  The patient  reports that he has never smoked. He has never used smokeless tobacco. He reports that he drinks about 0.6 oz of alcohol per week. He reports that he does not use illicit drugs.   Family History:  The patient's family history includes Heart attack (age of onset: 63) in his father; Heart disease in his father; Heart failure in his brother.    ROS:  Please see the history of present illness.   Otherwise, review of systems are positive for none.   All other systems are reviewed and negative.    PHYSICAL EXAM: VS:  BP 100/72 mmHg  Pulse 72  Ht 5\' 11"  (1.803 m)  Wt 214 lb 9.6 oz (97.342 kg)  BMI 29.94 kg/m2  SpO2 99% , BMI Body mass index is 29.94 kg/(m^2). GEN: Well  nourished, well developed, in no acute distress HEENT: normal Neck: no JVD, carotid bruits, or masses Cardiac: RRR; no murmurs, rubs, or gallops,no edema  Respiratory:  clear to auscultation bilaterally, normal work of breathing GI: soft, nontender, nondistended, + BS MS: no deformity or atrophy Skin: warm and dry, no rash Neuro:  Strength and sensation are intact Psych: euthymic mood, full affect   EKG:  EKG is not ordered today.    Recent Labs:  09/07/2014: BUN 10; Creatinine, Ser 1.06; Hemoglobin 13.0; Platelets 162; Potassium 3.7; Sodium 139    Lipid Panel No results found for: CHOL, TRIG, HDL, CHOLHDL, VLDL, LDLCALC, LDLDIRECT    Wt Readings from Last 3 Encounters:  09/09/14 214 lb 9.6 oz (97.342 kg)  09/07/14 210 lb (95.255 kg)  08/28/14 215 lb 4 oz (97.637 kg)    ASSESSMENT & PLAN:  1. Chronic systolic HF: Suspected nonischemic cardiomyopathy x years. NYHA class II. He has a Secondary school teacher ICD and is not a candidate for CRT due to narrow QRS. Cardiolite 7/16 intermediate risk with possible inferior and apical ischemia but cath showed normal coronary arteries. He denies chest pain.  - Continue Lasix/Coreg/Entresto. - Start Eplerenone - it was not called in at CHF OV.  Check BMET in 1 week - Agree with Dr. [name], given fall in EF corresponding to atrial fibrillation, it would be reasonable to get him out of atrial fibrillation (see below).  - Encouraged daily weights, fluid and salt restriction. 2. Persistent atrial fibrillation: Since at least 1/16. He was cardioverted in 7/15. He feels less energetic in atrial fibrillation. Fall in EF also corresponds to going back into atrial fibrillation. Do not think that he would hold NSR without anti-arrhythmic.  - Continue Xarelto.  Dr. [name] plans to admit him for dofetilide initiation to see if we can get him back in NSR after being back on NOAC for 4 weeks. he has been referred to pharmacy clinic for dofetilide initiation.   3.  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  Nonischemic DCM s/p St. Jude ICD 5.  HTN - controlled on medical therapy  Current medicines are reviewed at length with the patient today.  The patient does not have concerns regarding medicines.  The following changes have been made:  no change  Labs/ tests ordered today: See above Assessment and Plan No orders of the defined types were placed in this encounter.     Disposition:   FU with me in 6 months  Signed, Quintella Reichert, MD  09/09/2014 10:55 AM    [facility] [address] [place], [place], [place]  [address] Phone: [phone]; Fax: [phone]

## 2014-09-16 ENCOUNTER — Other Ambulatory Visit (INDEPENDENT_AMBULATORY_CARE_PROVIDER_SITE_OTHER): Payer: Commercial Managed Care - HMO

## 2014-09-16 DIAGNOSIS — I10 Essential (primary) hypertension: Secondary | ICD-10-CM

## 2014-09-16 DIAGNOSIS — I429 Cardiomyopathy, unspecified: Secondary | ICD-10-CM | POA: Diagnosis not present

## 2014-09-16 DIAGNOSIS — I420 Dilated cardiomyopathy: Secondary | ICD-10-CM

## 2014-09-16 LAB — BASIC METABOLIC PANEL
BUN: 22 mg/dL (ref 6–23)
CALCIUM: 9.2 mg/dL (ref 8.4–10.5)
CHLORIDE: 99 meq/L (ref 96–112)
CO2: 28 mEq/L (ref 19–32)
CREATININE: 1.18 mg/dL (ref 0.40–1.50)
GFR: 67.13 mL/min (ref >60.00)
GLUCOSE: 77 mg/dL (ref 70–99)
POTASSIUM: 4.7 meq/L (ref 3.5–5.1)
Sodium: 133 mEq/L — ABNORMAL LOW (ref 135–145)

## 2014-09-21 ENCOUNTER — Encounter: Payer: Self-pay | Admitting: Internal Medicine

## 2014-09-28 ENCOUNTER — Other Ambulatory Visit: Payer: Self-pay

## 2014-09-28 MED ORDER — RIVAROXABAN 20 MG PO TABS: 20.0000 mg | ORAL_TABLET | Freq: Every day | ORAL | Status: DC

## 2014-09-30 ENCOUNTER — Ambulatory Visit (HOSPITAL_COMMUNITY)
Admission: RE | Admit: 2014-09-30 | Discharge: 2014-09-30 | Disposition: A | Discharge status: Home / Self Care | Payer: Commercial Managed Care - HMO | Source: Ambulatory Visit | Attending: Internal Medicine | Admitting: Internal Medicine

## 2014-09-30 ENCOUNTER — Encounter (HOSPITAL_COMMUNITY): Payer: Self-pay

## 2014-09-30 VITALS — BP 102/66 | HR 80 | Wt 211.8 lb

## 2014-09-30 DIAGNOSIS — M069 Rheumatoid arthritis, unspecified: Secondary | ICD-10-CM | POA: Insufficient documentation

## 2014-09-30 DIAGNOSIS — Z8249 Family history of ischemic heart disease and other diseases of the circulatory system: Secondary | ICD-10-CM | POA: Diagnosis not present

## 2014-09-30 DIAGNOSIS — Z79899 Other long term (current) drug therapy: Secondary | ICD-10-CM | POA: Insufficient documentation

## 2014-09-30 DIAGNOSIS — I481 Persistent atrial fibrillation: Secondary | ICD-10-CM

## 2014-09-30 DIAGNOSIS — Z7902 Long term (current) use of antithrombotics/antiplatelets: Secondary | ICD-10-CM | POA: Diagnosis not present

## 2014-09-30 DIAGNOSIS — I4819 Other persistent atrial fibrillation: Secondary | ICD-10-CM

## 2014-09-30 DIAGNOSIS — I5022 Chronic systolic (congestive) heart failure: Secondary | ICD-10-CM | POA: Diagnosis not present

## 2014-09-30 DIAGNOSIS — I10 Essential (primary) hypertension: Secondary | ICD-10-CM | POA: Insufficient documentation

## 2014-09-30 DIAGNOSIS — I420 Dilated cardiomyopathy: Secondary | ICD-10-CM | POA: Diagnosis not present

## 2014-09-30 DIAGNOSIS — Z95810 Presence of automatic (implantable) cardiac defibrillator: Secondary | ICD-10-CM | POA: Diagnosis not present

## 2014-09-30 NOTE — Progress Notes (Signed)
Advanced Heart Failure Medication Review by a Pharmacist  Does the patient  feel that his/her medications are working for him/her?  yes  Has the patient been experiencing any side effects to the medications prescribed?  no  Does the patient measure his/her own blood pressure or blood glucose at home?  yes   Does the patient have any problems obtaining medications due to transportation or finances?   no  Understanding of regimen: excellent Understanding of indications: excellent Potential of compliance: good    Pharmacist comments: Terry [name] is a pleasant 59 yo M presenting with his wife. He did not have his medication list with him today but he was able to verbalize every medication he was taking including dosages. He did have some questions about Tikosyn initiation next week and we discussed what his appointment with Kennon Rounds would entail. No other medication-related questions or concerns were identified during this visit.   Tyler Deis. Bonnye Fava, PharmD, BCPS, CPP Clinical Pharmacist Pager: [phone] Phone: [phone] 09/30/2014 11:46 AM

## 2014-09-30 NOTE — Patient Instructions (Signed)
Your physician recommends that you schedule a follow-up appointment in: 6 weeks  

## 2014-10-01 NOTE — Progress Notes (Signed)
Patient ID: Terry [name], male   DOB: 1955-09-09, 59 y.o.   MRN: [id number] Primary Care: [name], MD Primary Cardiologist: Armanda Magic, MD  HPI: CHADDRICK BRUE is a 59 y.o. 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 He presents as a new patient to the clinic today. He has had notable LV EF decline in the past six months despite guideline directed therapy by Dr. Armanda Magic and has been referred to [place] for evaluation.  Back in 2006, an echo showed EF 15-20%.  LHC at that time showed normal coronaries.  He has been managed for cardiomyopathy since that time. In 12/14, EF had improved to 50%.  However, he has had a decline since then.  Last echo in 5/16 showed EF 25%.  Atrial fibrillation was first noted in 2013.  In 7/15, he had DCCV to NSR.  In 1/16, he was noted to be back in atrial fibrillation and appears to have been in atrial fibrillation since that time. He had St Jude ICD placed by Dr [name] in 6/16.  Given fall in EF, Lexiscan Cardiolite was done. This showed EF 39% with partially reversible inferior and apical perfusion defect. He had LHC in 7/16 showing no significant CAD.  Generally doing well.  Painful gynecomastia resolved off spironolactone, now on eplerenone.  No exertional dyspnea.  Stays active at work and doing yardwork at home.  No orthopnea/PND.  No chest pain.  No lightheadedness or syncope.   Corevue was checked today and showed stable thoracic impedance.   ECG: atrial fibrillation, LAFB, QTc 441 msec    Labs (5/16): HIV negative, ferritin normal, immunofixation with no monoclonal protein.  Labs (6/16): K 4.2, Creatinine 1.08, HCT 37.5 Labs (08/18/14): K 4.2, Creatinine 1.0 Labs (8/16): K 4.7, creatinine 1.18  PMH 1. Nonischemic dilated cardiomyopathy: Echo (2006) with EF 15-20%.  LHC in 10/06 showed normal coronaries.  By 2014, EF had improved to 50%.  Echo in 1/16 showed EF 35-40%.  Echo  (5/16) showed EF 25% with mild MR.  St Jude ICD 6/16.  HIV negative, immunofixation with no monoclonal protein, and ferritin normal.  Lexiscan Cardiolite (7/16) with EF 39% and partially reversible inferior and apical perfusion defect (intermediate risk).   LHC (7/16) with EF 35-40%, no significant CAD.  2. Atrial fibrillation: Paroxysmal.  First noted in 2013.  DCCV in 7/15.  Back in atrial fibrillation 1/16 and has remained in atrial fibrillation since that time.  3. HTN 4. Rheumatoid Arthritis: Has been off all medications for several years.   5. H/o TKR, h/o THR 6. Gynecomastia with spironolactone.   SH: Lives at home with wife and 2 children and a grandchild. Never smoker, drinks 1 or 2 beers a week with dinner. 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   FH: Father with MI at 48, brother with atrial fibrillation, mother with renal failure.   ROS: All systems reviewed and negative except as per HPI.   Current Outpatient Prescriptions  Medication Sig Dispense Refill  . carvedilol (COREG) 25 MG tablet TAKE 1 TABLET TWICE A DAY 180 tablet 1  . eplerenone (INSPRA) 25 MG tablet Take 1 tablet (25 mg total) by mouth daily. 30 tablet 6  . furosemide (LASIX) 40 MG tablet TAKE 1 TABLET TWICE A DAY 180 tablet 1  . potassium chloride SA (K-DUR,KLOR-CON) 20 MEQ tablet TAKE 1 TABLET TWICE A DAY 180 tablet 1  . rivaroxaban (  XARELTO) 20 MG TABS tablet Take 1 tablet (20 mg total) by mouth daily with supper. Resume on 08/07/14 30 tablet 11  . sacubitril-valsartan (ENTRESTO) 24-26 MG Take 1 tablet by mouth 2 (two) times daily. 60 tablet 3   No current facility-administered medications for this encounter.    Allergies  Allergen Reactions  . Sulfa Drugs Cross Reactors Hives and Other (See Comments)    thrush   BP 102/66 mmHg  Pulse 80  Wt 211 lb 12 oz (96.049 kg)  SpO2 99% PHYSICAL EXAM: General:  Well appearing. No respiratory difficulty HEENT: normal Neck: supple. no JVD. Carotids 2+ bilat; no  bruits. No lymphadenopathy or thryomegaly appreciated. Cor: PMI nondisplaced. Regular rate & rhythm. No rubs, gallops or murmurs appreciated Lungs: clear Abdomen: soft, nontender, nondistended. No hepatosplenomegaly. No bruits or masses. Good bowel sounds. Extremities: no cyanosis, clubbing, rash, edema Neuro: alert & oriented x 3, cranial nerves grossly intact. moves all 4 extremities w/o difficulty. Affect pleasant.   ASSESSMENT & PLAN:  1. Chronic systolic HF: Nonischemic cardiomyopathy x years. NYHA class II.  He has a Secondary school teacher ICD and is not a candidate for CRT due to narrow QRS. 7/16 LHC showed no significant CAD.   - Continue Lasix 40 mg bid - Continue current Coreg.  - Continue Entresto 24/26 mg bid  - Gynecomastia resolved off spironolactone.  He is doing fine on eplerenone.  - Given fall in EF corresponding to onset of atrial fibrillation, think it would be reasonable to get him out of atrial fibrillation (see below).  - Encouraged daily weights, fluid and salt restriction. 2.  Persistent atrial fibrillation: Since at least 1/16.  He was cardioverted in 7/15.  He feels less energetic in atrial fibrillation. Fall in EF also corresponds to going back into atrial fibrillation.  Do not think that he would hold NSR without anti-arrhythmic.   - Plan for dofetilide initiation followed by DCCV if he remains in atrial fibrillation on dofetilide.  He has appointment in the Tikosyn clinic next week.   - Continue Xarelto 20 mg daily  Follow-up in 6 wks.  Marca Ancona 10/01/2014

## 2014-10-07 ENCOUNTER — Telehealth: Payer: Self-pay | Admitting: Pharmacist

## 2014-10-07 ENCOUNTER — Ambulatory Visit (INDEPENDENT_AMBULATORY_CARE_PROVIDER_SITE_OTHER): Payer: Commercial Managed Care - HMO | Admitting: Pharmacist

## 2014-10-07 ENCOUNTER — Inpatient Hospital Stay (HOSPITAL_COMMUNITY)
Admission: AD | Admit: 2014-10-07 | Discharge: 2014-10-10 | DRG: 309 | Disposition: A | Discharge status: Home / Self Care | Payer: Commercial Managed Care - HMO | Source: Ambulatory Visit | Attending: Cardiology | Admitting: Cardiology

## 2014-10-07 ENCOUNTER — Encounter (HOSPITAL_COMMUNITY): Payer: Self-pay

## 2014-10-07 DIAGNOSIS — I351 Nonrheumatic aortic (valve) insufficiency: Secondary | ICD-10-CM | POA: Diagnosis present

## 2014-10-07 DIAGNOSIS — I4819 Other persistent atrial fibrillation: Secondary | ICD-10-CM | POA: Diagnosis present

## 2014-10-07 DIAGNOSIS — I10 Essential (primary) hypertension: Secondary | ICD-10-CM | POA: Diagnosis present

## 2014-10-07 DIAGNOSIS — I480 Paroxysmal atrial fibrillation: Secondary | ICD-10-CM | POA: Diagnosis not present

## 2014-10-07 DIAGNOSIS — M069 Rheumatoid arthritis, unspecified: Secondary | ICD-10-CM | POA: Diagnosis present

## 2014-10-07 DIAGNOSIS — I77810 Thoracic aortic ectasia: Secondary | ICD-10-CM | POA: Diagnosis present

## 2014-10-07 DIAGNOSIS — Z7901 Long term (current) use of anticoagulants: Secondary | ICD-10-CM

## 2014-10-07 DIAGNOSIS — Z96652 Presence of left artificial knee joint: Secondary | ICD-10-CM | POA: Diagnosis present

## 2014-10-07 DIAGNOSIS — I420 Dilated cardiomyopathy: Secondary | ICD-10-CM | POA: Diagnosis present

## 2014-10-07 DIAGNOSIS — Z8249 Family history of ischemic heart disease and other diseases of the circulatory system: Secondary | ICD-10-CM | POA: Diagnosis not present

## 2014-10-07 DIAGNOSIS — Z882 Allergy status to sulfonamides status: Secondary | ICD-10-CM | POA: Diagnosis not present

## 2014-10-07 DIAGNOSIS — I481 Persistent atrial fibrillation: Principal | ICD-10-CM

## 2014-10-07 DIAGNOSIS — Z888 Allergy status to other drugs, medicaments and biological substances status: Secondary | ICD-10-CM | POA: Diagnosis not present

## 2014-10-07 DIAGNOSIS — Z96641 Presence of right artificial hip joint: Secondary | ICD-10-CM | POA: Diagnosis present

## 2014-10-07 DIAGNOSIS — K219 Gastro-esophageal reflux disease without esophagitis: Secondary | ICD-10-CM | POA: Diagnosis present

## 2014-10-07 DIAGNOSIS — I5022 Chronic systolic (congestive) heart failure: Secondary | ICD-10-CM | POA: Diagnosis present

## 2014-10-07 DIAGNOSIS — Z95810 Presence of automatic (implantable) cardiac defibrillator: Secondary | ICD-10-CM | POA: Diagnosis not present

## 2014-10-07 DIAGNOSIS — I4891 Unspecified atrial fibrillation: Secondary | ICD-10-CM | POA: Diagnosis not present

## 2014-10-07 DIAGNOSIS — Z79899 Other long term (current) drug therapy: Secondary | ICD-10-CM

## 2014-10-07 LAB — MAGNESIUM: MAGNESIUM: 2.2 mg/dL (ref 1.5–2.5)

## 2014-10-07 LAB — BASIC METABOLIC PANEL
BUN: 26 mg/dL — ABNORMAL HIGH (ref 6–23)
CALCIUM: 9.3 mg/dL (ref 8.4–10.5)
CO2: 26 mEq/L (ref 19–32)
Chloride: 100 mEq/L (ref 96–112)
Creatinine, Ser: 1.27 mg/dL (ref 0.40–1.50)
GFR: 61.65 mL/min (ref >60.00)
Glucose, Bld: 82 mg/dL (ref 70–99)
POTASSIUM: 4.8 meq/L (ref 3.5–5.1)
SODIUM: 132 meq/L — AB (ref 135–145)

## 2014-10-07 MED ORDER — FUROSEMIDE 40 MG PO TABS
40.0000 mg | ORAL_TABLET | Freq: Two times a day (BID) | ORAL | Status: DC
  Filled 2014-10-07: qty 1

## 2014-10-07 MED ORDER — CARVEDILOL 25 MG PO TABS
25.0000 mg | ORAL_TABLET | Freq: Two times a day (BID) | ORAL | Status: DC
  Administered 2014-10-07 – 2014-10-10 (×6): 25 mg via ORAL
  Filled 2014-10-07 (×6): qty 1

## 2014-10-07 MED ORDER — SODIUM CHLORIDE 0.9 % IV SOLN: 250.0000 mL | INTRAVENOUS | Status: DC | PRN

## 2014-10-07 MED ORDER — EPLERENONE 25 MG PO TABS
25.0000 mg | ORAL_TABLET | Freq: Every day | ORAL | Status: DC
Start: 2014-10-08 — End: 2014-10-10
  Administered 2014-10-08 – 2014-10-10 (×3): 25 mg via ORAL
  Filled 2014-10-07 (×5): qty 1

## 2014-10-07 MED ORDER — RIVAROXABAN 20 MG PO TABS
20.0000 mg | ORAL_TABLET | Freq: Every day | ORAL | Status: DC
  Administered 2014-10-07 – 2014-10-09 (×3): 20 mg via ORAL
  Filled 2014-10-07 (×3): qty 1

## 2014-10-07 MED ORDER — SACUBITRIL-VALSARTAN 24-26 MG PO TABS
1.0000 | ORAL_TABLET | Freq: Two times a day (BID) | ORAL | Status: DC
  Filled 2014-10-07: qty 1

## 2014-10-07 MED ORDER — CARVEDILOL 25 MG PO TABS
25.0000 mg | ORAL_TABLET | Freq: Two times a day (BID) | ORAL | Status: DC
  Filled 2014-10-07: qty 1

## 2014-10-07 MED ORDER — POTASSIUM CHLORIDE CRYS ER 20 MEQ PO TBCR
20.0000 meq | EXTENDED_RELEASE_TABLET | Freq: Two times a day (BID) | ORAL | Status: DC
  Administered 2014-10-07 – 2014-10-10 (×6): 20 meq via ORAL
  Filled 2014-10-07 (×5): qty 1

## 2014-10-07 MED ORDER — EPLERENONE 25 MG PO TABS: 25.0000 mg | ORAL_TABLET | Freq: Every day | ORAL | Status: DC

## 2014-10-07 MED ORDER — SACUBITRIL-VALSARTAN 24-26 MG PO TABS
1.0000 | ORAL_TABLET | Freq: Two times a day (BID) | ORAL | Status: DC
  Administered 2014-10-07 – 2014-10-10 (×6): 1 via ORAL
  Filled 2014-10-07 (×8): qty 1

## 2014-10-07 MED ORDER — DOFETILIDE 500 MCG PO CAPS: 500.0000 ug | ORAL_CAPSULE | Freq: Two times a day (BID) | ORAL | Status: DC

## 2014-10-07 MED ORDER — POTASSIUM CHLORIDE CRYS ER 20 MEQ PO TBCR
20.0000 meq | EXTENDED_RELEASE_TABLET | Freq: Two times a day (BID) | ORAL | Status: DC
  Filled 2014-10-07: qty 1

## 2014-10-07 MED ORDER — DOFETILIDE 500 MCG PO CAPS
500.0000 ug | ORAL_CAPSULE | Freq: Two times a day (BID) | ORAL | Status: DC
  Administered 2014-10-07: 500 ug via ORAL
  Filled 2014-10-07: qty 1

## 2014-10-07 MED ORDER — FUROSEMIDE 40 MG PO TABS
40.0000 mg | ORAL_TABLET | Freq: Two times a day (BID) | ORAL | Status: DC
  Administered 2014-10-07 – 2014-10-08 (×2): 40 mg via ORAL
  Filled 2014-10-07: qty 1

## 2014-10-07 MED ORDER — SODIUM CHLORIDE 0.9 % IJ SOLN: 3.0000 mL | INTRAMUSCULAR | Status: DC | PRN

## 2014-10-07 MED ORDER — SODIUM CHLORIDE 0.9 % IJ SOLN
3.0000 mL | Freq: Two times a day (BID) | INTRAMUSCULAR | Status: DC
  Administered 2014-10-07 – 2014-10-10 (×6): 3 mL via INTRAVENOUS

## 2014-10-07 NOTE — Progress Notes (Addendum)
MD Balfour notified QTc 540 per EKG after 1st dose of Tikosyn, will await further orders.    Addendum 0002: after repeat EKG per MD Balfour, will hold 6am dose of Tikosyn, repeat EKG @6am  and advise day RN/MD for re-evaluation.

## 2014-10-07 NOTE — Progress Notes (Signed)
Pharmacy Review for Dofetilide (Tikosyn) Initiation  Admit Complaint: 59 y.o. male admitted 10/07/2014 with atrial fibrillation to be initiated on dofetilide.   Assessment:  Patient Exclusion Criteria: If any screening criteria checked as "Yes", then  patient  should NOT receive dofetilide until criteria item is corrected. If "Yes" please indicate correction plan.  YES  NO Patient  Exclusion Criteria Correction Plan  []  [x]  Baseline QTc interval is greater than or equal to 440 msec. IF above YES box checked dofetilide contraindicated unless patient has ICD; then may proceed if QTc 500-550 msec or with known ventricular conduction abnormalities may proceed with QTc 550-600 msec. QTc =     Patient has ICD   []  [x]  Magnesium level is less than 1.8 mEq/l : Last magnesium:  Lab Results  Component Value Date   MG 2.2 10/07/2014         []  [x]  Potassium level is less than 4 mEq/l : Last potassium:  Lab Results  Component Value Date   K 4.8 10/07/2014         []  [x]  Patient is known or suspected to have a digoxin level greater than 2 ng/ml: No results found for: DIGOXIN Patient is not on digoxin   []  [x]  Creatinine clearance less than 20 ml/min (calculated using Cockcroft-Gault, actual body weight and serum creatinine): Estimated Creatinine Clearance: 73.3 mL/min (by C-G formula based on Cr of 1.27).    []  [x]  Patient has received drugs known to prolong the QT intervals within the last 48 hours (phenothiazines, tricyclics or tetracyclic antidepressants, erythromycin, H-1 antihistamines, cisapride, fluoroquinolones, azithromycin). Drugs not listed above may have an, as yet, undetected potential to prolong the QT interval, updated information on QT prolonging agents is available at this website:QT prolonging agents   []  [x]  Patient received a dose of hydrochlorothiazide (Oretic) alone or in any combination including triamterene (Dyazide, Maxzide) in the last 48 hours.   []  [x]  Patient  received a medication known to increase dofetilide plasma concentrations prior to initial dofetilide dose:  . Trimethoprim (Primsol, Proloprim) in the last 36 hours . Verapamil (Calan, Verelan) in the last 36 hours or a sustained release dose in the last 72 hours . Megestrol (Megace) in the last 5 days  . Cimetidine (Tagamet) in the last 6 hours . Ketoconazole (Nizoral) in the last 24 hours . Itraconazole (Sporanox) in the last 48 hours  . Prochlorperazine (Compazine) in the last 36 hours    []  [x]  Patient is known to have a history of torsades de pointes; congenital or acquired long QT syndromes.   []  [x]  Patient has received a Class 1 antiarrhythmic with less than 2 half-lives since last dose. (Disopyramide, Quinidine, Procainamide, Lidocaine, Mexiletine, Flecainide, Propafenone)   []  [x]  Patient has received amiodarone therapy in the past 3 months or amiodarone level is greater than 0.3 ng/ml.    Patient has been appropriately anticoagulated with rivaroxaban.  Ordering provider was confirmed at if they are not listed on the [facility] Authorized Prescribers list.  Goal of Therapy: Follow renal function, electrolytes, potential drug interactions, and dose adjustment. Provide education and 1 week supply at discharge.  Plan:  [x]   Physician selected initial dose within range recommended for patients level of renal function - will monitor for response.  []   Physician selected initial dose outside of range recommended for patients level of renal function - will discuss if the dose should be altered at this time.   Select One Calculated CrCl  Dose q12h  [  x] > 60 ml/min 500 mcg  []  40-60 ml/min 250 mcg  []  20-40 ml/min 125 mcg   2. Follow up QTc after the first 5 doses, renal function, electrolytes (K & Mg) daily x 3     days, dose adjustment, success of initiation and facilitate 1 week discharge supply as     clinically indicated.  3. Initiate Tikosyn education video  (Call and ask for video # 116).  4. Place Enrollment Form on the chart for discharge supply of dofetilide.   5:44 PM 10/07/2014

## 2014-10-07 NOTE — Progress Notes (Signed)
Pt's Qtc prior to 1st dose of Tikosyn was 472 per 12 lead EKG. Wilburt Finlay paged and made aware; gave order to give scheduled dose of Tikosyn. Medication given per order. Nightshift RN, Victorino Dike, made aware. 12 lead EKG to follow per order at 10pm tonight.

## 2014-10-07 NOTE — H&P (Signed)
History and Physical   Patient ID: Terry [name] MRN: [id number], DOB/AGE: 06/11/1955 59 y.o. Date of Encounter: 10/07/2014  Primary Physician: [name], MD Primary Cardiologist: Dr. [name]  Chief Complaint:  Atrial fibrillation, Tikosyn loading  HPI: Terry [name] is a 59 y.o. male with a history of nonischemic cardiomyopathy, chronic systolic CHF, hypertension and persistent A. fib. He has a Engineer, water. Jude ICD, implanted June 2016. He was evaluated by Dr. [name] 10/01/2014. Plan at that time was for Tikosyn initiation followed by cardioversion if he does not spontaneously convert on the medication. He is chronically anticoagulated with Xarelto and is compliant with this. He is here today for admission.  He remains in atrial fibrillation currently.  No new complaints.  Past Medical History  Diagnosis Date  . GERD (gastroesophageal reflux disease)   . H/O hiatal hernia   . Aortic insufficiency     mild   . Ejection fraction < 50%     by echo 08/25/11   . H/O hematuria   . 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 Hypertension   . Aortic root dilatation   . AICD (automatic cardioverter/defibrillator) present   . Complication of anesthesia     hx of irregular beat after anesthesia in ~ 1990's  . Chronic systolic CHF (congestive heart failure), NYHA class 1   . Atrial fibrillation     "off and on since 2013" (08/05/2014)  . Dysrhythmia   . Rheumatoid arthritis dx'd 1995  . Anemia     "S/P knee & hip OR"  . History of blood transfusion 2013; 2014    S/P knee and hip OR  . 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 Nasal septum surgery  ~ 1975  . Urethral stricture dilatation  "several times"  . Foot neuroma surgery Right     related to benign tumor   . Colonoscopy  2010; 2015    "polyps; no polyps"  . Total hip arthroplasty  09/08/2011    Procedure: TOTAL HIP ARTHROPLASTY  ANTERIOR APPROACH;  Surgeon: [name], MD;  Location: [facility]S;  Service: Orthopedics;  Laterality: Right;  Right total hip replacement, left knee steroid injection  . Total knee arthroplasty Left 11/29/2012    Procedure: LEFT TOTAL KNEE ARTHROPLASTY;  Surgeon: [name], MD;  Location: [facility];  Service: Orthopedics;  Laterality: Left;  FEMORAL NERVE BLOCK IN HOLDING AREA LEFT LEG  . Cardiac catheterization  ~ 2004    normal  . Cardioversion N/A 08/21/2013    Procedure: CARDIOVERSION;  Surgeon: Quintella Reichert, MD;  Location: [facility] ENDOSCOPY;  Service: Cardiovascular;  Laterality: N/A;  . Cardiac defibrillator placement  08/05/2014    St. Jude  . Joint replacement    . Tonsillectomy    . Urethral fistula repair  ~ 1996  . Ep implantable device N/A 08/05/2014    Procedure: ICD Implant;  Surgeon: [name], MD;  Location: [facility] INVASIVE CV LAB;  Service: Cardiovascular;  Laterality: N/A;  . Cardiac catheterization N/A 09/07/2014    Procedure: Left Heart Cath and Coronary Angiography;  Surgeon: Laurey Morale, MD;  Location: [facility] INVASIVE CV LAB;  Service: Cardiovascular;  Laterality: N/A;     I have reviewed the patient's current medications. Prior to Admission medications   Medication Sig Start Date End Date Taking? Authorizing Provider  carvedilol (  COREG) 25 MG tablet TAKE 1 TABLET TWICE A DAY 05/12/14   Quintella Reichert, MD  eplerenone (INSPRA) 25 MG tablet Take 1 tablet (25 mg total) by mouth daily. 09/09/14   Quintella Reichert, MD  furosemide (LASIX) 40 MG tablet TAKE 1 TABLET TWICE A DAY 05/12/14   Quintella Reichert, MD  potassium chloride SA (K-DUR,KLOR-CON) 20 MEQ tablet TAKE 1 TABLET TWICE A DAY 05/12/14   Quintella Reichert, MD  rivaroxaban (XARELTO) 20 MG TABS tablet Take 1 tablet (20 mg total) by mouth daily with supper. Resume on 08/07/14 09/28/14   Quintella Reichert, MD  sacubitril-valsartan (ENTRESTO) 24-26 MG Take 1 tablet by mouth 2 (two) times daily. 08/07/14   Laurey Morale,  MD   Scheduled Meds: Continuous Infusions: PRN Meds:.  Allergies:  Allergies  Allergen Reactions  . Sulfa Drugs Cross Reactors Hives and Other (See Comments)    thrush    Social History   Social History  . Marital Status: Married    Spouse Name: N/A  . Number of Children: N/A  . Years of Education: N/A   Occupational History  . Not on file.   Social History Main Topics  . Smoking status: Never Smoker   . Smokeless tobacco: Never Used  . Alcohol Use: 0.6 oz/week    1 Cans of beer per week  . Drug Use: No  . Sexual Activity: Yes   Other Topics Concern  . Not on file   Social History Narrative    Family History  Problem Relation Age of Onset  . Heart disease Father   . Heart attack Father 60  . Heart failure Brother    Family Status  Relation Status Death Age  . Father Deceased   . Brother Alive   . Mother Deceased     Review of Systems:   Full 14-point review of systems otherwise negative except as noted above.  Physical Exam: There were no vitals taken for this visit. General: Well developed, well nourished,male in no acute distress. Head: Normocephalic, atraumatic, sclera non-icteric, no xanthomas, nares are without discharge. Dentition:  Neck: No carotid bruits. JVD not elevated. No thyromegally Lungs: Good expansion bilaterally. without wheezes or rhonchi.  Heart: Irregular rate and rhythm with S1 S2.  No S3 or S4.  No murmur, no rubs, or gallops appreciated. Abdomen: Soft, non-tender, non-distended with normoactive bowel sounds. No hepatomegaly. No rebound/guarding. No obvious abdominal masses. Msk:  Strength and tone appear normal for age. No joint deformities or effusions, no spine or costo-vertebral angle tenderness. Extremities: No clubbing or cyanosis. No edema.  Distal pedal pulses are 2+ in 4 extrem Neuro: Alert and oriented X 3. Moves all extremities spontaneously. No focal deficits noted. Psych:  Responds to questions appropriately with a  normal affect. Skin: No rashes or lesions noted  Labs:   Lab Results  Component Value Date   WBC 7.8 09/07/2014   HGB 13.0 09/07/2014   HCT 37.7* 09/07/2014   MCV 87.3 09/07/2014   PLT 162 09/07/2014   No results for input(s): INR in the last 72 hours.  Recent Labs Lab 10/07/14 1023  NA 132*  K 4.8  CL 100  CO2 26  BUN 26*  CREATININE 1.27  CALCIUM 9.3  GLUCOSE 82   No results for input(s): CKTOTAL, CKMB, TROPONINI in the last 72 hours. No results for input(s): TROPIPOC in the last 72 hours. No results found for: CHOL, HDL, LDLCALC, TRIG No results found for: BNP  No results found for: PROBNP No results found for: DDIMER  Radiology/Studies: No results found.   ECG: Atrial fibrillation, QTc acceptable.   ASSESSMENT AND PLAN:   See attending note below.  Terry [name] 10/07/2014 3:15 PM Beeper [phone]  Patient seen with PA, agree with the above note.   1. Chronic systolic HF: Nonischemic cardiomyopathy x years. NYHA class II. He has a Secondary school teacher ICD and is not a candidate for CRT due to narrow QRS. 7/16 LHC showed no significant CAD.  - Continue Lasix 40 mg bid - Continue current Coreg.  - Continue Entresto 24/26 mg bid  - Gynecomastia resolved off spironolactone. He is doing fine on eplerenone.  - Given fall in EF corresponding to onset of atrial fibrillation, think it would be reasonable to get him out of atrial fibrillation (see below).  - Encouraged daily weights, fluid and salt restriction. 2. Persistent atrial fibrillation: Since at least 1/16. He was cardioverted in 7/15. He feels less energetic in atrial fibrillation. Fall in EF also corresponds to going back into atrial fibrillation. Do not think that he would hold NSR without anti-arrhythmic.K and Mg ok today, QTc acceptable. - Plan for dofetilide initiation today followed by DCCV on Friday if he remains in atrial fibrillation on dofetilide.His last in-hospital dose of  dofetilide will be Saturday morning. - Continue Xarelto 20 mg daily  Terry [name] 10/07/2014

## 2014-10-07 NOTE — Telephone Encounter (Signed)
Discussed lab results with pt.  He is aware to report to the hospital for Tikosyn initiation.

## 2014-10-07 NOTE — Telephone Encounter (Signed)
New message      Pt saw Kennon Rounds this am-----want lab results

## 2014-10-07 NOTE — Progress Notes (Signed)
    Primary Cardiologist: Armanda Magic, MD  HPI: Terry [name] is a 59 y.o. male patient of Dr. Mayford Knife with a history of chronic systolic CHF due to nonischemic cardiomyopathy,  HTN, and persistent AF s/p St Jude ICD 08/05/14,. He has had notable LV EF decline in the past six months despite guideline directed therapy and was referred to CHF clinic. Pt was cardioverted in 7/15 but has been back in afib since at least 1/16.  He feels less energetic in atrial fibrillation and his fall in EF also corresponds to going back into atrial fibrillation.  Dr. [name] did not think that he would hold NSR without anti-arrhythmic so plan was made to start Tikosyn.    Pt accompanied with his wife today.  Both educated on potential side effects of Tikosyn including QTc prolongation.  They are aware of the importance of compliance.  Reviewed medication list.  He is currently not taking any QTc prolongating or contraindicated medications.  He is anticoagulated with Xarelto and not missed any doses in the past 4 weeks.  He has never tried any other antiarrhythmic therapies.    ECG reviewed by Dr. Johney Frame: atrial fibrillation with vent rate of 84 bpm.  QTc 451 msec      Current Outpatient Prescriptions  Medication Sig Dispense Refill  . carvedilol (COREG) 25 MG tablet TAKE 1 TABLET TWICE A DAY 180 tablet 1  . eplerenone (INSPRA) 25 MG tablet Take 1 tablet (25 mg total) by mouth daily. 30 tablet 6  . furosemide (LASIX) 40 MG tablet TAKE 1 TABLET TWICE A DAY 180 tablet 1  . potassium chloride SA (K-DUR,KLOR-CON) 20 MEQ tablet TAKE 1 TABLET TWICE A DAY 180 tablet 1  . rivaroxaban (XARELTO) 20 MG TABS tablet Take 1 tablet (20 mg total) by mouth daily with supper. Resume on 08/07/14 30 tablet 11  . sacubitril-valsartan (ENTRESTO) 24-26 MG Take 1 tablet by mouth 2 (two) times daily. 60 tablet 3   No current facility-administered medications for this visit.    Allergies  Allergen Reactions  . Sulfa Drugs Cross Reactors  Hives and Other (See Comments)    thrush   ASSESSMENT & PLAN:  1. Atrial Fibrillation: Reviewed pt's labs.  K and Mg acceptable for starting Tikosyn.  QTc but pt does have an ICD.  CrCl- 86 mL/min.  Okay to start Tikosyn BID.  Pt aware to report to hospital.     Evie Lacks 10/07/2014

## 2014-10-08 DIAGNOSIS — I480 Paroxysmal atrial fibrillation: Secondary | ICD-10-CM

## 2014-10-08 LAB — BASIC METABOLIC PANEL
ANION GAP: 9 (ref 5–15)
BUN: 23 mg/dL — AB (ref 6–20)
CALCIUM: 9.2 mg/dL (ref 8.9–10.3)
CO2: 28 mmol/L (ref 22–32)
Chloride: 99 mmol/L — ABNORMAL LOW (ref 101–111)
Creatinine, Ser: 1.33 mg/dL — ABNORMAL HIGH (ref 0.61–1.24)
GFR calc Af Amer: >60 mL/min (ref >60)
GFR, EST NON AFRICAN AMERICAN: 57 mL/min — AB (ref >60)
GLUCOSE: 118 mg/dL — AB (ref 65–99)
Potassium: 4.9 mmol/L (ref 3.5–5.1)
SODIUM: 136 mmol/L (ref 135–145)

## 2014-10-08 LAB — MAGNESIUM: MAGNESIUM: 2.1 mg/dL (ref 1.7–2.4)

## 2014-10-08 MED ORDER — FUROSEMIDE 40 MG PO TABS
40.0000 mg | ORAL_TABLET | Freq: Every day | ORAL | Status: DC
  Administered 2014-10-09 – 2014-10-10 (×2): 40 mg via ORAL
  Filled 2014-10-08 (×2): qty 1

## 2014-10-08 MED ORDER — DOFETILIDE 250 MCG PO CAPS
250.0000 ug | ORAL_CAPSULE | Freq: Two times a day (BID) | ORAL | Status: DC
  Administered 2014-10-08 – 2014-10-10 (×5): 250 ug via ORAL
  Filled 2014-10-08 (×5): qty 1

## 2014-10-08 NOTE — Care Management Note (Addendum)
Case Management Note  Patient Details  Name: Terry [name] MRN: [id number] Date of Birth: [birth year]  Subjective/Objective:  Pt admitted for Tikosyn Load.                  Action/Plan: Benefits check in process for Tikosyn. Will make pt aware of cost once completed. Pt will need a Rx for 7 day supply and main pharmacy will assist with 7 day supply. Pt will need original Rx for Tikosyn as well. CM will continue to monitor for disposition needs.    Expected Discharge Date:                  Expected Discharge Plan:  Home/Self Care  In-House Referral:  NA  Discharge planning Services  CM Consult, Medication Assistance  Post Acute Care Choice:  NA Choice offered to:  NA  DME Arranged:  N/A DME Agency:  NA  HH Arranged:  NA HH Agency:  NA  Status of Service:  Completed, signed off  Medicare Important Message Given:    Date Medicare IM Given:    Medicare IM give by:    Date Additional Medicare IM Given:    Additional Medicare Important Message give by:     If discussed at Long Length of Stay Meetings, dates discussed:    Additional Comments: 1046 10-08-14 Tomi Bamberger, RN, BSN [phone] CM did speak with pt in regards to co pay. Pt will not have a co pay due to he has satisfied his out of pocket expenses. Pt can also use mail order for 90 day supply with no co pay as well.  Pt uses [facility] inside of [employer]. CM did call and they do not dispense Tikosyn. [facility] [address] and medication is available- brand and generic. Charge RN to assist with Tikosyn on day of d/c. Pt has concerns with cost of medication after December. Pt would like to use the generic of Tikosyn once d/c. He will need a Rx for 7 day supply no refills, Rx for 30 day to get at [facility] [address] until mail order arrives and 90 day RX e-scribed to [facility]. Thanks No further needs from CM at this time.    Gala Lewandowsky, RN 10/08/2014, 9:08 AM

## 2014-10-08 NOTE — Progress Notes (Signed)
UR Completed Lenor Provencher Graves-Bigelow, RN,BSN [phone]  

## 2014-10-08 NOTE — Progress Notes (Signed)
Patient ID: Terry [name], male   DOB: [birth year], 59 y.o.   MRN: [id number]   SUBJECTIVE: No complaints this morning.  Converted to NSR with Tikosyn dose last night.  QTc prolonged at 475 msec in NSR this morning.    Scheduled Meds: . carvedilol  25 mg Oral BID  . dofetilide  250 mcg Oral BID  . eplerenone  25 mg Oral Daily  . furosemide  40 mg Oral BID  . potassium chloride SA  20 mEq Oral BID  . rivaroxaban  20 mg Oral Q supper  . sacubitril-valsartan  1 tablet Oral BID  . sodium chloride  3 mL Intravenous Q12H   Continuous Infusions:  PRN Meds:.sodium chloride, sodium chloride    Filed Vitals:   10/08/14 0553 10/08/14 0657 10/08/14 0839 10/08/14 0840  BP: 96/67 94/69 89/58  92/61  Pulse: 71  72 78  Temp: 98.1 F (36.7 C)     TempSrc: Oral     Resp: 16     Height:      Weight: 208 lb 1.6 oz (94.394 kg)     SpO2: 95%  99% 94%    Intake/Output Summary (Last 24 hours) at 10/08/14 0910 Last data filed at 10/08/14 0827  Gross per 24 hour  Intake    480 ml  Output      0 ml  Net    480 ml    LABS: Basic Metabolic Panel:  Recent Labs  10/10/14 1023 10/08/14 0649  NA 132* 136  K 4.8 4.9  CL 100 99*  CO2 26 28  GLUCOSE 82 118*  BUN 26* 23*  CREATININE 1.27 1.33*  CALCIUM 9.3 9.2  MG 2.2 2.1   Liver Function Tests: No results for input(s): AST, ALT, ALKPHOS, BILITOT, PROT, ALBUMIN in the last 72 hours. No results for input(s): LIPASE, AMYLASE in the last 72 hours. CBC: No results for input(s): WBC, NEUTROABS, HGB, HCT, MCV, PLT in the last 72 hours. Cardiac Enzymes: No results for input(s): CKTOTAL, CKMB, CKMBINDEX, TROPONINI in the last 72 hours. BNP: Invalid input(s): POCBNP D-Dimer: No results for input(s): DDIMER in the last 72 hours. Hemoglobin A1C: No results for input(s): HGBA1C in the last 72 hours. Fasting Lipid Panel: No results for input(s): CHOL, HDL, LDLCALC, TRIG, CHOLHDL, LDLDIRECT in the last 72 hours. Thyroid Function Tests: No results  for input(s): TSH, T4TOTAL, T3FREE, THYROIDAB in the last 72 hours.  Invalid input(s): FREET3 Anemia Panel: No results for input(s): VITAMINB12, FOLATE, FERRITIN, TIBC, IRON, RETICCTPCT in the last 72 hours.  RADIOLOGY: No results found.  PHYSICAL EXAM General: NAD Neck: No JVD, no thyromegaly or thyroid nodule.  Lungs: Clear to auscultation bilaterally with normal respiratory effort. CV: Nondisplaced PMI.  Heart regular S1/S2, no S3/S4, no murmur.  No peripheral edema.  No carotid bruit.  Normal pedal pulses.  Abdomen: Soft, nontender, no hepatosplenomegaly, no distention.  Neurologic: Alert and oriented x 3.  Psych: Normal affect. Extremities: No clubbing or cyanosis.   TELEMETRY: Reviewed telemetry pt in NSR  ASSESSMENT AND PLAN: 1. Chronic systolic HF: Nonischemic cardiomyopathy x years. NYHA class II. He has a 10/10/14 ICD and is not a candidate for CRT due to narrow QRS. 7/16 LHC showed no significant CAD.  - I think we can decrease lasix to once daily.  - Continue current Coreg.  - Continue Entresto 24/26 mg bid  - Gynecomastia resolved off spironolactone. He is doing fine on eplerenone.  - Given fall in EF corresponding to  onset of atrial fibrillation, think it would be reasonable to get him out of atrial fibrillation (see below).  - Encouraged daily weights, fluid and salt restriction. 2. Atrial fibrillation: Persistent since at least 1/16.He feels less energetic in atrial fibrillation. Fall in EF also corresponds to going back into atrial fibrillation. Do not think that he would hold NSR without anti-arrhythmic.He started dofetilide and went back into NSR. QTc prolonged to 475 msec today.  - Continue Xarelto.  - Decrease dofetilide to 250 mcg bid.  - Continue to monitor QTc.   Marca Ancona 10/08/2014

## 2014-10-08 NOTE — Progress Notes (Signed)
Dr. [name] notified of patient's prolonged QTC last pm at 2300 and MD's notification to hold am dose until rounding MD.  Patient converted to SR around 1201 am per CCMD.  Morning EKG QTC manual calculation 490.  New orders received per Dr. [name].  Primary Nurse Glade Lloyd notified.  Patient updated.  Terry [name]

## 2014-10-09 LAB — BASIC METABOLIC PANEL
Anion gap: 8 (ref 5–15)
BUN: 20 mg/dL (ref 6–20)
CALCIUM: 8.8 mg/dL — AB (ref 8.9–10.3)
CHLORIDE: 99 mmol/L — AB (ref 101–111)
CO2: 26 mmol/L (ref 22–32)
CREATININE: 1.29 mg/dL — AB (ref 0.61–1.24)
GFR calc Af Amer: >60 mL/min (ref >60)
GFR calc non Af Amer: 59 mL/min — ABNORMAL LOW (ref >60)
GLUCOSE: 86 mg/dL (ref 65–99)
Potassium: 4.7 mmol/L (ref 3.5–5.1)
Sodium: 133 mmol/L — ABNORMAL LOW (ref 135–145)

## 2014-10-09 LAB — MAGNESIUM: Magnesium: 2.1 mg/dL (ref 1.7–2.4)

## 2014-10-09 NOTE — Progress Notes (Signed)
Patient ID: Terry [name], male   DOB: 1955-07-30, 59 y.o.   MRN: [id number]   SUBJECTIVE: No complaints this morning.  Converted to NSR with Tikosyn this admission.  QTc at 467 msec in NSR this morning.    Scheduled Meds: . carvedilol  25 mg Oral BID  . dofetilide  250 mcg Oral BID  . eplerenone  25 mg Oral Daily  . furosemide  40 mg Oral Daily  . potassium chloride SA  20 mEq Oral BID  . rivaroxaban  20 mg Oral Q supper  . sacubitril-valsartan  1 tablet Oral BID  . sodium chloride  3 mL Intravenous Q12H   Continuous Infusions:  PRN Meds:.sodium chloride, sodium chloride    Filed Vitals:   10/08/14 0840 10/08/14 1510 10/08/14 2049 10/09/14 0500  BP: 92/61 98/67 91/60  94/67  Pulse: 78 62 62 64  Temp:  98.3 F (36.8 C) 98.2 F (36.8 C) 98.2 F (36.8 C)  TempSrc:  Oral Oral Oral  Resp:  16    Height:      Weight:    203 lb 9.6 oz (92.352 kg)  SpO2: 94% 99% 97% 96%    Intake/Output Summary (Last 24 hours) at 10/09/14 10/11/14 Last data filed at 10/08/14 0827  Gross per 24 hour  Intake    240 ml  Output      0 ml  Net    240 ml    LABS: Basic Metabolic Panel:  Recent Labs  10/10/14 0649 10/09/14 0412  NA 136 133*  K 4.9 4.7  CL 99* 99*  CO2 28 26  GLUCOSE 118* 86  BUN 23* 20  CREATININE 1.33* 1.29*  CALCIUM 9.2 8.8*  MG 2.1 2.1   Liver Function Tests: No results for input(s): AST, ALT, ALKPHOS, BILITOT, PROT, ALBUMIN in the last 72 hours. No results for input(s): LIPASE, AMYLASE in the last 72 hours. CBC: No results for input(s): WBC, NEUTROABS, HGB, HCT, MCV, PLT in the last 72 hours. Cardiac Enzymes: No results for input(s): CKTOTAL, CKMB, CKMBINDEX, TROPONINI in the last 72 hours. BNP: Invalid input(s): POCBNP D-Dimer: No results for input(s): DDIMER in the last 72 hours. Hemoglobin A1C: No results for input(s): HGBA1C in the last 72 hours. Fasting Lipid Panel: No results for input(s): CHOL, HDL, LDLCALC, TRIG, CHOLHDL, LDLDIRECT in the last 72  hours. Thyroid Function Tests: No results for input(s): TSH, T4TOTAL, T3FREE, THYROIDAB in the last 72 hours.  Invalid input(s): FREET3 Anemia Panel: No results for input(s): VITAMINB12, FOLATE, FERRITIN, TIBC, IRON, RETICCTPCT in the last 72 hours.  RADIOLOGY: No results found.  PHYSICAL EXAM General: NAD Neck: No JVD, no thyromegaly or thyroid nodule.  Lungs: Clear to auscultation bilaterally with normal respiratory effort. CV: Nondisplaced PMI.  Heart regular S1/S2, no S3/S4, no murmur.  No peripheral edema.  No carotid bruit.  Normal pedal pulses.  Abdomen: Soft, nontender, no hepatosplenomegaly, no distention.  Neurologic: Alert and oriented x 3.  Psych: Normal affect. Extremities: No clubbing or cyanosis.   TELEMETRY: Reviewed telemetry pt in NSR  ASSESSMENT AND PLAN: 1. Chronic systolic HF: Nonischemic cardiomyopathy x years. NYHA class II. He has a 10/11/14 ICD and is not a candidate for CRT due to narrow QRS. 7/16 LHC showed no significant CAD.  - Can keep Lasix dose at once daily.  - Continue current Coreg.  - Continue Entresto 24/26 mg bid  - Gynecomastia resolved off spironolactone. He is doing fine on eplerenone.  - Given fall in EF  corresponding to onset of atrial fibrillation, think it would be reasonable to get him out of atrial fibrillation (see below).  - Encouraged daily weights, fluid and salt restriction. 2. Atrial fibrillation: Persistent since at least 1/16.He feels less energetic in atrial fibrillation. Fall in EF also corresponds to going back into atrial fibrillation. Do not think that he would hold NSR without anti-arrhythmic.He started dofetilide and went back into NSR. QTc 467 msec this morning. - Continue Xarelto.  - Continue dofetilide at 250 mcg bid.  - Continue to monitor QTc.  - Last dose in hospital will be tomorrow morning, then can go home.   Marca Ancona 10/09/2014

## 2014-10-10 ENCOUNTER — Other Ambulatory Visit: Payer: Self-pay

## 2014-10-10 DIAGNOSIS — I10 Essential (primary) hypertension: Secondary | ICD-10-CM

## 2014-10-10 DIAGNOSIS — I4891 Unspecified atrial fibrillation: Secondary | ICD-10-CM

## 2014-10-10 LAB — BASIC METABOLIC PANEL
ANION GAP: 7 (ref 5–15)
BUN: 23 mg/dL — AB (ref 6–20)
CALCIUM: 9 mg/dL (ref 8.9–10.3)
CO2: 25 mmol/L (ref 22–32)
Chloride: 101 mmol/L (ref 101–111)
Creatinine, Ser: 1.25 mg/dL — ABNORMAL HIGH (ref 0.61–1.24)
GFR calc Af Amer: >60 mL/min (ref >60)
GLUCOSE: 93 mg/dL (ref 65–99)
Potassium: 4.5 mmol/L (ref 3.5–5.1)
SODIUM: 133 mmol/L — AB (ref 135–145)

## 2014-10-10 LAB — MAGNESIUM: MAGNESIUM: 2.1 mg/dL (ref 1.7–2.4)

## 2014-10-10 MED ORDER — DOFETILIDE 250 MCG PO CAPS: 250.0000 ug | ORAL_CAPSULE | Freq: Two times a day (BID) | ORAL | Status: DC

## 2014-10-10 NOTE — Progress Notes (Signed)
SUBJECTIVE: No chest pain/palps/SOB. QTc 485 ms this morning, 467 yesterday.     Intake/Output Summary (Last 24 hours) at 10/10/14 1232 Last data filed at 10/09/14 2100  Gross per 24 hour  Intake    360 ml  Output      0 ml  Net    360 ml    Current Facility-Administered Medications  Medication Dose Route Frequency Provider Last Rate Last Dose  . 0.9 %  sodium chloride infusion  250 mL Intravenous PRN Dwana Melena, PA-C      . carvedilol (COREG) tablet 25 mg  25 mg Oral BID Laurey Morale, MD   25 mg at 10/10/14 7672  . dofetilide (TIKOSYN) capsule 250 mcg  250 mcg Oral BID Laurey Morale, MD   250 mcg at 10/10/14 0947  . eplerenone (INSPRA) tablet 25 mg  25 mg Oral Daily Laurey Morale, MD   25 mg at 10/10/14 0962  . furosemide (LASIX) tablet 40 mg  40 mg Oral Daily Laurey Morale, MD   40 mg at 10/10/14 0916  . potassium chloride SA (K-DUR,KLOR-CON) CR tablet 20 mEq  20 mEq Oral BID Laurey Morale, MD   20 mEq at 10/10/14 8366  . rivaroxaban (XARELTO) tablet 20 mg  20 mg Oral Q supper Dwana Melena, PA-C   20 mg at 10/09/14 1754  . sacubitril-valsartan (ENTRESTO) 24-26 mg per tablet  1 tablet Oral BID Laurey Morale, MD   1 tablet at 10/10/14 [phone]  . sodium chloride 0.9 % injection 3 mL  3 mL Intravenous Q12H Dwana Melena, PA-C   3 mL at 10/10/14 6546  . sodium chloride 0.9 % injection 3 mL  3 mL Intravenous PRN Dwana Melena, PA-C        Filed Vitals:   10/09/14 2047 10/10/14 0400 10/10/14 0820 10/10/14 0916  BP: 93/61 93/69 92/68  99/65  Pulse: 63 65    Temp: 98.4 F (36.9 C) 98 F (36.7 C)    TempSrc: Oral Oral    Resp:      Height:      Weight:      SpO2: 96% 97%      PHYSICAL EXAM General: NAD Neck: No JVD, no thyromegaly or thyroid nodule.  Lungs: Clear to auscultation bilaterally with normal respiratory effort. CV: Nondisplaced PMI. Heart regular S1/S2, no S3/S4, no murmur. No peripheral edema. Abdomen: Soft, nontender, no distention.   Neurologic: Alert and oriented x 3.  Psych: Normal affect. Extremities: No clubbing or cyanosis.   TELEMETRY: Reviewed telemetry pt in NSR with isolated PVC's, no NSVT/VT.  LABS: Basic Metabolic Panel:  Recent Labs  0412 10/10/14 0417  NA 133* 133*  K 4.7 4.5  CL 99* 101  CO2 26 25  GLUCOSE 86 93  BUN 20 23*  CREATININE 1.29* 1.25*  CALCIUM 8.8* 9.0  MG 2.1 2.1   Liver Function Tests: No results for input(s): AST, ALT, ALKPHOS, BILITOT, PROT, ALBUMIN in the last 72 hours. No results for input(s): LIPASE, AMYLASE in the last 72 hours. CBC: No results for input(s): WBC, NEUTROABS, HGB, HCT, MCV, PLT in the last 72 hours. Cardiac Enzymes: No results for input(s): CKTOTAL, CKMB, CKMBINDEX, TROPONINI in the last 72 hours. BNP: Invalid input(s): POCBNP D-Dimer: No results for input(s): DDIMER in the last 72 hours. Hemoglobin A1C: No results for input(s): HGBA1C in the last 72 hours. Fasting Lipid Panel: No results for input(s): CHOL,  HDL, LDLCALC, TRIG, CHOLHDL, LDLDIRECT in the last 72 hours. Thyroid Function Tests: No results for input(s): TSH, T4TOTAL, T3FREE, THYROIDAB in the last 72 hours.  Invalid input(s): FREET3 Anemia Panel: No results for input(s): VITAMINB12, FOLATE, FERRITIN, TIBC, IRON, RETICCTPCT in the last 72 hours.  RADIOLOGY: No results found.    ASSESSMENT AND PLAN: 1. Chronic systolic HF: Nonischemic cardiomyopathy x years. NYHA class II. He has a Secondary school teacher ICD and is not a candidate for CRT due to narrow QRS. 7/16 LHC showed no significant CAD.  - Can keep Lasix dose at once daily.  - Continue current Coreg.  - Continue Entresto 24/26 mg bid  - Gynecomastia resolved off spironolactone. He is doing fine on eplerenone.  - Given fall in EF corresponding to onset of atrial fibrillation, think it would be reasonable to get him out of atrial fibrillation (see below).  - Encouraged daily weights, fluid and salt restriction.  2.  Atrial fibrillation: Persistent since at least 1/16.He feels less energetic in atrial fibrillation. Fall in EF also corresponds to going back into atrial fibrillation. Do not think that he would hold NSR without anti-arrhythmic.He started dofetilide and went back into NSR. QTc 485 msec this morning. - Continue Xarelto.  - Continue dofetilide at 250 mcg bid.  - Continue to monitor QTc.  -Received last dose this morning  Dispo: d/c to home.  Prentice Docker, M.D., F.A.C.C.

## 2014-10-10 NOTE — Discharge Summary (Signed)
Discharge Summary   Patient ID: Terry [name],  MRN: [id number], DOB/AGE: April 12, 1955 59 y.o.  Admit date: 10/07/2014 Discharge date: 10/10/2014  Primary Care Provider: [name] Primary Cardiologist: Dr. Mayford Knife / Dr. [name]  Discharge Diagnoses Principal Problem:   Atrial fibrillation Active Problems:   Nonischemic dilated cardiomyopathy   Hypertension   Chronic systolic heart failure   Allergies Allergies  Allergen Reactions  . Spironolactone     Gynecomastia  . Sulfa Drugs Cross Reactors Hives and Other (See Comments)    thrush    Hospital Course  The patient is a 59 year old male with PMH of nonischemic cardiomyopathy, chronic systolic heart failure, hypertension, persistent atrial fibrillation on Xarelto. 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 He was evaluated by Dr. [name] in August 2016, plans at the time was tikosyn initiation followed by cardioversion if he does not spontaneously convert on the medication.  He presented to [facility] on 10/07/2014 for planned tikosyn loading. Per record, he has been in persistent atrial fibrillation since at least January 2016, he was previously cardioverted in September 2015. Unfortunate, patient has significant symptom for the atrial fibrillation complaining of chronic fatigue. Initial plan was for the patient to undergo DC cardioversion on 8/26 if he remains in atrial fibrillation despite tikosyn. Fortunately patient did convert to normal sinus rhythm on 8/24 after initial dosing. His QTc did prolong to 475 ms on 8/25, his tikosyn dose was decreased to 250 g twice a day. QTc did improve to 467 on 8/26.  He was seen in the morning of 10/10/2014, his QTC is 485 msec, slightly longer than the previous day. He is deemed stable for discharge from cardiology perspective to continue on the current dose of 250 mcg Tikosyn BID. Per Dr. Purvis Sheffield, patient will need 1 week EKG in the office and close outpatient follow-up to  monitor his QTc interval. I will arrange 7 day TOC followup on the same day of EKG to monitor if there is any further prolongation of his QTc. Prior to discharge, patient has been given 7 days supply of tikosyn, I have also send a 30 day supply [facility], and six-month supply to his mail-in pharmacy. I have emphasized with the patient the need to be compliant with Tikosyn.   Discharge Vitals Blood pressure 99/65, pulse 65, temperature 98 F (36.7 C), temperature source Oral, resp. rate 16, height 5\' 11"  (1.803 m), weight 203 lb 9.6 oz (92.352 kg), SpO2 97 %.  Filed Weights   10/07/14 1500 10/08/14 0553 10/09/14 0500  Weight: 206 lb 12.7 oz (93.8 kg) 208 lb 1.6 oz (94.394 kg) 203 lb 9.6 oz (92.352 kg)    Labs  Basic Metabolic Panel  Recent Labs  10/09/14 0412 10/10/14 0417  NA 133* 133*  K 4.7 4.5  CL 99* 101  CO2 26 25  GLUCOSE 86 93  BUN 20 23*  CREATININE 1.29* 1.25*  CALCIUM 8.8* 9.0  MG 2.1 2.1    Disposition  Pt is being discharged home today in good condition.  Follow-up Plans & Appointments      Follow-up Information    Follow up with 10/12/14, MD.   Specialty:  Cardiology   Why:  Office will contact you to arrange followup, please give Quintella Reichert a call if you do not hear from [place]. You will need an EKG in 1 week in the office   Contact information:   [address] [place]  [phone]       Follow up with Lewayne Bunting, MD On 11/03/2014.   Specialty:  Cardiology   Why:  11:15AM   Contact information:   [address] [place] [address] [phone]       Discharge Medications    Medication List    TAKE these medications        carvedilol 25 MG tablet  Commonly known as:  COREG  TAKE 1 TABLET TWICE A DAY     dofetilide 250 MCG capsule  Commonly known as:  TIKOSYN  Take 1 capsule (250 mcg total) by mouth 2 (two) times daily.     eplerenone 25 MG tablet  Commonly known as:  INSPRA  Take  1 tablet (25 mg total) by mouth daily.     furosemide 40 MG tablet  Commonly known as:  LASIX  TAKE 1 TABLET TWICE A DAY     potassium chloride SA 20 MEQ tablet  Commonly known as:  K-DUR,KLOR-CON  TAKE 1 TABLET TWICE A DAY     rivaroxaban 20 MG Tabs tablet  Commonly known as:  XARELTO  Take 1 tablet (20 mg total) by mouth daily with supper. Resume on 08/07/14     sacubitril-valsartan 24-26 MG  Commonly known as:  ENTRESTO  Take 1 tablet by mouth 2 (two) times daily.        Duration of Discharge Encounter   Greater than 30 minutes including physician time.  Ramond Dial PA-C Pager: [phone] 10/10/2014, 1:02 PM

## 2014-10-10 NOTE — Progress Notes (Signed)
Patient Discharge Summary gone over with patient and wife at bedside. All patient questions answered to their satisfaction. Education materials given on Atrial Fibrillation, HF, Tikosyn. Tikosyn picked up by this RN from [employer] and hand delivered to patient for discharge. Patient discharged home with wife. Patient will make follow up appointments on Monday he states.

## 2014-10-12 ENCOUNTER — Telehealth: Payer: Self-pay | Admitting: Physician Assistant

## 2014-10-12 NOTE — Telephone Encounter (Signed)
TCM 9/6 @ 10am w/ Scott per Hao/staff message; ah

## 2014-10-12 NOTE — Telephone Encounter (Signed)
Patient contacted regarding discharge from [facility] on 10/10/14  Patient understands to follow up with provider Tereso Newcomer, PAc on 10/20/14  at 10 AM at [facility] Patient understands discharge instructions? yes Patient understands medications and regiment? yes Patient understands to bring all medications to this visit? yes

## 2014-10-14 ENCOUNTER — Other Ambulatory Visit: Payer: Self-pay | Admitting: *Deleted

## 2014-10-14 MED ORDER — CARVEDILOL 25 MG PO TABS: 25.0000 mg | ORAL_TABLET | Freq: Two times a day (BID) | ORAL | Status: DC

## 2014-10-14 MED ORDER — POTASSIUM CHLORIDE CRYS ER 20 MEQ PO TBCR: 20.0000 meq | EXTENDED_RELEASE_TABLET | Freq: Two times a day (BID) | ORAL | Status: DC

## 2014-10-14 MED ORDER — FUROSEMIDE 40 MG PO TABS: 40.0000 mg | ORAL_TABLET | Freq: Two times a day (BID) | ORAL | Status: DC

## 2014-10-19 NOTE — Progress Notes (Signed)
Cardiology Office Note   Date:  10/20/2014   ID:  Terry [name], DOB September 15, 1955, MRN [id number]  PCP:  [name], MD  Cardiologist:  Dr. Armanda Magic   Electrophysiologist:  Dr. Lewayne Bunting  CHF:  Dr. Marca Ancona    Chief Complaint  Patient presents with  . Hospitalization Follow-up    Tiksoyn Load  . Atrial Fibrillation     History of Present Illness: Terry [name] is a 59 y.o. male with a hx of chronic systolic and chest in the setting of nonischemic cardiomyopathy, HTN, persistent atrial fibrillation, status post St. Jude ICD.  He underwent DCCV with restoration of NSR in 7/15.  He was noted to be back in AF in 1/16.  He underwent ICD implant with Dr. [name] in 6/16.  He was recently noted to have a decline in EF.  He is now followed in the HF Clinic.  Myoview was arranged in 7/16.  As demonstrated inferior and apical defect suggestive of prior infarct with mild peri-infarct ischemia. LHC was arranged 09/07/14. This demonstrated no significant CAD.  He felt less energetic in AF and therefore was admitted 8/24-8/27 for Tikosyn loading.  He converted to NSR.  Dose was adjusted due to prolonged QT.    Returns for FU.  Overall doing well.  He feels like he has more energy.  Denies chest pain, dyspnea, syncope, orthopnea, PND, edema.  He is NYHA 2.     Studies/Reports Reviewed Today:  LHC 09/07/14 1. No significant CAD. Nonischemic cardiomyopathy.  2. EF 35-40% by left ventriculogram.  Plan:  1. Continue medical management.  2. Restart Xarelto tomorrow morning. After 3-4 weeks Xarelto, will admit for Tikosyn loading.    Myoview 08/24/14  Nuclear stress EF: 39%.  There was no ST segment deviation noted during stress.  This is an intermediate risk study.  The left ventricular ejection fraction is moderately decreased (30-44%). Intermediate risk stress nuclear study with a moderate size, severe intensity, partially reversible inferior and apical defect consistent  with prior infarct and mild peri-infarct ischemia; EF 39 with global hypokinesis (note patient in atrial fibrillation and gating may not be accurate; suggest echo to better assess LV function); intermediate risk due to reduced LV function.  Echo 06/23/14 EF 25%, diff HK, mild MR, mild LAE    Past Medical History  Diagnosis Date  . GERD (gastroesophageal reflux disease)   . H/O hiatal hernia   . Aortic insufficiency     mild   . Ejection fraction < 50%     by echo 08/25/11   . H/O hematuria   . 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 Hypertension   . Aortic root dilatation   . AICD (automatic cardioverter/defibrillator) present   . Complication of anesthesia     hx of irregular beat after anesthesia in ~ 1990's  . Chronic systolic CHF (congestive heart failure), NYHA class 1   . Atrial fibrillation     "off and on since 2013" (08/05/2014)  . Dysrhythmia   . Rheumatoid arthritis dx'd 1995  . Anemia     "S/P knee & hip OR"  . History of blood transfusion 2013; 2014    S/P knee and hip OR  . Hepatitis 1960's    "caught it from my brother"  1. Nonischemic dilated cardiomyopathy: Echo (2006) with EF 15-20%. LHC in 10/06 showed normal coronaries. By 2014, EF had improved to 50%. Echo in 1/16 showed EF 35-40%. Echo (5/16)  showed EF 25% with mild MR. St Jude ICD 6/16. HIV negative, immunofixation with no monoclonal protein, and ferritin normal. Lexiscan Cardiolite (7/16) with EF 39% and partially reversible inferior and apical perfusion defect (intermediate risk).  2. Atrial fibrillation: Paroxysmal. First noted in 2013. DCCV in 7/15. Back in atrial fibrillation 1/16 and has remained in atrial fibrillation since that time.  3. HTN 4. Rheumatoid Arthritis: Has been off all medications for several years.  5. H/o TKR, h/o THR 6. Gynecomastia with spironolactone.    Past Surgical History  Procedure Laterality Date  . Nasal septum surgery  ~ 1975  . Urethral  stricture dilatation  "several times"  . Foot neuroma surgery Right     related to benign tumor   . Colonoscopy  2010; 2015    "polyps; no polyps"  . Total hip arthroplasty  09/08/2011    Procedure: TOTAL HIP ARTHROPLASTY ANTERIOR APPROACH;  Surgeon: [name], MD;  Location: [facility] ORS;  Service: Orthopedics;  Laterality: Right;  Right total hip replacement, left knee steroid injection  . Total knee arthroplasty Left 11/29/2012    Procedure: LEFT TOTAL KNEE ARTHROPLASTY;  Surgeon: [name], MD;  Location: [facility] ORS;  Service: Orthopedics;  Laterality: Left;  FEMORAL NERVE BLOCK IN HOLDING AREA LEFT LEG  . Cardiac catheterization  ~ 2004    normal  . Cardioversion N/A 08/21/2013    Procedure: CARDIOVERSION;  Surgeon: Quintella Reichert, MD;  Location: [facility] ENDOSCOPY;  Service: Cardiovascular;  Laterality: N/A;  . Cardiac defibrillator placement  08/05/2014    St. Jude  . Joint replacement    . Tonsillectomy    . Urethral fistula repair  ~ 1996  . Ep implantable device N/A 08/05/2014    Procedure: ICD Implant;  Surgeon: [name], MD;  Location: [facility] INVASIVE CV LAB;  Service: Cardiovascular;  Laterality: N/A;  . Cardiac catheterization N/A 09/07/2014    Procedure: Left Heart Cath and Coronary Angiography;  Surgeon: Laurey Morale, MD;  Location: [facility] INVASIVE CV LAB;  Service: Cardiovascular;  Laterality: N/A;     Current Outpatient Prescriptions  Medication Sig Dispense Refill  . carvedilol (COREG) 25 MG tablet Take 1 tablet (25 mg total) by mouth 2 (two) times daily. 180 tablet 1  . dofetilide (TIKOSYN) 250 MCG capsule Take 1 capsule (250 mcg total) by mouth 2 (two) times daily. 180 capsule 3  . eplerenone (INSPRA) 25 MG tablet Take 1 tablet (25 mg total) by mouth daily. 30 tablet 6  . furosemide (LASIX) 40 MG tablet Take 1 tablet (40 mg total) by mouth 2 (two) times daily. 180 tablet 1  . potassium chloride SA (K-DUR,KLOR-CON) 20 MEQ tablet Take 1 tablet (20 mEq total) by  mouth 2 (two) times daily. 180 tablet 1  . rivaroxaban (XARELTO) 20 MG TABS tablet Take 1 tablet (20 mg total) by mouth daily with supper. Resume on 08/07/14 30 tablet 11  . sacubitril-valsartan (ENTRESTO) 24-26 MG Take 1 tablet by mouth 2 (two) times daily. 60 tablet 3   No current facility-administered medications for this visit.    Allergies:   Spironolactone and Sulfa drugs cross reactors    Social History:  The patient  reports that he has never smoked. He has never used smokeless tobacco. He reports that he drinks about 0.6 oz of alcohol per week. He reports that he does not use illicit drugs.   Family History:  The patient's family history includes Heart attack (age of onset: 55) in his  father; Heart disease in his father; Heart failure in his brother.    ROS:   Please see the history of present illness.   Review of Systems  All other systems reviewed and are negative.     PHYSICAL EXAM: VS:  BP 100/60 mmHg  Pulse 66  Ht 5\' 11"  (1.803 m)  Wt 211 lb 9.6 oz (95.981 kg)  BMI 29.53 kg/m2    Wt Readings from Last 3 Encounters:  10/20/14 211 lb 9.6 oz (95.981 kg)  10/09/14 203 lb 9.6 oz (92.352 kg)  09/30/14 211 lb 12 oz (96.049 kg)     GEN: Well nourished, well developed, in no acute distress HEENT: normal Neck: no JVD,   no masses Cardiac:  Normal S1/S2, RRR; no murmur ,  no rubs or gallops, no edema   Respiratory:  clear to auscultation bilaterally, no wheezing, rhonchi or rales. GI: soft, nontender, nondistended, + BS MS: no deformity or atrophy Skin: warm and dry  Neuro:  CNs II-XII intact, Strength and sensation are intact Psych: Normal affect   EKG:  EKG is ordered today.  It demonstrates:   NSR, HR 66, LAD, Q waves in V1-2, QTc 488 ms   Recent Labs: 09/07/2014: Hemoglobin 13.0; Platelets 162 10/10/2014: BUN 23*; Creatinine, Ser 1.25*; Magnesium 2.1; Potassium 4.5; Sodium 133*    Lipid Panel No results found for: CHOL, TRIG, HDL, CHOLHDL, VLDL, LDLCALC,  LDLDIRECT    ASSESSMENT AND PLAN:  Persistent atrial fibrillation - Plan: EKG 12-Lead, Basic Metabolic Panel (BMET), Magnesium, dofetilide (TIKOSYN) 250 MCG capsule  Chronic systolic CHF (congestive heart failure)  Essential hypertension  Persistent Atrial Fibrillation:  Maintaining NSR now on Tikosyn.  Reviewed ECG with Dr. Lewayne Bunting.  QT is acceptable for him to remain on his current dose of Tikosyn.  Get FU BMET, Mg2+ today.  Continue Xarelto.    Chronic Systolic CHF:  He is NYHA 2.  Continue beta-blocker, Eplerenone, Entresto.  Hypertension:  Controlled.   Medication Changes: Current medicines are reviewed at length with the patient today.  Concerns regarding medicines are as outlined above.  The following changes have been made:   Discontinued Medications   No medications on file   Modified Medications   Modified Medication Previous Medication   DOFETILIDE (TIKOSYN) 250 MCG CAPSULE dofetilide (TIKOSYN) 250 MCG capsule      Take 1 capsule (250 mcg total) by mouth 2 (two) times daily.    Take 1 capsule (250 mcg total) by mouth 2 (two) times daily.   New Prescriptions   No medications on file    Labs/ tests ordered today include:   Orders Placed This Encounter  Procedures  . Basic Metabolic Panel (BMET)  . Magnesium  . EKG 12-Lead      Disposition:    FU with Dr. Marca Ancona, Dr. Lewayne Bunting and Dr. Armanda Magic as planned.    Signed, Brynda Rim, MHS 10/20/2014 11:00 AM    [facility] [address] [place], [place], [place]  [address] Phone: [phone]; Fax: [phone]

## 2014-10-20 ENCOUNTER — Encounter: Payer: Self-pay | Admitting: Physician Assistant

## 2014-10-20 ENCOUNTER — Ambulatory Visit (INDEPENDENT_AMBULATORY_CARE_PROVIDER_SITE_OTHER): Payer: Commercial Managed Care - HMO | Admitting: Physician Assistant

## 2014-10-20 VITALS — BP 100/60 | HR 66 | Ht 71.0 in | Wt 211.6 lb

## 2014-10-20 DIAGNOSIS — I4819 Other persistent atrial fibrillation: Secondary | ICD-10-CM

## 2014-10-20 DIAGNOSIS — I481 Persistent atrial fibrillation: Secondary | ICD-10-CM

## 2014-10-20 DIAGNOSIS — I10 Essential (primary) hypertension: Secondary | ICD-10-CM

## 2014-10-20 DIAGNOSIS — I4891 Unspecified atrial fibrillation: Secondary | ICD-10-CM | POA: Diagnosis not present

## 2014-10-20 DIAGNOSIS — I5022 Chronic systolic (congestive) heart failure: Secondary | ICD-10-CM | POA: Diagnosis not present

## 2014-10-20 LAB — BASIC METABOLIC PANEL
BUN: 25 mg/dL — ABNORMAL HIGH (ref 6–23)
CHLORIDE: 101 meq/L (ref 96–112)
CO2: 26 mEq/L (ref 19–32)
Calcium: 9 mg/dL (ref 8.4–10.5)
Creatinine, Ser: 1.32 mg/dL (ref 0.40–1.50)
GFR: 58.96 mL/min — AB (ref >60.00)
Glucose, Bld: 103 mg/dL — ABNORMAL HIGH (ref 70–99)
POTASSIUM: 4.3 meq/L (ref 3.5–5.1)
SODIUM: 136 meq/L (ref 135–145)

## 2014-10-20 LAB — MAGNESIUM: MAGNESIUM: 1.9 mg/dL (ref 1.5–2.5)

## 2014-10-20 MED ORDER — DOFETILIDE 250 MCG PO CAPS: 250.0000 ug | ORAL_CAPSULE | Freq: Two times a day (BID) | ORAL | Status: DC

## 2014-10-20 NOTE — Patient Instructions (Signed)
Medication Instructions:  A REFILL FOR TIKOSYN WAS SENT IN TO [facility]  Labwork: TODAY; BMET, MAGNESIUM LEVEL  Testing/Procedures: NONE  Follow-Up: KEEP YOUR APPT WITH DR. [name] 11/03/14  Any Other Special Instructions Will Be Listed Below (If Applicable).

## 2014-10-21 ENCOUNTER — Telehealth: Payer: Self-pay | Admitting: Physician Assistant

## 2014-10-21 ENCOUNTER — Telehealth: Payer: Self-pay | Admitting: *Deleted

## 2014-10-21 ENCOUNTER — Encounter: Payer: Self-pay | Admitting: *Deleted

## 2014-10-21 DIAGNOSIS — Z79899 Other long term (current) drug therapy: Secondary | ICD-10-CM

## 2014-10-21 DIAGNOSIS — I4891 Unspecified atrial fibrillation: Secondary | ICD-10-CM

## 2014-10-21 DIAGNOSIS — Z5181 Encounter for therapeutic drug level monitoring: Secondary | ICD-10-CM

## 2014-10-21 NOTE — Telephone Encounter (Signed)
New message     Pt states he is in range for magnesium according to the chart he was provided with.

## 2014-10-21 NOTE — Telephone Encounter (Signed)
Pt notified of lab results with instructions to increase dietary K+ and magnesium due to being on Tikosyn and mag 1.9. pt aware mag should be around 2 with Tikosyn. Pt will get repeat labs when he sees Dr. [name] 9/20 ok per Bing Neighbors. PA. Pt agreeable

## 2014-10-26 ENCOUNTER — Other Ambulatory Visit (HOSPITAL_COMMUNITY): Payer: Self-pay | Admitting: Cardiology

## 2014-10-26 DIAGNOSIS — I5022 Chronic systolic (congestive) heart failure: Secondary | ICD-10-CM

## 2014-10-26 MED ORDER — SACUBITRIL-VALSARTAN 24-26 MG PO TABS: 1.0000 | ORAL_TABLET | Freq: Two times a day (BID) | ORAL | Status: DC

## 2014-10-28 ENCOUNTER — Encounter: Payer: Self-pay | Admitting: Internal Medicine

## 2014-11-02 ENCOUNTER — Other Ambulatory Visit: Payer: Self-pay

## 2014-11-03 ENCOUNTER — Other Ambulatory Visit: Payer: Self-pay

## 2014-11-03 ENCOUNTER — Encounter: Payer: Self-pay | Admitting: Internal Medicine

## 2014-11-03 ENCOUNTER — Ambulatory Visit (INDEPENDENT_AMBULATORY_CARE_PROVIDER_SITE_OTHER): Payer: Commercial Managed Care - HMO | Admitting: Internal Medicine

## 2014-11-03 ENCOUNTER — Other Ambulatory Visit (INDEPENDENT_AMBULATORY_CARE_PROVIDER_SITE_OTHER): Payer: Commercial Managed Care - HMO | Admitting: *Deleted

## 2014-11-03 VITALS — BP 92/68 | HR 56 | Ht 71.0 in | Wt 211.2 lb

## 2014-11-03 DIAGNOSIS — I5022 Chronic systolic (congestive) heart failure: Secondary | ICD-10-CM

## 2014-11-03 DIAGNOSIS — Z95810 Presence of automatic (implantable) cardiac defibrillator: Secondary | ICD-10-CM | POA: Diagnosis not present

## 2014-11-03 DIAGNOSIS — I4891 Unspecified atrial fibrillation: Secondary | ICD-10-CM

## 2014-11-03 DIAGNOSIS — I10 Essential (primary) hypertension: Secondary | ICD-10-CM | POA: Diagnosis not present

## 2014-11-03 DIAGNOSIS — Z79899 Other long term (current) drug therapy: Secondary | ICD-10-CM

## 2014-11-03 DIAGNOSIS — I429 Cardiomyopathy, unspecified: Secondary | ICD-10-CM

## 2014-11-03 DIAGNOSIS — Z5181 Encounter for therapeutic drug level monitoring: Secondary | ICD-10-CM

## 2014-11-03 DIAGNOSIS — I420 Dilated cardiomyopathy: Secondary | ICD-10-CM

## 2014-11-03 LAB — CUP PACEART INCLINIC DEVICE CHECK
Battery Remaining Longevity: 97.2 mo
HIGH POWER IMPEDANCE MEASURED VALUE: 82 Ohm
Lead Channel Impedance Value: 562.5 Ohm
Lead Channel Pacing Threshold Amplitude: 0.5 V
Lead Channel Pacing Threshold Pulse Width: 0.5 ms
Lead Channel Sensing Intrinsic Amplitude: 11.5 mV
Lead Channel Setting Sensing Sensitivity: 0.5 mV
MDC IDC MSMT LEADCHNL RV PACING THRESHOLD AMPLITUDE: 0.5 V
MDC IDC MSMT LEADCHNL RV PACING THRESHOLD PULSEWIDTH: 0.5 ms
MDC IDC SESS DTM: 20160920122959
MDC IDC SET LEADCHNL RV PACING AMPLITUDE: 2.5 V
MDC IDC SET LEADCHNL RV PACING PULSEWIDTH: 0.5 ms
MDC IDC SET ZONE DETECTION INTERVAL: 300 ms
MDC IDC SET ZONE DETECTION INTERVAL: 330 ms
MDC IDC STAT BRADY RV PERCENT PACED: 0.42 %
Pulse Gen Serial Number: 7280026

## 2014-11-03 LAB — BASIC METABOLIC PANEL
BUN: 22 mg/dL (ref 6–23)
CHLORIDE: 98 meq/L (ref 96–112)
CO2: 27 meq/L (ref 19–32)
Calcium: 9.2 mg/dL (ref 8.4–10.5)
Creatinine, Ser: 1.16 mg/dL (ref 0.40–1.50)
GFR: 68.43 mL/min (ref >60.00)
Glucose, Bld: 101 mg/dL — ABNORMAL HIGH (ref 70–99)
POTASSIUM: 4.6 meq/L (ref 3.5–5.1)
SODIUM: 133 meq/L — AB (ref 135–145)

## 2014-11-03 LAB — MAGNESIUM: MAGNESIUM: 2.1 mg/dL (ref 1.5–2.5)

## 2014-11-03 NOTE — Patient Instructions (Signed)
Medication Instructions:  Your physician recommends that you continue on your current medications as directed. Please refer to the Current Medication list given to you today.   Labwork: None ordered  Testing/Procedures: None ordered  Follow-Up: Your physician wants you to follow-up in: 9 months with Dr Taylor You will receive a reminder letter in the mail two months in advance. If you don't receive a letter, please call our office to schedule the follow-up appointment.  Remote monitoring is used to monitor your  ICD from home. This monitoring reduces the number of office visits required to check your device to one time per year. It allows us to keep an eye on the functioning of your device to ensure it is working properly. You are scheduled for a device check from home on 02/02/15. You may send your transmission at any time that day. If you have a wireless device, the transmission will be sent automatically. After your physician reviews your transmission, you will receive a postcard with your next transmission date.     Any Other Special Instructions Will Be Listed Below (If Applicable).   

## 2014-11-03 NOTE — Assessment & Plan Note (Signed)
His St. Jude ICD is working normally. Will recheck in several months.  

## 2014-11-03 NOTE — Progress Notes (Signed)
HPI [name] returns today for followup of his  ICD, implanted several months ago. He is a pleasant 59 yo man with atrial fibrillation, RA, s/p knee replacement and hip replacement, chronic systolic heart failure who has had progressively worsening symptoms over the past 6 months despite maximal medical therapy. He was cardioverted but maintained NSR only briefly. He did not know that he was in atrial fibrillation on presentation in the past. The patient has never had syncope. His EF is 25% by echo, previously 35%. He underwent ICD implant and was then placed on Tikosyn. He has returned to NSR. He feels better. Allergies  Allergen Reactions  . Spironolactone     Gynecomastia  . Sulfa Antibiotics     Thrush and hives     Current Outpatient Prescriptions  Medication Sig Dispense Refill  . carvedilol (COREG) 25 MG tablet Take 1 tablet (25 mg total) by mouth 2 (two) times daily. 180 tablet 1  . dofetilide (TIKOSYN) 250 MCG capsule Take 1 capsule (250 mcg total) by mouth 2 (two) times daily. 180 capsule 3  . eplerenone (INSPRA) 25 MG tablet Take 1 tablet (25 mg total) by mouth daily. 30 tablet 6  . furosemide (LASIX) 40 MG tablet Take 1 tablet (40 mg total) by mouth 2 (two) times daily. 180 tablet 1  . potassium chloride SA (K-DUR,KLOR-CON) 20 MEQ tablet Take 1 tablet (20 mEq total) by mouth 2 (two) times daily. 180 tablet 1  . rivaroxaban (XARELTO) 20 MG TABS tablet Take 1 tablet (20 mg total) by mouth daily with supper. Resume on 08/07/14 30 tablet 11  . sacubitril-valsartan (ENTRESTO) 24-26 MG Take 1 tablet by mouth 2 (two) times daily. 60 tablet 3   No current facility-administered medications for this visit.     Past Medical History  Diagnosis Date  . GERD (gastroesophageal reflux disease)   . H/O hiatal hernia   . Aortic insufficiency     mild   . Ejection fraction < 50%     by echo 08/25/11   . H/O hematuria   . 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  Hypertension   . Aortic root dilatation   . AICD (automatic cardioverter/defibrillator) present   . Complication of anesthesia     hx of irregular beat after anesthesia in ~ 1990's  . Chronic systolic CHF (congestive heart failure), NYHA class 1   . Atrial fibrillation     "off and on since 2013" (08/05/2014)  . Dysrhythmia   . Rheumatoid arthritis dx'd 1995  . Anemia     "S/P knee & hip OR"  . History of blood transfusion 2013; 2014    S/P knee and hip OR  . Hepatitis 1960's    "caught it from my brother"    ROS:   All systems reviewed and negative except as noted in the HPI.   Past Surgical History  Procedure Laterality Date  . Nasal septum surgery  ~ 1975  . Urethral stricture dilatation  "several times"  . Foot neuroma surgery Right     related to benign tumor   . Colonoscopy  2010; 2015    "polyps; no polyps"  . Total hip arthroplasty  09/08/2011    Procedure: TOTAL HIP ARTHROPLASTY ANTERIOR APPROACH;  Surgeon: [name], MD;  Location: [facility]S;  Service: Orthopedics;  Laterality: Right;  Right total hip replacement, left knee steroid injection  . Total knee arthroplasty Left 11/29/2012  Procedure: LEFT TOTAL KNEE ARTHROPLASTY;  Surgeon: [name], MD;  Location: [facility];  Service: Orthopedics;  Laterality: Left;  FEMORAL NERVE BLOCK IN HOLDING AREA LEFT LEG  . Cardiac catheterization  ~ 2004    normal  . Cardioversion N/A 08/21/2013    Procedure: CARDIOVERSION;  Surgeon: Quintella Reichert, MD;  Location: [facility] ENDOSCOPY;  Service: Cardiovascular;  Laterality: N/A;  . Cardiac defibrillator placement  08/05/2014    St. Jude  . Joint replacement    . Tonsillectomy    . Urethral fistula repair  ~ 1996  . Ep implantable device N/A 08/05/2014    Procedure: ICD Implant;  Surgeon: [name], MD;  Location: [facility] INVASIVE CV LAB;  Service: Cardiovascular;  Laterality: N/A;  . Cardiac catheterization N/A 09/07/2014    Procedure: Left Heart Cath and Coronary  Angiography;  Surgeon: Laurey Morale, MD;  Location: [facility] INVASIVE CV LAB;  Service: Cardiovascular;  Laterality: N/A;     Family History  Problem Relation Age of Onset  . Heart disease Father   . Heart attack Father 54  . Heart failure Brother      Social History   Social History  . Marital Status: Married    Spouse Name: N/A  . Number of Children: N/A  . Years of Education: N/A   Occupational History  . Not on file.   Social History Main Topics  . Smoking status: Never Smoker   . Smokeless tobacco: Never Used  . Alcohol Use: 0.6 oz/week    1 Cans of beer per week  . Drug Use: No  . Sexual Activity: Yes   Other Topics Concern  . Not on file   Social History Narrative     BP 92/68 mmHg  Pulse 56  Ht 5\' 11"  (1.803 m)  Wt 211 lb 3.2 oz (95.8 kg)  BMI 29.47 kg/m2  Physical Exam:  Well appearing NAD HEENT: Unremarkable Neck:  No JVD, no thyromegally Lymphatics:  No adenopathy Back:  No CVA tenderness Lungs:  Clear with no wheezes HEART:  Regular rate rhythm, no murmurs, no rubs, no clicks Abd:  soft, positive bowel sounds, no organomegally, no rebound, no guarding Ext:  2 plus pulses, no edema, no cyanosis, no clubbing Skin:  No rashes no nodules Neuro:  CN II through XII intact, motor grossly intact  EKG - NSR   Assess/Plan:

## 2014-11-03 NOTE — Assessment & Plan Note (Signed)
His blood pressure remains well controlled. No change in his meds.

## 2014-11-03 NOTE — Assessment & Plan Note (Signed)
In NSR, his symptoms have gone from class 3 to class 2. He will continue his current meds. Will follow.

## 2014-11-11 ENCOUNTER — Encounter (HOSPITAL_COMMUNITY): Payer: Self-pay

## 2014-11-11 ENCOUNTER — Ambulatory Visit (HOSPITAL_COMMUNITY)
Admission: RE | Admit: 2014-11-11 | Discharge: 2014-11-11 | Disposition: A | Discharge status: Home / Self Care | Payer: Commercial Managed Care - HMO | Source: Ambulatory Visit | Attending: Cardiology | Admitting: Cardiology

## 2014-11-11 VITALS — BP 102/64 | HR 57 | Wt 211.5 lb

## 2014-11-11 DIAGNOSIS — M069 Rheumatoid arthritis, unspecified: Secondary | ICD-10-CM | POA: Diagnosis not present

## 2014-11-11 DIAGNOSIS — I5022 Chronic systolic (congestive) heart failure: Secondary | ICD-10-CM | POA: Insufficient documentation

## 2014-11-11 DIAGNOSIS — Z95810 Presence of automatic (implantable) cardiac defibrillator: Secondary | ICD-10-CM | POA: Insufficient documentation

## 2014-11-11 DIAGNOSIS — Z882 Allergy status to sulfonamides status: Secondary | ICD-10-CM | POA: Insufficient documentation

## 2014-11-11 DIAGNOSIS — Z8249 Family history of ischemic heart disease and other diseases of the circulatory system: Secondary | ICD-10-CM | POA: Diagnosis not present

## 2014-11-11 DIAGNOSIS — I480 Paroxysmal atrial fibrillation: Secondary | ICD-10-CM | POA: Insufficient documentation

## 2014-11-11 DIAGNOSIS — I428 Other cardiomyopathies: Secondary | ICD-10-CM | POA: Insufficient documentation

## 2014-11-11 DIAGNOSIS — I4891 Unspecified atrial fibrillation: Secondary | ICD-10-CM | POA: Diagnosis not present

## 2014-11-11 DIAGNOSIS — I10 Essential (primary) hypertension: Secondary | ICD-10-CM | POA: Diagnosis not present

## 2014-11-11 DIAGNOSIS — Z7902 Long term (current) use of antithrombotics/antiplatelets: Secondary | ICD-10-CM | POA: Diagnosis not present

## 2014-11-11 NOTE — Progress Notes (Signed)
Patient ID: Terry [name], male   DOB: 01/09/1956, 59 y.o.   MRN: [id number] Primary Care: [name], MD Primary Cardiologist: Armanda Magic, MD  HPI: Terry [name] is a 59 y.o. 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 He presents as a new patient to the clinic today. He has had notable LV EF decline in the past six months despite guideline directed therapy by Dr. Armanda Magic and has been referred to [place] for evaluation.  Back in 2006, an echo showed EF 15-20%.  LHC at that time showed normal coronaries.  He has been managed for cardiomyopathy since that time. In 12/14, EF had improved to 50%.  However, he has had a decline since then.  Last echo in 5/16 showed EF 25%.  Atrial fibrillation was first noted in 2013.  In 7/15, he had DCCV to NSR.  In 1/16, he was noted to be back in atrial fibrillation and appears to have been in atrial fibrillation since that time. He had St Jude ICD placed by Dr [name] in 6/16.  Given fall in EF, Lexiscan Cardiolite was done. This showed EF 39% with partially reversible inferior and apical perfusion defect. He had LHC in 7/16 showing no significant CAD.  In 8/16, he was admitted for Tikosyn initiation and converted to NSR.   Generally doing well.  Painful gynecomastia resolved off spironolactone, now on eplerenone.  He remains in NSR today.  No exertional dyspnea.  Stays active at work and doing yardwork at home.  No orthopnea/PND.  No chest pain.  No lightheadedness or syncope. No melena or BRBPR. Energy level is better with less fatigue now that he is back in NSR.   ECG: NSR, LAFB, QTc 469 msec    Labs (5/16): HIV negative, ferritin normal, immunofixation with no monoclonal protein.  Labs (6/16): K 4.2, Creatinine 1.08, HCT 37.5 Labs (08/18/14): K 4.2, Creatinine 1.0 Labs (8/16): K 4.7, creatinine 1.18 Labs (9/16): Mg 2.1, creatinine 1.16, K 4.6  PMH 1. Nonischemic dilated  cardiomyopathy: Echo (2006) with EF 15-20%.  LHC in 10/06 showed normal coronaries.  By 2014, EF had improved to 50%.  Echo in 1/16 showed EF 35-40%.  Echo (5/16) showed EF 25% with mild MR.  St Jude ICD 6/16.  HIV negative, immunofixation with no monoclonal protein, and ferritin normal.  Lexiscan Cardiolite (7/16) with EF 39% and partially reversible inferior and apical perfusion defect (intermediate risk).   LHC (7/16) with EF 35-40%, no significant CAD.  2. Atrial fibrillation: Paroxysmal.  First noted in 2013.  DCCV in 7/15.  Back in atrial fibrillation 1/16.  Converted back to NSR with Tikosyn in 8/16.  3. HTN 4. Rheumatoid Arthritis: Has been off all medications for several years.   5. H/o TKR, h/o THR 6. Gynecomastia with spironolactone.   SH: Lives at home with wife and 2 children and a grandchild. Never smoker, drinks 1 or 2 beers a week with dinner. 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   FH: Father with MI at 37, brother with atrial fibrillation, mother with renal failure.   ROS: All systems reviewed and negative except as per HPI.   Current Outpatient Prescriptions  Medication Sig Dispense Refill  . dofetilide (TIKOSYN) 250 MCG capsule Take 1 capsule (250 mcg total) by mouth 2 (two) times daily. 180 capsule 3  . eplerenone (INSPRA) 25 MG tablet Take 1 tablet (25 mg total) by mouth daily.  30 tablet 6  . furosemide (LASIX) 40 MG tablet Take 1 tablet (40 mg total) by mouth 2 (two) times daily. 180 tablet 1  . potassium chloride SA (K-DUR,KLOR-CON) 20 MEQ tablet Take 1 tablet (20 mEq total) by mouth 2 (two) times daily. 180 tablet 1  . rivaroxaban (XARELTO) 20 MG TABS tablet Take 1 tablet (20 mg total) by mouth daily with supper. Resume on 08/07/14 30 tablet 11  . sacubitril-valsartan (ENTRESTO) 24-26 MG Take 1 tablet by mouth 2 (two) times daily. 60 tablet 3  . carvedilol (COREG) 25 MG tablet Take 1 tablet (25 mg total) by mouth 2 (two) times daily. 180 tablet 1   No current  facility-administered medications for this encounter.    Allergies  Allergen Reactions  . Spironolactone     Gynecomastia  . Sulfa Antibiotics     Thrush and hives   BP 102/64 mmHg  Pulse 57  Wt 211 lb 8 oz (95.936 kg)  SpO2 99% PHYSICAL EXAM: General:  Well appearing. No respiratory difficulty HEENT: normal Neck: supple. no JVD. Carotids 2+ bilat; no bruits. No lymphadenopathy or thryomegaly appreciated. Cor: PMI nondisplaced. Regular rate & rhythm. No rubs, gallops or murmurs appreciated Lungs: clear Abdomen: soft, nontender, nondistended. No hepatosplenomegaly. No bruits or masses. Good bowel sounds. Extremities: no cyanosis, clubbing, rash, edema Neuro: alert & oriented x 3, cranial nerves grossly intact. moves all 4 extremities w/o difficulty. Affect pleasant.  ASSESSMENT & PLAN:  1. Chronic systolic HF: Nonischemic cardiomyopathy x years. NYHA class II.  He has a Secondary school teacher ICD and is not a candidate for CRT due to narrow QRS. 7/16 LHC showed no significant CAD.  Euvolemic on exam.  - Continue Lasix 40 mg bid - Continue current Coreg.  - Continue Entresto 24/26 mg bid => does not have BP room to increase.  - Gynecomastia resolved off spironolactone.  He is doing fine on eplerenone.  2.  Atrial fibrillation: Paroxysmal.  Holding NSR on Tikosyn.   - ECG and labs on Tikosyn ok.   - Continue Xarelto 20 mg daily  Marca Ancona 11/11/2014

## 2014-11-11 NOTE — Patient Instructions (Signed)
We will contact you in 3 months to schedule your next appointment.  

## 2014-11-17 ENCOUNTER — Encounter: Payer: Self-pay | Admitting: Internal Medicine

## 2014-12-08 ENCOUNTER — Encounter: Payer: Self-pay | Admitting: Internal Medicine

## 2014-12-31 ENCOUNTER — Other Ambulatory Visit: Payer: Self-pay | Admitting: Nurse Practitioner

## 2014-12-31 ENCOUNTER — Telehealth: Payer: Self-pay | Admitting: Cardiology

## 2014-12-31 MED ORDER — RIVAROXABAN 20 MG PO TABS: 20.0000 mg | ORAL_TABLET | Freq: Every day | ORAL | Status: DC

## 2014-12-31 NOTE — Telephone Encounter (Signed)
Pt's Rx was sent to pt's pharmacy requested.

## 2015-01-14 ENCOUNTER — Other Ambulatory Visit: Payer: Self-pay | Admitting: *Deleted

## 2015-01-14 MED ORDER — EPLERENONE 25 MG PO TABS: 25.0000 mg | ORAL_TABLET | Freq: Every day | ORAL | Status: DC

## 2015-01-17 ENCOUNTER — Other Ambulatory Visit: Payer: Self-pay | Admitting: Cardiology

## 2015-01-22 ENCOUNTER — Encounter: Payer: Self-pay | Admitting: Cardiology

## 2015-01-22 ENCOUNTER — Other Ambulatory Visit (HOSPITAL_COMMUNITY): Payer: Self-pay | Admitting: Internal Medicine

## 2015-02-02 ENCOUNTER — Ambulatory Visit (INDEPENDENT_AMBULATORY_CARE_PROVIDER_SITE_OTHER): Payer: Commercial Managed Care - HMO | Admitting: *Deleted

## 2015-02-02 ENCOUNTER — Telehealth: Payer: Self-pay | Admitting: Internal Medicine

## 2015-02-02 DIAGNOSIS — I429 Cardiomyopathy, unspecified: Secondary | ICD-10-CM

## 2015-02-02 DIAGNOSIS — I420 Dilated cardiomyopathy: Secondary | ICD-10-CM

## 2015-02-02 NOTE — Progress Notes (Signed)
Remote ICD transmission.   

## 2015-02-02 NOTE — Telephone Encounter (Signed)
Spoke w/ pt and informed him that his remote transmission was received. Pt verbalized understanding.  

## 2015-02-02 NOTE — Telephone Encounter (Signed)
New Message  Pt requested to speak w/ Device on if remote signal was sent succesffuly. Please call back and discuss.

## 2015-02-04 ENCOUNTER — Other Ambulatory Visit: Payer: Self-pay | Admitting: Cardiology

## 2015-02-05 ENCOUNTER — Encounter: Payer: Self-pay | Admitting: Cardiology

## 2015-02-05 LAB — CUP PACEART REMOTE DEVICE CHECK
Battery Remaining Longevity: 98 mo
Battery Remaining Percentage: 95 %
HIGH POWER IMPEDANCE MEASURED VALUE: 80 Ohm
HIGH POWER IMPEDANCE MEASURED VALUE: 80 Ohm
Implantable Lead Location: 753860
Implantable Lead Serial Number: 331169
Lead Channel Impedance Value: 510 Ohm
Lead Channel Sensing Intrinsic Amplitude: 11.5 mV
Lead Channel Setting Pacing Amplitude: 2.5 V
Lead Channel Setting Pacing Pulse Width: 0.5 ms
MDC IDC LEAD IMPLANT DT: 20160622
MDC IDC LEAD MODEL: 181
MDC IDC MSMT BATTERY VOLTAGE: 3.17 V
MDC IDC PG SERIAL: 7280026
MDC IDC SESS DTM: 20161220070015
MDC IDC SET LEADCHNL RV SENSING SENSITIVITY: 0.5 mV
MDC IDC STAT BRADY RV PERCENT PACED: 1 %

## 2015-02-10 ENCOUNTER — Other Ambulatory Visit: Payer: Self-pay | Admitting: Cardiology

## 2015-02-10 NOTE — Telephone Encounter (Signed)
eplerenone (INSPRA) 25 MG tablet  Medication   Date: 01/14/2015  Department: [facility] Office  Ordering/Authorizing: Quintella Reichert, MD      Order Providers    Prescribing Provider Encounter Provider   Quintella Reichert, MD Valrie Hart, CMA    Medication Detail      Disp Refills Start End     eplerenone (INSPRA) 25 MG tablet 90 tablet 2 01/14/2015     Sig - Route: Take 1 tablet (25 mg total) by mouth daily. - Oral    E-Prescribing Status: Receipt confirmed by pharmacy (01/14/2015 12:13 PM EST)     Pharmacy    [facility] - [place], [place] - [address]        Additional Information    [employer]   Refilled requested to soon

## 2015-02-11 ENCOUNTER — Other Ambulatory Visit: Payer: Self-pay | Admitting: Cardiology

## 2015-02-11 ENCOUNTER — Telehealth: Payer: Self-pay

## 2015-02-11 NOTE — Telephone Encounter (Signed)
eplerenone (INSPRA) 25 MG tablet  Medication   Date: 01/14/2015  Department: [facility] Office  Ordering/Authorizing: Quintella Reichert, MD      Order Providers    Prescribing Provider Encounter Provider   Quintella Reichert, MD Valrie Hart, CMA    Medication Detail      Disp Refills Start End     eplerenone (INSPRA) 25 MG tablet 90 tablet 2 01/14/2015     Sig - Route: Take 1 tablet (25 mg total) by mouth daily. - Oral    E-Prescribing Status: Receipt confirmed by pharmacy (01/14/2015 12:13 PM EST)     Pharmacy    [facility] - [place], [place] - [address]     [facility] the [facility]

## 2015-02-11 NOTE — Telephone Encounter (Signed)
Pt called to get refill of INSPRA sent to [facility] [address]. I let him know it was [facility] on 01/14/2015. Pt stated he would check when he gets home to see if medication was in mailbox and will call us back if its not.

## 2015-02-22 ENCOUNTER — Other Ambulatory Visit: Payer: Self-pay | Admitting: Cardiology

## 2015-02-22 DIAGNOSIS — I4819 Other persistent atrial fibrillation: Secondary | ICD-10-CM

## 2015-02-22 MED ORDER — CARVEDILOL 25 MG PO TABS: 25.0000 mg | ORAL_TABLET | Freq: Two times a day (BID) | ORAL | Status: DC

## 2015-02-22 MED ORDER — FUROSEMIDE 40 MG PO TABS: 40.0000 mg | ORAL_TABLET | Freq: Two times a day (BID) | ORAL | Status: DC

## 2015-02-22 MED ORDER — DOFETILIDE 250 MCG PO CAPS: 250.0000 ug | ORAL_CAPSULE | Freq: Two times a day (BID) | ORAL | Status: DC

## 2015-02-22 MED ORDER — POTASSIUM CHLORIDE CRYS ER 20 MEQ PO TBCR: 20.0000 meq | EXTENDED_RELEASE_TABLET | Freq: Two times a day (BID) | ORAL | Status: DC

## 2015-02-22 MED ORDER — EPLERENONE 25 MG PO TABS: 25.0000 mg | ORAL_TABLET | Freq: Every day | ORAL | Status: DC

## 2015-03-04 ENCOUNTER — Encounter (HOSPITAL_COMMUNITY): Payer: Self-pay

## 2015-03-04 ENCOUNTER — Encounter: Payer: Self-pay | Admitting: Cardiology

## 2015-03-04 ENCOUNTER — Encounter: Payer: Self-pay | Admitting: Internal Medicine

## 2015-03-04 ENCOUNTER — Telehealth (HOSPITAL_COMMUNITY): Payer: Self-pay | Admitting: Vascular Surgery

## 2015-03-04 NOTE — Telephone Encounter (Signed)
Responded to pt through [place], we are working on [place]

## 2015-03-04 NOTE — Telephone Encounter (Signed)
PT needs auth for [employer] .Marland Kitchen Please call pt cell [phone]

## 2015-03-09 ENCOUNTER — Encounter: Payer: Self-pay | Admitting: Internal Medicine

## 2015-03-09 ENCOUNTER — Encounter (HOSPITAL_COMMUNITY): Payer: Self-pay

## 2015-03-09 ENCOUNTER — Telehealth (HOSPITAL_COMMUNITY): Payer: Self-pay | Admitting: Pharmacist

## 2015-03-09 ENCOUNTER — Encounter: Payer: Self-pay | Admitting: Cardiology

## 2015-03-09 NOTE — Telephone Encounter (Signed)
Entresto 24-26 mg PA approved by [facility] through 03/15/16. [facility]ified [facility] copay of $75/mo but should be $10/mo with copay card. Spoke with patient who states that he will be unemployed as of 2/1 and will be on his wife's [facility] at that time. He has enough Entresto to get him through the end of the month and will call on 2/1 with new insurance information so that I can send in the PA at that time.   Tyler Deis. Bonnye Fava, PharmD, BCPS, CPP Clinical Pharmacist Pager: [phone] Phone: [phone] 03/09/2015 11:13 AM

## 2015-03-15 ENCOUNTER — Ambulatory Visit (HOSPITAL_COMMUNITY)
Admission: RE | Admit: 2015-03-15 | Discharge: 2015-03-15 | Disposition: A | Discharge status: Home / Self Care | Payer: Managed Care, Other (non HMO) | Source: Ambulatory Visit | Attending: Cardiology | Admitting: Cardiology

## 2015-03-15 ENCOUNTER — Encounter (HOSPITAL_COMMUNITY): Payer: Self-pay

## 2015-03-15 ENCOUNTER — Telehealth (HOSPITAL_COMMUNITY): Payer: Self-pay

## 2015-03-15 VITALS — BP 116/70 | HR 60 | Wt 204.0 lb

## 2015-03-15 DIAGNOSIS — M069 Rheumatoid arthritis, unspecified: Secondary | ICD-10-CM | POA: Insufficient documentation

## 2015-03-15 DIAGNOSIS — Z882 Allergy status to sulfonamides status: Secondary | ICD-10-CM | POA: Insufficient documentation

## 2015-03-15 DIAGNOSIS — Z79899 Other long term (current) drug therapy: Secondary | ICD-10-CM | POA: Insufficient documentation

## 2015-03-15 DIAGNOSIS — I428 Other cardiomyopathies: Secondary | ICD-10-CM | POA: Diagnosis not present

## 2015-03-15 DIAGNOSIS — Z8249 Family history of ischemic heart disease and other diseases of the circulatory system: Secondary | ICD-10-CM | POA: Insufficient documentation

## 2015-03-15 DIAGNOSIS — Z95810 Presence of automatic (implantable) cardiac defibrillator: Secondary | ICD-10-CM | POA: Insufficient documentation

## 2015-03-15 DIAGNOSIS — Z841 Family history of disorders of kidney and ureter: Secondary | ICD-10-CM | POA: Diagnosis not present

## 2015-03-15 DIAGNOSIS — Z888 Allergy status to other drugs, medicaments and biological substances status: Secondary | ICD-10-CM | POA: Insufficient documentation

## 2015-03-15 DIAGNOSIS — Z7902 Long term (current) use of antithrombotics/antiplatelets: Secondary | ICD-10-CM | POA: Diagnosis not present

## 2015-03-15 DIAGNOSIS — I480 Paroxysmal atrial fibrillation: Secondary | ICD-10-CM | POA: Diagnosis not present

## 2015-03-15 DIAGNOSIS — Z96659 Presence of unspecified artificial knee joint: Secondary | ICD-10-CM | POA: Insufficient documentation

## 2015-03-15 DIAGNOSIS — Z96649 Presence of unspecified artificial hip joint: Secondary | ICD-10-CM | POA: Diagnosis not present

## 2015-03-15 DIAGNOSIS — I110 Hypertensive heart disease with heart failure: Secondary | ICD-10-CM | POA: Insufficient documentation

## 2015-03-15 DIAGNOSIS — I5022 Chronic systolic (congestive) heart failure: Secondary | ICD-10-CM | POA: Diagnosis present

## 2015-03-15 LAB — BASIC METABOLIC PANEL
Anion gap: 9 (ref 5–15)
BUN: 19 mg/dL (ref 6–20)
CALCIUM: 9.3 mg/dL (ref 8.9–10.3)
CO2: 27 mmol/L (ref 22–32)
Chloride: 104 mmol/L (ref 101–111)
Creatinine, Ser: 1.26 mg/dL — ABNORMAL HIGH (ref 0.61–1.24)
GFR calc Af Amer: >60 mL/min (ref >60)
GLUCOSE: 100 mg/dL — AB (ref 65–99)
Potassium: 4.9 mmol/L (ref 3.5–5.1)
SODIUM: 140 mmol/L (ref 135–145)

## 2015-03-15 LAB — MAGNESIUM: Magnesium: 2.1 mg/dL (ref 1.7–2.4)

## 2015-03-15 NOTE — Patient Instructions (Signed)
Follow up in 3 months with Dr.McLean  Routine lab work today. (bmet) Will notify you of abnormal results

## 2015-03-15 NOTE — Telephone Encounter (Signed)
Disability forms mailed to [facility] DDS Case # [id number] Release form scanned into Medical Record

## 2015-03-15 NOTE — Progress Notes (Signed)
Patient ID: Terry [name], male   DOB: [birth year], 60 y.o.   MRN: [id number] Primary Care: [name], MD Primary Cardiologist: Armanda Magic, MD Primary HF Cardiologist: Dr [name]  HPI: Terry [name] is a 60 y.o. 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 He presents as a new patient to the clinic today. He has had notable LV EF decline in the past six months despite guideline directed therapy by Dr. Armanda Magic and has been referred to [place] for evaluation.  Back in 2006, an echo showed EF 15-20%.  LHC at that time showed normal coronaries.  He has been managed for cardiomyopathy since that time. In 12/14, EF had improved to 50%.  However, he has had a decline since then.  Last echo in 5/16 showed EF 25%.  Atrial fibrillation was first noted in 2013.  In 7/15, he had DCCV to NSR.  In 1/16, he was noted to be back in atrial fibrillation and appears to have been in atrial fibrillation since that time. He had St Jude ICD placed by Dr [name] in 6/16.  Given fall in EF, Lexiscan Cardiolite was done. This showed EF 39% with partially reversible inferior and apical perfusion defect. He had LHC in 7/16 showing no significant CAD.  In 8/16, he was admitted for Tikosyn initiation and converted to NSR.   He returns for HF follow up with his wife. Overall feeling ok. SOB after walking a block. Denies PND/Orthopnea. SOB with steps. Weight at home 204 pounds. Taking all medications. Currently not working. Limited home activities.  He remains in NSR today.   Corevue checked today: No evidence for volume overload.   EKG: Sinus Rhythm 67 bpm with frequent PVCs. QTc 483 ms   HIV negative, ferritin normal, immunofixation with no monoclonal protein.  Labs (6/16): K 4.2, Creatinine 1.08, HCT 37.5 Labs (08/18/14): K 4.2, Creatinine 1.0 Labs (8/16): K 4.7, creatinine 1.18 Labs (9/16): Mg 2.1, creatinine 1.16, K 4.6  PMH 1. Nonischemic  dilated cardiomyopathy: Echo (2006) with EF 15-20%.  LHC in 10/06 showed normal coronaries.  By 2014, EF had improved to 50%.  Echo in 1/16 showed EF 35-40%.  Echo (5/16) showed EF 25% with mild MR.  St Jude ICD 6/16.  HIV negative, immunofixation with no monoclonal protein, and ferritin normal.  Lexiscan Cardiolite (7/16) with EF 39% and partially reversible inferior and apical perfusion defect (intermediate risk).   LHC (7/16) with EF 35-40%, no significant CAD.  2. Atrial fibrillation: Paroxysmal.  First noted in 2013.  DCCV in 7/15.  Back in atrial fibrillation 1/16.  Converted back to NSR with Tikosyn in 8/16.  3. HTN 4. Rheumatoid Arthritis: Has been off all medications for several years.   5. H/o TKR, h/o THR 6. Gynecomastia with spironolactone.   SH: Lives at home with wife and 2 children and a grandchild. Never smoker, drinks 1 or 2 beers a week with dinner. 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   FH: Father with MI at 50, brother with atrial fibrillation, mother with renal failure.   ROS: All systems reviewed and negative except as per HPI.   Current Outpatient Prescriptions  Medication Sig Dispense Refill  . carvedilol (COREG) 25 MG tablet Take 1 tablet (25 mg total) by mouth 2 (two) times daily. 180 tablet 3  . dofetilide (TIKOSYN) 250 MCG capsule Take 1 capsule (250 mcg total) by  mouth 2 (two) times daily. 180 capsule 3  . ENTRESTO 24-26 MG TAKE 1 TABLET BY MOUTH 2 (TWO) TIMES DAILY. 60 tablet 3  . eplerenone (INSPRA) 25 MG tablet Take 1 tablet (25 mg total) by mouth daily. 90 tablet 3  . furosemide (LASIX) 40 MG tablet Take 1 tablet (40 mg total) by mouth 2 (two) times daily. 180 tablet 3  . potassium chloride SA (K-DUR,KLOR-CON) 20 MEQ tablet Take 1 tablet (20 mEq total) by mouth 2 (two) times daily. 180 tablet 3  . rivaroxaban (XARELTO) 20 MG TABS tablet Take 1 tablet (20 mg total) by mouth daily with supper. Resume on 08/07/14 30 tablet 9   No current  facility-administered medications for this encounter.    Allergies  Allergen Reactions  . Spironolactone     Gynecomastia  . Sulfa Antibiotics     Thrush and hives   BP 116/70 mmHg  Pulse 60  Wt 204 lb (92.534 kg)  SpO2 99% PHYSICAL EXAM: General:  Well appearing. No respiratory difficulty HEENT: normal Neck: supple. no JVD. Carotids 2+ bilat; no bruits. No lymphadenopathy or thryomegaly appreciated. Cor: PMI nondisplaced. Regular rate & rhythm. No rubs, gallops or murmurs appreciated Lungs: clear Abdomen: soft, nontender, nondistended. No hepatosplenomegaly. No bruits or masses. Good bowel sounds. Extremities: no cyanosis, clubbing, rash, edema Neuro: alert & oriented x 3, cranial nerves grossly intact. moves all 4 extremities w/o difficulty. Affect pleasant.  EKG: SR 67 bpm PVCs.  ASSESSMENT & PLAN: 1. Chronic systolic HF: Nonischemic cardiomyopathy x years. ECHO 06/2014 EF 25%.  NYHA class II.  He has a Secondary school teacher ICD and is not a candidate for CRT due to narrow QRS.  7/16 LHC showed no significant CAD.  NYHA II.  Euvolemic on exam.   - Continue Lasix 40 mg bid - Continue current Coreg 25 mg twice a day.  - Continue Entresto 24/26 mg bid => does not have BP room to increase.  - Gynecomastia resolved off spironolactone.  Continue eplerenone 25 mg daily  - Check BMET/Mg today.  2.  Atrial fibrillation: Paroxysmal.  Holding NSR on Tikosyn.   - QTc ok, check BMET/Mg today.  - Continue Xarelto 20 mg daily. No bleeding problems.   Follow up in 3-4 months.   Amy Clegg NP-C  03/15/2015   Patient seen with NP, agree with the above note. He is stable clinically, NYHA class II symptoms and not volume overloaded.  He remains in NSR.  ECG was reviewed today, QTc acceptable for Tikosyn.  Check BMET/Mg level.  No medication titration today.   Followup in 3 months.   Marca Ancona 03/16/2015

## 2015-03-22 ENCOUNTER — Encounter (HOSPITAL_COMMUNITY): Payer: Commercial Managed Care - HMO

## 2015-03-22 ENCOUNTER — Telehealth (HOSPITAL_COMMUNITY): Payer: Self-pay | Admitting: Pharmacist

## 2015-03-22 NOTE — Telephone Encounter (Signed)
Entresto 24-26 mg PA approved by [facility] through 03/17/16. Patient may still use copay card so that cost is no more than $10/mo for him.   Tyler Deis. Bonnye Fava, PharmD, BCPS, CPP Clinical Pharmacist Pager: [phone] Phone: [phone] 03/22/2015 12:11 PM

## 2015-03-28 ENCOUNTER — Encounter: Payer: Self-pay | Admitting: Cardiology

## 2015-03-28 ENCOUNTER — Encounter (HOSPITAL_COMMUNITY): Payer: Self-pay

## 2015-03-29 ENCOUNTER — Other Ambulatory Visit (HOSPITAL_COMMUNITY): Payer: Self-pay | Admitting: *Deleted

## 2015-03-29 ENCOUNTER — Other Ambulatory Visit: Payer: Self-pay

## 2015-03-29 DIAGNOSIS — I4819 Other persistent atrial fibrillation: Secondary | ICD-10-CM

## 2015-03-29 MED ORDER — CARVEDILOL 25 MG PO TABS: 25.0000 mg | ORAL_TABLET | Freq: Two times a day (BID) | ORAL | Status: DC

## 2015-03-29 MED ORDER — FUROSEMIDE 40 MG PO TABS: 40.0000 mg | ORAL_TABLET | Freq: Two times a day (BID) | ORAL | Status: DC

## 2015-03-29 MED ORDER — EPLERENONE 25 MG PO TABS: 25.0000 mg | ORAL_TABLET | Freq: Every day | ORAL | Status: DC

## 2015-03-29 MED ORDER — RIVAROXABAN 20 MG PO TABS: 20.0000 mg | ORAL_TABLET | Freq: Every day | ORAL | Status: DC

## 2015-03-29 MED ORDER — DOFETILIDE 250 MCG PO CAPS: 250.0000 ug | ORAL_CAPSULE | Freq: Two times a day (BID) | ORAL | Status: DC

## 2015-03-29 MED ORDER — POTASSIUM CHLORIDE CRYS ER 20 MEQ PO TBCR: 20.0000 meq | EXTENDED_RELEASE_TABLET | Freq: Two times a day (BID) | ORAL | Status: DC

## 2015-04-01 ENCOUNTER — Telehealth: Payer: Self-pay | Admitting: Cardiology

## 2015-04-01 ENCOUNTER — Encounter: Payer: Self-pay | Admitting: Cardiology

## 2015-04-01 NOTE — Telephone Encounter (Signed)
Terry [name] called in stating that she would like to speak with some allergy and drug interactions between the pt's Furosemide and Potassium. Please f/u with her. She also stated that there will be a hold on these until she speaks with the doctor or the nurse.

## 2015-04-01 NOTE — Telephone Encounter (Signed)
[employer] called to inform Lasix will not be dispensed due to allergy they have listed to loop diuretics. Confirmed with pharmacist that patient is only allergic to spironolactone and sulfa.  She also confirmed patient is taking K, lasix and eplerenone. She was grateful for call.

## 2015-04-12 ENCOUNTER — Telehealth (HOSPITAL_COMMUNITY): Payer: Self-pay | Admitting: *Deleted

## 2015-04-12 NOTE — Telephone Encounter (Signed)
Per [facility] pt needs PA for his [employer]ice [employer], they will fax form, we can call [phone] to start PA, will send to [facility]

## 2015-04-13 NOTE — Progress Notes (Signed)
Cardiology Office Note   Date:  04/14/2015   ID:  Terry [name], DOB 24-Dec-1955, MRN [id number]  PCP:  [name], MD    Chief Complaint  Patient presents with  . Cardiomyopathy  . Congestive Heart Failure      History of Present Illness: Terry [name] is a 60 y.o. 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  He  had notable LV EF decline in the past six months despite guideline directed therapy and was eferred to AHF clinic for evaluation. Back in 2006, an echo showed EF 15-20%. LHC at that time showed normal coronaries. He has been managed for cardiomyopathy since that time. In 12/14, EF had improved to 50%. However, he has had a decline since then. Last echo in 5/16 showed EF 25%. Atrial fibrillation was first noted in 2013. In 7/15, he had DCCV to NSR. In 1/16, he was noted to be back in atrial fibrillation and has been in atrial fibrillation since that time. He had St Jude ICD placed by Dr [name] in 6/16. Given fall in EF, Lexiscan Cardiolite was done. This showed EF 39% with partially reversible inferior and apical perfusion defect. He had LHC in 7/16 showing no significant CAD. In 8/16, he was admitted for Tikosyn initiation and converted to NSR. He has been maintaining NSR since then.    He presents today for followup.  He denies any chest pain, SOB, DOE, LE edema, dizziness, palpitations or syncope.  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  He walks about 4 blocks without any problems daily.     Past Medical History  Diagnosis Date  . GERD (gastroesophageal reflux disease)   . H/O hiatal hernia   . Aortic insufficiency     mild   . Ejection fraction < 50%     by echo 08/25/11   . H/O hematuria   . Nonischemic dilated cardiomyopathy (HCC)     EF 45%  . Hypertension   . Aortic root dilatation (HCC)   . AICD  (automatic cardioverter/defibrillator) present   . Complication of anesthesia     hx of irregular beat after anesthesia in ~ 1990's  . Chronic systolic CHF (congestive heart failure), NYHA class 1 (HCC)   . Atrial fibrillation (HCC)     "off and on since 2013" (08/05/2014)  . Dysrhythmia   . Rheumatoid arthritis (HCC) dx'd 1995  . Anemia     "S/P knee & hip OR"  . History of blood transfusion 2013; 2014    S/P knee and hip OR  . 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 Nasal septum surgery  ~ 1975  . Urethral stricture dilatation  "several times"  . Foot neuroma surgery Right     related to benign tumor   . Colonoscopy  2010; 2015    "polyps; no polyps"  . Total hip arthroplasty  09/08/2011    Procedure: TOTAL HIP ARTHROPLASTY ANTERIOR APPROACH;  Surgeon: [name], MD;  Location: [facility];  Service: Orthopedics;  Laterality: Right;  Right total hip replacement, left knee steroid injection  . Total knee arthroplasty Left 11/29/2012    Procedure: LEFT TOTAL KNEE ARTHROPLASTY;  Surgeon:  [name], MD;  Location: [facility];  Service: Orthopedics;  Laterality: Left;  FEMORAL NERVE BLOCK IN HOLDING AREA LEFT LEG  . Cardiac catheterization  ~ 2004    normal  . Cardioversion N/A 08/21/2013    Procedure: CARDIOVERSION;  Surgeon: Quintella Reichert, MD;  Location: [facility] ENDOSCOPY;  Service: Cardiovascular;  Laterality: N/A;  . Cardiac defibrillator placement  08/05/2014    St. Jude  . Joint replacement    . Tonsillectomy    . Urethral fistula repair  ~ 1996  . Ep implantable device N/A 08/05/2014    Procedure: ICD Implant;  Surgeon: [name], MD;  Location: [facility] INVASIVE CV LAB;  Service: Cardiovascular;  Laterality: N/A;  . Cardiac catheterization N/A 09/07/2014    Procedure: Left Heart Cath and Coronary Angiography;  Surgeon: Laurey Morale, MD;  Location: [facility] INVASIVE CV LAB;  Service: Cardiovascular;   Laterality: N/A;     Current Outpatient Prescriptions  Medication Sig Dispense Refill  . carvedilol (COREG) 25 MG tablet Take 1 tablet (25 mg total) by mouth 2 (two) times daily. 180 tablet 3  . dofetilide (TIKOSYN) 250 MCG capsule Take 1 capsule (250 mcg total) by mouth 2 (two) times daily. 180 capsule 3  . ENTRESTO 24-26 MG TAKE 1 TABLET BY MOUTH 2 (TWO) TIMES DAILY. 60 tablet 3  . eplerenone (INSPRA) 25 MG tablet Take 1 tablet (25 mg total) by mouth daily. 90 tablet 3  . furosemide (LASIX) 40 MG tablet Take 1 tablet (40 mg total) by mouth 2 (two) times daily. 180 tablet 3  . potassium chloride SA (K-DUR,KLOR-CON) 20 MEQ tablet Take 1 tablet (20 mEq total) by mouth 2 (two) times daily. 180 tablet 3  . rivaroxaban (XARELTO) 20 MG TABS tablet Take 1 tablet (20 mg total) by mouth daily with supper. Resume on 08/07/14 30 tablet 9   No current facility-administered medications for this visit.    Allergies:   Spironolactone and Sulfa antibiotics    Social History:  The patient  reports that he has never smoked. He has never used smokeless tobacco. He reports that he drinks about 0.6 oz of alcohol per week. He reports that he does not use illicit drugs.   Family History:  The patient's family history includes Heart attack (age of onset: 51) in his father; Heart disease in his father; Heart failure in his brother.    ROS:  Please see the history of present illness.   Otherwise, review of systems are positive for none.   All other systems are reviewed and negative.    PHYSICAL EXAM: VS:  BP 98/58 mmHg  Pulse 64  Ht 5\' 11"  (1.803 m)  Wt 202 lb (91.627 kg)  BMI 28.19 kg/m2 , BMI Body mass index is 28.19 kg/(m^2). GEN: Well nourished, well developed, in no acute distress HEENT: normal Neck: no JVD, carotid bruits, or masses Cardiac: RRR; no murmurs, rubs, or gallops,no edema  Respiratory:  clear to auscultation bilaterally, normal work of breathing GI: soft, nontender, nondistended, +  BS MS: no deformity or atrophy Skin: warm and dry, no rash Neuro:  Strength and sensation are intact Psych: euthymic mood, full affect   EKG:  EKG is not ordered today.    Recent Labs: 09/07/2014: Hemoglobin 13.0; Platelets 162 03/15/2015: BUN 19; Creatinine, Ser 1.26*; Magnesium 2.1; Potassium 4.9; Sodium 140    Lipid Panel No results found for: CHOL, TRIG, HDL, CHOLHDL, VLDL, LDLCALC, LDLDIRECT    Wt Readings from Last 3  Encounters:  04/14/15 202 lb (91.627 kg)  03/15/15 204 lb (92.534 kg)  11/11/14 211 lb 8 oz (95.936 kg)     ASSESSMENT AND PLAN:  Persistent atrial fibrillation - Maintaining NSR on Tikosyn.  Continue Xarelto.  Mg and potassium stable in January.    Essential hypertension - controlled but soft  Chronic Systolic HLK:[id number] cardiomyopathy x years. ECHO 06/2014 EF 25%. NYHA class II. He has a Secondary school [employer] ICD and is not a candidate for CRT due to narrow QRS. 7/16 LHC showed no significant CAD. Euvolemic on exam. Continue beta-blocker, Eplerenone, Entresto.   Current medicines are reviewed at length with the patient today.  The patient has no concerns regarding medicines.  The following changes have been made:  no change  Labs/ tests ordered today: See above Assessment and Plan No orders of the defined types were placed in this encounter.     Disposition:   FU with me in 6 months  Signed, Quintella Reichert, MD  04/14/2015 3:41 PM    [facility] [address] [place], [place], [place]  [address] Phone: [phone]; Fax: [phone]

## 2015-04-14 ENCOUNTER — Ambulatory Visit (INDEPENDENT_AMBULATORY_CARE_PROVIDER_SITE_OTHER): Payer: BC Managed Care – PPO | Admitting: Cardiology

## 2015-04-14 ENCOUNTER — Encounter: Payer: Self-pay | Admitting: Cardiology

## 2015-04-14 VITALS — BP 98/58 | HR 64 | Ht 71.0 in | Wt 202.0 lb

## 2015-04-14 DIAGNOSIS — I4819 Other persistent atrial fibrillation: Secondary | ICD-10-CM

## 2015-04-14 DIAGNOSIS — I429 Cardiomyopathy, unspecified: Secondary | ICD-10-CM | POA: Diagnosis not present

## 2015-04-14 DIAGNOSIS — I5022 Chronic systolic (congestive) heart failure: Secondary | ICD-10-CM | POA: Diagnosis not present

## 2015-04-14 DIAGNOSIS — I420 Dilated cardiomyopathy: Secondary | ICD-10-CM

## 2015-04-14 DIAGNOSIS — I481 Persistent atrial fibrillation: Secondary | ICD-10-CM

## 2015-04-14 NOTE — Patient Instructions (Signed)

## 2015-04-21 ENCOUNTER — Encounter (HOSPITAL_COMMUNITY): Payer: Self-pay

## 2015-04-21 NOTE — Progress Notes (Signed)
[place] [facility] faxed medical record request for 02/2015-present. All available records from requested time frame pertaining to CHF providers/encounters faxed to provided # [phone]. Copy of request scanned into patient's electronic medical records. Case # [id number]  [employer]

## 2015-04-22 ENCOUNTER — Telehealth (HOSPITAL_COMMUNITY): Payer: Self-pay | Admitting: Pharmacist

## 2015-04-22 DIAGNOSIS — Z736 Limitation of activities due to disability: Secondary | ICD-10-CM

## 2015-04-22 NOTE — Telephone Encounter (Signed)
Dofetilide 250 mcg BID approved by [employer]BS through 04/21/2016.   Tyler Deis. Bonnye Fava, PharmD, BCPS, CPP Clinical Pharmacist Pager: [phone] Phone: [phone] 04/22/2015 3:22 PM

## 2015-05-04 ENCOUNTER — Telehealth: Payer: Self-pay | Admitting: Cardiology

## 2015-05-04 ENCOUNTER — Ambulatory Visit (INDEPENDENT_AMBULATORY_CARE_PROVIDER_SITE_OTHER): Payer: BC Managed Care – PPO | Admitting: *Deleted

## 2015-05-04 DIAGNOSIS — I429 Cardiomyopathy, unspecified: Secondary | ICD-10-CM

## 2015-05-04 DIAGNOSIS — I420 Dilated cardiomyopathy: Secondary | ICD-10-CM

## 2015-05-04 NOTE — Telephone Encounter (Signed)
Spoke with pt and reminded pt of remote transmission that is due today. Pt verbalized understanding.   

## 2015-05-05 NOTE — Progress Notes (Signed)
Remote ICD transmission.   

## 2015-05-13 DIAGNOSIS — Z736 Limitation of activities due to disability: Secondary | ICD-10-CM

## 2015-05-21 ENCOUNTER — Other Ambulatory Visit (HOSPITAL_COMMUNITY): Payer: Self-pay | Admitting: Internal Medicine

## 2015-05-24 ENCOUNTER — Other Ambulatory Visit (HOSPITAL_COMMUNITY): Payer: Self-pay | Admitting: *Deleted

## 2015-05-24 MED ORDER — SACUBITRIL-VALSARTAN 24-26 MG PO TABS: ORAL_TABLET | ORAL | Status: DC

## 2015-06-15 ENCOUNTER — Encounter: Payer: Self-pay | Admitting: Cardiology

## 2015-06-15 LAB — CUP PACEART REMOTE DEVICE CHECK
Battery Remaining Percentage: 93 %
Battery Voltage: 3.13 V
Brady Statistic RV Percent Paced: 1 %
Date Time Interrogation Session: 20170321151544
HIGH POWER IMPEDANCE MEASURED VALUE: 86 Ohm
HighPow Impedance: 86 Ohm
Implantable Lead Model: 181
Lead Channel Impedance Value: 510 Ohm
Lead Channel Setting Pacing Amplitude: 2.5 V
Lead Channel Setting Sensing Sensitivity: 0.5 mV
MDC IDC LEAD IMPLANT DT: 20160622
MDC IDC LEAD LOCATION: 753860
MDC IDC LEAD SERIAL: 331169
MDC IDC MSMT BATTERY REMAINING LONGEVITY: 96 mo
MDC IDC MSMT LEADCHNL RV SENSING INTR AMPL: 11.5 mV
MDC IDC SET LEADCHNL RV PACING PULSEWIDTH: 0.5 ms
Pulse Gen Serial Number: 7280026

## 2015-06-23 ENCOUNTER — Encounter: Payer: Self-pay | Admitting: Cardiology

## 2015-07-09 ENCOUNTER — Other Ambulatory Visit (HOSPITAL_COMMUNITY): Payer: Self-pay | Admitting: *Deleted

## 2015-07-09 ENCOUNTER — Encounter (HOSPITAL_COMMUNITY): Payer: Self-pay

## 2015-07-09 DIAGNOSIS — I4819 Other persistent atrial fibrillation: Secondary | ICD-10-CM

## 2015-07-09 MED ORDER — DOFETILIDE 250 MCG PO CAPS: 250.0000 ug | ORAL_CAPSULE | Freq: Two times a day (BID) | ORAL | Status: DC

## 2015-07-12 ENCOUNTER — Encounter (HOSPITAL_COMMUNITY): Payer: Self-pay

## 2015-07-13 ENCOUNTER — Telehealth (HOSPITAL_COMMUNITY): Payer: Self-pay | Admitting: *Deleted

## 2015-07-13 DIAGNOSIS — I4819 Other persistent atrial fibrillation: Secondary | ICD-10-CM

## 2015-07-13 MED ORDER — DOFETILIDE 250 MCG PO CAPS: 250.0000 ug | ORAL_CAPSULE | Freq: Two times a day (BID) | ORAL | Status: DC

## 2015-07-13 NOTE — Telephone Encounter (Signed)
Pt called to let us know he will not be able to afford the brand Tikosyn and wishes to continue the generic, order sent to [facility] for generic

## 2015-07-21 ENCOUNTER — Encounter: Payer: Self-pay | Admitting: Internal Medicine

## 2015-08-11 ENCOUNTER — Encounter: Payer: BC Managed Care – PPO | Admitting: Internal Medicine

## 2015-09-01 ENCOUNTER — Encounter: Payer: Self-pay | Admitting: Cardiology

## 2015-09-08 ENCOUNTER — Encounter: Payer: Self-pay | Admitting: *Deleted

## 2015-09-10 ENCOUNTER — Encounter: Payer: Self-pay | Admitting: Internal Medicine

## 2015-09-10 ENCOUNTER — Ambulatory Visit (INDEPENDENT_AMBULATORY_CARE_PROVIDER_SITE_OTHER): Payer: BC Managed Care – PPO | Admitting: Internal Medicine

## 2015-09-10 DIAGNOSIS — I5022 Chronic systolic (congestive) heart failure: Secondary | ICD-10-CM

## 2015-09-10 DIAGNOSIS — I420 Dilated cardiomyopathy: Secondary | ICD-10-CM

## 2015-09-10 DIAGNOSIS — I429 Cardiomyopathy, unspecified: Secondary | ICD-10-CM | POA: Diagnosis not present

## 2015-09-10 LAB — CUP PACEART INCLINIC DEVICE CHECK
HIGH POWER IMPEDANCE MEASURED VALUE: 84.375
Lead Channel Impedance Value: 450 Ohm
Lead Channel Pacing Threshold Amplitude: 0.5 V
Lead Channel Pacing Threshold Pulse Width: 0.5 ms
Lead Channel Sensing Intrinsic Amplitude: 11.5 mV
Lead Channel Setting Pacing Amplitude: 2.5 V
MDC IDC LEAD IMPLANT DT: 20160622
MDC IDC LEAD LOCATION: 753860
MDC IDC LEAD MODEL: 181
MDC IDC LEAD SERIAL: 331169
MDC IDC MSMT BATTERY REMAINING LONGEVITY: 96 mo
MDC IDC MSMT LEADCHNL RV PACING THRESHOLD AMPLITUDE: 0.5 V
MDC IDC MSMT LEADCHNL RV PACING THRESHOLD PULSEWIDTH: 0.5 ms
MDC IDC SESS DTM: 20170728103934
MDC IDC SET LEADCHNL RV PACING PULSEWIDTH: 0.5 ms
MDC IDC SET LEADCHNL RV SENSING SENSITIVITY: 0.5 mV
MDC IDC STAT BRADY RV PERCENT PACED: 0.13 %
Pulse Gen Serial Number: 7280026

## 2015-09-10 NOTE — Patient Instructions (Signed)

## 2015-09-10 NOTE — Progress Notes (Signed)
HPI [name] returns today for followup of his  ICD, implanted several months ago. He is a pleasant 60 yo man with atrial fibrillation, RA, s/p knee replacement and hip replacement, chronic systolic heart failure who has had progressively worsening symptoms over the past 6 months despite maximal medical therapy. He was cardioverted but maintained NSR only briefly. He did not know that he was in atrial fibrillation on presentation in the past. The patient has never had syncope. His EF is 25% by echo, previously 35%. He underwent ICD implant and was then placed on Tikosyn. He has returned to NSR. He feels better. He has returned from a trip to [place]. Allergies  Allergen Reactions  . Spironolactone     Gynecomastia  . Sulfa Antibiotics     Thrush and hives     Current Outpatient Prescriptions  Medication Sig Dispense Refill  . carvedilol (COREG) 25 MG tablet Take 1 tablet (25 mg total) by mouth 2 (two) times daily. 180 tablet 3  . dofetilide (TIKOSYN) 250 MCG capsule Take 1 capsule (250 mcg total) by mouth 2 (two) times daily. 60 capsule 6  . eplerenone (INSPRA) 25 MG tablet Take 1 tablet (25 mg total) by mouth daily. 90 tablet 3  . furosemide (LASIX) 40 MG tablet Take 1 tablet (40 mg total) by mouth 2 (two) times daily. 180 tablet 3  . potassium chloride SA (K-DUR,KLOR-CON) 20 MEQ tablet Take 1 tablet (20 mEq total) by mouth 2 (two) times daily. 180 tablet 3  . rivaroxaban (XARELTO) 20 MG TABS tablet Take 1 tablet (20 mg total) by mouth daily with supper. Resume on 08/07/14 30 tablet 9  . sacubitril-valsartan (ENTRESTO) 24-26 MG TAKE 1 TABLET BY MOUTH 2 (TWO) TIMES DAILY. 60 tablet 6   No current facility-administered medications for this visit.      Past Medical History:  Diagnosis Date  . AICD (automatic cardioverter/defibrillator) present   . Anemia    "S/P knee & hip OR"  . Aortic insufficiency    mild   . Aortic root dilatation (HCC)   . Atrial fibrillation (HCC)    "off and on since 2013" (08/05/2014)  . Chronic systolic CHF (congestive heart failure), NYHA class 1 (HCC)   . Complication of anesthesia    hx of irregular beat after anesthesia in ~ 1990's  . Dysrhythmia   . Ejection fraction < 50%    by echo 08/25/11   . GERD (gastroesophageal reflux disease)   . H/O hematuria   . H/O hiatal hernia   . 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 History of blood transfusion 2013; 2014   S/P knee and hip OR  . Hypertension   . 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 Rheumatoid arthritis (HCC) dx'd 1995    ROS:   All systems reviewed and negative except as noted in the HPI.   Past Surgical History:  Procedure Laterality Date  . CARDIAC CATHETERIZATION  ~ 2004   normal  . CARDIAC CATHETERIZATION N/A 09/07/2014   Procedure: Left Heart Cath and Coronary Angiography;  Surgeon: Laurey Morale, MD;  Location: [facility] INVASIVE CV LAB;  Service: Cardiovascular;  Laterality: N/A;  . CARDIAC DEFIBRILLATOR PLACEMENT  08/05/2014   St. Jude  . CARDIOVERSION N/A 08/21/2013   Procedure: CARDIOVERSION;  Surgeon: Quintella Reichert, MD;  Location: [facility] ENDOSCOPY;  Service: Cardiovascular;  Laterality: N/A;  . COLONOSCOPY  2010; 2015   "polyps;  no polyps"  . EP IMPLANTABLE DEVICE N/A 08/05/2014   Procedure: ICD Implant;  Surgeon: [name], MD;  Location: [facility] INVASIVE CV LAB;  Service: Cardiovascular;  Laterality: N/A;  . 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 JOINT REPLACEMENT    . NASAL SEPTUM SURGERY  ~ 1975  . TONSILLECTOMY    . TOTAL HIP ARTHROPLASTY  09/08/2011   Procedure: TOTAL HIP ARTHROPLASTY ANTERIOR APPROACH;  Surgeon: [name], MD;  Location: [facility];  Service: Orthopedics;  Laterality: Right;  Right total hip replacement, left knee steroid injection  . TOTAL KNEE ARTHROPLASTY Left 11/29/2012   Procedure: LEFT TOTAL KNEE ARTHROPLASTY;  Surgeon: [name], MD;  Location: [facility];  Service:  Orthopedics;  Laterality: Left;  FEMORAL NERVE BLOCK IN HOLDING AREA LEFT LEG  . URETHRAL FISTULA REPAIR  ~ 1996  . URETHRAL STRICTURE DILATATION  "several times"     Family History  Problem Relation Age of Onset  . Heart disease Father   . Heart attack Father 51  . Heart failure Brother      Social History   Social History  . Marital status: Married    Spouse name: N/A  . Number of children: N/A  . Years of education: N/A   Occupational History  . Not on file.   Social History Main Topics  . Smoking status: Never Smoker  . Smokeless tobacco: Never Used  . Alcohol use 0.6 oz/week    1 Cans of beer per week  . Drug use: No  . Sexual activity: Yes   Other Topics Concern  . Not on file   Social History Narrative  . No narrative on file     BP 94/62   Pulse (!) 55   Ht 5\' 11"  (1.803 m)   Wt 191 lb 6.4 oz (86.8 kg)   BMI 26.69 kg/m   Physical Exam:  Well appearing NAD HEENT: Unremarkable Neck:  No JVD, no thyromegally Lymphatics:  No adenopathy Back:  No CVA tenderness Lungs:  Clear with no wheezes HEART:  Regular rate rhythm, no murmurs, no rubs, no clicks Abd:  soft, positive bowel sounds, no organomegally, no rebound, no guarding Ext:  2 plus pulses, no edema, no cyanosis, no clubbing Skin:  No rashes no nodules Neuro:  CN II through XII intact, motor grossly intact  ICD - his St. Jude VVI ICD is working normally.   Assess/Plan: 1. Chronic systolic heart failure - his symptoms are class 2. No change in meds. 2. Atrial fib - he appears to be maintaining NSR on Tikosyn.  3. ICD - his St. Jude device is working normally. Will recheck in several months.  .D.

## 2015-09-14 ENCOUNTER — Encounter: Payer: Self-pay | Admitting: Internal Medicine

## 2015-09-27 NOTE — Progress Notes (Signed)
Cardiology Office Note    Date:  09/28/2015   ID:  Terry [name], DOB 1955-04-07, MRN [id number]  PCP:  Terry Blamer, MD  Cardiologist:  Terry Magic, MD   Chief Complaint  Patient presents with  . Congestive Heart Failure  . Hypertension  . Atrial Fibrillation    History of Present Illness:  Terry [name] is a 60 y.o. 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  He  had notable LV EF decline in the past six months despite guideline directed therapy and was eferred to AHF clinic for evaluation. Back in 2006, an echo showed EF 15-20%. LHC at that time showed normal coronaries. He has been managed for cardiomyopathy since that time. In 12/14, EF had improved to 50%. However, he has had a decline since then. Last echo in 5/16 showed EF 25%. Atrial fibrillation was first noted in 2013. In 7/15, he had DCCV to NSR. In 1/16, he was noted to be back in atrial fibrillation.  He had St Jude ICD placed by Terry [name] in 6/16. Given fall in EF, Lexiscan Cardiolite was done. This showed EF 39% with partially reversible inferior and apical perfusion defect. He had LHC in 7/16 showing no significant CAD. In 8/16, he was admitted for Tikosyn initiation and converted to NSR. He has been maintaining NSR since then.    He presents today for followup.  He denies any chest pain, SOB, DOE, dizziness, palpitations or syncope. His LE edema has significantly improved.   He works out in the yard trimming trees and doing yard work.  He went to [place] for 2 weeks and did well without any problems.     Past Medical History:  Diagnosis Date  . AICD (automatic cardioverter/defibrillator) present   . Anemia    "S/P knee & hip OR"  . Aortic insufficiency    mild   . Aortic root dilatation (HCC)   . Atrial fibrillation (HCC)    "off and on since 2013" (08/05/2014)  . Chronic systolic CHF (congestive heart failure), NYHA  class 1 (HCC)   . Complication of anesthesia    hx of irregular beat after anesthesia in ~ 1990's  . Dysrhythmia   . Ejection fraction < 50%    by echo 08/25/11   . GERD (gastroesophageal reflux disease)   . H/O hematuria   . H/O hiatal hernia   . 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 History of blood transfusion 2013; 2014   S/P knee and hip OR  . Hypertension   . 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 Rheumatoid arthritis (HCC) dx'd 1995    Past Surgical History:  Procedure Laterality Date  . CARDIAC CATHETERIZATION  ~ 2004   normal  . CARDIAC CATHETERIZATION N/A 09/07/2014   Procedure: Left Heart Cath and Coronary Angiography;  Surgeon: Terry Morale, MD;  Location: [facility] INVASIVE CV LAB;  Service: Cardiovascular;  Laterality: N/A;  . CARDIAC DEFIBRILLATOR PLACEMENT  08/05/2014   St. Jude  . CARDIOVERSION N/A 08/21/2013   Procedure: CARDIOVERSION;  Surgeon: Terry Reichert, MD;  Location: [facility] ENDOSCOPY;  Service: Cardiovascular;  Laterality: N/A;  . COLONOSCOPY  2010; 2015   "polyps; no polyps"  . EP IMPLANTABLE DEVICE N/A 08/05/2014   Procedure: ICD Implant;  Surgeon: Terry Maw, MD;  Location: [facility] INVASIVE CV LAB;  Service: Cardiovascular;  Laterality:  N/A;  . 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 JOINT REPLACEMENT    . NASAL SEPTUM SURGERY  ~ 1975  . TONSILLECTOMY    . TOTAL HIP ARTHROPLASTY  09/08/2011   Procedure: TOTAL HIP ARTHROPLASTY ANTERIOR APPROACH;  Surgeon: Terry Hitch, MD;  Location: [facility];  Service: Orthopedics;  Laterality: Right;  Right total hip replacement, left knee steroid injection  . TOTAL KNEE ARTHROPLASTY Left 11/29/2012   Procedure: LEFT TOTAL KNEE ARTHROPLASTY;  Surgeon: Terry Hitch, MD;  Location: [facility];  Service: Orthopedics;  Laterality: Left;  FEMORAL NERVE BLOCK IN HOLDING AREA LEFT LEG  . URETHRAL FISTULA REPAIR  ~ 1996  . URETHRAL STRICTURE DILATATION  "several times"     Current Medications: Outpatient Medications Prior to Visit  Medication Sig Dispense Refill  . carvedilol (COREG) 25 MG tablet Take 1 tablet (25 mg total) by mouth 2 (two) times daily. 180 tablet 3  . dofetilide (TIKOSYN) 250 MCG capsule Take 1 capsule (250 mcg total) by mouth 2 (two) times daily. 60 capsule 6  . eplerenone (INSPRA) 25 MG tablet Take 1 tablet (25 mg total) by mouth daily. 90 tablet 3  . furosemide (LASIX) 40 MG tablet Take 1 tablet (40 mg total) by mouth 2 (two) times daily. 180 tablet 3  . potassium chloride SA (K-DUR,KLOR-CON) 20 MEQ tablet Take 1 tablet (20 mEq total) by mouth 2 (two) times daily. 180 tablet 3  . rivaroxaban (XARELTO) 20 MG TABS tablet Take 1 tablet (20 mg total) by mouth daily with supper. Resume on 08/07/14 30 tablet 9  . sacubitril-valsartan (ENTRESTO) 24-26 MG TAKE 1 TABLET BY MOUTH 2 (TWO) TIMES DAILY. 60 tablet 6   No facility-administered medications prior to visit.      Allergies:   Spironolactone and Sulfa antibiotics   Social History   Social History  . Marital status: Married    Spouse name: N/A  . Number of children: N/A  . Years of education: N/A   Social History Main Topics  . Smoking status: Never Smoker  . Smokeless tobacco: Never Used  . Alcohol use 0.6 oz/week    1 Cans of beer per week  . Drug use: No  . Sexual activity: Yes   Other Topics Concern  . None   Social History Narrative  . None     Family History:  The patient's family history includes Heart attack (age of onset: 69) in his father; Heart disease in his father; Heart failure in his brother.   ROS:   Please see the history of present illness.    ROS All other systems reviewed and are negative.   PHYSICAL EXAM:   VS:  BP 110/60   Pulse 66   Ht 5\' 11"  (1.803 m)   Wt 193 lb (87.5 kg)   SpO2 99%   BMI 26.92 kg/m    GEN: Well nourished, well developed, in no acute distress  HEENT: normal  Neck: no JVD, carotid bruits, or masses Cardiac: RRR; no  murmurs, rubs, or gallops,no edema.  Intact distal pulses bilaterally.  Respiratory:  clear to auscultation bilaterally, normal work of breathing GI: soft, nontender, nondistended, + BS MS: no deformity or atrophy  Skin: warm and dry, no rash Neuro:  Alert and Oriented x 3, Strength and sensation are intact Psych: euthymic mood, full affect  Wt Readings from Last 3 Encounters:  09/28/15 193 lb (87.5 kg)  09/10/15 191 lb 6.4 oz (  86.8 kg)  04/14/15 202 lb (91.6 kg)      Studies/Labs Reviewed:   EKG:  EKG is not ordered today.    Recent Labs: 03/15/2015: BUN 19; Creatinine, Ser 1.26; Magnesium 2.1; Potassium 4.9; Sodium 140   Lipid Panel No results found for: CHOL, TRIG, HDL, CHOLHDL, VLDL, LDLCALC, LDLDIRECT  Additional studies/ records that were reviewed today include:  none    ASSESSMENT:    1. Chronic systolic heart failure (HCC)   2. Paroxysmal atrial fibrillation (HCC)   3. Essential hypertension   4. Aortic root dilatation (HCC)   5. Nonischemic dilated cardiomyopathy (HCC)      PLAN:  In order of problems listed above:  1. Chronic systolic CHF NYHA class II - he appears euvolemic on exam.  His weight is stable today and he ways himself daily at home and has been stable.  He is compliant with his meds.  He is [facility] in [facility] clinic.  COntinue Lasix/BB/Entresto and Eplerenone. 2. PAF - maintaining NSR on Tikosyn.  Continue Xarelto/Tikosyn/BB.  Check NOAC panel. 3. HTN - BP controlled on current medical regimen.  Continue BB and Entresto. 4. Aortic root and ascending aortic dilatation (30mm and 80mm respectively by cath 06/2014) - repeat echo to assess stability.  5. Nonischemic DCM - EF 25% by echo 1 year ago.  S/P AICD implant 2017 followed by Terry. Ladona [name].     Medication Adjustments/Labs and Tests Ordered: Current medicines are reviewed at length with the patient today.  Concerns regarding medicines are outlined above.  Medication changes, Labs and Tests ordered  today are listed in the Patient Instructions below.  There are no Patient Instructions on file for this visit.   Signed, Terry Magic, MD  09/28/2015 3:31 PM    [facility] [address] [place], [place], [place]  [address] Phone: [phone]; Fax: [phone]

## 2015-09-28 ENCOUNTER — Encounter: Payer: Self-pay | Admitting: Cardiology

## 2015-09-28 ENCOUNTER — Ambulatory Visit (INDEPENDENT_AMBULATORY_CARE_PROVIDER_SITE_OTHER): Payer: BC Managed Care – PPO | Admitting: Cardiology

## 2015-09-28 VITALS — BP 110/60 | HR 66 | Ht 71.0 in | Wt 193.0 lb

## 2015-09-28 DIAGNOSIS — I10 Essential (primary) hypertension: Secondary | ICD-10-CM

## 2015-09-28 DIAGNOSIS — I77810 Thoracic aortic ectasia: Secondary | ICD-10-CM | POA: Diagnosis not present

## 2015-09-28 DIAGNOSIS — I420 Dilated cardiomyopathy: Secondary | ICD-10-CM

## 2015-09-28 DIAGNOSIS — I5022 Chronic systolic (congestive) heart failure: Secondary | ICD-10-CM | POA: Diagnosis not present

## 2015-09-28 DIAGNOSIS — I480 Paroxysmal atrial fibrillation: Secondary | ICD-10-CM

## 2015-09-28 DIAGNOSIS — I429 Cardiomyopathy, unspecified: Secondary | ICD-10-CM

## 2015-09-28 NOTE — Patient Instructions (Signed)
Medication Instructions:  Your physician recommends that you continue on your current medications as directed. Please refer to the Current Medication list given to you today.   Labwork: TODAY: BMET, Mag, CBC  Testing/Procedures: Your physician has requested that you have an echocardiogram. Echocardiography is a painless test that uses sound waves to create images of your heart. It provides your doctor with information about the size and shape of your heart and how well your heart's chambers and valves are working. This procedure takes approximately one hour. There are no restrictions for this procedure.  Follow-Up: Your physician wants you to follow-up in: 6 months with Dr. Mayford Knife. You will receive a reminder letter in the mail two months in advance. If you don't receive a letter, please call our office to schedule the follow-up appointment.   Any Other Special Instructions Will Be Listed Below (If Applicable).     If you need a refill on your cardiac medications before your next appointment, please call your pharmacy.

## 2015-09-29 LAB — BASIC METABOLIC PANEL
BUN: 26 mg/dL — AB (ref 7–25)
CALCIUM: 9.2 mg/dL (ref 8.6–10.3)
CO2: 26 mmol/L (ref 20–31)
Chloride: 101 mmol/L (ref 98–110)
Creat: 1.58 mg/dL — ABNORMAL HIGH (ref 0.70–1.25)
GLUCOSE: 69 mg/dL (ref 65–99)
Potassium: 4.8 mmol/L (ref 3.5–5.3)
Sodium: 137 mmol/L (ref 135–146)

## 2015-09-29 LAB — CBC WITH DIFFERENTIAL/PLATELET
BASOS ABS: 77 {cells}/uL (ref 0–200)
BASOS PCT: 1 %
EOS ABS: 616 {cells}/uL — AB (ref 15–500)
EOS PCT: 8 %
HEMATOCRIT: 35.9 % — AB (ref 38.5–50.0)
Hemoglobin: 12.2 g/dL — ABNORMAL LOW (ref 13.2–17.1)
Lymphs Abs: 2387 cells/uL (ref 850–3900)
MCH: 29.8 pg (ref 27.0–33.0)
MCHC: 34 g/dL (ref 32.0–36.0)
MCV: 87.8 fL (ref 80.0–100.0)
MPV: 9.7 fL (ref 7.5–12.5)
Monocytes Absolute: 924 cells/uL (ref 200–950)
Monocytes Relative: 12 %
NEUTROS PCT: 48 %
Neutro Abs: 3696 cells/uL (ref 1500–7800)
Platelets: 251 10*3/uL (ref 140–400)
RBC: 4.09 MIL/uL — ABNORMAL LOW (ref 4.20–5.80)
RDW: 13.4 % (ref 11.0–15.0)
WBC: 7.7 10*3/uL (ref 3.8–10.8)

## 2015-09-29 LAB — MAGNESIUM: MAGNESIUM: 2.3 mg/dL (ref 1.5–2.5)

## 2015-09-29 NOTE — Telephone Encounter (Signed)
New Message  Pt expressed he have Echo tomorrow and to call insurance company [employer] for [employer] for authorization on that Echo for tomorrow.  Pt expressed the number for [phone].  Please follow up with pt. Thanks!

## 2015-09-30 ENCOUNTER — Ambulatory Visit (HOSPITAL_COMMUNITY): Payer: BC Managed Care – PPO | Attending: Cardiovascular Disease

## 2015-09-30 ENCOUNTER — Other Ambulatory Visit: Payer: Self-pay

## 2015-09-30 DIAGNOSIS — I4891 Unspecified atrial fibrillation: Secondary | ICD-10-CM | POA: Diagnosis not present

## 2015-09-30 DIAGNOSIS — I77810 Thoracic aortic ectasia: Secondary | ICD-10-CM | POA: Insufficient documentation

## 2015-09-30 DIAGNOSIS — I110 Hypertensive heart disease with heart failure: Secondary | ICD-10-CM | POA: Insufficient documentation

## 2015-09-30 DIAGNOSIS — I428 Other cardiomyopathies: Secondary | ICD-10-CM | POA: Insufficient documentation

## 2015-09-30 DIAGNOSIS — I509 Heart failure, unspecified: Secondary | ICD-10-CM | POA: Insufficient documentation

## 2015-09-30 DIAGNOSIS — I429 Cardiomyopathy, unspecified: Secondary | ICD-10-CM

## 2015-09-30 DIAGNOSIS — I420 Dilated cardiomyopathy: Secondary | ICD-10-CM

## 2015-09-30 DIAGNOSIS — I351 Nonrheumatic aortic (valve) insufficiency: Secondary | ICD-10-CM | POA: Diagnosis not present

## 2015-10-01 ENCOUNTER — Telehealth: Payer: Self-pay

## 2015-10-01 DIAGNOSIS — I420 Dilated cardiomyopathy: Secondary | ICD-10-CM

## 2015-10-01 DIAGNOSIS — I5022 Chronic systolic (congestive) heart failure: Secondary | ICD-10-CM

## 2015-10-01 DIAGNOSIS — I77810 Thoracic aortic ectasia: Secondary | ICD-10-CM

## 2015-10-01 NOTE — Telephone Encounter (Signed)
-----   Message from Quintella Reichert, MD sent at 09/30/2015  8:30 PM EDT ----- Echo showed mild LV dysfunction with EF 45-50% with mild MR - compared to prior echo, LVF has improved.  Mildly dilated aortic root which is stable - repeat echo in 1 year

## 2015-10-01 NOTE — Telephone Encounter (Signed)
Informed patient of results and verbal understanding expressed.  Repeat ECHO ordered to be scheduled in 1 year. Patient agrees with treatment plan. 

## 2015-10-05 ENCOUNTER — Encounter: Payer: Self-pay | Admitting: Cardiology

## 2015-10-05 ENCOUNTER — Other Ambulatory Visit: Payer: Self-pay | Admitting: *Deleted

## 2015-10-05 DIAGNOSIS — I10 Essential (primary) hypertension: Secondary | ICD-10-CM

## 2015-10-14 ENCOUNTER — Ambulatory Visit: Payer: Commercial Managed Care - HMO | Admitting: Cardiology

## 2015-10-19 ENCOUNTER — Other Ambulatory Visit: Payer: BC Managed Care – PPO | Admitting: *Deleted

## 2015-10-19 DIAGNOSIS — I10 Essential (primary) hypertension: Secondary | ICD-10-CM

## 2015-10-19 LAB — BASIC METABOLIC PANEL
BUN: 28 mg/dL — ABNORMAL HIGH (ref 7–25)
CHLORIDE: 101 mmol/L (ref 98–110)
CO2: 27 mmol/L (ref 20–31)
CREATININE: 1.27 mg/dL — AB (ref 0.70–1.25)
Calcium: 9.3 mg/dL (ref 8.6–10.3)
Glucose, Bld: 79 mg/dL (ref 65–99)
POTASSIUM: 4.8 mmol/L (ref 3.5–5.3)
Sodium: 137 mmol/L (ref 135–146)

## 2015-10-22 ENCOUNTER — Encounter: Payer: Self-pay | Admitting: Cardiology

## 2015-10-26 ENCOUNTER — Other Ambulatory Visit: Payer: Self-pay | Admitting: *Deleted

## 2015-10-26 ENCOUNTER — Encounter (HOSPITAL_COMMUNITY): Payer: Self-pay

## 2015-10-26 MED ORDER — RIVAROXABAN 20 MG PO TABS: 20.0000 mg | ORAL_TABLET | Freq: Every day | ORAL | 3 refills | Status: DC

## 2015-10-27 ENCOUNTER — Other Ambulatory Visit (HOSPITAL_COMMUNITY): Payer: Self-pay | Admitting: *Deleted

## 2015-10-27 MED ORDER — SACUBITRIL-VALSARTAN 24-26 MG PO TABS: ORAL_TABLET | ORAL | 12 refills | Status: DC

## 2015-12-04 ENCOUNTER — Encounter: Payer: Self-pay | Admitting: Cardiology

## 2015-12-06 ENCOUNTER — Other Ambulatory Visit: Payer: Self-pay | Admitting: *Deleted

## 2015-12-06 MED ORDER — RIVAROXABAN 20 MG PO TABS: 20.0000 mg | ORAL_TABLET | Freq: Every day | ORAL | 3 refills | Status: DC

## 2015-12-13 ENCOUNTER — Ambulatory Visit (INDEPENDENT_AMBULATORY_CARE_PROVIDER_SITE_OTHER): Payer: BC Managed Care – PPO | Admitting: *Deleted

## 2015-12-13 DIAGNOSIS — I429 Cardiomyopathy, unspecified: Secondary | ICD-10-CM | POA: Diagnosis not present

## 2015-12-13 DIAGNOSIS — I420 Dilated cardiomyopathy: Secondary | ICD-10-CM

## 2015-12-13 DIAGNOSIS — I5022 Chronic systolic (congestive) heart failure: Secondary | ICD-10-CM

## 2015-12-14 NOTE — Progress Notes (Signed)
Remote ICD transmission.   

## 2015-12-17 ENCOUNTER — Encounter: Payer: Self-pay | Admitting: Cardiology

## 2016-01-12 LAB — CUP PACEART REMOTE DEVICE CHECK
Battery Remaining Longevity: 92 mo
Battery Remaining Percentage: 88 %
Battery Voltage: 3.04 V
Date Time Interrogation Session: 20171030083658
HIGH POWER IMPEDANCE MEASURED VALUE: 77 Ohm
HighPow Impedance: 77 Ohm
Implantable Lead Location: 753860
Implantable Lead Model: 181
Implantable Lead Serial Number: 331169
Implantable Pulse Generator Implant Date: 20160622
Lead Channel Impedance Value: 430 Ohm
Lead Channel Sensing Intrinsic Amplitude: 9.2 mV
Lead Channel Setting Pacing Amplitude: 2.5 V
Lead Channel Setting Pacing Pulse Width: 0.5 ms
MDC IDC LEAD IMPLANT DT: 20160622
MDC IDC MSMT LEADCHNL RV PACING THRESHOLD AMPLITUDE: 0.5 V
MDC IDC MSMT LEADCHNL RV PACING THRESHOLD PULSEWIDTH: 0.5 ms
MDC IDC PG SERIAL: 7280026
MDC IDC SET LEADCHNL RV SENSING SENSITIVITY: 0.5 mV
MDC IDC STAT BRADY RV PERCENT PACED: 1 %

## 2016-01-17 ENCOUNTER — Encounter (HOSPITAL_COMMUNITY): Payer: Self-pay

## 2016-01-18 ENCOUNTER — Other Ambulatory Visit (HOSPITAL_COMMUNITY): Payer: Self-pay

## 2016-01-18 DIAGNOSIS — I4819 Other persistent atrial fibrillation: Secondary | ICD-10-CM

## 2016-01-18 MED ORDER — DOFETILIDE 250 MCG PO CAPS: 250.0000 ug | ORAL_CAPSULE | Freq: Two times a day (BID) | ORAL | 6 refills | Status: DC

## 2016-01-24 ENCOUNTER — Ambulatory Visit (HOSPITAL_COMMUNITY)
Admission: RE | Admit: 2016-01-24 | Discharge: 2016-01-24 | Disposition: A | Discharge status: Home / Self Care | Payer: BC Managed Care – PPO | Source: Ambulatory Visit | Attending: Cardiology | Admitting: Cardiology

## 2016-01-24 VITALS — BP 100/62 | HR 54 | Wt 196.0 lb

## 2016-01-24 DIAGNOSIS — I481 Persistent atrial fibrillation: Secondary | ICD-10-CM | POA: Insufficient documentation

## 2016-01-24 DIAGNOSIS — I420 Dilated cardiomyopathy: Secondary | ICD-10-CM | POA: Insufficient documentation

## 2016-01-24 DIAGNOSIS — Z7901 Long term (current) use of anticoagulants: Secondary | ICD-10-CM | POA: Diagnosis not present

## 2016-01-24 DIAGNOSIS — I428 Other cardiomyopathies: Secondary | ICD-10-CM | POA: Insufficient documentation

## 2016-01-24 DIAGNOSIS — M069 Rheumatoid arthritis, unspecified: Secondary | ICD-10-CM | POA: Insufficient documentation

## 2016-01-24 DIAGNOSIS — Z96659 Presence of unspecified artificial knee joint: Secondary | ICD-10-CM | POA: Diagnosis not present

## 2016-01-24 DIAGNOSIS — I5022 Chronic systolic (congestive) heart failure: Secondary | ICD-10-CM | POA: Insufficient documentation

## 2016-01-24 DIAGNOSIS — I493 Ventricular premature depolarization: Secondary | ICD-10-CM | POA: Insufficient documentation

## 2016-01-24 DIAGNOSIS — I480 Paroxysmal atrial fibrillation: Secondary | ICD-10-CM | POA: Insufficient documentation

## 2016-01-24 DIAGNOSIS — I110 Hypertensive heart disease with heart failure: Secondary | ICD-10-CM | POA: Diagnosis not present

## 2016-01-24 DIAGNOSIS — Z96649 Presence of unspecified artificial hip joint: Secondary | ICD-10-CM | POA: Diagnosis not present

## 2016-01-24 DIAGNOSIS — Z95810 Presence of automatic (implantable) cardiac defibrillator: Secondary | ICD-10-CM | POA: Diagnosis not present

## 2016-01-24 DIAGNOSIS — I4891 Unspecified atrial fibrillation: Secondary | ICD-10-CM

## 2016-01-24 DIAGNOSIS — N62 Hypertrophy of breast: Secondary | ICD-10-CM | POA: Insufficient documentation

## 2016-01-24 LAB — BASIC METABOLIC PANEL
Anion gap: 8 (ref 5–15)
BUN: 26 mg/dL — AB (ref 6–20)
CHLORIDE: 99 mmol/L — AB (ref 101–111)
CO2: 29 mmol/L (ref 22–32)
Calcium: 9.4 mg/dL (ref 8.9–10.3)
Creatinine, Ser: 1.4 mg/dL — ABNORMAL HIGH (ref 0.61–1.24)
GFR calc Af Amer: >60 mL/min (ref >60)
GFR calc non Af Amer: 53 mL/min — ABNORMAL LOW (ref >60)
GLUCOSE: 95 mg/dL (ref 65–99)
POTASSIUM: 4.5 mmol/L (ref 3.5–5.1)
Sodium: 136 mmol/L (ref 135–145)

## 2016-01-24 LAB — MAGNESIUM: Magnesium: 2.2 mg/dL (ref 1.7–2.4)

## 2016-01-24 LAB — BRAIN NATRIURETIC PEPTIDE: B Natriuretic Peptide: 56.4 pg/mL (ref 0.0–100.0)

## 2016-01-24 NOTE — Progress Notes (Signed)
Patient ID: Terry [name], male   DOB: Oct 14, 1955, 60 y.o.   MRN: [id number] Primary Care: [name], MD Primary Cardiologist: Armanda Magic, MD Primary HF Cardiologist: Dr [name]  HPI: Terry [name] is a 60 y.o. 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 He presents as a new patient to the clinic today. He has had notable LV EF decline in the past six months despite guideline directed therapy by Dr. Armanda Magic and has been referred to [place] for evaluation.  Back in 2006, an echo showed EF 15-20%.  LHC at that time showed normal coronaries.  He has been managed for cardiomyopathy since that time. In 12/14, EF had improved to 50%.  However, he has had a decline since then.  Last echo in 5/16 showed EF 25%.  Atrial fibrillation was first noted in 2013.  In 7/15, he had DCCV to NSR.  In 1/16, he was noted to be back in atrial fibrillation and appears to have been in atrial fibrillation since that time. He had St Jude ICD placed by Dr [name] in 6/16.  Given fall in EF, Lexiscan Cardiolite was done. This showed EF 39% with partially reversible inferior and apical perfusion defect. He had LHC in 7/16 showing no significant CAD.  In 8/16, he was admitted for Tikosyn initiation and converted to NSR.   He returns today for regular follow up. Last seen in HF clininc in January. Has been following up with Cardiology regularly. Seen by Dr. Mayford Knife in August. Has been doing Disability since October, so mostly at home now. Has been travelling. Spent 2 weeks in [place] earlier this year. Denies any problems breathing.  Has last about 20 lbs over the past year. Suits and pants have gone down a size. Weight at home 190. Taking all medications as directed. He remains in NSR today.  Denies any SOB at all.   EKG: Sinus Rhythm with frequent PVCs, 57 bpm  HIV negative, ferritin normal, immunofixation with no monoclonal protein.  Labs (6/16):  K 4.2, Creatinine 1.08, HCT 37.5 Labs (08/18/14): K 4.2, Creatinine 1.0 Labs (8/16): K 4.7, creatinine 1.18 Labs (9/16): Mg 2.1, creatinine 1.16, K 4.6  PMH 1. Nonischemic dilated cardiomyopathy: Echo (2006) with EF 15-20%.  LHC in 10/06 showed normal coronaries.  By 2014, EF had improved to 50%.  Echo in 1/16 showed EF 35-40%.  Echo (5/16) showed EF 25% with mild MR.  St Jude ICD 6/16.  HIV negative, immunofixation with no monoclonal protein, and ferritin normal.  Lexiscan Cardiolite (7/16) with EF 39% and partially reversible inferior and apical perfusion defect (intermediate risk).   LHC (7/16) with EF 35-40%, no significant CAD.  2. Atrial fibrillation: Paroxysmal.  First noted in 2013.  DCCV in 7/15.  Back in atrial fibrillation 1/16.  Converted back to NSR with Tikosyn in 8/16.  3. HTN 4. Rheumatoid Arthritis: Has been off all medications for several years.   5. H/o TKR, h/o THR 6. Gynecomastia with spironolactone.   SH: Lives at home with wife and 2 children and a grandchild. Never smoker, drinks 1 or 2 beers a week with dinner. 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   FH: Father with MI at 75, brother with atrial fibrillation, mother with renal failure.   ROS: All systems reviewed and negative except as per HPI.   Current Outpatient Prescriptions  Medication Sig Dispense Refill  .  carvedilol (COREG) 25 MG tablet Take 1 tablet (25 mg total) by mouth 2 (two) times daily. 180 tablet 3  . dofetilide (TIKOSYN) 250 MCG capsule Take 1 capsule (250 mcg total) by mouth 2 (two) times daily. 60 capsule 6  . eplerenone (INSPRA) 25 MG tablet Take 1 tablet (25 mg total) by mouth daily. 90 tablet 3  . furosemide (LASIX) 40 MG tablet Take 1 tablet (40 mg total) by mouth 2 (two) times daily. 180 tablet 3  . potassium chloride SA (K-DUR,KLOR-CON) 20 MEQ tablet Take 1 tablet (20 mEq total) by mouth 2 (two) times daily. 180 tablet 3  . rivaroxaban (XARELTO) 20 MG TABS tablet Take 1  tablet (20 mg total) by mouth daily with supper. 90 tablet 3  . sacubitril-valsartan (ENTRESTO) 24-26 MG TAKE 1 TABLET BY MOUTH 2 (TWO) TIMES DAILY. 60 tablet 12   No current facility-administered medications for this encounter.     Allergies  Allergen Reactions  . Spironolactone     Gynecomastia  . Sulfa Antibiotics     Thrush and hives   BP 100/62   Pulse (!) 54   Wt 196 lb (88.9 kg)   SpO2 99%   BMI 27.34 kg/m    Wt Readings from Last 3 Encounters:  01/24/16 196 lb (88.9 kg)  09/28/15 193 lb (87.5 kg)  09/10/15 191 lb 6.4 oz (86.8 kg)    PHYSICAL EXAM: General:  WDWN. NAD.  HEENT: Normal Neck: supple. no JVD. Carotids 2+ bilat; no bruits. No thyromegaly or nodule noted.  Cor: PMI nondisplaced. RRR. No M/G/R noted.  Lungs: CTAB, normal effort.  Abdomen: soft, NT, ND, no HSM. No bruits or masses. +BS  Extremities: no cyanosis, clubbing, rash. No peripheral edema.  Neuro: alert & oriented x 3, cranial nerves grossly intact. moves all 4 extremities w/o difficulty. Affect pleasant.  EKG: 57 bpm, Sinus bradycardia with frequent PVCs. QTc 471  ASSESSMENT & PLAN: 1. Chronic systolic HF: Nonischemic cardiomyopathy x years. ECHO 06/2014 EF 25%.  NYHA class II.  He has a Secondary school teacher ICD and is not a candidate for CRT due to narrow QRS.  7/16 LHC showed no significant CAD.  NYHA II.  Euvolemic on exam.   - Echo 09/30/2015 LVEF 45-50%, Mild MR, Mild LAE, Mild RVH - Continue Lasix 40 mg bid - Continue current Coreg 25 mg twice a day.  - Continue Entresto 24/26 mg bid => does not have BP room to increase.  - Gynecomastia resolved off spironolactone.  Continue eplerenone 25 mg daily  - Check BMET/Mg today.  2.  Atrial fibrillation: Paroxysmal.  Holding NSR on Tikosyn.   - QTc stable on EKG - Check BMET/Mg today.   - Continue Xarelto 20 mg daily. No bleeding problems.   Doing great. Following up with multiple cardiologists, 3 including EP. Will stagger visits.  Will see him in ~6 months  unless he has any symptoms sooner.   Mariam Dollar Tillery PA-C 01/24/2016

## 2016-01-24 NOTE — Patient Instructions (Addendum)
Labs today (will call for abnormal results, otherwise no news is good news)  Follow up with 5-6 months

## 2016-01-31 ENCOUNTER — Other Ambulatory Visit: Payer: Self-pay | Admitting: Internal Medicine

## 2016-02-14 ENCOUNTER — Encounter: Payer: Self-pay | Admitting: Cardiology

## 2016-03-13 ENCOUNTER — Ambulatory Visit (INDEPENDENT_AMBULATORY_CARE_PROVIDER_SITE_OTHER): Payer: BC Managed Care – PPO | Admitting: *Deleted

## 2016-03-13 DIAGNOSIS — I429 Cardiomyopathy, unspecified: Secondary | ICD-10-CM

## 2016-03-13 DIAGNOSIS — I420 Dilated cardiomyopathy: Secondary | ICD-10-CM

## 2016-03-13 NOTE — Progress Notes (Signed)
Remote ICD transmission.   

## 2016-03-14 ENCOUNTER — Encounter: Payer: Self-pay | Admitting: Cardiology

## 2016-03-17 LAB — CUP PACEART REMOTE DEVICE CHECK
Date Time Interrogation Session: 20180129070017
HIGH POWER IMPEDANCE MEASURED VALUE: 79 Ohm
HighPow Impedance: 79 Ohm
Implantable Lead Location: 753860
Implantable Pulse Generator Implant Date: 20160622
Lead Channel Impedance Value: 410 Ohm
Lead Channel Pacing Threshold Pulse Width: 0.5 ms
Lead Channel Setting Pacing Amplitude: 2.5 V
Lead Channel Setting Pacing Pulse Width: 0.5 ms
MDC IDC LEAD IMPLANT DT: 20160622
MDC IDC LEAD SERIAL: 331169
MDC IDC MSMT BATTERY REMAINING LONGEVITY: 89 mo
MDC IDC MSMT BATTERY REMAINING PERCENTAGE: 86 %
MDC IDC MSMT BATTERY VOLTAGE: 3.02 V
MDC IDC MSMT LEADCHNL RV PACING THRESHOLD AMPLITUDE: 0.5 V
MDC IDC MSMT LEADCHNL RV SENSING INTR AMPL: 9 mV
MDC IDC SET LEADCHNL RV SENSING SENSITIVITY: 0.5 mV
MDC IDC STAT BRADY RV PERCENT PACED: 1 %
Pulse Gen Serial Number: 7280026

## 2016-03-22 ENCOUNTER — Other Ambulatory Visit (HOSPITAL_COMMUNITY): Payer: Self-pay | Admitting: Pharmacist

## 2016-03-22 MED ORDER — DOFETILIDE 250 MCG PO CAPS: 250.0000 ug | ORAL_CAPSULE | Freq: Two times a day (BID) | ORAL | 11 refills | Status: DC

## 2016-03-23 ENCOUNTER — Encounter: Payer: Self-pay | Admitting: Internal Medicine

## 2016-03-24 ENCOUNTER — Encounter: Payer: Self-pay | Admitting: Cardiology

## 2016-03-27 ENCOUNTER — Telehealth (HOSPITAL_COMMUNITY): Payer: Self-pay | Admitting: Pharmacist

## 2016-03-27 NOTE — Telephone Encounter (Signed)
Dofetilide 250 mcg BID PA approved by [facility] through 03/27/17.   Tyler Deis. Bonnye Fava, PharmD, BCPS, CPP Clinical Pharmacist Pager: [phone] Phone: [phone] 03/27/2016 3:25 PM

## 2016-03-29 ENCOUNTER — Ambulatory Visit (INDEPENDENT_AMBULATORY_CARE_PROVIDER_SITE_OTHER): Payer: BC Managed Care – PPO | Admitting: Cardiology

## 2016-03-29 ENCOUNTER — Encounter: Payer: Self-pay | Admitting: Cardiology

## 2016-03-29 VITALS — BP 104/60 | HR 50 | Ht 71.0 in | Wt 199.0 lb

## 2016-03-29 DIAGNOSIS — I429 Cardiomyopathy, unspecified: Secondary | ICD-10-CM | POA: Diagnosis not present

## 2016-03-29 DIAGNOSIS — I5022 Chronic systolic (congestive) heart failure: Secondary | ICD-10-CM

## 2016-03-29 DIAGNOSIS — I4819 Other persistent atrial fibrillation: Secondary | ICD-10-CM

## 2016-03-29 DIAGNOSIS — I10 Essential (primary) hypertension: Secondary | ICD-10-CM | POA: Diagnosis not present

## 2016-03-29 DIAGNOSIS — I77810 Thoracic aortic ectasia: Secondary | ICD-10-CM

## 2016-03-29 DIAGNOSIS — I420 Dilated cardiomyopathy: Secondary | ICD-10-CM

## 2016-03-29 DIAGNOSIS — I481 Persistent atrial fibrillation: Secondary | ICD-10-CM | POA: Diagnosis not present

## 2016-03-29 NOTE — Progress Notes (Signed)
Cardiology Office Note    Date:  03/29/2016   ID:  Terry [name], DOB 07-Mar-1955, MRN [id number]  PCP:  [name], MD  Cardiologist:  Armanda Magic, MD   Chief Complaint  Patient presents with  . Hypertension  . Atrial Fibrillation  . Cardiomyopathy    History of Present Illness:  Terry [name] is a 61 y.o. 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 He had notable LV EF decline despite guideline directed therapy and was referred to AHF clinic for evaluation. Back in 2006, an echo showed EF 15-20%. LHC at that time showed normal coronaries. He has been managed for cardiomyopathy since that time. Atrial fibrillation was first noted in 2013. In 7/15, he had DCCV to NSR. In 1/16, he was noted to be back in atrial fibrillation.  He had St Jude ICD placed by Dr [name] in 6/16. Given fall in EF, Lexiscan Cardiolite was done. This showed EF 39% with partially reversible inferior and apical perfusion defect. He had LHC in 7/16 showing no significant CAD. In 8/16, he was admitted for Tikosyn initiation and converted to NSR. He has been maintaining NSR since then.   He presents today for followup. He denies any chest pain, SOB, DOE, dizziness, palpitations or syncope. His LE edema has significantly improved.  He works out in the yard trimming trees and doing yard work.   Past Medical History:  Diagnosis Date  . AICD (automatic cardioverter/defibrillator) present   . Anemia    "S/P knee & hip OR"  . Aortic insufficiency    mild   . Aortic root dilatation (HCC)   . Atrial fibrillation (HCC)    "off and on since 2013" (08/05/2014)  . Chronic systolic CHF (congestive heart failure), NYHA class 1 (HCC)   . Complication of anesthesia    hx of irregular beat after anesthesia in ~ 1990's  . Dysrhythmia   . Ejection fraction < 50%    by echo 08/25/11   . GERD (gastroesophageal reflux disease)   . H/O  hematuria   . H/O hiatal hernia   . 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 History of blood transfusion 2013; 2014   S/P knee and hip OR  . Hypertension   . 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 Rheumatoid arthritis (HCC) dx'd 1995    Past Surgical History:  Procedure Laterality Date  . CARDIAC CATHETERIZATION  ~ 2004   normal  . CARDIAC CATHETERIZATION N/A 09/07/2014   Procedure: Left Heart Cath and Coronary Angiography;  Surgeon: Laurey Morale, MD;  Location: [facility] INVASIVE CV LAB;  Service: Cardiovascular;  Laterality: N/A;  . CARDIAC DEFIBRILLATOR PLACEMENT  08/05/2014   St. Jude  . CARDIOVERSION N/A 08/21/2013   Procedure: CARDIOVERSION;  Surgeon: Quintella Reichert, MD;  Location: [facility] ENDOSCOPY;  Service: Cardiovascular;  Laterality: N/A;  . COLONOSCOPY  2010; 2015   "polyps; no polyps"  . EP IMPLANTABLE DEVICE N/A 08/05/2014   Procedure: ICD Implant;  Surgeon: [name], MD;  Location: [facility] INVASIVE CV LAB;  Service: Cardiovascular;  Laterality: N/A;  . 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 JOINT REPLACEMENT    . NASAL SEPTUM SURGERY  ~ 1975  . TONSILLECTOMY    . TOTAL HIP ARTHROPLASTY  09/08/2011   Procedure: TOTAL HIP ARTHROPLASTY ANTERIOR APPROACH;  Surgeon: [name], MD;  Location: [facility] ORS;  Service: Orthopedics;  Laterality: Right;  Right total hip replacement, left knee steroid injection  . TOTAL KNEE ARTHROPLASTY Left 11/29/2012   Procedure: LEFT TOTAL KNEE ARTHROPLASTY;  Surgeon: [name], MD;  Location: [facility] OR[facility];  Service: Orthopedics;  Laterality: Left;  FEMORAL NERVE BLOCK IN HOLDING AREA LEFT LEG  . URETHRAL FISTULA REPAIR  ~ 1996  . URETHRAL STRICTURE DILATATION  "several times"    Current Medications: Current Meds  Medication Sig  . amoxicillin (AMOXIL) 875 MG tablet Take 1 tablet by mouth 2 (two) times daily.  . carvedilol (COREG) 25 MG tablet Take 1 tablet (25 mg total) by mouth 2 (two)  times daily.  Marland Kitchen dofetilide (TIKOSYN) 250 MCG capsule Take 1 capsule (250 mcg total) by mouth 2 (two) times daily.  Marland Kitchen eplerenone (INSPRA) 25 MG tablet Take 1 tablet (25 mg total) by mouth daily.  . furosemide (LASIX) 40 MG tablet Take 1 tablet (40 mg total) by mouth 2 (two) times daily.  . potassium chloride SA (K-DUR,KLOR-CON) 20 MEQ tablet Take 1 tablet (20 mEq total) by mouth 2 (two) times daily.  . rivaroxaban (XARELTO) 20 MG TABS tablet Take 1 tablet (20 mg total) by mouth daily with supper.  . sacubitril-valsartan (ENTRESTO) 24-26 MG TAKE 1 TABLET BY MOUTH 2 (TWO) TIMES DAILY.    Allergies:   Spironolactone and Sulfa antibiotics   Social History   Social History  . Marital status: Married    Spouse name: N/A  . Number of children: N/A  . Years of education: N/A   Social History Main Topics  . Smoking status: Never Smoker  . Smokeless tobacco: Never Used  . Alcohol use 0.6 oz/week    1 Cans of beer per week  . Drug use: No  . Sexual activity: Yes   Other Topics Concern  . None   Social History Narrative  . None     Family History:  The patient's family history includes Heart attack (age of onset: 18) in his father; Heart disease in his father; Heart failure in his brother.   ROS:   Please see the history of present illness.    ROS All other systems reviewed and are negative.  No flowsheet data found.     PHYSICAL EXAM:   VS:  BP 104/60   Pulse (!) 50   Ht 5\' 11"  (1.803 m)   Wt 199 lb (90.3 kg)   BMI 27.75 kg/m    GEN: Well nourished, well developed, in no acute distress  HEENT: normal  Neck: no JVD, carotid bruits, or masses Cardiac: RRR; no murmurs, rubs, or gallops,no edema.  Intact distal pulses bilaterally.  Respiratory:  clear to auscultation bilaterally, normal work of breathing GI: soft, nontender, nondistended, + BS MS: no deformity or atrophy  Skin: warm and dry, no rash Neuro:  Alert and Oriented x 3, Strength and sensation are intact Psych:  euthymic mood, full affect  Wt Readings from Last 3 Encounters:  03/29/16 199 lb (90.3 kg)  01/24/16 196 lb (88.9 kg)  09/28/15 193 lb (87.5 kg)      Studies/Labs Reviewed:   EKG:  EKG is not ordered today.    Recent Labs: 09/28/2015: Hemoglobin 12.2; Platelets 251 01/24/2016: B Natriuretic Peptide 56.4; BUN 26; Creatinine, Ser 1.40; Magnesium 2.2; Potassium 4.5; Sodium 136   Lipid Panel No results found for: CHOL, TRIG, HDL, CHOLHDL, VLDL, LDLCALC, LDLDIRECT  Additional studies/ records that were  reviewed today include:  none    ASSESSMENT:    1. Nonischemic dilated cardiomyopathy (HCC)   2. Essential hypertension   3. Persistent atrial fibrillation (HCC)   4. Chronic systolic heart failure (HCC)   5. Aortic root dilatation (HCC)      PLAN:  In order of problems listed above:  1. Nonischemic DCM EF 40-45% on echo 09/2015. 2. HTN - BP controlled on current meds.  He will continue on BB/Inspra and ARB. 3. Persistent atrial fibrillation maintaining NSR. He will continue on Tikosyn, BB and Xarelto.  Check CBC. 4. Chronic systolic CHF - he appears euvolemic on exam.  Weight is stable.  He will continue on Entresto/Inspra/diuretic and Coreg.  BMET on 01/2016 was stable. 5. Ascending aortic dilatation - 76mm by echo 09/2015.  Repeat echo 09/2016.     Medication Adjustments/Labs and Tests Ordered: Current medicines are reviewed at length with the patient today.  Concerns regarding medicines are outlined above.  Medication changes, Labs and Tests ordered today are listed in the Patient Instructions below.  There are no Patient Instructions on file for this visit.   Signed, Armanda Magic, MD  03/29/2016 2:36 PM    [facility] [address] [place], [place], [place]  [address] Phone: [phone]; Fax: [phone]

## 2016-03-29 NOTE — Patient Instructions (Signed)
Medication Instructions:  Your physician recommends that you continue on your current medications as directed. Please refer to the Current Medication list given to you today.   Labwork: TODAY: CBC  Testing/Procedures: Your physician has requested that you have an echocardiogram in August, 2018. Echocardiography is a painless test that uses sound waves to create images of your heart. It provides your doctor with information about the size and shape of your heart and how well your heart's chambers and valves are working. This procedure takes approximately one hour. There are no restrictions for this procedure.  Follow-Up: Your physician wants you to follow-up in: 6 months with Dr. Norris Cross assistant. You will receive a reminder letter in the mail two months in advance. If you don't receive a letter, please call our office to schedule the follow-up appointment.   Your physician wants you to follow-up in: 1 year with Dr. Mayford Knife. You will receive a reminder letter in the mail two months in advance. If you don't receive a letter, please call our office to schedule the follow-up appointment.   Any Other Special Instructions Will Be Listed Below (If Applicable).     If you need a refill on your cardiac medications before your next appointment, please call your pharmacy.

## 2016-03-30 LAB — CBC WITH DIFFERENTIAL/PLATELET
Basophils Absolute: 0 10*3/uL (ref 0.0–0.2)
Basos: 0 %
EOS (ABSOLUTE): 0.9 10*3/uL — ABNORMAL HIGH (ref 0.0–0.4)
EOS: 10 %
HEMOGLOBIN: 12.1 g/dL — AB (ref 13.0–17.7)
Hematocrit: 36.7 % — ABNORMAL LOW (ref 37.5–51.0)
IMMATURE GRANS (ABS): 0 10*3/uL (ref 0.0–0.1)
IMMATURE GRANULOCYTES: 0 %
Lymphocytes Absolute: 2.3 10*3/uL (ref 0.7–3.1)
Lymphs: 25 %
MCH: 27.8 pg (ref 26.6–33.0)
MCHC: 33 g/dL (ref 31.5–35.7)
MCV: 84 fL (ref 79–97)
MONOCYTES: 10 %
Monocytes Absolute: 1 10*3/uL — ABNORMAL HIGH (ref 0.1–0.9)
NEUTROS PCT: 55 %
Neutrophils Absolute: 5.1 10*3/uL (ref 1.4–7.0)
Platelets: 278 10*3/uL (ref 150–379)
RBC: 4.35 x10E6/uL (ref 4.14–5.80)
RDW: 13.6 % (ref 12.3–15.4)
WBC: 9.4 10*3/uL (ref 3.4–10.8)

## 2016-04-04 ENCOUNTER — Telehealth (HOSPITAL_COMMUNITY): Payer: Self-pay | Admitting: Pharmacist

## 2016-04-04 ENCOUNTER — Encounter: Payer: Self-pay | Admitting: Cardiology

## 2016-04-04 NOTE — Telephone Encounter (Signed)
Entresto 24-26 mg BID PA approved by [facility] [employer] through 04/04/17.   Tyler Deis. Bonnye Fava, PharmD, BCPS, CPP Clinical Pharmacist Pager: [phone] Phone: [phone] 04/04/2016 4:14 PM

## 2016-05-17 ENCOUNTER — Encounter: Payer: Self-pay | Admitting: Cardiology

## 2016-05-17 ENCOUNTER — Other Ambulatory Visit: Payer: Self-pay | Admitting: *Deleted

## 2016-05-17 MED ORDER — EPLERENONE 25 MG PO TABS: 25.0000 mg | ORAL_TABLET | Freq: Every day | ORAL | 3 refills | Status: DC

## 2016-05-17 MED ORDER — FUROSEMIDE 40 MG PO TABS: 40.0000 mg | ORAL_TABLET | Freq: Two times a day (BID) | ORAL | 3 refills | Status: DC

## 2016-05-17 MED ORDER — POTASSIUM CHLORIDE CRYS ER 20 MEQ PO TBCR: 20.0000 meq | EXTENDED_RELEASE_TABLET | Freq: Two times a day (BID) | ORAL | 3 refills | Status: DC

## 2016-05-17 MED ORDER — CARVEDILOL 25 MG PO TABS: 25.0000 mg | ORAL_TABLET | Freq: Two times a day (BID) | ORAL | 3 refills | Status: DC

## 2016-06-01 ENCOUNTER — Other Ambulatory Visit: Payer: Self-pay | Admitting: Cardiology

## 2016-06-12 ENCOUNTER — Ambulatory Visit (INDEPENDENT_AMBULATORY_CARE_PROVIDER_SITE_OTHER): Payer: BC Managed Care – PPO | Admitting: *Deleted

## 2016-06-12 DIAGNOSIS — I5022 Chronic systolic (congestive) heart failure: Secondary | ICD-10-CM

## 2016-06-12 DIAGNOSIS — I420 Dilated cardiomyopathy: Secondary | ICD-10-CM | POA: Diagnosis not present

## 2016-06-12 NOTE — Progress Notes (Signed)
Remote ICD transmission.   

## 2016-06-13 ENCOUNTER — Encounter: Payer: Self-pay | Admitting: Cardiology

## 2016-06-13 LAB — CUP PACEART REMOTE DEVICE CHECK
Battery Remaining Longevity: 88 mo
Battery Voltage: 2.99 V
Date Time Interrogation Session: 20180430060018
HighPow Impedance: 78 Ohm
HighPow Impedance: 78 Ohm
Implantable Lead Implant Date: 20160622
Implantable Lead Location: 753860
Implantable Lead Model: 181
Implantable Lead Serial Number: 331169
Implantable Pulse Generator Implant Date: 20160622
Lead Channel Impedance Value: 410 Ohm
Lead Channel Sensing Intrinsic Amplitude: 9.2 mV
Lead Channel Setting Pacing Amplitude: 2.5 V
Lead Channel Setting Pacing Pulse Width: 0.5 ms
MDC IDC MSMT BATTERY REMAINING PERCENTAGE: 85 %
MDC IDC MSMT LEADCHNL RV PACING THRESHOLD AMPLITUDE: 0.5 V
MDC IDC MSMT LEADCHNL RV PACING THRESHOLD PULSEWIDTH: 0.5 ms
MDC IDC SET LEADCHNL RV SENSING SENSITIVITY: 0.5 mV
MDC IDC STAT BRADY RV PERCENT PACED: 1 %
Pulse Gen Serial Number: 7280026

## 2016-06-19 ENCOUNTER — Encounter: Payer: Self-pay | Admitting: Internal Medicine

## 2016-07-11 ENCOUNTER — Encounter: Payer: Self-pay | Admitting: Cardiology

## 2016-07-11 ENCOUNTER — Other Ambulatory Visit: Payer: Self-pay | Admitting: *Deleted

## 2016-07-12 ENCOUNTER — Other Ambulatory Visit: Payer: Self-pay

## 2016-07-12 DIAGNOSIS — I4819 Other persistent atrial fibrillation: Secondary | ICD-10-CM

## 2016-07-12 MED ORDER — DOFETILIDE 250 MCG PO CAPS: 250.0000 ug | ORAL_CAPSULE | Freq: Two times a day (BID) | ORAL | 11 refills | Status: DC

## 2016-07-13 ENCOUNTER — Telehealth: Payer: Self-pay | Admitting: Cardiology

## 2016-07-13 ENCOUNTER — Other Ambulatory Visit: Payer: BC Managed Care – PPO | Admitting: *Deleted

## 2016-07-13 DIAGNOSIS — I4819 Other persistent atrial fibrillation: Secondary | ICD-10-CM

## 2016-07-13 LAB — BASIC METABOLIC PANEL
BUN/Creatinine Ratio: 18 (ref 10–24)
BUN: 26 mg/dL (ref 8–27)
CALCIUM: 9.2 mg/dL (ref 8.6–10.2)
CO2: 23 mmol/L (ref 18–29)
Chloride: 99 mmol/L (ref 96–106)
Creatinine, Ser: 1.45 mg/dL — ABNORMAL HIGH (ref 0.76–1.27)
GFR, EST AFRICAN AMERICAN: 60 mL/min/{1.73_m2} (ref >59)
GFR, EST NON AFRICAN AMERICAN: 52 mL/min/{1.73_m2} — AB (ref >59)
Glucose: 77 mg/dL (ref 65–99)
POTASSIUM: 5 mmol/L (ref 3.5–5.2)
Sodium: 137 mmol/L (ref 134–144)

## 2016-07-13 LAB — MAGNESIUM: Magnesium: 2.1 mg/dL (ref 1.6–2.3)

## 2016-07-13 NOTE — Telephone Encounter (Signed)
NEW MESSAGE   *STAT* If patient is at the pharmacy, call can be transferred to refill team.   1. Which medications need to be refilled? (please list name of each medication and dose if known) DOFETILIDE  2. Which pharmacy/location (including street and city if local pharmacy) is medication to be sent to? [facility] [address]  3. Do they need a 30 day or 90 day supply? 30DAY

## 2016-07-13 NOTE — Telephone Encounter (Signed)
Refills for this medication was addressed and returned to patients requested pharmacy 07/11/16  Patient made aware and advised to return call if he has trouble getting meds.

## 2016-07-18 ENCOUNTER — Other Ambulatory Visit: Payer: Self-pay | Admitting: *Deleted

## 2016-07-18 ENCOUNTER — Encounter: Payer: Self-pay | Admitting: Cardiology

## 2016-07-18 DIAGNOSIS — E875 Hyperkalemia: Secondary | ICD-10-CM

## 2016-07-18 MED ORDER — POTASSIUM CHLORIDE CRYS ER 20 MEQ PO TBCR: 20.0000 meq | EXTENDED_RELEASE_TABLET | Freq: Every day | ORAL | 9 refills | Status: DC

## 2016-07-19 ENCOUNTER — Encounter: Payer: Self-pay | Admitting: Cardiology

## 2016-07-24 ENCOUNTER — Other Ambulatory Visit: Payer: BC Managed Care – PPO | Admitting: *Deleted

## 2016-07-24 DIAGNOSIS — E875 Hyperkalemia: Secondary | ICD-10-CM

## 2016-07-24 LAB — BASIC METABOLIC PANEL
BUN / CREAT RATIO: 20 (ref 10–24)
BUN: 25 mg/dL (ref 8–27)
CO2: 23 mmol/L (ref 20–29)
CREATININE: 1.25 mg/dL (ref 0.76–1.27)
Calcium: 8.9 mg/dL (ref 8.6–10.2)
Chloride: 100 mmol/L (ref 96–106)
GFR calc Af Amer: 72 mL/min/{1.73_m2} (ref >59)
GFR, EST NON AFRICAN AMERICAN: 62 mL/min/{1.73_m2} (ref >59)
GLUCOSE: 69 mg/dL (ref 65–99)
Potassium: 4.4 mmol/L (ref 3.5–5.2)
Sodium: 136 mmol/L (ref 134–144)

## 2016-09-11 ENCOUNTER — Ambulatory Visit (INDEPENDENT_AMBULATORY_CARE_PROVIDER_SITE_OTHER): Payer: BC Managed Care – PPO | Admitting: *Deleted

## 2016-09-11 ENCOUNTER — Telehealth: Payer: Self-pay | Admitting: Cardiology

## 2016-09-11 DIAGNOSIS — I420 Dilated cardiomyopathy: Secondary | ICD-10-CM

## 2016-09-11 DIAGNOSIS — I5022 Chronic systolic (congestive) heart failure: Secondary | ICD-10-CM

## 2016-09-11 NOTE — Progress Notes (Signed)
Remote ICD transmission.   

## 2016-09-11 NOTE — Telephone Encounter (Signed)
LMOVM reminding pt to send remote transmission.   

## 2016-09-12 ENCOUNTER — Telehealth (HOSPITAL_COMMUNITY): Payer: Self-pay | Admitting: Cardiology

## 2016-09-12 NOTE — Telephone Encounter (Signed)
I called and spoke with patient about moving his appt time from 8:30 to 9:30 on 10/03/16 due to a departmental meeting . He was aware due to MyChart notification and voiced understanding.

## 2016-09-14 ENCOUNTER — Encounter: Payer: Self-pay | Admitting: Internal Medicine

## 2016-09-14 ENCOUNTER — Ambulatory Visit (INDEPENDENT_AMBULATORY_CARE_PROVIDER_SITE_OTHER): Payer: BC Managed Care – PPO | Admitting: Internal Medicine

## 2016-09-14 VITALS — BP 92/70 | HR 62 | Ht 71.0 in | Wt 200.0 lb

## 2016-09-14 DIAGNOSIS — Z79899 Other long term (current) drug therapy: Secondary | ICD-10-CM

## 2016-09-14 DIAGNOSIS — I5022 Chronic systolic (congestive) heart failure: Secondary | ICD-10-CM

## 2016-09-14 DIAGNOSIS — I4819 Other persistent atrial fibrillation: Secondary | ICD-10-CM

## 2016-09-14 DIAGNOSIS — I481 Persistent atrial fibrillation: Secondary | ICD-10-CM

## 2016-09-14 DIAGNOSIS — Z5181 Encounter for therapeutic drug level monitoring: Secondary | ICD-10-CM

## 2016-09-14 NOTE — Progress Notes (Signed)
HPI Terry [name] returns today for followup of his  ICD, implanted 2 years ago. He is a pleasant 61 yo man with atrial fibrillation, RA, s/p knee replacement and hip replacement, and chronic systolic heart failure. He underwent ICD implant and was then placed on Tikosyn. He has returned to NSR. Since we saw him last, he has done well. He denies peripheral edema and has not been in the hospital since I saw him last. He has class 2A CHF symptoms.  Allergies  Allergen Reactions  . Spironolactone     Gynecomastia  . Sulfa Antibiotics     Thrush and hives     Current Outpatient Prescriptions  Medication Sig Dispense Refill  . carvedilol (COREG) 25 MG tablet Take 1 tablet (25 mg total) by mouth 2 (two) times daily. 180 tablet 3  . dofetilide (TIKOSYN) 250 MCG capsule Take 1 capsule (250 mcg total) by mouth 2 (two) times daily. 60 capsule 11  . eplerenone (INSPRA) 25 MG tablet Take 1 tablet (25 mg total) by mouth daily. 90 tablet 3  . furosemide (LASIX) 40 MG tablet Take 1 tablet (40 mg total) by mouth 2 (two) times daily. 180 tablet 3  . KLOR-CON M20 20 MEQ tablet TAKE 1 TABLET TWICE A DAY 180 tablet 3  . potassium chloride SA (K-DUR,KLOR-CON) 20 MEQ tablet Take 1 tablet (20 mEq total) by mouth daily. 30 tablet 9  . rivaroxaban (XARELTO) 20 MG TABS tablet Take 1 tablet (20 mg total) by mouth daily with supper. 90 tablet 3  . sacubitril-valsartan (ENTRESTO) 24-26 MG TAKE 1 TABLET BY MOUTH 2 (TWO) TIMES DAILY. 60 tablet 12   No current facility-administered medications for this visit.      Past Medical History:  Diagnosis Date  . AICD (automatic cardioverter/defibrillator) present   . Anemia    "S/P knee & hip OR"  . Aortic insufficiency    mild   . Aortic root dilatation (HCC)   . Atrial fibrillation (HCC)    "off and on since 2013" (08/05/2014)  . Chronic systolic CHF (congestive heart failure), NYHA class 1 (HCC)   . Complication of anesthesia    hx of irregular beat after  anesthesia in ~ 1990's  . Dysrhythmia   . Ejection fraction < 50%    by echo 08/25/11   . GERD (gastroesophageal reflux disease)   . H/O hematuria   . H/O hiatal hernia   . 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 History of blood transfusion 2013; 2014   S/P knee and hip OR  . Hypertension   . 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 Rheumatoid arthritis (HCC) dx'd 1995    ROS:   All systems reviewed and negative except as noted in the HPI.   Past Surgical History:  Procedure Laterality Date  . CARDIAC CATHETERIZATION  ~ 2004   normal  . CARDIAC CATHETERIZATION N/A 09/07/2014   Procedure: Left Heart Cath and Coronary Angiography;  Surgeon: Laurey Morale, MD;  Location: [facility] INVASIVE CV LAB;  Service: Cardiovascular;  Laterality: N/A;  . CARDIAC DEFIBRILLATOR PLACEMENT  08/05/2014   [employer]  . CARDIOVERSION N/A 08/21/2013   Procedure: CARDIOVERSION;  Surgeon: Quintella Reichert, MD;  Location: [facility]OS[facility];  Service: Cardiovascular;  Laterality: N/A;  . COLONOSCOPY  2010; 2015   "polyps; no polyps"  . EP IMPLANTABLE DEVICE N/A 08/05/2014   Procedure: ICD Implant;  Surgeon: [name],  MD;  Location: [facility] INVASIVE CV LAB;  Service: Cardiovascular;  Laterality: N/A;  . 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 JOINT REPLACEMENT    . NASAL SEPTUM SURGERY  ~ 1975  . TONSILLECTOMY    . TOTAL HIP ARTHROPLASTY  09/08/2011   Procedure: TOTAL HIP ARTHROPLASTY ANTERIOR APPROACH;  Surgeon: [name], MD;  Location: [facility];  Service: Orthopedics;  Laterality: Right;  Right total hip replacement, left knee steroid injection  . TOTAL KNEE ARTHROPLASTY Left 11/29/2012   Procedure: LEFT TOTAL KNEE ARTHROPLASTY;  Surgeon: [name], MD;  Location: [facility];  Service: Orthopedics;  Laterality: Left;  FEMORAL NERVE BLOCK IN HOLDING AREA LEFT LEG  . URETHRAL FISTULA REPAIR  ~ 1996  . URETHRAL STRICTURE DILATATION  "several times"      Family History  Problem Relation Age of Onset  . Heart disease Father   . Heart attack Father 69  . Heart failure Brother      Social History   Social History  . Marital status: Married    Spouse name: N/A  . Number of children: N/A  . Years of education: N/A   Occupational History  . Not on file.   Social History Main Topics  . Smoking status: Never Smoker  . Smokeless tobacco: Never Used  . Alcohol use 0.6 oz/week    1 Cans of beer per week  . Drug use: No  . Sexual activity: Yes   Other Topics Concern  . Not on file   Social History Narrative  . No narrative on file     BP 92/70   Pulse 62   Ht 5\' 11"  (1.803 m)   Wt 200 lb (90.7 kg)   SpO2 98%   BMI 27.89 kg/m   Physical Exam:  Well appearing middle aged man, NAD HEENT: Unremarkable Neck:  6 cm JVD, no thyromegally Lymphatics:  No adenopathy Back:  No CVA tenderness Lungs:  Clear with no wheezes HEART:  Regular rate rhythm, soft AI murmur, no rubs, no clicks Abd:  soft, positive bowel sounds, no organomegally, no rebound, no guarding Ext:  2 plus pulses, no edema, no cyanosis, no clubbing Skin:  No rashes no nodules Neuro:  CN II through XII intact, motor grossly intact  EKG - none today  DEVICE  Normal device function.  See PaceArt for details.   Assess/Plan: 1. Chronic systolic heart failure - his symptoms appear to be class 2 and he is well compensated on his medical therapy. 2. Atrial fib - he is maintaining NSR. He will continue dofetilide. 3. AI - his murmur is soft and he does not have a wide pulse pressure. Will follow 4. ICD - his St. Jude ICD is working normally. Will recheck in several months.  .D

## 2016-09-14 NOTE — Patient Instructions (Addendum)
Medication Instructions:  Your physician recommends that you continue on your current medications as directed. Please refer to the Current Medication list given to you today.   Labwork: None ordered.  Testing/Procedures: None ordered.  Follow-Up: Your physician wants you to follow-up in: one year with Dr. [name].   You will receive a reminder letter in the mail two months in advance. If you don't receive a letter, please call our office to schedule the follow-up appointment.  Remote monitoring is used to monitor your ICD from home. This monitoring reduces the number of office visits required to check your device to one time per year. It allows [place] to keep an eye on the functioning of your device to ensure it is working properly. You are scheduled for a device check from home on 12/11/2016. You may send your transmission at any time that day. If you have a wireless device, the transmission will be sent automatically. After your physician reviews your transmission, you will receive a postcard with your next transmission date.   Any Other Special Instructions Will Be Listed Below (If Applicable).   If you need a refill on your cardiac medications before your next appointment, please call your pharmacy.

## 2016-09-15 ENCOUNTER — Encounter: Payer: Self-pay | Admitting: Cardiology

## 2016-09-18 ENCOUNTER — Encounter: Payer: Self-pay | Admitting: Physician Assistant

## 2016-10-03 ENCOUNTER — Ambulatory Visit (HOSPITAL_COMMUNITY): Payer: BC Managed Care – PPO | Attending: Cardiovascular Disease

## 2016-10-03 ENCOUNTER — Other Ambulatory Visit: Payer: Self-pay

## 2016-10-03 DIAGNOSIS — Z95810 Presence of automatic (implantable) cardiac defibrillator: Secondary | ICD-10-CM | POA: Diagnosis not present

## 2016-10-03 DIAGNOSIS — I77810 Thoracic aortic ectasia: Secondary | ICD-10-CM | POA: Insufficient documentation

## 2016-10-03 DIAGNOSIS — I420 Dilated cardiomyopathy: Secondary | ICD-10-CM | POA: Insufficient documentation

## 2016-10-03 DIAGNOSIS — I4891 Unspecified atrial fibrillation: Secondary | ICD-10-CM | POA: Diagnosis not present

## 2016-10-03 DIAGNOSIS — I080 Rheumatic disorders of both mitral and aortic valves: Secondary | ICD-10-CM | POA: Insufficient documentation

## 2016-10-03 DIAGNOSIS — I428 Other cardiomyopathies: Secondary | ICD-10-CM | POA: Insufficient documentation

## 2016-10-03 DIAGNOSIS — I5022 Chronic systolic (congestive) heart failure: Secondary | ICD-10-CM | POA: Diagnosis not present

## 2016-10-04 ENCOUNTER — Other Ambulatory Visit: Payer: Self-pay

## 2016-10-04 MED ORDER — POTASSIUM CHLORIDE CRYS ER 20 MEQ PO TBCR: 20.0000 meq | EXTENDED_RELEASE_TABLET | Freq: Every day | ORAL | 5 refills | Status: DC

## 2016-10-05 ENCOUNTER — Encounter: Payer: Self-pay | Admitting: Cardiology

## 2016-10-05 ENCOUNTER — Ambulatory Visit: Payer: BC Managed Care – PPO | Admitting: Physician Assistant

## 2016-10-05 ENCOUNTER — Telehealth: Payer: Self-pay | Admitting: *Deleted

## 2016-10-05 MED ORDER — POTASSIUM CHLORIDE CRYS ER 20 MEQ PO TBCR: 20.0000 meq | EXTENDED_RELEASE_TABLET | Freq: Every day | ORAL | 1 refills | Status: DC

## 2016-10-05 NOTE — Telephone Encounter (Signed)
Working with Ronie Spies PA-C today who reviewed charts for this afternoon - patient is scheduled for 6 month routine ROV. However, Dr. Mayford Knife recently reviewed echo and recommended patient f/u with advanced HF clinic (Dr. [name]) as he has not f/u with them since 2017. Since patient needs to see HF clinic and had also recently just seen Dr. [name], [facility] felt today's routine f/u may be duplication of work and incur patient cost - We called and offered pt to keep the appt today and also discussed Dr. Norris Cross recommendations as well.  Pt chose to cancel the appt today and he will call and get an appt at [facility].

## 2016-10-05 NOTE — Addendum Note (Signed)
Addended by: Demetrios Loll on: 10/05/2016 09:08 AM   Modules accepted: Orders

## 2016-10-05 NOTE — Telephone Encounter (Signed)
CHF FOLLOW UP SCHEDULED 9/4 @12  WITH DR [facility] PARKING INSTRUCTIONS AND CODE GIVEN TO PATIENT

## 2016-10-05 NOTE — Telephone Encounter (Signed)
lmptcb re: appt today with Ronie Spies, PA-C

## 2016-10-06 ENCOUNTER — Encounter: Payer: Self-pay | Admitting: Cardiology

## 2016-10-12 LAB — CUP PACEART REMOTE DEVICE CHECK
Battery Remaining Longevity: 86 mo
Battery Remaining Percentage: 83 %
Brady Statistic RV Percent Paced: 1 %
Date Time Interrogation Session: 20180730144827
HIGH POWER IMPEDANCE MEASURED VALUE: 75 Ohm
HighPow Impedance: 75 Ohm
Implantable Lead Implant Date: 20160622
Implantable Lead Model: 181
Implantable Lead Serial Number: 331169
Lead Channel Impedance Value: 410 Ohm
Lead Channel Pacing Threshold Amplitude: 0.5 V
Lead Channel Pacing Threshold Pulse Width: 0.5 ms
Lead Channel Sensing Intrinsic Amplitude: 9.3 mV
Lead Channel Setting Sensing Sensitivity: 0.5 mV
MDC IDC LEAD LOCATION: 753860
MDC IDC MSMT BATTERY VOLTAGE: 2.99 V
MDC IDC PG IMPLANT DT: 20160622
MDC IDC PG SERIAL: 7280026
MDC IDC SET LEADCHNL RV PACING AMPLITUDE: 2.5 V
MDC IDC SET LEADCHNL RV PACING PULSEWIDTH: 0.5 ms

## 2016-10-17 ENCOUNTER — Encounter (HOSPITAL_COMMUNITY): Payer: Self-pay | Admitting: Cardiology

## 2016-10-17 ENCOUNTER — Other Ambulatory Visit (HOSPITAL_COMMUNITY): Payer: Self-pay | Admitting: Cardiology

## 2016-10-17 ENCOUNTER — Ambulatory Visit (HOSPITAL_COMMUNITY)
Admission: RE | Admit: 2016-10-17 | Discharge: 2016-10-17 | Disposition: A | Discharge status: Home / Self Care | Payer: BC Managed Care – PPO | Source: Ambulatory Visit | Attending: Cardiology | Admitting: Cardiology

## 2016-10-17 VITALS — BP 119/74 | HR 58 | Wt 197.2 lb

## 2016-10-17 DIAGNOSIS — I5022 Chronic systolic (congestive) heart failure: Secondary | ICD-10-CM | POA: Diagnosis not present

## 2016-10-17 DIAGNOSIS — M069 Rheumatoid arthritis, unspecified: Secondary | ICD-10-CM | POA: Diagnosis not present

## 2016-10-17 DIAGNOSIS — I110 Hypertensive heart disease with heart failure: Secondary | ICD-10-CM | POA: Diagnosis not present

## 2016-10-17 DIAGNOSIS — Z7901 Long term (current) use of anticoagulants: Secondary | ICD-10-CM | POA: Diagnosis not present

## 2016-10-17 DIAGNOSIS — I420 Dilated cardiomyopathy: Secondary | ICD-10-CM | POA: Diagnosis not present

## 2016-10-17 DIAGNOSIS — N62 Hypertrophy of breast: Secondary | ICD-10-CM | POA: Insufficient documentation

## 2016-10-17 DIAGNOSIS — I480 Paroxysmal atrial fibrillation: Secondary | ICD-10-CM | POA: Insufficient documentation

## 2016-10-17 DIAGNOSIS — R001 Bradycardia, unspecified: Secondary | ICD-10-CM | POA: Diagnosis not present

## 2016-10-17 DIAGNOSIS — I481 Persistent atrial fibrillation: Secondary | ICD-10-CM | POA: Diagnosis not present

## 2016-10-17 DIAGNOSIS — Z95810 Presence of automatic (implantable) cardiac defibrillator: Secondary | ICD-10-CM | POA: Insufficient documentation

## 2016-10-17 LAB — BASIC METABOLIC PANEL WITH GFR
Anion gap: 7 (ref 5–15)
BUN: 21 mg/dL — ABNORMAL HIGH (ref 6–20)
CO2: 28 mmol/L (ref 22–32)
Calcium: 9.2 mg/dL (ref 8.9–10.3)
Chloride: 102 mmol/L (ref 101–111)
Creatinine, Ser: 1.38 mg/dL — ABNORMAL HIGH (ref 0.61–1.24)
GFR calc Af Amer: >60 mL/min
GFR calc non Af Amer: 54 mL/min — ABNORMAL LOW
Glucose, Bld: 132 mg/dL — ABNORMAL HIGH (ref 65–99)
Potassium: 4.1 mmol/L (ref 3.5–5.1)
Sodium: 137 mmol/L (ref 135–145)

## 2016-10-17 LAB — CBC
HCT: 37.4 % — ABNORMAL LOW (ref 39.0–52.0)
Hemoglobin: 12.3 g/dL — ABNORMAL LOW (ref 13.0–17.0)
MCH: 28.5 pg (ref 26.0–34.0)
MCHC: 32.9 g/dL (ref 30.0–36.0)
MCV: 86.6 fL (ref 78.0–100.0)
Platelets: 228 10*3/uL (ref 150–400)
RBC: 4.32 MIL/uL (ref 4.22–5.81)
RDW: 13.5 % (ref 11.5–15.5)
WBC: 7.5 10*3/uL (ref 4.0–10.5)

## 2016-10-17 LAB — MAGNESIUM: Magnesium: 2.1 mg/dL (ref 1.7–2.4)

## 2016-10-17 MED ORDER — SACUBITRIL-VALSARTAN 49-51 MG PO TABS: 1.0000 | ORAL_TABLET | Freq: Two times a day (BID) | ORAL | 3 refills | Status: DC

## 2016-10-17 MED ORDER — FUROSEMIDE 40 MG PO TABS: ORAL_TABLET | ORAL | 3 refills | Status: DC

## 2016-10-17 NOTE — Patient Instructions (Addendum)
Increase Sacubital-valsartan Entresto to 49/51, twice a day   Decrease Furosamide to 40 mg (1 tab)  in am, and 20 mg (1/2) tab in pm    Labs drawn today  Your physician recommends that you return for lab work in: 10 days  Your physician recommends that you schedule a follow-up appointment in: 3 months

## 2016-10-17 NOTE — Progress Notes (Signed)
Patient ID: Terry [name], male   DOB: May 18, 1955, 61 y.o.   MRN: [id number] Primary Care: [name], MD Primary Cardiologist: Armanda Magic, MD HF Cardiologist: Dr [name]  HPI: Terry [name] is a 61 y.o. 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 He presents as a new patient to the clinic today. He has had notable LV EF decline in the past six months despite guideline directed therapy by Dr. Armanda Magic and has been referred to [place] for evaluation.  Back in 2006, an echo showed EF 15-20%.  LHC at that time showed normal coronaries.  He has been managed for cardiomyopathy since that time. In 12/14, EF had improved to 50%.  However, he has had a decline since then.  Last echo in 5/16 showed EF 25%.  Atrial fibrillation was first noted in 2013.  In 7/15, he had DCCV to NSR.  In 1/16, he was noted to be back in atrial fibrillation and appears to have been in atrial fibrillation since that time. He had St Jude ICD placed by Dr [name] in 6/16.  Given fall in EF, Lexiscan Cardiolite was done. This showed EF 39% with partially reversible inferior and apical perfusion defect. He had LHC in 7/16 showing no significant CAD.  In 8/16, he was admitted for Tikosyn initiation and converted to NSR.  Most recent echo in 8/18 showed EF 35-40%, mild to moderately decreased RV systolic function.   He returns for HF followup with his wife. He is doing well symptomatically.  Swimming laps daily at the [facility] pool.  Able to do yardwork with out problems.  No significant exertional dyspnea.  No orthopnea/PND.  No chest pain.  No lightheadedness.  Went on trip to [place] and [place] in June, no problems.  Did lots of walking.  Weight stable.   Corevue checked today: No evidence for volume overload.   EKG (personally reviewed): NSR at 55, left axis deviation, septal Qs, QTc 443 msec  HIV negative, ferritin normal, immunofixation with no  monoclonal protein.  Labs (6/16): K 4.2, Creatinine 1.08, HCT 37.5 Labs (08/18/14): K 4.2, Creatinine 1.0 Labs (8/16): K 4.7, creatinine 1.18 Labs (9/16): Mg 2.1, creatinine 1.16, K 4.6 Labs (6/18): K 4.4, creatinine 1.25  PMH 1. Nonischemic dilated cardiomyopathy: Echo (2006) with EF 15-20%.  LHC in 10/06 showed normal coronaries.  By 2014, EF had improved to 50%.  Echo in 1/16 showed EF 35-40%.  Echo (5/16) showed EF 25% with mild MR.  St Jude ICD 6/16.  HIV negative, immunofixation with no monoclonal protein, and ferritin normal.  Lexiscan Cardiolite (7/16) with EF 39% and partially reversible inferior and apical perfusion defect (intermediate risk).   LHC (7/16) with EF 35-40%, no significant CAD.  - Echo (8/17): EF 45-50% - Echo (8/18): EF 35-40%, moderate diastoilc dysfunction, mild AI, mild MR, mild to moderately decreased RV systolic function.   2. Atrial fibrillation: Paroxysmal.  First noted in 2013.  DCCV in 7/15.  Back in atrial fibrillation 1/16.  Converted back to NSR with Tikosyn in 8/16.  3. HTN 4. Rheumatoid Arthritis: Has been off all medications for several years.   5. H/o TKR, h/o THR 6. Gynecomastia with spironolactone.   SH: Lives at home with wife and 2 children and a grandchild. Never smoker, drinks 1 or 2 beers a week with dinner. 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  FH: Father with MI at 57, brother with atrial fibrillation, mother with renal failure.   ROS: All systems reviewed and negative except as per HPI.   Current Outpatient Prescriptions  Medication Sig Dispense Refill  . carvedilol (COREG) 25 MG tablet Take 1 tablet (25 mg total) by mouth 2 (two) times daily. 180 tablet 3  . dofetilide (TIKOSYN) 250 MCG capsule Take 1 capsule (250 mcg total) by mouth 2 (two) times daily. 60 capsule 11  . eplerenone (INSPRA) 25 MG tablet Take 1 tablet (25 mg total) by mouth daily. 90 tablet 3  . potassium chloride SA (K-DUR,KLOR-CON) 20 MEQ tablet  Take 1 tablet (20 mEq total) by mouth daily. 90 tablet 1  . rivaroxaban (XARELTO) 20 MG TABS tablet Take 1 tablet (20 mg total) by mouth daily with supper. 90 tablet 3  . furosemide (LASIX) 40 MG tablet 40 mg (1 tab) in am, 20 mg (1/2 tab) in pm 45 tablet 3  . sacubitril-valsartan (ENTRESTO) 49-51 MG Take 1 tablet by mouth 2 (two) times daily. 60 tablet 3   No current facility-administered medications for this encounter.     Allergies  Allergen Reactions  . Spironolactone     Gynecomastia  . Sulfa Antibiotics     Thrush and hives   BP 119/74   Pulse (!) 58   Wt 197 lb 4 oz (89.5 kg)   SpO2 100%   BMI 27.51 kg/m  PHYSICAL EXAM: General: NAD Neck: No JVD, no thyromegaly or thyroid nodule.  Lungs: Clear to auscultation bilaterally with normal respiratory effort. CV: Nondisplaced PMI.  Heart regular S1/S2, no S3/S4, no murmur.  No peripheral edema.  No carotid bruit.  Normal pedal pulses.  Abdomen: Soft, nontender, no hepatosplenomegaly, no distention.  Skin: Intact without lesions or rashes.  Neurologic: Alert and oriented x 3.  Psych: Normal affect. Extremities: No clubbing or cyanosis.  HEENT: Normal.   EKG: SR 67 bpm PVCs.  ASSESSMENT & PLAN: 1. Chronic systolic HF: Nonischemic cardiomyopathy x years.  He has a Secondary school teacher ICD and is not a candidate for CRT due to narrow QRS.  7/16 LHC showed no significant CAD.  Most recent echo in 8/18 with EF 35-40%, mild to moderate RV systolic dysfunction.  NYHA I-II symptoms.  Euvolemic on exam.   - Continue current Coreg 25 mg twice a day.  - Increase Entresto to 49/51 bid.  Can decrease Lasix to 40 qam/20 qpm.  BMET today and repeat in 2 wks.   - Gynecomastia resolved off spironolactone.  Continue eplerenone 25 mg daily  2.  Atrial fibrillation: Paroxysmal.  Holding NSR on Tikosyn.  QTc ok on ECG today, check BMET/Mg today.  - Continue Xarelto 20 mg daily. No bleeding problems.  CBC today.   Follow up in 3 months.   Marca Ancona 10/17/2016

## 2016-10-27 ENCOUNTER — Other Ambulatory Visit: Payer: BC Managed Care – PPO | Admitting: *Deleted

## 2016-10-27 DIAGNOSIS — I5022 Chronic systolic (congestive) heart failure: Secondary | ICD-10-CM

## 2016-10-27 DIAGNOSIS — I10 Essential (primary) hypertension: Secondary | ICD-10-CM

## 2016-10-28 ENCOUNTER — Other Ambulatory Visit: Payer: Self-pay | Admitting: Cardiology

## 2016-10-28 ENCOUNTER — Encounter: Payer: Self-pay | Admitting: Cardiology

## 2016-10-29 LAB — BASIC METABOLIC PANEL
BUN/Creatinine Ratio: 19 (ref 10–24)
BUN: 29 mg/dL — ABNORMAL HIGH (ref 8–27)
CALCIUM: 8.9 mg/dL (ref 8.6–10.2)
CHLORIDE: 99 mmol/L (ref 96–106)
CO2: 21 mmol/L (ref 20–29)
Creatinine, Ser: 1.49 mg/dL — ABNORMAL HIGH (ref 0.76–1.27)
GFR, EST AFRICAN AMERICAN: 58 mL/min/{1.73_m2} — AB (ref >59)
GFR, EST NON AFRICAN AMERICAN: 50 mL/min/{1.73_m2} — AB (ref >59)
Glucose: 152 mg/dL — ABNORMAL HIGH (ref 65–99)
Potassium: 5.1 mmol/L (ref 3.5–5.2)
Sodium: 138 mmol/L (ref 134–144)

## 2016-10-30 ENCOUNTER — Encounter (HOSPITAL_COMMUNITY): Payer: Self-pay | Admitting: Cardiology

## 2016-10-30 ENCOUNTER — Telehealth (HOSPITAL_COMMUNITY): Payer: Self-pay

## 2016-10-30 DIAGNOSIS — I5022 Chronic systolic (congestive) heart failure: Secondary | ICD-10-CM

## 2016-10-30 DIAGNOSIS — I509 Heart failure, unspecified: Secondary | ICD-10-CM

## 2016-10-30 NOTE — Telephone Encounter (Signed)
Notes recorded by Teresa Coombs, RN on 10/30/2016 at 12:45 PM EDT Pt aware of results and agreeable. Lab appt made for Sept 25 at 930 am. ------  Notes recorded by Laurey Morale, MD on 10/30/2016 at 12:32 PM EDT Low K diet, make sure no K supplements. Repeat BMET 1 week.

## 2016-10-30 NOTE — Addendum Note (Signed)
Addended by: [place] on: 10/30/2016 04:27 PM   Modules accepted: Orders

## 2016-10-30 NOTE — Telephone Encounter (Signed)
Notes recorded by Jillyn Stacey, RN on 10/30/2016 at 12:45 PM EDT Pt aware of results and agreeable. Lab appt made for Sept 25 at 930 am. ------  Notes recorded by McLean, Dalton S, MD on 10/30/2016 at 12:32 PM EDT Low K diet, make sure no K supplements. Repeat BMET 1 week. 

## 2016-11-07 ENCOUNTER — Encounter (HOSPITAL_COMMUNITY): Payer: BC Managed Care – PPO

## 2016-11-07 ENCOUNTER — Other Ambulatory Visit: Payer: BC Managed Care – PPO

## 2016-11-07 DIAGNOSIS — I5022 Chronic systolic (congestive) heart failure: Secondary | ICD-10-CM

## 2016-11-07 LAB — BASIC METABOLIC PANEL
BUN / CREAT RATIO: 18 (ref 10–24)
BUN: 22 mg/dL (ref 8–27)
CHLORIDE: 98 mmol/L (ref 96–106)
CO2: 24 mmol/L (ref 20–29)
CREATININE: 1.24 mg/dL (ref 0.76–1.27)
Calcium: 8.7 mg/dL (ref 8.6–10.2)
GFR calc Af Amer: 72 mL/min/{1.73_m2} (ref >59)
GFR calc non Af Amer: 62 mL/min/{1.73_m2} (ref >59)
GLUCOSE: 88 mg/dL (ref 65–99)
Potassium: 4.4 mmol/L (ref 3.5–5.2)
SODIUM: 137 mmol/L (ref 134–144)

## 2016-12-08 ENCOUNTER — Other Ambulatory Visit (HOSPITAL_COMMUNITY): Payer: Self-pay | Admitting: *Deleted

## 2016-12-08 MED ORDER — SACUBITRIL-VALSARTAN 49-51 MG PO TABS: 1.0000 | ORAL_TABLET | Freq: Two times a day (BID) | ORAL | 3 refills | Status: DC

## 2016-12-11 ENCOUNTER — Ambulatory Visit (INDEPENDENT_AMBULATORY_CARE_PROVIDER_SITE_OTHER): Payer: BC Managed Care – PPO | Admitting: *Deleted

## 2016-12-11 DIAGNOSIS — I420 Dilated cardiomyopathy: Secondary | ICD-10-CM

## 2016-12-11 DIAGNOSIS — I5022 Chronic systolic (congestive) heart failure: Secondary | ICD-10-CM

## 2016-12-11 NOTE — Progress Notes (Signed)
Remote ICD transmission.   

## 2016-12-14 LAB — CUP PACEART REMOTE DEVICE CHECK
Brady Statistic RV Percent Paced: 1 %
Date Time Interrogation Session: 20181029060032
HighPow Impedance: 69 Ohm
HighPow Impedance: 69 Ohm
Implantable Lead Implant Date: 20160622
Implantable Lead Location: 753860
Implantable Lead Serial Number: 331169
Lead Channel Pacing Threshold Amplitude: 0.75 V
Lead Channel Pacing Threshold Pulse Width: 0.5 ms
Lead Channel Setting Pacing Amplitude: 2.5 V
Lead Channel Setting Sensing Sensitivity: 0.5 mV
MDC IDC MSMT BATTERY REMAINING LONGEVITY: 85 mo
MDC IDC MSMT BATTERY REMAINING PERCENTAGE: 81 %
MDC IDC MSMT BATTERY VOLTAGE: 2.96 V
MDC IDC MSMT LEADCHNL RV IMPEDANCE VALUE: 400 Ohm
MDC IDC MSMT LEADCHNL RV SENSING INTR AMPL: 9.1 mV
MDC IDC PG IMPLANT DT: 20160622
MDC IDC PG SERIAL: 7280026
MDC IDC SET LEADCHNL RV PACING PULSEWIDTH: 0.5 ms

## 2016-12-19 ENCOUNTER — Encounter: Payer: Self-pay | Admitting: Cardiology

## 2017-01-19 ENCOUNTER — Encounter: Payer: Self-pay | Admitting: Cardiology

## 2017-01-19 ENCOUNTER — Ambulatory Visit (HOSPITAL_COMMUNITY)
Admission: RE | Admit: 2017-01-19 | Discharge: 2017-01-19 | Disposition: A | Discharge status: Home / Self Care | Payer: BC Managed Care – PPO | Source: Ambulatory Visit | Attending: Cardiology | Admitting: Cardiology

## 2017-01-19 VITALS — BP 102/68 | HR 58 | Wt 199.8 lb

## 2017-01-19 DIAGNOSIS — Z888 Allergy status to other drugs, medicaments and biological substances status: Secondary | ICD-10-CM | POA: Diagnosis not present

## 2017-01-19 DIAGNOSIS — I110 Hypertensive heart disease with heart failure: Secondary | ICD-10-CM | POA: Diagnosis not present

## 2017-01-19 DIAGNOSIS — Z882 Allergy status to sulfonamides status: Secondary | ICD-10-CM | POA: Diagnosis not present

## 2017-01-19 DIAGNOSIS — Z8249 Family history of ischemic heart disease and other diseases of the circulatory system: Secondary | ICD-10-CM | POA: Insufficient documentation

## 2017-01-19 DIAGNOSIS — Z95810 Presence of automatic (implantable) cardiac defibrillator: Secondary | ICD-10-CM | POA: Diagnosis not present

## 2017-01-19 DIAGNOSIS — Z7901 Long term (current) use of anticoagulants: Secondary | ICD-10-CM | POA: Diagnosis not present

## 2017-01-19 DIAGNOSIS — I428 Other cardiomyopathies: Secondary | ICD-10-CM | POA: Insufficient documentation

## 2017-01-19 DIAGNOSIS — I5022 Chronic systolic (congestive) heart failure: Secondary | ICD-10-CM | POA: Insufficient documentation

## 2017-01-19 DIAGNOSIS — I480 Paroxysmal atrial fibrillation: Secondary | ICD-10-CM | POA: Insufficient documentation

## 2017-01-19 DIAGNOSIS — Z79899 Other long term (current) drug therapy: Secondary | ICD-10-CM | POA: Insufficient documentation

## 2017-01-19 DIAGNOSIS — M069 Rheumatoid arthritis, unspecified: Secondary | ICD-10-CM | POA: Insufficient documentation

## 2017-01-19 DIAGNOSIS — Z841 Family history of disorders of kidney and ureter: Secondary | ICD-10-CM | POA: Diagnosis not present

## 2017-01-19 LAB — BASIC METABOLIC PANEL
ANION GAP: 8 (ref 5–15)
BUN: 21 mg/dL — ABNORMAL HIGH (ref 6–20)
CALCIUM: 9.1 mg/dL (ref 8.9–10.3)
CO2: 27 mmol/L (ref 22–32)
Chloride: 101 mmol/L (ref 101–111)
Creatinine, Ser: 1.2 mg/dL (ref 0.61–1.24)
GLUCOSE: 95 mg/dL (ref 65–99)
Potassium: 4.1 mmol/L (ref 3.5–5.1)
SODIUM: 136 mmol/L (ref 135–145)

## 2017-01-19 LAB — CBC
HCT: 34.7 % — ABNORMAL LOW (ref 39.0–52.0)
Hemoglobin: 11.6 g/dL — ABNORMAL LOW (ref 13.0–17.0)
MCH: 28.8 pg (ref 26.0–34.0)
MCHC: 33.4 g/dL (ref 30.0–36.0)
MCV: 86.1 fL (ref 78.0–100.0)
PLATELETS: 235 10*3/uL (ref 150–400)
RBC: 4.03 MIL/uL — AB (ref 4.22–5.81)
RDW: 13 % (ref 11.5–15.5)
WBC: 8.4 10*3/uL (ref 4.0–10.5)

## 2017-01-19 LAB — MAGNESIUM: MAGNESIUM: 2 mg/dL (ref 1.7–2.4)

## 2017-01-19 MED ORDER — EPLERENONE 50 MG PO TABS: 50.0000 mg | ORAL_TABLET | Freq: Every day | ORAL | 3 refills | Status: DC

## 2017-01-19 NOTE — Patient Instructions (Signed)
Increase Eplercenone 50 mg (1 tab) daily  Labs drawn today (if we do not call you, then your lab work was stable)   Your physician recommends that you return for lab work in: 10 days  Your physician recommends that you schedule a follow-up appointment in: 4 months with Dr. [name]

## 2017-01-21 NOTE — Progress Notes (Signed)
Patient ID: Terry [name], male   DOB: [birth year], 61 y.o.   MRN: [id number] Primary Care: Terry Blamer, MD Primary Cardiologist: Terry Magic, MD HF Cardiologist: Dr Terry [name]  HPI: Terry [name] is a 61 y.o. 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 He presents as a new patient to the clinic today. He has had notable LV EF decline in the past six months despite guideline directed therapy by Dr. Armanda [name] and has been referred to [place] for evaluation.  Back in 2006, an echo showed EF 15-20%.  LHC at that time showed normal coronaries.  He has been managed for cardiomyopathy since that time. In 12/14, EF had improved to 50%.  However, he has had a decline since then.  Last echo in 5/16 showed EF 25%.  Atrial fibrillation was first noted in 2013.  In 7/15, he had DCCV to NSR.  In 1/16, he was noted to be back in atrial fibrillation and appears to have been in atrial fibrillation since that time. He had St Jude ICD placed by Dr Terry [name] in 6/16.  Given fall in EF, Lexiscan Cardiolite was done. This showed EF 39% with partially reversible inferior and apical perfusion defect. He had LHC in 7/16 showing no significant CAD.  In 8/16, he was admitted for Tikosyn initiation and converted to NSR.  Most recent echo in 8/18 showed EF 35-40%, mild to moderately decreased RV systolic function.   He returns for HF followup with his wife. He is doing well symptomatically. He exercises regularly, walks 2 miles a couple times a week.  He blows leaves and does other yardwork.  No exertional dyspnea or chest pain. He planning on a trip to [place] and [place] next year. Weight is stable.    St Jude device interrogated: No evidence for volume overload by thoracic impedance monitoring.   EKG (personally reviewed): NSR at 57, PVC, LAFB, septal Qs, QTc 443 msec  HIV negative, ferritin normal, immunofixation with no monoclonal protein.  Labs  (6/16): K 4.2, Creatinine 1.08, HCT 37.5 Labs (08/18/14): K 4.2, Creatinine 1.0 Labs (8/16): K 4.7, creatinine 1.18 Labs (9/16): Mg 2.1, creatinine 1.16, K 4.6 Labs (6/18): K 4.4, creatinine 1.25 Labs (9/18): K 4.4, creatinine 1.24  PMH 1. Nonischemic dilated cardiomyopathy: Echo (2006) with EF 15-20%.  LHC in 10/06 showed normal coronaries.  By 2014, EF had improved to 50%.  Echo in 1/16 showed EF 35-40%.  Echo (5/16) showed EF 25% with mild MR.  St Jude ICD 6/16.  HIV negative, immunofixation with no monoclonal protein, and ferritin normal.  Lexiscan Cardiolite (7/16) with EF 39% and partially reversible inferior and apical perfusion defect (intermediate risk).   LHC (7/16) with EF 35-40%, no significant CAD.  - Echo (8/17): EF 45-50% - Echo (8/18): EF 35-40%, moderate diastoilc dysfunction, mild AI, mild MR, mild to moderately decreased RV systolic function.   2. Atrial fibrillation: Paroxysmal.  First noted in 2013.  DCCV in 7/15.  Back in atrial fibrillation 1/16.  Converted back to NSR with Tikosyn in 8/16.  3. HTN 4. Rheumatoid Arthritis: Has been off all medications for several years.   5. H/o TKR, h/o THR 6. Gynecomastia with spironolactone.   SH: Lives at home with wife and 2 children and a grandchild. Never smoker, drinks 1 or 2 beers a week with dinner. 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   FH:  Father with MI at 61, brother with atrial fibrillation, mother with renal failure.   ROS: All systems reviewed and negative except as per HPI.   Current Outpatient Medications  Medication Sig Dispense Refill  . carvedilol (COREG) 25 MG tablet Take 1 tablet (25 mg total) by mouth 2 (two) times daily. 180 tablet 3  . dofetilide (TIKOSYN) 250 MCG capsule Take 1 capsule (250 mcg total) by mouth 2 (two) times daily. 60 capsule 11  . eplerenone (INSPRA) 50 MG tablet Take 1 tablet (50 mg total) by mouth daily. 30 tablet 3  . furosemide (LASIX) 40 MG tablet 40 mg (1 tab)  in am, 20 mg (1/2 tab) in pm 45 tablet 3  . rivaroxaban (XARELTO) 20 MG TABS tablet Take 1 tablet (20 mg total) by mouth daily with supper. 90 tablet 3  . sacubitril-valsartan (ENTRESTO) 49-51 MG Take 1 tablet by mouth 2 (two) times daily. 180 tablet 3   No current facility-administered medications for this encounter.     Allergies  Allergen Reactions  . Spironolactone     Gynecomastia  . Sulfa Antibiotics     Thrush and hives   BP 102/68   Pulse (!) 58   Wt 199 lb 12.8 oz (90.6 kg)   SpO2 97%   BMI 27.87 kg/m  PHYSICAL EXAM: General: NAD Neck: No JVD, no thyromegaly or thyroid nodule.  Lungs: Clear to auscultation bilaterally with normal respiratory effort. CV: Nondisplaced PMI.  Heart regular S1/S2, no S3/S4, no murmur.  No peripheral edema.  No carotid bruit.  Normal pedal pulses.  Abdomen: Soft, nontender, no hepatosplenomegaly, no distention.  Skin: Intact without lesions or rashes.  Neurologic: Alert and oriented x 3.  Psych: Normal affect. Extremities: No clubbing or cyanosis.  HEENT: Normal.   ASSESSMENT & PLAN: 1. Chronic systolic HF: Nonischemic cardiomyopathy x years.  He has a Secondary school teacher ICD and is not a candidate for CRT due to narrow QRS.  7/16 LHC showed no significant CAD.  Most recent echo in 8/18 with EF 35-40%, mild to moderate RV systolic dysfunction.  NYHA I-II symptoms.  Euvolemic on exam.   - Continue current Coreg 25 mg twice a day.  - Continue Entresto 49/51 bid and Lasix 40 qam/20 qpm.    - Gynecomastia resolved off spironolactone.  Increase eplerenone to 50 mg daily with BMET in 10 days.  2.  Atrial fibrillation: Paroxysmal.  Holding NSR on Tikosyn.  QTc ok on ECG today. - Check BMET/Mg today.  - Continue Xarelto 20 mg daily. No bleeding problems.  CBC today.   Follow up in 4 months.   Terry [name] 01/21/2017

## 2017-01-23 ENCOUNTER — Other Ambulatory Visit (HOSPITAL_COMMUNITY): Payer: Self-pay | Admitting: *Deleted

## 2017-01-23 MED ORDER — EPLERENONE 50 MG PO TABS: 50.0000 mg | ORAL_TABLET | Freq: Every day | ORAL | 3 refills | Status: DC

## 2017-01-29 NOTE — Addendum Note (Signed)
Encounter addended by: Noralee Space, RN on: 01/29/2017 2:02 PM  Actions taken: Order list changed, Diagnosis association updated

## 2017-01-30 ENCOUNTER — Other Ambulatory Visit (HOSPITAL_COMMUNITY): Payer: Self-pay | Admitting: Cardiology

## 2017-01-30 DIAGNOSIS — I5022 Chronic systolic (congestive) heart failure: Secondary | ICD-10-CM

## 2017-01-31 ENCOUNTER — Other Ambulatory Visit: Payer: BC Managed Care – PPO

## 2017-01-31 DIAGNOSIS — I5022 Chronic systolic (congestive) heart failure: Secondary | ICD-10-CM

## 2017-01-31 LAB — BASIC METABOLIC PANEL
BUN/Creatinine Ratio: 18 (ref 10–24)
BUN: 22 mg/dL (ref 8–27)
CALCIUM: 8.8 mg/dL (ref 8.6–10.2)
CO2: 25 mmol/L (ref 20–29)
CREATININE: 1.19 mg/dL (ref 0.76–1.27)
Chloride: 98 mmol/L (ref 96–106)
GFR calc Af Amer: 76 mL/min/{1.73_m2} (ref >59)
GFR, EST NON AFRICAN AMERICAN: 66 mL/min/{1.73_m2} (ref >59)
GLUCOSE: 81 mg/dL (ref 65–99)
POTASSIUM: 4.4 mmol/L (ref 3.5–5.2)
SODIUM: 137 mmol/L (ref 134–144)

## 2017-02-08 ENCOUNTER — Other Ambulatory Visit: Payer: Self-pay | Admitting: Internal Medicine

## 2017-02-20 ENCOUNTER — Telehealth (HOSPITAL_COMMUNITY): Payer: Self-pay | Admitting: Pharmacist

## 2017-02-20 NOTE — Telephone Encounter (Signed)
Entresto PA approved by [employer] through 02/20/18.   Tyler Deis. Bonnye Fava, PharmD, BCPS, CPP Clinical Pharmacist Phone: [phone] 02/20/2017 2:08 PM

## 2017-02-28 ENCOUNTER — Telehealth (HOSPITAL_COMMUNITY): Payer: Self-pay | Admitting: Pharmacist

## 2017-02-28 NOTE — Telephone Encounter (Signed)
Dofetilide PA approved by [employer] through 02/27/19.   Tyler Deis. Bonnye Fava, PharmD, BCPS, CPP Clinical Pharmacist Phone: [phone] 02/28/2017 11:08 AM

## 2017-03-12 ENCOUNTER — Ambulatory Visit (INDEPENDENT_AMBULATORY_CARE_PROVIDER_SITE_OTHER): Payer: BC Managed Care – PPO | Admitting: *Deleted

## 2017-03-12 DIAGNOSIS — I420 Dilated cardiomyopathy: Secondary | ICD-10-CM

## 2017-03-12 DIAGNOSIS — I5022 Chronic systolic (congestive) heart failure: Secondary | ICD-10-CM

## 2017-03-12 NOTE — Progress Notes (Signed)
Remote ICD transmission.   

## 2017-03-14 ENCOUNTER — Encounter: Payer: Self-pay | Admitting: Cardiology

## 2017-03-15 LAB — CUP PACEART REMOTE DEVICE CHECK
Battery Remaining Longevity: 82 mo
Battery Remaining Percentage: 79 %
Battery Voltage: 2.96 V
Brady Statistic RV Percent Paced: 1 %
HIGH POWER IMPEDANCE MEASURED VALUE: 69 Ohm
HighPow Impedance: 69 Ohm
Implantable Lead Implant Date: 20160622
Implantable Lead Model: 181
Implantable Lead Serial Number: 331169
Lead Channel Impedance Value: 400 Ohm
Lead Channel Pacing Threshold Amplitude: 0.75 V
Lead Channel Sensing Intrinsic Amplitude: 8.8 mV
Lead Channel Setting Pacing Amplitude: 2.5 V
Lead Channel Setting Sensing Sensitivity: 0.5 mV
MDC IDC LEAD LOCATION: 753860
MDC IDC MSMT LEADCHNL RV PACING THRESHOLD PULSEWIDTH: 0.5 ms
MDC IDC PG IMPLANT DT: 20160622
MDC IDC PG SERIAL: 7280026
MDC IDC SESS DTM: 20190128093515
MDC IDC SET LEADCHNL RV PACING PULSEWIDTH: 0.5 ms

## 2017-03-29 ENCOUNTER — Encounter: Payer: Self-pay | Admitting: Cardiology

## 2017-03-29 ENCOUNTER — Ambulatory Visit (INDEPENDENT_AMBULATORY_CARE_PROVIDER_SITE_OTHER): Payer: BC Managed Care – PPO | Admitting: Cardiology

## 2017-03-29 VITALS — BP 110/84 | HR 61 | Ht 71.0 in | Wt 203.0 lb

## 2017-03-29 DIAGNOSIS — I5022 Chronic systolic (congestive) heart failure: Secondary | ICD-10-CM

## 2017-03-29 DIAGNOSIS — I77810 Thoracic aortic ectasia: Secondary | ICD-10-CM | POA: Diagnosis not present

## 2017-03-29 DIAGNOSIS — I481 Persistent atrial fibrillation: Secondary | ICD-10-CM | POA: Diagnosis not present

## 2017-03-29 DIAGNOSIS — I10 Essential (primary) hypertension: Secondary | ICD-10-CM

## 2017-03-29 DIAGNOSIS — I420 Dilated cardiomyopathy: Secondary | ICD-10-CM | POA: Diagnosis not present

## 2017-03-29 DIAGNOSIS — I4819 Other persistent atrial fibrillation: Secondary | ICD-10-CM

## 2017-03-29 NOTE — Progress Notes (Signed)
Cardiology Office Note:    Date:  03/29/2017   ID:  Terry [name], DOB Dec 24, 1955, MRN [id number]  PCP:  [name], MD  Cardiologist:  No primary care provider on file.    Referring MD: [name], MD   Chief Complaint  Patient presents with  . Congestive Heart Failure  . Hypertension  . Atrial Fibrillation    History of Present Illness:    Terry [name] is a 62 y.o. male with a hx of chronic systolic CHF due to nonischemic cardiomyopathy, HTN, and persistent AF s/p St Jude ICD 08/05/14. He had notable LV EF decline despite guideline directed therapy and was referred to AHF clinic for evaluation. Back in 2006, an echo showed EF 15-20%. LHC at that time showed normal coronaries. He has been managed for cardiomyopathy since that time. Atrial fibrillation was first noted in 2013. In 7/15, he had DCCV to NSR. In 1/16, he was noted to be back in atrial fibrillation. He had St Jude ICD placed by Dr [name] in 6/16. Given fall in EF, Lexiscan Cardiolite was done. This showed EF 39% with partially reversible inferior and apical perfusion defect. He had LHC in 7/16 showing no significant CAD. In 8/16, he was admitted for Tikosyn initiation and converted to NSR. He has been maintaining NSR since then. His most recent echo 10/04/2016 showed EF 35-40% and stable dilated aortic root at 27mm.    He was seen by Dr. [name] in AHF clinic in December and was doing well.  His ICD interrogation showed no evidence of volume overload by thoracic impedance. He did not tolerate spiro due to gynecomastia and his eplerenone was increased to 50mg  daily.  He is on Tikosyn and Xarelto for his afib.  He is here today for followup and is doing well.  He continues to walk several miles a few days weekly without any problems and works out in the yard.  He denies any chest pain or pressure, SOB, DOE, PND, orthopnea, LE edema, dizziness, palpitations or syncope. He is compliant with his meds and is tolerating  meds with no SE.    Past Medical History:  Diagnosis Date  . AICD (automatic cardioverter/defibrillator) present   . Anemia    "S/P knee & hip OR"  . Aortic insufficiency    mild   . Aortic root dilatation (HCC)   . Atrial fibrillation (HCC)    "off and on since 2013" (08/05/2014)  . Chronic systolic CHF (congestive heart failure), NYHA class 1 (HCC)   . Complication of anesthesia    hx of irregular beat after anesthesia in ~ 1990's  . Dysrhythmia   . Ejection fraction < 50%    by echo 08/25/11   . GERD (gastroesophageal reflux disease)   . H/O hematuria   . H/O hiatal hernia   . 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 History of blood transfusion 2013; 2014   S/P knee and hip OR  . Hypertension   . 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 Rheumatoid arthritis (HCC) dx'd 1995    Past Surgical History:  Procedure Laterality Date  . CARDIAC CATHETERIZATION  ~ 2004   normal  . CARDIAC CATHETERIZATION N/A 09/07/2014   Procedure: Left Heart Cath and Coronary Angiography;  Surgeon: 09/09/2014, MD;  Location: [facility] INVASIVE CV LAB;  Service: Cardiovascular;  Laterality: N/A;  . CARDIAC DEFIBRILLATOR PLACEMENT  08/05/2014   St[employer] Jude  . CARDIOVERSION N/A  08/21/2013   Procedure: CARDIOVERSION;  Surgeon: Quintella Reichert, MD;  Location: [facility] ENDOSCOPY;  Service: Cardiovascular;  Laterality: N/A;  . COLONOSCOPY  2010; 2015   "polyps; no polyps"  . EP IMPLANTABLE DEVICE N/A 08/05/2014   Procedure: ICD Implant;  Surgeon: [name], MD;  Location: [facility] INVASIVE CV LAB;  Service: Cardiovascular;  Laterality: N/A;  . 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 JOINT REPLACEMENT    . NASAL SEPTUM SURGERY  ~ 1975  . TONSILLECTOMY    . TOTAL HIP ARTHROPLASTY  09/08/2011   Procedure: TOTAL HIP ARTHROPLASTY ANTERIOR APPROACH;  Surgeon: [name], MD;  Location: [facility] ORS;  Service: Orthopedics;  Laterality: Right;  Right total hip replacement, left  knee steroid injection  . TOTAL KNEE ARTHROPLASTY Left 11/29/2012   Procedure: LEFT TOTAL KNEE ARTHROPLASTY;  Surgeon: [name], MD;  Location: [facility] ORS;  Service: Orthopedics;  Laterality: Left;  FEMORAL NERVE BLOCK IN HOLDING AREA LEFT LEG  . URETHRAL FISTULA REPAIR  ~ 1996  . URETHRAL STRICTURE DILATATION  "several times"    Current Medications: Current Meds  Medication Sig  . carvedilol (COREG) 25 MG tablet Take 1 tablet (25 mg total) by mouth 2 (two) times daily.  Marland Kitchen dofetilide (TIKOSYN) 250 MCG capsule Take 1 capsule (250 mcg total) by mouth 2 (two) times daily.  Marland Kitchen eplerenone (INSPRA) 50 MG tablet Take 1 tablet (50 mg total) by mouth daily.  . furosemide (LASIX) 40 MG tablet 40 mg (1 tab) in am, 20 mg (1/2 tab) in pm  . rivaroxaban (XARELTO) 20 MG TABS tablet Take 1 tablet (20 mg total) by mouth daily with supper.  . sacubitril-valsartan (ENTRESTO) 49-51 MG Take 1 tablet by mouth 2 (two) times daily.     Allergies:   Spironolactone and Sulfa antibiotics   Social History   Socioeconomic History  . Marital status: Married    Spouse name: None  . Number of children: None  . Years of education: None  . Highest education level: None  Social Needs  . Financial resource strain: None  . Food insecurity - worry: None  . Food insecurity - inability: None  . Transportation needs - medical: None  . Transportation needs - non-medical: None  Occupational History  . None  Tobacco Use  . Smoking status: Never Smoker  . Smokeless tobacco: Never Used  Substance and Sexual Activity  . Alcohol use: Yes    Alcohol/week: 0.6 oz    Types: 1 Cans of beer per week  . Drug use: No  . Sexual activity: Yes  Other Topics Concern  . None  Social History Narrative  . None     Family History: The patient's family history includes Heart attack (age of onset: 33) in his father; Heart disease in his father; Heart failure in his brother.  ROS:   Please see the history of present  illness.    ROS  All other systems reviewed and negative.   EKGs/Labs/Other Studies Reviewed:    The following studies were reviewed today: none  EKG:  EKG is not ordered today.   Recent Labs: 01/19/2017: Hemoglobin 11.6; Magnesium 2.0; Platelets 235 01/31/2017: BUN 22; Creatinine, Ser 1.19; Potassium 4.4; Sodium 137   Recent Lipid Panel No results found for: CHOL, TRIG, HDL, CHOLHDL, VLDL, LDLCALC, LDLDIRECT  Physical Exam:    VS:  BP 110/84 (BP Location: Right Arm, Patient Position: Sitting, Cuff Size: Normal)   Pulse  61   Ht 5\' 11"  (1.803 m)   Wt 203 lb (92.1 kg)   SpO2 99%   BMI 28.31 kg/m     Wt Readings from Last 3 Encounters:  03/29/17 203 lb (92.1 kg)  01/19/17 199 lb 12.8 oz (90.6 kg)  10/17/16 197 lb 4 oz (89.5 kg)     GEN:  Well nourished, well developed in no acute distress HEENT: Normal NECK: No JVD; No carotid bruits LYMPHATICS: No lymphadenopathy CARDIAC: RRR, no murmurs, rubs, gallops RESPIRATORY:  Clear to auscultation without rales, wheezing or rhonchi  ABDOMEN: Soft, non-tender, non-distended MUSCULOSKELETAL:  No edema; No deformity  SKIN: Warm and dry NEUROLOGIC:  Alert and oriented x 3 PSYCHIATRIC:  Normal affect   ASSESSMENT:    1. Nonischemic dilated cardiomyopathy (HCC)   2. Essential hypertension   3. Persistent atrial fibrillation (HCC)   4. Chronic systolic heart failure (HCC)   5. Aortic root dilatation (HCC)    PLAN:    In order of problems listed above:  1.  Nonischemic DCM - s/p AICD and is not a candidate for CRT due to narrow QRS.  2.  HTN - his BP is well controlled on exam today. He will continue on Carvedilol and Entresto.   3. Persistent atrial fibrillation - He is maintaining NSR on Tikosyn.  He will continue on Xarelto and carvedilol.  .  His QTc at .  His creatinine was 1.19 and Hbg 11.3 by labs 01/2017.    4.  Chronic systolic CHF - he appears euvolemic on exam today.  He weights daily at home and his weight  is stable.  He will continue on carvedoil 25mg  BID, Eplerenone 50mg  daily, Lasix 40mg  qam and 20mg  qpm, Entresto 49-51mg  BID.   5.  Aortic root dilatation - 2D echo showed aortic root 1mm and ascending aorta 40mm by echo 09/2016.  Will repeat echo 09/2017.   Medication Adjustments/Labs and Tests Ordered: Current medicines are reviewed at length with the patient today.  Concerns regarding medicines are outlined above.  No orders of the defined types were placed in this encounter.  No orders of the defined types were placed in this encounter.   Signed, Armanda Magic, MD  03/29/2017 11:40 AM    [facility]

## 2017-03-29 NOTE — Patient Instructions (Signed)
Medication Instructions:  Your physician recommends that you continue on your current medications as directed. Please refer to the Current Medication list given to you today.  Labwork: None ordered   Testing/Procedures: Your physician has requested that you have an echocardiogram in August 2019. Echocardiography is a painless test that uses sound waves to create images of your heart. It provides your doctor with information about the size and shape of your heart and how well your heart's chambers and valves are working. This procedure takes approximately one hour. There are no restrictions for this procedure.   Follow-Up: Your physician wants you to follow-up in: 6 months with Dr. Mayford Knife. You will receive a reminder letter in the mail two months in advance. If you don't receive a letter, please call our office to schedule the follow-up appointment.  Any Other Special Instructions Will Be Listed Below (If Applicable).    Thank you for choosing [facility]    Lyda Perone, RN  [phone]  If you need a refill on your cardiac medications before your next appointment, please call your pharmacy.

## 2017-04-16 ENCOUNTER — Other Ambulatory Visit (HOSPITAL_COMMUNITY): Payer: Self-pay | Admitting: *Deleted

## 2017-04-16 MED ORDER — EPLERENONE 50 MG PO TABS: 50.0000 mg | ORAL_TABLET | Freq: Every day | ORAL | 3 refills | Status: DC

## 2017-04-30 ENCOUNTER — Encounter: Payer: Self-pay | Admitting: Cardiology

## 2017-05-02 ENCOUNTER — Other Ambulatory Visit: Payer: Self-pay | Admitting: Cardiology

## 2017-05-02 MED ORDER — CARVEDILOL 25 MG PO TABS: 25.0000 mg | ORAL_TABLET | Freq: Two times a day (BID) | ORAL | 3 refills | Status: DC

## 2017-05-02 MED ORDER — EPLERENONE 50 MG PO TABS: 50.0000 mg | ORAL_TABLET | Freq: Every day | ORAL | 3 refills | Status: DC

## 2017-05-02 MED ORDER — FUROSEMIDE 40 MG PO TABS: ORAL_TABLET | ORAL | 3 refills | Status: DC

## 2017-05-02 NOTE — Telephone Encounter (Signed)
Pt's medication was sent to pt's pharmacy as requested. Confirmation received.  °

## 2017-05-09 DIAGNOSIS — J209 Acute bronchitis, unspecified: Secondary | ICD-10-CM | POA: Diagnosis not present

## 2017-05-16 ENCOUNTER — Other Ambulatory Visit: Payer: Self-pay | Admitting: Cardiology

## 2017-05-17 MED ORDER — FUROSEMIDE 40 MG PO TABS: ORAL_TABLET | ORAL | 3 refills | Status: DC

## 2017-05-17 NOTE — Addendum Note (Signed)
Addended by: Demetrios Loll on: 05/17/2017 02:32 PM   Modules accepted: Orders

## 2017-05-21 ENCOUNTER — Encounter (HOSPITAL_COMMUNITY): Payer: Self-pay | Admitting: Cardiology

## 2017-05-22 ENCOUNTER — Ambulatory Visit (HOSPITAL_COMMUNITY)
Admission: RE | Admit: 2017-05-22 | Discharge: 2017-05-22 | Disposition: A | Discharge status: Home / Self Care | Payer: BC Managed Care – PPO | Source: Ambulatory Visit | Attending: Cardiology | Admitting: Cardiology

## 2017-05-22 ENCOUNTER — Other Ambulatory Visit (HOSPITAL_COMMUNITY): Payer: Self-pay | Admitting: *Deleted

## 2017-05-22 ENCOUNTER — Encounter (HOSPITAL_COMMUNITY): Payer: Self-pay | Admitting: Cardiology

## 2017-05-22 VITALS — BP 103/70 | HR 58 | Wt 202.8 lb

## 2017-05-22 DIAGNOSIS — I428 Other cardiomyopathies: Secondary | ICD-10-CM | POA: Insufficient documentation

## 2017-05-22 DIAGNOSIS — Z882 Allergy status to sulfonamides status: Secondary | ICD-10-CM | POA: Diagnosis not present

## 2017-05-22 DIAGNOSIS — Z8249 Family history of ischemic heart disease and other diseases of the circulatory system: Secondary | ICD-10-CM | POA: Diagnosis not present

## 2017-05-22 DIAGNOSIS — Z95810 Presence of automatic (implantable) cardiac defibrillator: Secondary | ICD-10-CM | POA: Diagnosis not present

## 2017-05-22 DIAGNOSIS — Z888 Allergy status to other drugs, medicaments and biological substances status: Secondary | ICD-10-CM | POA: Insufficient documentation

## 2017-05-22 DIAGNOSIS — Z841 Family history of disorders of kidney and ureter: Secondary | ICD-10-CM | POA: Diagnosis not present

## 2017-05-22 DIAGNOSIS — I480 Paroxysmal atrial fibrillation: Secondary | ICD-10-CM | POA: Diagnosis not present

## 2017-05-22 DIAGNOSIS — I5022 Chronic systolic (congestive) heart failure: Secondary | ICD-10-CM | POA: Diagnosis not present

## 2017-05-22 DIAGNOSIS — Z79899 Other long term (current) drug therapy: Secondary | ICD-10-CM | POA: Insufficient documentation

## 2017-05-22 DIAGNOSIS — Z96659 Presence of unspecified artificial knee joint: Secondary | ICD-10-CM | POA: Insufficient documentation

## 2017-05-22 DIAGNOSIS — M069 Rheumatoid arthritis, unspecified: Secondary | ICD-10-CM | POA: Insufficient documentation

## 2017-05-22 DIAGNOSIS — I110 Hypertensive heart disease with heart failure: Secondary | ICD-10-CM | POA: Diagnosis not present

## 2017-05-22 DIAGNOSIS — Z96649 Presence of unspecified artificial hip joint: Secondary | ICD-10-CM | POA: Diagnosis not present

## 2017-05-22 DIAGNOSIS — Z7901 Long term (current) use of anticoagulants: Secondary | ICD-10-CM | POA: Diagnosis not present

## 2017-05-22 MED ORDER — EPLERENONE 50 MG PO TABS: 50.0000 mg | ORAL_TABLET | Freq: Every day | ORAL | 3 refills | Status: DC

## 2017-05-22 NOTE — Patient Instructions (Signed)
Please have labs drawn on 05/23/2017.  Follow up with Dr.McLean in 6 months

## 2017-05-22 NOTE — Progress Notes (Signed)
Patient ID: Terry [name], male   DOB: February 15, 1955, 62 y.o.   MRN: [id number] Primary Care: Terry Blamer, MD Primary Cardiologist: Terry Magic, MD HF Cardiologist: Dr Terry [name]  HPI: Terry [name] is a 62 y.o. 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 He presents as a new patient to the clinic today. He has had notable LV EF decline in the past six months despite guideline directed therapy by Dr. Armanda [name] and has been referred to [place] for evaluation.  Back in 2006, an echo showed EF 15-20%.  LHC at that time showed normal coronaries.  He has been managed for cardiomyopathy since that time. In 12/14, EF had improved to 50%.  However, he has had a decline since then.  Last echo in 5/16 showed EF 25%.  Atrial fibrillation was first noted in 2013.  In 7/15, he had DCCV to NSR.  In 1/16, he was noted to be back in atrial fibrillation and appears to have been in atrial fibrillation since that time. He had St Jude ICD placed by Dr Terry [name] in 6/16.  Given fall in EF, Lexiscan Cardiolite was done. This showed EF 39% with partially reversible inferior and apical perfusion defect. He had LHC in 7/16 showing no significant CAD.  In 8/16, he was admitted for Tikosyn initiation and converted to NSR.  Most recent echo in 8/18 showed EF 35-40%, mild to moderately decreased RV systolic function.   He presents today for regular follow up. He has been feeling great overall. Trying to build up his stamina. Mowing the yard and walking 2-3 miles a day without difficult. Denies any exertional dyspnea or CP.  He has mild lightheadedness when he stands up rapidly from bending over.  Weight is stable.   He returns for HF followup with his wife. He is doing well symptomatically. He exercises regularly, walks 2 miles a couple times a week.  He blows leaves and does other yardwork.  No exertional dyspnea or chest pain. He planning on a trip to [place]  and [place] next year. Weight stable around 195-199 at home. Just got over a bronchitis a couple of weeks ago.   St Jude device interrogation: Thoracic impedence near baseline. No VT/VF   EKG: Sinus brady, left axis deviation, QTc 452, personally reviewed.  HIV negative, ferritin normal, immunofixation with no monoclonal protein.  Labs (6/16): K 4.2, Creatinine 1.08, HCT 37.5 Labs (08/18/14): K 4.2, Creatinine 1.0 Labs (8/16): K 4.7, creatinine 1.18 Labs (9/16): Mg 2.1, creatinine 1.16, K 4.6 Labs (6/18): K 4.4, creatinine 1.25 Labs (9/18): K 4.4, creatinine 1.24 Labs (12/18): K 4.1, creatinine 1.2, hgb 11.6  PMH 1. Nonischemic dilated cardiomyopathy: Echo (2006) with EF 15-20%.  LHC in 10/06 showed normal coronaries.  By 2014, EF had improved to 50%.  Echo in 1/16 showed EF 35-40%.  Echo (5/16) showed EF 25% with mild MR.  St Jude ICD 6/16.  HIV negative, immunofixation with no monoclonal protein, and ferritin normal.  Lexiscan Cardiolite (7/16) with EF 39% and partially reversible inferior and apical perfusion defect (intermediate risk).   LHC (7/16) with EF 35-40%, no significant CAD.  - Echo (8/17): EF 45-50% - Echo (8/18): EF 35-40%, moderate diastoilc dysfunction, mild AI, mild MR, mild to moderately decreased RV systolic function.   2. Atrial fibrillation: Paroxysmal.  First noted in 2013.  DCCV in 7/15.  Back in atrial fibrillation 1/16.  Converted back  to NSR with Tikosyn in 8/16.  3. HTN 4. Rheumatoid Arthritis: Has been off all medications for several years.   5. H/o TKR, h/o THR 6. Gynecomastia with spironolactone.   SH: Lives at home with wife and 2 children and a grandchild. Never smoker, drinks 1 or 2 beers a week with dinner. 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   FH: Father with MI at 12, brother with atrial fibrillation, mother with renal failure.   Review of systems complete and found to be negative unless listed in HPI.    Current Outpatient  Medications  Medication Sig Dispense Refill  . carvedilol (COREG) 25 MG tablet Take 1 tablet (25 mg total) by mouth 2 (two) times daily. 180 tablet 3  . dofetilide (TIKOSYN) 250 MCG capsule Take 1 capsule (250 mcg total) by mouth 2 (two) times daily. 60 capsule 11  . eplerenone (INSPRA) 50 MG tablet Take 1 tablet (50 mg total) by mouth daily. 90 tablet 3  . furosemide (LASIX) 40 MG tablet 40 mg (1 tab) in am, 20 mg (1/2 tab) in pm 135 tablet 3  . rivaroxaban (XARELTO) 20 MG TABS tablet Take 1 tablet (20 mg total) by mouth daily with supper. 90 tablet 3  . sacubitril-valsartan (ENTRESTO) 49-51 MG Take 1 tablet by mouth 2 (two) times daily. 180 tablet 3   No current facility-administered medications for this encounter.    Allergies  Allergen Reactions  . Spironolactone     Gynecomastia  . Sulfa Antibiotics     Thrush and hives   Vitals:   05/22/17 0959  BP: 103/70  Pulse: (!) 58  SpO2: 100%  Weight: 202 lb 12 oz (92 kg)    Wt Readings from Last 3 Encounters:  03/29/17 203 lb (92.1 kg)  01/19/17 199 lb 12.8 oz (90.6 kg)  10/17/16 197 lb 4 oz (89.5 kg)    PHYSICAL EXAM: General: Well appearing. No resp difficulty. HEENT: Normal Neck: Supple. JVP 5-6. Carotids 2+ bilat; no bruits. No thyromegaly or nodule noted. Cor: PMI nondisplaced. RRR, No M/G/R noted Lungs: CTAB, normal effort. Abdomen: Soft, non-tender, non-distended, no HSM. No bruits or masses. +BS  Extremities: No cyanosis, clubbing, or rash. R and LLE no edema.  Neuro: Alert & orientedx3, cranial nerves grossly intact. moves all 4 extremities w/o difficulty. Affect pleasant   ASSESSMENT & PLAN: 1. Chronic systolic HF: Nonischemic cardiomyopathy x years.  He has a Secondary school teacher ICD and is not a candidate for CRT due to narrow QRS.  7/16 LHC showed no significant CAD.  Most recent echo in 8/18 with EF 35-40%, mild to moderate RV systolic dysfunction.   - NYHA I-II symptoms.  - Volume status stable on exam and ICD  interrogation - Continue lasix 40 mg q am and 20 mg q pm.  - Continue Coreg 25 mg BID - Continue Entresto 49/51 BID. BP too soft for up-titration.  - Gynecomastia resolved off spironolactone.   - Continue eplerenone 50 mg daily. BMET today.  - I will arrange for repeat echo.  2.  Atrial fibrillation: Paroxysmal.   - Holding NSR on Tikosyn. BMET and Mg today.  - QTc acceptable at 452. - Continue Xarelto 20 mg daily. Denies bleeding.   Doing well overall. BMET and Mg today. RTC 6 months with repeat Echo.   Graciella Freer, PA-C  05/22/2017   Patient seen with PA, agree with the above note.  He is doing well, very active.  Mild  dyspnea with push mowing.  No chest pain, orthopnea, PND.  ECG shows NSR with acceptable QTc.   On exam, he is not volume overloaded.  Regular rhythm and clear lungs.  No BP room to increase Entresto, so will continue current Coreg, Entresto, and eplerenone.  BMET and Mg to be drawn today.  I will arrange for repeat echo.   He can continue current Lasix.   He is in NSR with adequate QTc.  Continue Tikosyn.   Followup in 6 months.   Marca Ancona 05/22/2017

## 2017-05-23 ENCOUNTER — Other Ambulatory Visit: Payer: BC Managed Care – PPO | Admitting: *Deleted

## 2017-05-23 DIAGNOSIS — I5022 Chronic systolic (congestive) heart failure: Secondary | ICD-10-CM

## 2017-05-23 LAB — BASIC METABOLIC PANEL
BUN/Creatinine Ratio: 21 (ref 10–24)
BUN: 29 mg/dL — ABNORMAL HIGH (ref 8–27)
CO2: 22 mmol/L (ref 20–29)
CREATININE: 1.35 mg/dL — AB (ref 0.76–1.27)
Calcium: 9.1 mg/dL (ref 8.6–10.2)
Chloride: 101 mmol/L (ref 96–106)
GFR calc Af Amer: 65 mL/min/{1.73_m2} (ref >59)
GFR calc non Af Amer: 56 mL/min/{1.73_m2} — ABNORMAL LOW (ref >59)
Glucose: 91 mg/dL (ref 65–99)
Potassium: 4.4 mmol/L (ref 3.5–5.2)
SODIUM: 139 mmol/L (ref 134–144)

## 2017-05-23 LAB — MAGNESIUM: Magnesium: 2 mg/dL (ref 1.6–2.3)

## 2017-06-11 ENCOUNTER — Ambulatory Visit (INDEPENDENT_AMBULATORY_CARE_PROVIDER_SITE_OTHER): Payer: BC Managed Care – PPO | Admitting: *Deleted

## 2017-06-11 DIAGNOSIS — I420 Dilated cardiomyopathy: Secondary | ICD-10-CM | POA: Diagnosis not present

## 2017-06-11 NOTE — Progress Notes (Signed)
Remote ICD transmission.   

## 2017-06-12 ENCOUNTER — Encounter: Payer: Self-pay | Admitting: Cardiology

## 2017-06-29 LAB — CUP PACEART REMOTE DEVICE CHECK
Battery Remaining Longevity: 81 mo
Battery Remaining Percentage: 77 %
Brady Statistic RV Percent Paced: 1 %
HIGH POWER IMPEDANCE MEASURED VALUE: 72 Ohm
HighPow Impedance: 72 Ohm
Implantable Lead Implant Date: 20160622
Implantable Lead Location: 753860
Implantable Pulse Generator Implant Date: 20160622
Lead Channel Impedance Value: 400 Ohm
Lead Channel Pacing Threshold Amplitude: 0.75 V
Lead Channel Pacing Threshold Pulse Width: 0.5 ms
Lead Channel Setting Pacing Pulse Width: 0.5 ms
Lead Channel Setting Sensing Sensitivity: 0.5 mV
MDC IDC LEAD SERIAL: 331169
MDC IDC MSMT BATTERY VOLTAGE: 2.96 V
MDC IDC MSMT LEADCHNL RV SENSING INTR AMPL: 8.4 mV
MDC IDC SESS DTM: 20190429060017
MDC IDC SET LEADCHNL RV PACING AMPLITUDE: 2.5 V
Pulse Gen Serial Number: 7280026

## 2017-07-26 ENCOUNTER — Other Ambulatory Visit: Payer: Self-pay | Admitting: Internal Medicine

## 2017-09-10 ENCOUNTER — Ambulatory Visit (INDEPENDENT_AMBULATORY_CARE_PROVIDER_SITE_OTHER): Payer: BC Managed Care – PPO | Admitting: *Deleted

## 2017-09-10 DIAGNOSIS — I5022 Chronic systolic (congestive) heart failure: Secondary | ICD-10-CM

## 2017-09-10 DIAGNOSIS — I420 Dilated cardiomyopathy: Secondary | ICD-10-CM | POA: Diagnosis not present

## 2017-09-10 NOTE — Progress Notes (Signed)
Remote ICD transmission.   

## 2017-09-11 ENCOUNTER — Encounter: Payer: Self-pay | Admitting: Cardiology

## 2017-09-25 ENCOUNTER — Other Ambulatory Visit (HOSPITAL_COMMUNITY): Payer: BC Managed Care – PPO

## 2017-10-01 ENCOUNTER — Encounter: Payer: Self-pay | Admitting: Cardiology

## 2017-10-01 ENCOUNTER — Encounter (INDEPENDENT_AMBULATORY_CARE_PROVIDER_SITE_OTHER): Payer: Self-pay | Admitting: Orthopedic Surgery

## 2017-10-01 ENCOUNTER — Ambulatory Visit (INDEPENDENT_AMBULATORY_CARE_PROVIDER_SITE_OTHER): Payer: BC Managed Care – PPO | Admitting: Cardiology

## 2017-10-01 VITALS — BP 130/64 | HR 64 | Ht 71.0 in | Wt 202.1 lb

## 2017-10-01 DIAGNOSIS — I10 Essential (primary) hypertension: Secondary | ICD-10-CM | POA: Diagnosis not present

## 2017-10-01 DIAGNOSIS — I4819 Other persistent atrial fibrillation: Secondary | ICD-10-CM

## 2017-10-01 DIAGNOSIS — I5022 Chronic systolic (congestive) heart failure: Secondary | ICD-10-CM | POA: Diagnosis not present

## 2017-10-01 DIAGNOSIS — I77810 Thoracic aortic ectasia: Secondary | ICD-10-CM

## 2017-10-01 DIAGNOSIS — I420 Dilated cardiomyopathy: Secondary | ICD-10-CM | POA: Diagnosis not present

## 2017-10-01 DIAGNOSIS — I481 Persistent atrial fibrillation: Secondary | ICD-10-CM | POA: Diagnosis not present

## 2017-10-01 LAB — CBC
Hematocrit: 34.2 % — ABNORMAL LOW (ref 37.5–51.0)
Hemoglobin: 11.9 g/dL — ABNORMAL LOW (ref 13.0–17.7)
MCH: 29.4 pg (ref 26.6–33.0)
MCHC: 34.8 g/dL (ref 31.5–35.7)
MCV: 84 fL (ref 79–97)
PLATELETS: 254 10*3/uL (ref 150–450)
RBC: 4.05 x10E6/uL — ABNORMAL LOW (ref 4.14–5.80)
RDW: 13.6 % (ref 12.3–15.4)
WBC: 9.9 10*3/uL (ref 3.4–10.8)

## 2017-10-01 LAB — BASIC METABOLIC PANEL
BUN / CREAT RATIO: 20 (ref 10–24)
BUN: 30 mg/dL — ABNORMAL HIGH (ref 8–27)
CHLORIDE: 100 mmol/L (ref 96–106)
CO2: 20 mmol/L (ref 20–29)
Calcium: 9.3 mg/dL (ref 8.6–10.2)
Creatinine, Ser: 1.49 mg/dL — ABNORMAL HIGH (ref 0.76–1.27)
GFR calc Af Amer: 57 mL/min/{1.73_m2} — ABNORMAL LOW (ref >59)
GFR calc non Af Amer: 50 mL/min/{1.73_m2} — ABNORMAL LOW (ref >59)
GLUCOSE: 85 mg/dL (ref 65–99)
POTASSIUM: 4.6 mmol/L (ref 3.5–5.2)
Sodium: 136 mmol/L (ref 134–144)

## 2017-10-01 NOTE — Patient Instructions (Signed)
Medication Instructions:  Your physician recommends that you continue on your current medications as directed. Please refer to the Current Medication list given to you today.   Labwork: Your physician recommends that you return for lab work today: BMP/CBC   Testing/Procedures: None ordered   Follow-Up: Your physician wants you to follow-up in: 6 months with Dr Sherlyn Lick will receive a reminder letter in the mail two months in advance. If you don't receive a letter, please call our office to schedule the follow-up appointment.   Any Other Special Instructions Will Be Listed Below (If Applicable).     If you need a refill on your cardiac medications before your next appointment, please call your pharmacy.

## 2017-10-01 NOTE — Progress Notes (Signed)
Cardiology Office Note:    Date:  10/01/2017   ID:  Terry [name], DOB 17-Jun-1955, MRN [id number]  PCP:  [name], MD  Cardiologist:  No primary care provider on file.    Referring MD: [name], MD   Chief Complaint  Patient presents with  . Congestive Heart Failure  . Cardiomyopathy  . Hypertension  . Atrial Fibrillation    History of Present Illness:    Terry [name] is a 62 y.o. male with a hx of chronic systolic CHF due to nonischemic cardiomyopathy NYHA class I-II, HTN, and persistent AF s/p St Jude ICD 08/05/14. He had notable LV EF decline despiteguideline directed therapy and wasreferred to AHF clinic for evaluation. Back in 2006, an echo showed EF 15-20%. LHC at that time showed normal coronaries. He has been managed for cardiomyopathy since that time. Atrial fibrillation was first noted in 2013. In 7/15, he had DCCV to NSR. In 1/16, he was noted to be back in atrial fibrillation. He had St Jude ICD placed by Dr [name] in 6/16. CRT was not placed due to narrow QRS.  Given fall in EF, Lexiscan Cardiolite was done. This showed EF 39% with partially reversible inferior and apical perfusion defect. He had LHC in 7/16 showing no significant CAD. In 8/16, he was admitted for Tikosyn initiation and converted to NSR. He has been maintaining NSR since then. His most recent echo 10/04/2016 showed EF 35-40% and stable dilated aortic root at 47mm.    He is followed in advanced heart failure clinic by Dr. [name] and has been doing well.  He did get gynecomastia with Spironolactone and this was changed to eplerenone 50 mg daily.    He is here today for followup.  From a heart failure standpoint he is doing quite well.  He denies any chest pain or pressure, SOB, DOE, PND, orthopnea, LE edema, dizziness, palpitations or syncope. He is compliant with his meds and is tolerating meds with no SE.    He is very active and likes to work out in his yard.  He walks 2 to 3 miles a day  without any problems.  Past Medical History:  Diagnosis Date  . AICD (automatic cardioverter/defibrillator) present   . Anemia    "S/P knee & hip OR"  . Aortic insufficiency    mild   . Aortic root dilatation (HCC)   . Atrial fibrillation (HCC)    "off and on since 2013" (08/05/2014)  . Chronic systolic CHF (congestive heart failure), NYHA class 1 (HCC)   . Complication of anesthesia    hx of irregular beat after anesthesia in ~ 1990's  . Dysrhythmia   . Ejection fraction < 50%    by echo 08/25/11   . GERD (gastroesophageal reflux disease)   . H/O hematuria   . H/O hiatal hernia   . 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 History of blood transfusion 2013; 2014   S/P knee and hip OR  . Hypertension   . 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 Rheumatoid arthritis (HCC) dx'd 1995    Past Surgical History:  Procedure Laterality Date  . CARDIAC CATHETERIZATION  ~ 2004   normal  . CARDIAC CATHETERIZATION N/A 09/07/2014   Procedure: Left Heart Cath and Coronary Angiography;  Surgeon: Laurey Morale, MD;  Location: [facility] INVASIVE CV LAB;  Service: Cardiovascular;  Laterality: N/A;  . CARDIAC DEFIBRILLATOR PLACEMENT  08/05/2014   [employer]  . CARDIOVERSION N/A 08/21/2013   Procedure: CARDIOVERSION;  Surgeon: Quintella Reichert, MD;  Location: [facility] ENDOSCOPY;  Service: Cardiovascular;  Laterality: N/A;  . COLONOSCOPY  2010; 2015   "polyps; no polyps"  . EP IMPLANTABLE DEVICE N/A 08/05/2014   Procedure: ICD Implant;  Surgeon: [name], MD;  Location: [facility] INVASIVE CV LAB;  Service: Cardiovascular;  Laterality: N/A;  . 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 JOINT REPLACEMENT    . NASAL SEPTUM SURGERY  ~ 1975  . TONSILLECTOMY    . TOTAL HIP ARTHROPLASTY  09/08/2011   Procedure: TOTAL HIP ARTHROPLASTY ANTERIOR APPROACH;  Surgeon: [name], MD;  Location: [facility] ORS;  Service: Orthopedics;  Laterality: Right;  Right total hip replacement,  left knee steroid injection  . TOTAL KNEE ARTHROPLASTY Left 11/29/2012   Procedure: LEFT TOTAL KNEE ARTHROPLASTY;  Surgeon: [name], MD;  Location: [facility] ORS;  Service: Orthopedics;  Laterality: Left;  FEMORAL NERVE BLOCK IN HOLDING AREA LEFT LEG  . URETHRAL FISTULA REPAIR  ~ 1996  . URETHRAL STRICTURE DILATATION  "several times"    Current Medications: No outpatient medications have been marked as taking for the 10/01/17 encounter (Office Visit) with Quintella Reichert, MD.     Allergies:   Spironolactone and Sulfa antibiotics   Social History   Socioeconomic History  . Marital status: Married    Spouse name: Not on file  . Number of children: Not on file  . Years of education: Not on file  . Highest education level: Not on file  Occupational History  . Not on file  Social Needs  . Financial resource strain: Not on file  . Food insecurity:    Worry: Not on file    Inability: Not on file  . Transportation needs:    Medical: Not on file    Non-medical: Not on file  Tobacco Use  . Smoking status: Never Smoker  . Smokeless tobacco: Never Used  Substance and Sexual Activity  . Alcohol use: Yes    Alcohol/week: 1.0 standard drinks    Types: 1 Cans of beer per week  . Drug use: No  . Sexual activity: Yes  Lifestyle  . Physical activity:    Days per week: Not on file    Minutes per session: Not on file  . Stress: Not on file  Relationships  . Social connections:    Talks on phone: Not on file    Gets together: Not on file    Attends religious service: Not on file    Active member of club or organization: Not on file    Attends meetings of clubs or organizations: Not on file    Relationship status: Not on file  Other Topics Concern  . Not on file  Social History Narrative  . Not on file     Family History: The patient's family history includes Heart attack (age of onset: 60) in his father; Heart disease in his father; Heart failure in his brother.  ROS:     Please see the history of present illness.    ROS  All other systems reviewed and negative.   EKGs/Labs/Other Studies Reviewed:    The following studies were reviewed today: none  EKG:  EKG is not ordered today.    Recent Labs: 01/19/2017: Hemoglobin 11.6; Platelets 235 05/23/2017: BUN 29; Creatinine, Ser 1.35; Magnesium 2.0; Potassium 4.4; Sodium 139   Recent Lipid Panel No  results found for: CHOL, TRIG, HDL, CHOLHDL, VLDL, LDLCALC, LDLDIRECT  Physical Exam:    VS:  There were no vitals taken for this visit.    Wt Readings from Last 3 Encounters:  05/22/17 202 lb 12 oz (92 kg)  03/29/17 203 lb (92.1 kg)  01/19/17 199 lb 12.8 oz (90.6 kg)     GEN:  Well nourished, well developed in no acute distress HEENT: Normal NECK: No JVD; No carotid bruits LYMPHATICS: No lymphadenopathy CARDIAC: RRR, no murmurs, rubs, gallops RESPIRATORY:  Clear to auscultation without rales, wheezing or rhonchi  ABDOMEN: Soft, non-tender, non-distended MUSCULOSKELETAL:  No edema; No deformity  SKIN: Warm and dry NEUROLOGIC:  Alert and oriented x 3 PSYCHIATRIC:  Normal affect   ASSESSMENT:    1. Persistent atrial fibrillation (HCC)   2. Essential hypertension   3. Nonischemic dilated cardiomyopathy (HCC)   4. Chronic systolic heart failure (HCC)   5. Aortic root dilatation (HCC)    PLAN:    In order of problems listed above:  1.  Persistent atrial fibrillation - maintain normal sinus rhythm on exam today.  Will continue on carvedilol 25 mg twice daily, Tikosyn 250 mcg twice daily Xarelto 20 mg daily.  Hemoglobin was stable at 11.6 on 01/19/2017 and creatinine 1.35 on 05/23/2017.  I will repeat a bmet and CBC today since he is on Xarelto.  2.  Hypertension -he is well controlled on exam today.  He will continue on Entresto 49-51 mg daily, eplerenone 50 mg daily and carvedilol 25 mg twice daily.  3.  Nonischemic dilated cardiomyopathy -he has had 2 cardiac caths in the past showing normal  coronary arteries.  Echo a year ago showed an EF of 35 to 40% with mild AR and MR and moderate RV dysfunction.  He is status post AICD placement.  He was not a candidate for CRT due to narrow QRS.  4.  Chronic systolic CHF NYHA class I-II - he appears euvolemic on exam and weight is stable.  He is tolerating his heart failure medicines without any problems.  He will continue on carvedilol 25 mg twice daily, eplerenone 50 mg daily, Lasix 40 mg in the morning and 20 mg in the evening and Entresto 49-51 mg twice daily.  His creatinine was 1.35 and potassium 4.4 on 05/23/2017.  5.  Aortic root dilatation - echo a year ago showed aortic root at 42 mm.  I will repeat echo to assess for progression.   Medication Adjustments/Labs and Tests Ordered: Current medicines are reviewed at length with the patient today.  Concerns regarding medicines are outlined above.  No orders of the defined types were placed in this encounter.  No orders of the defined types were placed in this encounter.   Signed, Armanda Magic, MD  10/01/2017 8:35 AM    [facility]

## 2017-10-02 ENCOUNTER — Encounter (INDEPENDENT_AMBULATORY_CARE_PROVIDER_SITE_OTHER): Payer: Self-pay | Admitting: Orthopedic Surgery

## 2017-10-02 ENCOUNTER — Telehealth: Payer: Self-pay

## 2017-10-02 ENCOUNTER — Other Ambulatory Visit: Payer: Self-pay

## 2017-10-02 DIAGNOSIS — Z79899 Other long term (current) drug therapy: Secondary | ICD-10-CM

## 2017-10-02 MED ORDER — FUROSEMIDE 40 MG PO TABS: ORAL_TABLET | ORAL | 3 refills | Status: DC

## 2017-10-02 NOTE — Telephone Encounter (Signed)
Notes recorded by Sigurd Sos, RN on 10/02/2017 at 9:08 AM EDT Informed patient of results/recommendations. He verbalized understanding and will come in for labs next Tuesday 8/27. ------

## 2017-10-02 NOTE — Telephone Encounter (Signed)
-----   Message from Quintella Reichert, MD sent at 10/01/2017  7:11 PM EDT ----- Creatinine has bumped some - please decrease Lasix to 60mg  qod alternating with 40mg  qod and repeat BMET in 1 week - call if he develops increased LE edema or SOB

## 2017-10-03 ENCOUNTER — Ambulatory Visit (INDEPENDENT_AMBULATORY_CARE_PROVIDER_SITE_OTHER): Payer: BC Managed Care – PPO

## 2017-10-03 ENCOUNTER — Encounter (INDEPENDENT_AMBULATORY_CARE_PROVIDER_SITE_OTHER): Payer: Self-pay | Admitting: Orthopedic Surgery

## 2017-10-03 ENCOUNTER — Ambulatory Visit (INDEPENDENT_AMBULATORY_CARE_PROVIDER_SITE_OTHER): Payer: BC Managed Care – PPO | Admitting: Orthopedic Surgery

## 2017-10-03 DIAGNOSIS — G8929 Other chronic pain: Secondary | ICD-10-CM | POA: Diagnosis not present

## 2017-10-03 DIAGNOSIS — M25561 Pain in right knee: Secondary | ICD-10-CM | POA: Diagnosis not present

## 2017-10-03 DIAGNOSIS — M25522 Pain in left elbow: Secondary | ICD-10-CM

## 2017-10-03 MED ORDER — PREDNISONE 5 MG (21) PO TBPK: ORAL_TABLET | ORAL | 0 refills | Status: DC

## 2017-10-03 NOTE — Progress Notes (Signed)
Office Visit Note   Patient: Terry [name]           Date of Birth: 01/19/1956           MRN: [id number] Visit Date: 10/03/2017 Requested by: [name], MD [phone] [address] [place], [place] [address] PCP: [name], MD  Subjective: Chief Complaint  Patient presents with  . Left Elbow - Pain  . Right Knee - Pain    HPI: Boyd Kerbs is a patient with right knee pain and left elbow pain.  He is retired.  He does a lot of work in his yard.  He describes significant right knee pain and significant left elbow pain.  He does have a history of rheumatoid arthritis but had to come off medication because it was interfering with his heart.  He is on medication for atrial fibrillation.  He has a trip to [place] coming up in May.              ROS: All systems reviewed are negative as they relate to the chief complaint within the history of present illness.  Patient denies  fevers or chills.   Assessment & Plan: Visit Diagnoses:  1. Chronic pain of right knee   2. Left elbow pain     Plan: Impression is right knee arthritis.  Patient is happy with his left total knee replacement.  He states he cannot really walk well with his right knee.  We talked about an injection today.  We will hold off on that injection for now and instead proceed with total knee replacement.  He does have a flexion contracture in that right knee.  He may need a heel lift on that left side after knee replacement surgery based on standing radiographs of both knees.  I will have him see Dr. Magnus Ivan for knee surgery.  Follow-Up Instructions: No follow-ups on file.   Orders:  Orders Placed This Encounter  Procedures  . XR Elbow 2 Views Left  . XR KNEE 3 VIEW RIGHT   Meds ordered this encounter  Medications  . predniSONE (STERAPRED UNI-PAK 21 TAB) 5 MG (21) TBPK tablet    Sig: Take dosepak as directed    Dispense:  21 tablet    Refill:  0      Procedures: No procedures performed   Clinical  Data: No additional findings.  Objective: Vital Signs: There were no vitals taken for this visit.  Physical Exam:   Constitutional: Patient appears well-developed HEENT:  Head: Normocephalic Eyes:EOM are normal Neck: Normal range of motion Cardiovascular: Normal rate Pulmonary/chest: Effort normal Neurologic: Patient is alert Skin: Skin is warm Psychiatric: Patient has normal mood and affect    Ortho Exam: Ortho exam demonstrates antalgic gait to the right.  He does have about a 15 degree flexion contracture on the right-hand side with mild right knee effusion in intact extensor mechanism.  Left knee has good range of motion from nearly full extension to past 90 degrees of flexion.  No effusion in that left knee.  Left elbow demonstrates about a 30 degree flexion contracture with flexion to about 100 degrees.  Motor or sensory function to the hand is intact.  Pronation supination arc range of motion limited by about 40%.  Radial pulse intact on that left arm.  Specialty Comments:  No specialty comments available.  Imaging: Xr Elbow 2 Views Left  Result Date: 10/03/2017 AP lateral left elbow reviewed.  End-stage arthritis present in the elbow.  The elbow is located with no fracture.  Spurring and erosive changes present.  Xr Knee 3 View Right  Result Date: 10/03/2017 AP lateral merchant right knee reviewed.  Tricompartmental arthritis present in the right knee.  No fracture dislocation present.  Cemented left total knee replacement in position with mild varus alignment but no complicating features.    PMFS History: Patient Active Problem List   Diagnosis Date Noted  . S/P ICD (internal cardiac defibrillator) procedure 08/05/2014  . Nonischemic dilated cardiomyopathy (HCC)   . Hypertension   . Aortic insufficiency   . Persistent atrial fibrillation (HCC)   . Chronic systolic heart failure (HCC)   . Aortic root dilatation (HCC)   . Urinary retention 12/01/2012  .  Degenerative arthritis of left knee 11/29/2012  . Degenerative arthritis of hip 09/08/2011   Past Medical History:  Diagnosis Date  . AICD (automatic cardioverter/defibrillator) present   . Anemia    "S/P knee & hip OR"  . Aortic insufficiency    mild   . Aortic root dilatation (HCC)   . Atrial fibrillation (HCC)    "off and on since 2013" (08/05/2014)  . Chronic systolic CHF (congestive heart failure), NYHA class 1 (HCC)   . Complication of anesthesia    hx of irregular beat after anesthesia in ~ 1990's  . Dysrhythmia   . Ejection fraction < 50%    by echo 08/25/11   . GERD (gastroesophageal reflux disease)   . H/O hematuria   . H/O hiatal hernia   . 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 History of blood transfusion 2013; 2014   S/P knee and hip OR  . Hypertension   . 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 Rheumatoid arthritis (HCC) dx'd 1995    Family History  Problem Relation Age of Onset  . Heart disease Father   . Heart attack Father 64  . Heart failure Brother     Past Surgical History:  Procedure Laterality Date  . CARDIAC CATHETERIZATION  ~ 2004   normal  . CARDIAC CATHETERIZATION N/A 09/07/2014   Procedure: Left Heart Cath and Coronary Angiography;  Surgeon: Laurey Morale, MD;  [facility] INVASIVE CV LAB;  Service: Cardiovascular;  Laterality: N/A;  . CARDIAC DEFIBRILLATOR PLACEMENT  08/05/2014   [employer]  . CARDIOVERSION N/A 08/21/2013   Procedure: CARDIOVERSION;  Surgeon: Quintella Reichert, MD;  Location: [facility] ENDOSCOPY;  Service: Cardiovascular;  Laterality: N/A;  . COLONOSCOPY  2010; 2015   "polyps; no polyps"  . EP IMPLANTABLE DEVICE N/A 08/05/2014   Procedure: ICD Implant;  Surgeon: [name], MD;  Location: [facility] INVASIVE CV LAB;  Service: Cardiovascular;  Laterality: N/A;  . 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 JOINT REPLACEMENT    . NASAL SEPTUM SURGERY  ~ 1975  . TONSILLECTOMY    . TOTAL HIP  ARTHROPLASTY  09/08/2011   Procedure: TOTAL HIP ARTHROPLASTY ANTERIOR APPROACH;  Surgeon: [name], MD;  Location: [facility] OR[facility];  Service: Orthopedics;  Laterality: Right;  Right total hip replacement, left knee steroid injection  . TOTAL KNEE ARTHROPLASTY Left 11/29/2012   Procedure: LEFT TOTAL KNEE ARTHROPLASTY;  Surgeon: [name], MD;  Location: [facility] ORS;  Service: Orthopedics;  Laterality: Left;  FEMORAL NERVE BLOCK IN HOLDING AREA LEFT LEG  . URETHRAL FISTULA REPAIR  ~ 1996  . URETHRAL STRICTURE DILATATION  "several times"   Social  History   Occupational History  . Not on file  Tobacco Use  . Smoking status: Never Smoker  . Smokeless tobacco: Never Used  Substance and Sexual Activity  . Alcohol use: Yes    Alcohol/week: 1.0 standard drinks    Types: 1 Cans of beer per week  . Drug use: No  . Sexual activity: Yes

## 2017-10-04 DIAGNOSIS — R350 Frequency of micturition: Secondary | ICD-10-CM | POA: Diagnosis not present

## 2017-10-09 ENCOUNTER — Other Ambulatory Visit: Payer: BC Managed Care – PPO

## 2017-10-09 ENCOUNTER — Other Ambulatory Visit: Payer: Self-pay

## 2017-10-09 ENCOUNTER — Ambulatory Visit (HOSPITAL_COMMUNITY): Payer: BC Managed Care – PPO | Attending: Cardiovascular Disease

## 2017-10-09 ENCOUNTER — Other Ambulatory Visit: Payer: Self-pay | Admitting: Cardiology

## 2017-10-09 DIAGNOSIS — I77810 Thoracic aortic ectasia: Secondary | ICD-10-CM

## 2017-10-09 DIAGNOSIS — I481 Persistent atrial fibrillation: Secondary | ICD-10-CM

## 2017-10-09 DIAGNOSIS — I080 Rheumatic disorders of both mitral and aortic valves: Secondary | ICD-10-CM | POA: Insufficient documentation

## 2017-10-09 DIAGNOSIS — I119 Hypertensive heart disease without heart failure: Secondary | ICD-10-CM | POA: Diagnosis not present

## 2017-10-09 DIAGNOSIS — Z79899 Other long term (current) drug therapy: Secondary | ICD-10-CM

## 2017-10-09 DIAGNOSIS — Z95810 Presence of automatic (implantable) cardiac defibrillator: Secondary | ICD-10-CM | POA: Insufficient documentation

## 2017-10-09 DIAGNOSIS — I420 Dilated cardiomyopathy: Secondary | ICD-10-CM | POA: Diagnosis not present

## 2017-10-09 DIAGNOSIS — I4819 Other persistent atrial fibrillation: Secondary | ICD-10-CM

## 2017-10-09 LAB — BASIC METABOLIC PANEL
BUN / CREAT RATIO: 21 (ref 10–24)
BUN: 27 mg/dL (ref 8–27)
CO2: 26 mmol/L (ref 20–29)
Calcium: 9.4 mg/dL (ref 8.6–10.2)
Chloride: 99 mmol/L (ref 96–106)
Creatinine, Ser: 1.28 mg/dL — ABNORMAL HIGH (ref 0.76–1.27)
GFR, EST AFRICAN AMERICAN: 69 mL/min/{1.73_m2} (ref >59)
GFR, EST NON AFRICAN AMERICAN: 60 mL/min/{1.73_m2} (ref >59)
Glucose: 79 mg/dL (ref 65–99)
POTASSIUM: 4 mmol/L (ref 3.5–5.2)
Sodium: 140 mmol/L (ref 134–144)

## 2017-10-10 ENCOUNTER — Telehealth: Payer: Self-pay

## 2017-10-10 DIAGNOSIS — I77810 Thoracic aortic ectasia: Secondary | ICD-10-CM

## 2017-10-10 NOTE — Telephone Encounter (Signed)
Notes recorded by Sigurd Sos, RN on 10/10/2017 at 8:47 AM EDT Spoke with patient and informed him of results/recommendations. He verbalized understanding.

## 2017-10-10 NOTE — Telephone Encounter (Signed)
-----   Message from Quintella Reichert, MD sent at 10/09/2017  8:02 PM EDT ----- Please let patient know that echo showed mildly reduced LVF with EF 40-45%, mild AR, mild to moderate MR and mld to moderately reduced RVF.  Aortic root mildly dilated at 22mm(41mm 2018) and ascending aorta at 78mm (43mm 2018).  Repeat echo in 1 year

## 2017-10-12 ENCOUNTER — Ambulatory Visit (INDEPENDENT_AMBULATORY_CARE_PROVIDER_SITE_OTHER): Payer: BC Managed Care – PPO

## 2017-10-12 ENCOUNTER — Ambulatory Visit (INDEPENDENT_AMBULATORY_CARE_PROVIDER_SITE_OTHER): Payer: BC Managed Care – PPO | Admitting: Internal Medicine

## 2017-10-12 ENCOUNTER — Encounter: Payer: Self-pay | Admitting: Internal Medicine

## 2017-10-12 VITALS — BP 118/64 | HR 53 | Ht 71.0 in | Wt 200.0 lb

## 2017-10-12 DIAGNOSIS — I5022 Chronic systolic (congestive) heart failure: Secondary | ICD-10-CM | POA: Diagnosis not present

## 2017-10-12 DIAGNOSIS — I481 Persistent atrial fibrillation: Secondary | ICD-10-CM

## 2017-10-12 DIAGNOSIS — I351 Nonrheumatic aortic (valve) insufficiency: Secondary | ICD-10-CM

## 2017-10-12 DIAGNOSIS — Z5181 Encounter for therapeutic drug level monitoring: Secondary | ICD-10-CM

## 2017-10-12 DIAGNOSIS — I493 Ventricular premature depolarization: Secondary | ICD-10-CM

## 2017-10-12 DIAGNOSIS — Z79899 Other long term (current) drug therapy: Secondary | ICD-10-CM

## 2017-10-12 DIAGNOSIS — I4819 Other persistent atrial fibrillation: Secondary | ICD-10-CM

## 2017-10-12 DIAGNOSIS — Z95810 Presence of automatic (implantable) cardiac defibrillator: Secondary | ICD-10-CM

## 2017-10-12 NOTE — Progress Notes (Signed)
HPI Terry [name] returns today for followup of his ICD, implanted 2 years ago. He is a pleasant 62yo man with atrial fibrillation, RA, s/p knee replacement and hip replacement, and chronic systolic heart failure. He underwent ICD implant and was then placed on Tikosyn. He has returned to NSR. Since we saw him last, he has done well. He denies peripheral edema and has not been in the hospital since I saw him last. He has class 2A CHF symptoms. He has been traveling. He admits to some dietary indiscretion. Allergies  Allergen Reactions  . Spironolactone     Gynecomastia  . Sulfa Antibiotics     Thrush and hives     Current Outpatient Medications  Medication Sig Dispense Refill  . carvedilol (COREG) 25 MG tablet Take 1 tablet (25 mg total) by mouth 2 (two) times daily. 180 tablet 3  . dofetilide (TIKOSYN) 250 MCG capsule TAKE 1 CAPSULE (250 MCG TOTAL) BY MOUTH 2 (TWO) TIMES DAILY. 60 capsule 11  . eplerenone (INSPRA) 50 MG tablet Take 1 tablet (50 mg total) by mouth daily. 90 tablet 3  . furosemide (LASIX) 40 MG tablet Take 60 mg qod and then 40 mg qod 135 tablet 3  . predniSONE (STERAPRED UNI-PAK 21 TAB) 5 MG (21) TBPK tablet Take dosepak as directed 21 tablet 0  . rivaroxaban (XARELTO) 20 MG TABS tablet Take 1 tablet (20 mg total) by mouth daily with supper. 90 tablet 3  . sacubitril-valsartan (ENTRESTO) 49-51 MG Take 1 tablet by mouth 2 (two) times daily. 180 tablet 3   No current facility-administered medications for this visit.      Past Medical History:  Diagnosis Date  . AICD (automatic cardioverter/defibrillator) present   . Anemia    "S/P knee & hip OR"  . Aortic insufficiency    mild   . Aortic root dilatation (HCC)   . Atrial fibrillation (HCC)    "off and on since 2013" (08/05/2014)  . Chronic systolic CHF (congestive heart failure), NYHA class 1 (HCC)   . Complication of anesthesia    hx of irregular beat after anesthesia in ~ 1990's  . Dysrhythmia   . Ejection  fraction < 50%    by echo 08/25/11   . GERD (gastroesophageal reflux disease)   . H/O hematuria   . H/O hiatal hernia   . 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 History of blood transfusion 2013; 2014   S/P knee and hip OR  . Hypertension   . 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 Rheumatoid arthritis (HCC) dx'd 1995    ROS:   All systems reviewed and negative except as noted in the HPI.   Past Surgical History:  Procedure Laterality Date  . CARDIAC CATHETERIZATION  ~ 2004   normal  . CARDIAC CATHETERIZATION N/A 09/07/2014   Procedure: Left Heart Cath and Coronary Angiography;  Surgeon: Laurey Morale, MD;  Location: [facility] INVASIVE CV LAB;  Service: Cardiovascular;  Laterality: N/A;  . CARDIAC DEFIBRILLATOR PLACEMENT  08/05/2014   St. Jude  . CARDIOVERSION N/A 08/21/2013   Procedure: CARDIOVERSION;  Surgeon: Quintella Reichert, MD;  Location: [facility] ENDOSCOPY;  Service: Cardiovascular;  Laterality: N/A;  . COLONOSCOPY  2010; 2015   "polyps; no polyps"  . EP IMPLANTABLE DEVICE N/A 08/05/2014   Procedure: ICD Implant;  Surgeon: [name], MD;  Location[facility] INVASIVE CV LAB;  Service: Cardiovascular;  Laterality: N/A;  .  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 JOINT REPLACEMENT    . NASAL SEPTUM SURGERY  ~ 1975  . TONSILLECTOMY    . TOTAL HIP ARTHROPLASTY  09/08/2011   Procedure: TOTAL HIP ARTHROPLASTY ANTERIOR APPROACH;  Surgeon: [name], MD;  Location: [facility] ORS;  Service: Orthopedics;  Laterality: Right;  Right total hip replacement, left knee steroid injection  . TOTAL KNEE ARTHROPLASTY Left 11/29/2012   Procedure: LEFT TOTAL KNEE ARTHROPLASTY;  Surgeon: [name], MD;  Location: [facility] ORS;  Service: Orthopedics;  Laterality: Left;  FEMORAL NERVE BLOCK IN HOLDING AREA LEFT LEG  . URETHRAL FISTULA REPAIR  ~ 1996  . URETHRAL STRICTURE DILATATION  "several times"     Family History  Problem Relation Age of Onset  .  Heart disease Father   . Heart attack Father 67  . Heart failure Brother      Social History   Socioeconomic History  . Marital status: Married    Spouse name: Not on file  . Number of children: Not on file  . Years of education: Not on file  . Highest education level: Not on file  Occupational History  . Not on file  Social Needs  . Financial resource strain: Not on file  . Food insecurity:    Worry: Not on file    Inability: Not on file  . Transportation needs:    Medical: Not on file    Non-medical: Not on file  Tobacco Use  . Smoking status: Never Smoker  . Smokeless tobacco: Never Used  Substance and Sexual Activity  . Alcohol use: Yes    Alcohol/week: 1.0 standard drinks    Types: 1 Cans of beer per week  . Drug use: No  . Sexual activity: Yes  Lifestyle  . Physical activity:    Days per week: Not on file    Minutes per session: Not on file  . Stress: Not on file  Relationships  . Social connections:    Talks on phone: Not on file    Gets together: Not on file    Attends religious service: Not on file    Active member of club or organization: Not on file    Attends meetings of clubs or organizations: Not on file    Relationship status: Not on file  . Intimate partner violence:    Fear of current or ex partner: Not on file    Emotionally abused: Not on file    Physically abused: Not on file    Forced sexual activity: Not on file  Other Topics Concern  . Not on file  Social History Narrative  . Not on file     BP 118/64   Pulse (!) 53   Ht 5\' 11"  (1.803 m)   Wt 200 lb (90.7 kg)   BMI 27.89 kg/m   Physical Exam:  Well appearing middle aged man,  NAD HEENT: Unremarkable Neck:  No JVD, no thyromegally Lymphatics:  No adenopathy Back:  No CVA tenderness Lungs:  Clear with no wheezes HEART:  IRegular rate rhythm, no murmurs, no rubs, no clicks Abd:  soft, positive bowel sounds, no organomegally, no rebound, no guarding Ext:  2 plus pulses, no  edema, no cyanosis, no clubbing Skin:  No rashes no nodules Neuro:  CN II through XII intact, motor grossly intact  EKG - nsr with bigeminal PVC's  DEVICE  Normal device function.  See PaceArt for details.  Assess/Plan: 1. PVC's - the patient is minimally symptomatic but I am concerned that he could be having over 20K in 24 hours which would increase his risk for developing worse CHF. I have asked him to wear a cardiac monitor for 24 hours. 2. ICD - his St. Jude device is working normally.  3. Atrial fib - he is maintaining NSR with dofetilide. He will continue.  4. Chronic systolic heart failure - his symptoms are class 2. He will continue his current meds.  Leonia Reeves.D.

## 2017-10-12 NOTE — Patient Instructions (Addendum)
Medication Instructions:  Your physician recommends that you continue on your current medications as directed. Please refer to the Current Medication list given to you today.  Labwork: None ordered.  Testing/Procedures: Your physician has recommended that you wear a holter monitor. Holter monitors are medical devices that record the heart's electrical activity. Doctors most often use these monitors to diagnose arrhythmias. Arrhythmias are problems with the speed or rhythm of the heartbeat. The monitor is a small, portable device. You can wear one while you do your normal daily activities. This is usually used to diagnose what is causing palpitations/syncope (passing out).  Please schedule for 24 hour holter monitor.  Follow-Up: Your physician wants you to follow-up in: with Dr. [name] after results of 24 hour holter monitor received.  Remote monitoring is used to monitor your ICD from home. This monitoring reduces the number of office visits required to check your device to one time per year. It allows [place] to keep an eye on the functioning of your device to ensure it is working properly. You are scheduled for a device check from home on 12/10/2017. You may send your transmission at any time that day. If you have a wireless device, the transmission will be sent automatically. After your physician reviews your transmission, you will receive a postcard with your next transmission date.  Any Other Special Instructions Will Be Listed Below (If Applicable).  If you need a refill on your cardiac medications before your next appointment, please call your pharmacy.

## 2017-10-13 LAB — CUP PACEART REMOTE DEVICE CHECK
Date Time Interrogation Session: 20190729060042
HighPow Impedance: 72 Ohm
HighPow Impedance: 72 Ohm
Implantable Lead Location: 753860
Implantable Pulse Generator Implant Date: 20160622
Lead Channel Setting Pacing Amplitude: 2.5 V
Lead Channel Setting Pacing Pulse Width: 0.5 ms
Lead Channel Setting Sensing Sensitivity: 0.5 mV
MDC IDC LEAD IMPLANT DT: 20160622
MDC IDC LEAD SERIAL: 331169
MDC IDC MSMT BATTERY REMAINING LONGEVITY: 79 mo
MDC IDC MSMT BATTERY REMAINING PERCENTAGE: 75 %
MDC IDC MSMT BATTERY VOLTAGE: 2.95 V
MDC IDC MSMT LEADCHNL RV IMPEDANCE VALUE: 390 Ohm
MDC IDC MSMT LEADCHNL RV PACING THRESHOLD AMPLITUDE: 0.75 V
MDC IDC MSMT LEADCHNL RV PACING THRESHOLD PULSEWIDTH: 0.5 ms
MDC IDC MSMT LEADCHNL RV SENSING INTR AMPL: 8.3 mV
MDC IDC STAT BRADY RV PERCENT PACED: 1 %
Pulse Gen Serial Number: 7280026

## 2017-10-17 ENCOUNTER — Encounter (INDEPENDENT_AMBULATORY_CARE_PROVIDER_SITE_OTHER): Payer: Self-pay | Admitting: Orthopaedic Surgery

## 2017-10-17 ENCOUNTER — Ambulatory Visit (INDEPENDENT_AMBULATORY_CARE_PROVIDER_SITE_OTHER): Payer: BC Managed Care – PPO | Admitting: Orthopaedic Surgery

## 2017-10-17 DIAGNOSIS — M1711 Unilateral primary osteoarthritis, right knee: Secondary | ICD-10-CM

## 2017-10-17 NOTE — Progress Notes (Signed)
Terry [name] is well-known to [place].  He has debilitating arthritis involving his right knee.  He has had a previous successful left total knee arthroplasty.  X-rays of his right knee show bone-on-bone where throughout the knee.  He has had a recurrent effusion as well.  His pain is become daily.  The severity of his right knee osteoarthritis is now detrimentally affected his activities of daily living, his quality of life, and his mobility.  At this point he does wish to proceed with a total knee arthroplasty on his right knee given the success he said with his left knee.  A recent steroid taper did calm down some of his inflammation but his swelling did recur and his pain is gotten worse.  He is tried activity modification and using assistive device.  He is work on [employer] well.  He cannot take anti-inflammatories due to being on chronic blood thinning medication which is Xarelto.  He is a regular patient of Dr. Armanda Magic and Dr. Sharrell Ku with [facility].  He has seen both of these physicians in the last month and indicated to them that he would be needing surgery on his right knee.  I was able to review his last office visit with each of those physicians.  On exam his right knee is significantly painful and swollen.  He has patellofemoral crepitation and slight varus malalignment with medial and lateral joint line tenderness.  At this point I do feel that he is a candidate for a right total knee arthroplasty given the success he is had on his left knee.  He would need to likely stop Xarelto 4 days preoperative in order to have a successful regional and spinal anesthesia.  I do feel that he needs at least some kind of cardiac clearance prior to this surgery and he understands this as well.  Once we have an indication that we can proceed safely with surgery from a cardiac standpoint we can schedule this.  All question concerns were answered and addressed.

## 2017-10-25 ENCOUNTER — Other Ambulatory Visit: Payer: Self-pay

## 2017-10-25 ENCOUNTER — Encounter (INDEPENDENT_AMBULATORY_CARE_PROVIDER_SITE_OTHER): Payer: Self-pay | Admitting: Orthopaedic Surgery

## 2017-10-25 ENCOUNTER — Other Ambulatory Visit (INDEPENDENT_AMBULATORY_CARE_PROVIDER_SITE_OTHER): Payer: Self-pay | Admitting: Orthopaedic Surgery

## 2017-10-25 MED ORDER — ACETAMINOPHEN-CODEINE #3 300-30 MG PO TABS: 1.0000 | ORAL_TABLET | Freq: Three times a day (TID) | ORAL | 0 refills | Status: DC | PRN

## 2017-10-25 MED ORDER — RIVAROXABAN 20 MG PO TABS: 20.0000 mg | ORAL_TABLET | Freq: Every day | ORAL | 3 refills | Status: DC

## 2017-10-25 NOTE — Telephone Encounter (Signed)
Pt last saw Dr [name] 10/12/17, last labs 10/09/17 Creat 1.28, age 62, weight 90.7kg, CrCl 76.76.  Based on CrCl pt is on appropriate dosage of Xarelto 20mg  QD.  Will refill rx.

## 2017-10-29 ENCOUNTER — Encounter (INDEPENDENT_AMBULATORY_CARE_PROVIDER_SITE_OTHER): Payer: Self-pay | Admitting: Orthopaedic Surgery

## 2017-10-30 ENCOUNTER — Encounter (INDEPENDENT_AMBULATORY_CARE_PROVIDER_SITE_OTHER): Payer: Self-pay | Admitting: Orthopaedic Surgery

## 2017-10-31 ENCOUNTER — Encounter (INDEPENDENT_AMBULATORY_CARE_PROVIDER_SITE_OTHER): Payer: Self-pay | Admitting: Orthopaedic Surgery

## 2017-11-01 ENCOUNTER — Telehealth: Payer: Self-pay

## 2017-11-01 NOTE — Telephone Encounter (Signed)
   [facility] Pre-operative Risk Assessment    Request for surgical clearance:  1. What type of surgery is being performed? Right total knee arthroplasty  2. When is this surgery scheduled? TBD   3. What type of clearance is required (medical clearance vs. Pharmacy clearance to hold med vs. Both)? Both  4. Are there any medications that need to be held prior to surgery and how long?  Xarelto, Not specified  5. Practice name and name of physician performing surgery?   [facility] , Dr. Zollie Beckers  6. What is your office phone [phone]    7.   What is your office fax number [phone]  8.   Anesthesia type (None, local, MAC, general) ? Not specified   Jacqulynn Cadet 11/01/2017, 4:25 PM  _________________________________________________________________   (provider comments below)

## 2017-11-02 NOTE — Telephone Encounter (Signed)
Attempted to contact pt. LVM to call back 

## 2017-11-06 NOTE — Telephone Encounter (Signed)
Pt takes Xarelto for afib with CHADS2VASc score of 2 (HTN, CHF). CrCl is 36mL/min. Recommend holding Xarelto for 3 days prior to TKA per protocol.

## 2017-11-06 NOTE — Progress Notes (Signed)
Please place orders in Epic as patient has a pre-op appointment on 11/23/2017! Thank you!

## 2017-11-07 ENCOUNTER — Encounter (INDEPENDENT_AMBULATORY_CARE_PROVIDER_SITE_OTHER): Payer: Self-pay | Admitting: Orthopaedic Surgery

## 2017-11-07 NOTE — Telephone Encounter (Signed)
Has cardiac clearance for surgery.  Please schedule his total knee.  Thanks.

## 2017-11-07 NOTE — Telephone Encounter (Signed)
Ok to schedule his total knee replacement.  Has cardiac clearance.  Stop Xarelto 4 days pre-op

## 2017-11-07 NOTE — Telephone Encounter (Signed)
   Primary Cardiologist: Lewayne Bunting, MD  Chart reviewed as part of pre-operative protocol coverage. Given past medical history and time since last visit, based on ACC/AHA guidelines, CADE DASHNER would be at acceptable risk for the planned procedure without further cardiovascular testing.  Pt with hx of ICD, PAF on TIkosyn, with no change in symptoms since last visit,  Recommend holding  Xarelto for 3 days prior to TKA per protocol.    I will route this recommendation to the requesting party via Epic fax function and remove from pre-op pool.  Please call with questions.  Nada Boozer, NP 11/07/2017, 3:58 PM

## 2017-11-10 ENCOUNTER — Encounter (INDEPENDENT_AMBULATORY_CARE_PROVIDER_SITE_OTHER): Payer: Self-pay | Admitting: Orthopedic Surgery

## 2017-11-10 ENCOUNTER — Encounter (INDEPENDENT_AMBULATORY_CARE_PROVIDER_SITE_OTHER): Payer: Self-pay | Admitting: Orthopaedic Surgery

## 2017-11-12 ENCOUNTER — Other Ambulatory Visit (HOSPITAL_COMMUNITY): Payer: Self-pay | Admitting: Cardiology

## 2017-11-12 ENCOUNTER — Other Ambulatory Visit (INDEPENDENT_AMBULATORY_CARE_PROVIDER_SITE_OTHER): Payer: Self-pay | Admitting: Orthopaedic Surgery

## 2017-11-12 ENCOUNTER — Telehealth (INDEPENDENT_AMBULATORY_CARE_PROVIDER_SITE_OTHER): Payer: Self-pay | Admitting: Orthopaedic Surgery

## 2017-11-12 MED ORDER — HYDROCODONE-ACETAMINOPHEN 5-325 MG PO TABS: 1.0000 | ORAL_TABLET | Freq: Four times a day (QID) | ORAL | 0 refills | Status: DC | PRN

## 2017-11-12 NOTE — Telephone Encounter (Signed)
Patient called needing something for pain. Patient said his knee is swollen ans stiff. Patient asked if there is a medication that he can take to get him by until his surgery on the 11/30/17. [facility] [address] inside of [employer].The number to contact patient is [phone]

## 2017-11-12 NOTE — Telephone Encounter (Signed)
Please advise. Thanks.  

## 2017-11-12 NOTE — Progress Notes (Signed)
I sent it in again 

## 2017-11-12 NOTE — Telephone Encounter (Signed)
Terry [name]

## 2017-11-18 ENCOUNTER — Encounter (INDEPENDENT_AMBULATORY_CARE_PROVIDER_SITE_OTHER): Payer: Self-pay | Admitting: Orthopedic Surgery

## 2017-11-19 ENCOUNTER — Other Ambulatory Visit (INDEPENDENT_AMBULATORY_CARE_PROVIDER_SITE_OTHER): Payer: Self-pay

## 2017-11-19 DIAGNOSIS — M25522 Pain in left elbow: Secondary | ICD-10-CM

## 2017-11-19 NOTE — Telephone Encounter (Signed)
Bill grammig thx

## 2017-11-20 ENCOUNTER — Other Ambulatory Visit (INDEPENDENT_AMBULATORY_CARE_PROVIDER_SITE_OTHER): Payer: Self-pay

## 2017-11-20 ENCOUNTER — Other Ambulatory Visit (INDEPENDENT_AMBULATORY_CARE_PROVIDER_SITE_OTHER): Payer: Self-pay | Admitting: Physician Assistant

## 2017-11-21 ENCOUNTER — Other Ambulatory Visit (INDEPENDENT_AMBULATORY_CARE_PROVIDER_SITE_OTHER): Payer: Self-pay | Admitting: Physician Assistant

## 2017-11-22 NOTE — Progress Notes (Signed)
CARDIAC CLEARANCE , SEE TELE NOTE 11-01-17 Epic   ICD DEVICE ORDERS ON CHART   EKG 10-12-17 Epic   ECHO 10-09-17 Epic

## 2017-11-22 NOTE — Patient Instructions (Signed)
DAYSON ABOUD  11/22/2017   Your procedure is scheduled on: 11-30-17   Report to [facility] Main  Entrance    Report to admitting at 5:30AM    Call this number if you have problems the morning of surgery [phone]     Remember: Do not eat food or drink liquids :After Midnight. BRUSH YOUR TEETH MORNING OF SURGERY AND RINSE YOUR MOUTH OUT, NO CHEWING GUM CANDY OR MINTS.     Take these medicines the morning of surgery with A SIP OF WATER:  CARVEDILOL, TIKOSYN                                 You may not have any metal on your body including hair pins and              piercings  Do not wear jewelry, make-up, lotions, powders or perfumes, deodorant                Men may shave face and neck.   Do not bring valuables to the hospital. [facility] IS NOT             RESPONSIBLE   FOR VALUABLES.  Contacts, dentures or bridgework may not be worn into surgery.  Leave suitcase in the car. After surgery it may be brought to your room.                 Please read over the following fact sheets you were given: _____________________________________________________________________             [facility] - Preparing for Surgery Before surgery, you can play an important role.  Because skin is not sterile, your skin needs to be as free of germs as possible.  You can reduce the number of germs on your skin by washing with CHG (chlorahexidine gluconate) soap before surgery.  CHG is an antiseptic cleaner which kills germs and bonds with the skin to continue killing germs even after washing. Please DO NOT use if you have an allergy to CHG or antibacterial soaps.  If your skin becomes reddened/irritated stop using the CHG and inform your nurse when you arrive at Short Stay. Do not shave (including legs and underarms) for at least 48 hours prior to the first CHG shower.  You may shave your face/neck. Please follow these instructions carefully:  1.  Shower with CHG Soap the  night before surgery and the  morning of Surgery.  2.  If you choose to wash your hair, wash your hair first as usual with your  normal  shampoo.  3.  After you shampoo, rinse your hair and body thoroughly to remove the  shampoo.                           4.  Use CHG as you would any other liquid soap.  You can apply chg directly  to the skin and wash                       Gently with a scrungie or clean washcloth.  5.  Apply the CHG Soap to your body ONLY FROM THE NECK DOWN.   Do not use on face/ open  Wound or open sores. Avoid contact with eyes, ears mouth and genitals (private parts).                       Wash face,  Genitals (private parts) with your normal soap.             6.  Wash thoroughly, paying special attention to the area where your surgery  will be performed.  7.  Thoroughly rinse your body with warm water from the neck down.  8.  DO NOT shower/wash with your normal soap after using and rinsing off  the CHG Soap.                9.  Pat yourself dry with a clean towel.            10.  Wear clean pajamas.            11.  Place clean sheets on your bed the night of your first shower and do not  sleep with pets. Day of Surgery : Do not apply any lotions/deodorants the morning of surgery.  Please wear clean clothes to the hospital/surgery center.  FAILURE TO FOLLOW THESE INSTRUCTIONS MAY RESULT IN THE CANCELLATION OF YOUR SURGERY PATIENT SIGNATURE_________________________________  NURSE SIGNATURE__________________________________  ________________________________________________________________________   Adam Phenix  An incentive spirometer is a tool that can help keep your lungs clear and active. This tool measures how well you are filling your lungs with each breath. Taking long deep breaths may help reverse or decrease the chance of developing breathing (pulmonary) problems (especially infection) following:  A long period of time when you  are unable to move or be active. BEFORE THE PROCEDURE   If the spirometer includes an indicator to show your best effort, your nurse or respiratory therapist will set it to a desired goal.  If possible, sit up straight or lean slightly forward. Try not to slouch.  Hold the incentive spirometer in an upright position. INSTRUCTIONS FOR USE  1. Sit on the edge of your bed if possible, or sit up as far as you can in bed or on a chair. 2. Hold the incentive spirometer in an upright position. 3. Breathe out normally. 4. Place the mouthpiece in your mouth and seal your lips tightly around it. 5. Breathe in slowly and as deeply as possible, raising the piston or the ball toward the top of the column. 6. Hold your breath for 3-5 seconds or for as long as possible. Allow the piston or ball to fall to the bottom of the column. 7. Remove the mouthpiece from your mouth and breathe out normally. 8. Rest for a few seconds and repeat Steps 1 through 7 at least 10 times every 1-2 hours when you are awake. Take your time and take a few normal breaths between deep breaths. 9. The spirometer may include an indicator to show your best effort. Use the indicator as a goal to work toward during each repetition. 10. After each set of 10 deep breaths, practice coughing to be sure your lungs are clear. If you have an incision (the cut made at the time of surgery), support your incision when coughing by placing a pillow or rolled up towels firmly against it. Once you are able to get out of bed, walk around indoors and cough well. You may stop using the incentive spirometer when instructed by your caregiver.  RISKS AND COMPLICATIONS  Take your time so you do not get  dizzy or light-headed.  If you are in pain, you may need to take or ask for pain medication before doing incentive spirometry. It is harder to take a deep breath if you are having pain. AFTER USE  Rest and breathe slowly and easily.  It can be helpful to  keep track of a log of your progress. Your caregiver can provide you with a simple table to help with this. If you are using the spirometer at home, follow these instructions: [place] IF:   You are having difficultly using the spirometer.  You have trouble using the spirometer as often as instructed.  Your pain medication is not giving enough relief while using the spirometer.  You develop fever of 100.5 F (38.1 C) or higher. SEEK IMMEDIATE MEDICAL CARE IF:   You cough up bloody sputum that had not been present before.  You develop fever of 102 F (38.9 C) or greater.  You develop worsening pain at or near the incision site. MAKE SURE YOU:   Understand these instructions.  Will watch your condition.  Will get help right away if you are not doing well or get worse. Document Released: 06/12/2006 Document Revised: 04/24/2011 Document Reviewed: 08/13/2006 [facility] Patient Information 2014 [place], [place].   ________________________________________________________________________

## 2017-11-23 ENCOUNTER — Encounter (HOSPITAL_COMMUNITY): Payer: Self-pay

## 2017-11-23 ENCOUNTER — Encounter (HOSPITAL_COMMUNITY)
Admission: RE | Admit: 2017-11-23 | Discharge: 2017-11-23 | Disposition: A | Discharge status: Home / Self Care | Payer: BC Managed Care – PPO | Source: Ambulatory Visit | Attending: Orthopaedic Surgery | Admitting: Orthopaedic Surgery

## 2017-11-23 ENCOUNTER — Other Ambulatory Visit: Payer: Self-pay

## 2017-11-23 DIAGNOSIS — Z01818 Encounter for other preprocedural examination: Secondary | ICD-10-CM | POA: Diagnosis not present

## 2017-11-23 DIAGNOSIS — M1711 Unilateral primary osteoarthritis, right knee: Secondary | ICD-10-CM | POA: Diagnosis not present

## 2017-11-23 LAB — BASIC METABOLIC PANEL
ANION GAP: 10 (ref 5–15)
BUN: 29 mg/dL — ABNORMAL HIGH (ref 8–23)
CHLORIDE: 105 mmol/L (ref 98–111)
CO2: 28 mmol/L (ref 22–32)
Calcium: 9.6 mg/dL (ref 8.9–10.3)
Creatinine, Ser: 1.39 mg/dL — ABNORMAL HIGH (ref 0.61–1.24)
GFR calc Af Amer: >60 mL/min (ref >60)
GFR calc non Af Amer: 53 mL/min — ABNORMAL LOW (ref >60)
Glucose, Bld: 102 mg/dL — ABNORMAL HIGH (ref 70–99)
POTASSIUM: 5.1 mmol/L (ref 3.5–5.1)
SODIUM: 143 mmol/L (ref 135–145)

## 2017-11-23 LAB — CBC
HCT: 36.9 % — ABNORMAL LOW (ref 39.0–52.0)
HEMOGLOBIN: 11.7 g/dL — AB (ref 13.0–17.0)
MCH: 27.8 pg (ref 26.0–34.0)
MCHC: 31.7 g/dL (ref 30.0–36.0)
MCV: 87.6 fL (ref 80.0–100.0)
NRBC: 0 % (ref 0.0–0.2)
Platelets: 310 10*3/uL (ref 150–400)
RBC: 4.21 MIL/uL — AB (ref 4.22–5.81)
RDW: 12.7 % (ref 11.5–15.5)
WBC: 11 10*3/uL — AB (ref 4.0–10.5)

## 2017-11-23 LAB — SURGICAL PCR SCREEN
MRSA, PCR: NEGATIVE
STAPHYLOCOCCUS AUREUS: NEGATIVE

## 2017-11-29 NOTE — Anesthesia Preprocedure Evaluation (Addendum)
Anesthesia Evaluation  Patient identified by MRN, date of birth, ID band Patient awake    Reviewed: Allergy & Precautions, NPO status , Patient's Chart, lab work & pertinent test results, reviewed documented beta blocker date and time   Airway Mallampati: II  TM Distance: >3 FB Neck ROM: Full    Dental no notable dental hx.    Pulmonary neg pulmonary ROS,    Pulmonary exam normal breath sounds clear to auscultation       Cardiovascular hypertension, Pt. on home beta blockers +CHF  Normal cardiovascular exam+ dysrhythmias Atrial Fibrillation + Cardiac Defibrillator (St Jude) + Valvular Problems/Murmurs AI and MR  Rhythm:Regular Rate:Normal  ECG: SB, rate 53. PVC's  Pre-op eval per cardiologist [name])  ECHO: Left ventricle: The cavity size was mildly dilated. Wall thickness was normal. Systolic function was mildly to moderately reduced. The estimated ejection fraction was in the range of 40% to 45%. Mild diffuse hypokinesis with no identifiable regional variations. Doppler parameters are consistent with abnormal left ventricular relaxation (grade 1 diastolic dysfunction). No evidence of thrombus. Aortic valve: There was mild regurgitation. Mitral valve: There was mild to moderate regurgitation directed centrally. Left atrium: The atrium was mildly dilated. Right ventricle: Systolic function was mildly to moderately reduced.   Neuro/Psych negative neurological ROS     GI/Hepatic Neg liver ROS, hiatal hernia,   Endo/Other  negative endocrine ROS  Renal/GU negative Renal ROS     Musculoskeletal  (+) Arthritis , Osteoarthritis and Rheumatoid disorders,    Abdominal   Peds  Hematology  (+) anemia ,   Anesthesia Other Findings Day of surgery medications reviewed with the patient.  Reproductive/Obstetrics                            Anesthesia Physical Anesthesia Plan  ASA: III  Anesthesia  Plan: Regional and Spinal   Post-op Pain Management:  Regional for Post-op pain   Induction: Intravenous  PONV Risk Score and Plan: 1 and Midazolam, Dexamethasone, Ondansetron and Treatment may vary due to age or medical condition  Airway Management Planned: Natural Airway  Additional Equipment:   Intra-op Plan:   Post-operative Plan:   Informed Consent: I have reviewed the patients History and Physical, chart, labs and discussed the procedure including the risks, benefits and alternatives for the proposed anesthesia with the patient or authorized representative who has indicated his/her understanding and acceptance.   Dental advisory given  Plan Discussed with: CRNA  Anesthesia Plan Comments:         Anesthesia Quick Evaluation

## 2017-11-30 ENCOUNTER — Inpatient Hospital Stay (HOSPITAL_COMMUNITY): Payer: BC Managed Care – PPO

## 2017-11-30 ENCOUNTER — Encounter (HOSPITAL_COMMUNITY)
Admission: RE | Disposition: A | Discharge status: Home Health | Payer: Self-pay | Source: Home / Self Care | Attending: Orthopaedic Surgery

## 2017-11-30 ENCOUNTER — Encounter (HOSPITAL_COMMUNITY): Payer: Self-pay | Admitting: *Deleted

## 2017-11-30 ENCOUNTER — Inpatient Hospital Stay (HOSPITAL_COMMUNITY)
Admission: RE | Admit: 2017-11-30 | Discharge: 2017-12-02 | DRG: 470 | Disposition: A | Discharge status: Home Health | Payer: BC Managed Care – PPO | Attending: Orthopaedic Surgery | Admitting: Orthopaedic Surgery

## 2017-11-30 ENCOUNTER — Other Ambulatory Visit: Payer: Self-pay

## 2017-11-30 ENCOUNTER — Inpatient Hospital Stay (HOSPITAL_COMMUNITY): Payer: BC Managed Care – PPO | Admitting: Anesthesiology

## 2017-11-30 DIAGNOSIS — I420 Dilated cardiomyopathy: Secondary | ICD-10-CM | POA: Diagnosis present

## 2017-11-30 DIAGNOSIS — Z8249 Family history of ischemic heart disease and other diseases of the circulatory system: Secondary | ICD-10-CM

## 2017-11-30 DIAGNOSIS — M069 Rheumatoid arthritis, unspecified: Secondary | ICD-10-CM | POA: Diagnosis present

## 2017-11-30 DIAGNOSIS — M659 Synovitis and tenosynovitis, unspecified: Secondary | ICD-10-CM | POA: Diagnosis present

## 2017-11-30 DIAGNOSIS — Z96652 Presence of left artificial knee joint: Secondary | ICD-10-CM | POA: Diagnosis present

## 2017-11-30 DIAGNOSIS — Z888 Allergy status to other drugs, medicaments and biological substances status: Secondary | ICD-10-CM | POA: Diagnosis not present

## 2017-11-30 DIAGNOSIS — R001 Bradycardia, unspecified: Secondary | ICD-10-CM | POA: Diagnosis present

## 2017-11-30 DIAGNOSIS — Z95810 Presence of automatic (implantable) cardiac defibrillator: Secondary | ICD-10-CM

## 2017-11-30 DIAGNOSIS — I493 Ventricular premature depolarization: Secondary | ICD-10-CM | POA: Diagnosis present

## 2017-11-30 DIAGNOSIS — Z96651 Presence of right artificial knee joint: Secondary | ICD-10-CM | POA: Diagnosis not present

## 2017-11-30 DIAGNOSIS — I428 Other cardiomyopathies: Secondary | ICD-10-CM | POA: Diagnosis not present

## 2017-11-30 DIAGNOSIS — M1610 Unilateral primary osteoarthritis, unspecified hip: Secondary | ICD-10-CM | POA: Diagnosis present

## 2017-11-30 DIAGNOSIS — I110 Hypertensive heart disease with heart failure: Secondary | ICD-10-CM | POA: Diagnosis present

## 2017-11-30 DIAGNOSIS — I4819 Other persistent atrial fibrillation: Secondary | ICD-10-CM | POA: Diagnosis present

## 2017-11-30 DIAGNOSIS — K219 Gastro-esophageal reflux disease without esophagitis: Secondary | ICD-10-CM | POA: Diagnosis present

## 2017-11-30 DIAGNOSIS — M1711 Unilateral primary osteoarthritis, right knee: Secondary | ICD-10-CM | POA: Diagnosis not present

## 2017-11-30 DIAGNOSIS — I5022 Chronic systolic (congestive) heart failure: Secondary | ICD-10-CM | POA: Diagnosis present

## 2017-11-30 DIAGNOSIS — M25561 Pain in right knee: Secondary | ICD-10-CM | POA: Diagnosis not present

## 2017-11-30 DIAGNOSIS — Z96641 Presence of right artificial hip joint: Secondary | ICD-10-CM | POA: Diagnosis present

## 2017-11-30 DIAGNOSIS — M170 Bilateral primary osteoarthritis of knee: Secondary | ICD-10-CM | POA: Diagnosis present

## 2017-11-30 DIAGNOSIS — I351 Nonrheumatic aortic (valve) insufficiency: Secondary | ICD-10-CM | POA: Diagnosis present

## 2017-11-30 DIAGNOSIS — R008 Other abnormalities of heart beat: Secondary | ICD-10-CM | POA: Diagnosis not present

## 2017-11-30 DIAGNOSIS — Z882 Allergy status to sulfonamides status: Secondary | ICD-10-CM | POA: Diagnosis not present

## 2017-11-30 HISTORY — PX: TOTAL KNEE ARTHROPLASTY: SHX125

## 2017-11-30 LAB — PROTIME-INR
INR: 1.04
PROTHROMBIN TIME: 13.5 s (ref 11.4–15.2)

## 2017-11-30 SURGERY — ARTHROPLASTY, KNEE, TOTAL
Anesthesia: Regional | Site: Knee | Laterality: Right

## 2017-11-30 MED ORDER — STERILE WATER FOR IRRIGATION IR SOLN
Status: DC | PRN
  Administered 2017-11-30: 2000 mL

## 2017-11-30 MED ORDER — METOCLOPRAMIDE HCL 5 MG PO TABS: 5.0000 mg | ORAL_TABLET | Freq: Three times a day (TID) | ORAL | Status: DC | PRN

## 2017-11-30 MED ORDER — DOCUSATE SODIUM 100 MG PO CAPS
100.0000 mg | ORAL_CAPSULE | Freq: Two times a day (BID) | ORAL | Status: DC
  Administered 2017-11-30 – 2017-12-02 (×5): 100 mg via ORAL
  Filled 2017-11-30 (×5): qty 1

## 2017-11-30 MED ORDER — EPHEDRINE SULFATE-NACL 50-0.9 MG/10ML-% IV SOSY
PREFILLED_SYRINGE | INTRAVENOUS | Status: DC | PRN
  Administered 2017-11-30: 10 mg via INTRAVENOUS

## 2017-11-30 MED ORDER — MENTHOL 3 MG MT LOZG
1.0000 | LOZENGE | OROMUCOSAL | Status: DC | PRN
  Filled 2017-11-30: qty 9

## 2017-11-30 MED ORDER — POLYETHYLENE GLYCOL 3350 17 G PO PACK
17.0000 g | PACK | Freq: Every day | ORAL | Status: DC | PRN
  Administered 2017-12-02: 17 g via ORAL
  Filled 2017-11-30: qty 1

## 2017-11-30 MED ORDER — EPHEDRINE 5 MG/ML INJ
INTRAVENOUS | Status: AC
  Filled 2017-11-30: qty 10

## 2017-11-30 MED ORDER — DIPHENHYDRAMINE HCL 12.5 MG/5ML PO ELIX: 12.5000 mg | ORAL_SOLUTION | ORAL | Status: DC | PRN

## 2017-11-30 MED ORDER — MIDAZOLAM HCL 2 MG/2ML IJ SOLN
INTRAMUSCULAR | Status: AC
  Filled 2017-11-30: qty 2

## 2017-11-30 MED ORDER — METHOCARBAMOL 500 MG PO TABS
500.0000 mg | ORAL_TABLET | Freq: Four times a day (QID) | ORAL | Status: DC | PRN
  Administered 2017-11-30 – 2017-12-01 (×3): 500 mg via ORAL
  Filled 2017-11-30 (×4): qty 1

## 2017-11-30 MED ORDER — ONDANSETRON HCL 4 MG/2ML IJ SOLN
INTRAMUSCULAR | Status: AC
  Filled 2017-11-30: qty 2

## 2017-11-30 MED ORDER — OXYCODONE HCL 5 MG PO TABS
10.0000 mg | ORAL_TABLET | ORAL | Status: DC | PRN
  Administered 2017-11-30: 15 mg via ORAL
  Administered 2017-11-30: 10 mg via ORAL
  Administered 2017-11-30: 15 mg via ORAL
  Administered 2017-11-30: 10 mg via ORAL
  Filled 2017-11-30: qty 3
  Filled 2017-11-30: qty 2
  Filled 2017-11-30: qty 3

## 2017-11-30 MED ORDER — FENTANYL CITRATE (PF) 100 MCG/2ML IJ SOLN
25.0000 ug | INTRAMUSCULAR | Status: DC | PRN
  Administered 2017-11-30 (×3): 50 ug via INTRAVENOUS

## 2017-11-30 MED ORDER — DEXTROSE 5 % IV SOLN: 3.0000 g | INTRAVENOUS | Status: DC

## 2017-11-30 MED ORDER — PANTOPRAZOLE SODIUM 40 MG PO TBEC
40.0000 mg | DELAYED_RELEASE_TABLET | Freq: Every day | ORAL | Status: DC
  Administered 2017-11-30 – 2017-12-02 (×3): 40 mg via ORAL
  Filled 2017-11-30 (×3): qty 1

## 2017-11-30 MED ORDER — SODIUM CHLORIDE 0.9 % IV SOLN
INTRAVENOUS | Status: DC | PRN
  Administered 2017-11-30: 50 ug/min via INTRAVENOUS

## 2017-11-30 MED ORDER — SPIRONOLACTONE 25 MG PO TABS
25.0000 mg | ORAL_TABLET | Freq: Every day | ORAL | Status: DC
  Administered 2017-11-30: 25 mg via ORAL
  Filled 2017-11-30 (×2): qty 1

## 2017-11-30 MED ORDER — ROPIVACAINE HCL 5 MG/ML IJ SOLN
INTRAMUSCULAR | Status: DC | PRN
  Administered 2017-11-30: 30 mL via PERINEURAL

## 2017-11-30 MED ORDER — SODIUM CHLORIDE 0.9 % IJ SOLN
INTRAMUSCULAR | Status: DC | PRN
  Administered 2017-11-30: 20 mL

## 2017-11-30 MED ORDER — BUPIVACAINE IN DEXTROSE 0.75-8.25 % IT SOLN
INTRATHECAL | Status: DC | PRN
  Administered 2017-11-30: 1.6 mL via INTRATHECAL

## 2017-11-30 MED ORDER — RIVAROXABAN 20 MG PO TABS
20.0000 mg | ORAL_TABLET | Freq: Every day | ORAL | Status: DC
  Administered 2017-12-01: 20 mg via ORAL
  Filled 2017-11-30: qty 2

## 2017-11-30 MED ORDER — ONDANSETRON HCL 4 MG/2ML IJ SOLN: 4.0000 mg | Freq: Once | INTRAMUSCULAR | Status: DC | PRN

## 2017-11-30 MED ORDER — TRANEXAMIC ACID-NACL 1000-0.7 MG/100ML-% IV SOLN
1000.0000 mg | INTRAVENOUS | Status: AC
  Administered 2017-11-30: 1000 mg via INTRAVENOUS
  Filled 2017-11-30: qty 100

## 2017-11-30 MED ORDER — CHLORHEXIDINE GLUCONATE 4 % EX LIQD: 60.0000 mL | Freq: Once | CUTANEOUS | Status: DC

## 2017-11-30 MED ORDER — FUROSEMIDE 40 MG PO TABS
60.0000 mg | ORAL_TABLET | ORAL | Status: DC
  Administered 2017-12-01: 60 mg via ORAL
  Filled 2017-11-30: qty 1

## 2017-11-30 MED ORDER — DOFETILIDE 250 MCG PO CAPS
250.0000 ug | ORAL_CAPSULE | Freq: Two times a day (BID) | ORAL | Status: DC
  Administered 2017-11-30 – 2017-12-02 (×4): 250 ug via ORAL
  Filled 2017-11-30 (×4): qty 1

## 2017-11-30 MED ORDER — ACETAMINOPHEN 10 MG/ML IV SOLN
INTRAVENOUS | Status: AC
  Filled 2017-11-30: qty 100

## 2017-11-30 MED ORDER — METHOCARBAMOL 500 MG IVPB - SIMPLE MED
500.0000 mg | Freq: Four times a day (QID) | INTRAVENOUS | Status: DC | PRN
  Administered 2017-11-30: 500 mg via INTRAVENOUS
  Filled 2017-11-30: qty 50

## 2017-11-30 MED ORDER — SODIUM CHLORIDE 0.9 % IJ SOLN
INTRAMUSCULAR | Status: AC
  Filled 2017-11-30: qty 20

## 2017-11-30 MED ORDER — CARVEDILOL 25 MG PO TABS
25.0000 mg | ORAL_TABLET | Freq: Two times a day (BID) | ORAL | Status: DC
  Administered 2017-11-30 – 2017-12-01 (×2): 25 mg via ORAL
  Filled 2017-11-30 (×3): qty 1

## 2017-11-30 MED ORDER — LIDOCAINE 2% (20 MG/ML) 5 ML SYRINGE
INTRAMUSCULAR | Status: AC
  Filled 2017-11-30: qty 5

## 2017-11-30 MED ORDER — ONDANSETRON HCL 4 MG/2ML IJ SOLN: 4.0000 mg | Freq: Four times a day (QID) | INTRAMUSCULAR | Status: DC | PRN

## 2017-11-30 MED ORDER — FENTANYL CITRATE (PF) 100 MCG/2ML IJ SOLN
INTRAMUSCULAR | Status: DC | PRN
  Administered 2017-11-30 (×2): 50 ug via INTRAVENOUS

## 2017-11-30 MED ORDER — PROPOFOL 500 MG/50ML IV EMUL
INTRAVENOUS | Status: DC | PRN
  Administered 2017-11-30: 50 ug/kg/min via INTRAVENOUS

## 2017-11-30 MED ORDER — FUROSEMIDE 40 MG PO TABS
40.0000 mg | ORAL_TABLET | ORAL | Status: DC
  Administered 2017-11-30 – 2017-12-02 (×2): 40 mg via ORAL
  Filled 2017-11-30 (×2): qty 1

## 2017-11-30 MED ORDER — ALUM & MAG HYDROXIDE-SIMETH 200-200-20 MG/5ML PO SUSP: 30.0000 mL | ORAL | Status: DC | PRN

## 2017-11-30 MED ORDER — HYDROMORPHONE HCL 1 MG/ML IJ SOLN
0.5000 mg | INTRAMUSCULAR | Status: DC | PRN
  Administered 2017-11-30 (×2): 1 mg via INTRAVENOUS
  Filled 2017-11-30 (×2): qty 1

## 2017-11-30 MED ORDER — MIDAZOLAM HCL 5 MG/5ML IJ SOLN
INTRAMUSCULAR | Status: DC | PRN
  Administered 2017-11-30: 2 mg via INTRAVENOUS

## 2017-11-30 MED ORDER — FENTANYL CITRATE (PF) 100 MCG/2ML IJ SOLN
INTRAMUSCULAR | Status: AC
  Filled 2017-11-30: qty 2

## 2017-11-30 MED ORDER — LACTATED RINGERS IV SOLN
INTRAVENOUS | Status: DC
  Administered 2017-11-30: 08:00:00 via INTRAVENOUS
  Administered 2017-11-30: 1000 mL via INTRAVENOUS
  Administered 2017-11-30: 06:00:00 via INTRAVENOUS

## 2017-11-30 MED ORDER — PROPOFOL 10 MG/ML IV BOLUS
INTRAVENOUS | Status: AC
  Filled 2017-11-30: qty 60

## 2017-11-30 MED ORDER — PHENOL 1.4 % MT LIQD
1.0000 | OROMUCOSAL | Status: DC | PRN
  Filled 2017-11-30: qty 177

## 2017-11-30 MED ORDER — CEFAZOLIN SODIUM-DEXTROSE 1-4 GM/50ML-% IV SOLN
1.0000 g | Freq: Four times a day (QID) | INTRAVENOUS | Status: AC
  Administered 2017-11-30 (×2): 1 g via INTRAVENOUS
  Filled 2017-11-30 (×2): qty 50

## 2017-11-30 MED ORDER — SODIUM CHLORIDE 0.9 % IR SOLN
Status: DC | PRN
  Administered 2017-11-30: 1000 mL

## 2017-11-30 MED ORDER — SODIUM CHLORIDE 0.9 % IV SOLN
INTRAVENOUS | Status: DC
  Administered 2017-11-30: 11:00:00 via INTRAVENOUS

## 2017-11-30 MED ORDER — DEXAMETHASONE SODIUM PHOSPHATE 10 MG/ML IJ SOLN
INTRAMUSCULAR | Status: AC
  Filled 2017-11-30: qty 1

## 2017-11-30 MED ORDER — PHENYLEPHRINE HCL 10 MG/ML IJ SOLN
INTRAMUSCULAR | Status: AC
  Filled 2017-11-30: qty 1

## 2017-11-30 MED ORDER — ACETAMINOPHEN 325 MG PO TABS: 325.0000 mg | ORAL_TABLET | Freq: Four times a day (QID) | ORAL | Status: DC | PRN

## 2017-11-30 MED ORDER — CEFAZOLIN SODIUM-DEXTROSE 2-4 GM/100ML-% IV SOLN
2.0000 g | INTRAVENOUS | Status: AC
  Administered 2017-11-30: 2 g via INTRAVENOUS
  Filled 2017-11-30: qty 100

## 2017-11-30 MED ORDER — BUPIVACAINE LIPOSOME 1.3 % IJ SUSP
20.0000 mL | Freq: Once | INTRAMUSCULAR | Status: AC
  Administered 2017-11-30: 20 mL
  Filled 2017-11-30: qty 20

## 2017-11-30 MED ORDER — ACETAMINOPHEN 10 MG/ML IV SOLN
1000.0000 mg | Freq: Once | INTRAVENOUS | Status: AC
  Administered 2017-11-30: 1000 mg via INTRAVENOUS

## 2017-11-30 MED ORDER — LIDOCAINE 2% (20 MG/ML) 5 ML SYRINGE
INTRAMUSCULAR | Status: DC | PRN
  Administered 2017-11-30: 60 mg via INTRAVENOUS

## 2017-11-30 MED ORDER — DEXAMETHASONE SODIUM PHOSPHATE 10 MG/ML IJ SOLN
INTRAMUSCULAR | Status: DC | PRN
  Administered 2017-11-30: 10 mg via INTRAVENOUS

## 2017-11-30 MED ORDER — ONDANSETRON HCL 4 MG PO TABS: 4.0000 mg | ORAL_TABLET | Freq: Four times a day (QID) | ORAL | Status: DC | PRN

## 2017-11-30 MED ORDER — OXYCODONE HCL 5 MG PO TABS
5.0000 mg | ORAL_TABLET | ORAL | Status: DC | PRN
  Administered 2017-12-01 – 2017-12-02 (×5): 10 mg via ORAL
  Filled 2017-11-30 (×6): qty 2

## 2017-11-30 MED ORDER — METOCLOPRAMIDE HCL 5 MG/ML IJ SOLN: 5.0000 mg | Freq: Three times a day (TID) | INTRAMUSCULAR | Status: DC | PRN

## 2017-11-30 MED ORDER — SACUBITRIL-VALSARTAN 49-51 MG PO TABS
1.0000 | ORAL_TABLET | Freq: Two times a day (BID) | ORAL | Status: DC
  Administered 2017-11-30 – 2017-12-02 (×5): 1 via ORAL
  Filled 2017-11-30 (×5): qty 1

## 2017-11-30 MED ORDER — METHOCARBAMOL 500 MG IVPB - SIMPLE MED
INTRAVENOUS | Status: AC
  Administered 2017-11-30: 500 mg
  Filled 2017-11-30: qty 50

## 2017-11-30 MED ORDER — 0.9 % SODIUM CHLORIDE (POUR BTL) OPTIME
TOPICAL | Status: DC | PRN
  Administered 2017-11-30: 1000 mL

## 2017-11-30 MED ORDER — ZOLPIDEM TARTRATE 5 MG PO TABS
5.0000 mg | ORAL_TABLET | Freq: Every evening | ORAL | Status: DC | PRN
  Administered 2017-12-02: 5 mg via ORAL
  Filled 2017-11-30: qty 1

## 2017-11-30 MED ORDER — TRANEXAMIC ACID-NACL 1000-0.7 MG/100ML-% IV SOLN: 1000.0000 mg | INTRAVENOUS | Status: DC

## 2017-11-30 MED ORDER — ONDANSETRON HCL 4 MG/2ML IJ SOLN
INTRAMUSCULAR | Status: DC | PRN
  Administered 2017-11-30: 4 mg via INTRAVENOUS

## 2017-11-30 SURGICAL SUPPLY — 60 items
APL SKNCLS STERI-STRIP NONHPOA (GAUZE/BANDAGES/DRESSINGS)
BAG SPEC THK2 15X12 ZIP CLS (MISCELLANEOUS)
BAG ZIPLOCK 12X15 (MISCELLANEOUS) IMPLANT
BANDAGE ACE 6X5 VEL STRL LF (GAUZE/BANDAGES/DRESSINGS) ×3 IMPLANT
BANDAGE ELASTIC 6 VELCRO ST LF (GAUZE/BANDAGES/DRESSINGS) ×2 IMPLANT
BEARIN INSERT TIBIAL SZ5 9 (Orthopedic Implant) ×2 IMPLANT
BEARIN INSERT TIBIAL SZ5 9MM (Orthopedic Implant) ×1 IMPLANT
BEARING INSERT TIBIAL SZ5 9 (Orthopedic Implant) IMPLANT
BENZOIN TINCTURE PRP APPL 2/3 (GAUZE/BANDAGES/DRESSINGS) IMPLANT
BLADE SAG 18X100X1.27 (BLADE) ×2 IMPLANT
BOWL SMART MIX CTS (DISPOSABLE) IMPLANT
BSPLAT TIB 5 KN TRITANIUM (Knees) ×1 IMPLANT
CLOSURE STERI-STRIP 1/2X4 (GAUZE/BANDAGES/DRESSINGS) ×1
CLOSURE WOUND 1/2 X4 (GAUZE/BANDAGES/DRESSINGS)
CLSR STERI-STRIP ANTIMIC 1/2X4 (GAUZE/BANDAGES/DRESSINGS) ×1 IMPLANT
COVER SURGICAL LIGHT HANDLE (MISCELLANEOUS) ×3 IMPLANT
COVER WAND RF STERILE (DRAPES) ×2 IMPLANT
CUFF TOURN SGL QUICK 34 (TOURNIQUET CUFF) ×3
CUFF TRNQT CYL 34X4X40X1 (TOURNIQUET CUFF) ×1 IMPLANT
DECANTER SPIKE VIAL GLASS SM (MISCELLANEOUS) ×2 IMPLANT
DRAPE U-SHAPE 47X51 STRL (DRAPES) ×3 IMPLANT
DRSG PAD ABDOMINAL 8X10 ST (GAUZE/BANDAGES/DRESSINGS) ×3 IMPLANT
DURAPREP 26ML APPLICATOR (WOUND CARE) ×3 IMPLANT
ELECT REM PT RETURN 15FT ADLT (MISCELLANEOUS) ×3 IMPLANT
FEMORAL POSTERIOR SZ5 RT (Femur) IMPLANT
GAUZE SPONGE 4X4 12PLY STRL (GAUZE/BANDAGES/DRESSINGS) ×3 IMPLANT
GAUZE XEROFORM 1X8 LF (GAUZE/BANDAGES/DRESSINGS) IMPLANT
GLOVE BIO SURGEON STRL SZ7.5 (GLOVE) ×3 IMPLANT
GLOVE BIOGEL PI IND STRL 8 (GLOVE) ×2 IMPLANT
GLOVE BIOGEL PI INDICATOR 8 (GLOVE) ×4
GLOVE ECLIPSE 8.0 STRL XLNG CF (GLOVE) ×3 IMPLANT
GOWN STRL REUS W/TWL XL LVL3 (GOWN DISPOSABLE) ×6 IMPLANT
HANDPIECE INTERPULSE COAX TIP (DISPOSABLE) ×3
HOLDER FOLEY CATH W/STRAP (MISCELLANEOUS) ×2 IMPLANT
IMMOBILIZER KNEE 20 (SOFTGOODS) ×5 IMPLANT
IMMOBILIZER KNEE 20 THIGH 36 (SOFTGOODS) ×1 IMPLANT
KNEE PATELLA ASYMMETRIC 10X32 (Knees) ×2 IMPLANT
KNEE TIBIAL COMPONENT SZ5 (Knees) ×2 IMPLANT
NDL SAFETY ECLIPSE 18X1.5 (NEEDLE) IMPLANT
NEEDLE HYPO 18GX1.5 SHARP (NEEDLE)
NS IRRIG 1000ML POUR BTL (IV SOLUTION) ×3 IMPLANT
PACK TOTAL KNEE CUSTOM (KITS) ×3 IMPLANT
PADDING CAST COTTON 6X4 STRL (CAST SUPPLIES) ×4 IMPLANT
POSITIONER SURGICAL ARM (MISCELLANEOUS) ×3 IMPLANT
POSTERIOR FEMORAL SZ5 RT (Femur) ×3 IMPLANT
SET HNDPC FAN SPRY TIP SCT (DISPOSABLE) ×1 IMPLANT
SET PAD KNEE POSITIONER (MISCELLANEOUS) ×3 IMPLANT
STAPLER VISISTAT 35W (STAPLE) IMPLANT
STRIP CLOSURE SKIN 1/2X4 (GAUZE/BANDAGES/DRESSINGS) IMPLANT
SUT MNCRL AB 4-0 PS2 18 (SUTURE) ×2 IMPLANT
SUT VIC AB 0 CT1 27 (SUTURE) ×3
SUT VIC AB 0 CT1 27XBRD ANTBC (SUTURE) ×1 IMPLANT
SUT VIC AB 1 CT1 36 (SUTURE) ×6 IMPLANT
SUT VIC AB 2-0 CT1 27 (SUTURE) ×6
SUT VIC AB 2-0 CT1 TAPERPNT 27 (SUTURE) ×2 IMPLANT
SYR 3ML LL SCALE MARK (SYRINGE) IMPLANT
TRAY FOLEY MTR SLVR 16FR STAT (SET/KITS/TRAYS/PACK) ×3 IMPLANT
WATER STERILE IRR 1000ML POUR (IV SOLUTION) ×3 IMPLANT
WRAP KNEE MAXI GEL POST OP (GAUZE/BANDAGES/DRESSINGS) ×3 IMPLANT
YANKAUER SUCT BULB TIP 10FT TU (MISCELLANEOUS) ×3 IMPLANT

## 2017-11-30 NOTE — Op Note (Signed)
NAMEADVAITH, LAMARQUE MEDICAL RECORD [id number] ACCOUNT [id number] DATE OF BIRTH:62/14/1957 FACILITY: [facility] LOCATION: [facility] PHYSICIAN:Aisa Schoeppner Aretha Parrot, MD  OPERATIVE REPORT  DATE OF PROCEDURE:  11/30/2017  PREOPERATIVE DIAGNOSIS:  Primary osteoarthritis and degenerative joint disease, right knee.  POSTOPERATIVE DIAGNOSIS:  Primary osteoarthritis and degenerative joint disease, right knee.  PROCEDURE:  Right total knee arthroplasty.  IMPLANTS:  Stryker press-fit knee system with size 5 press-fit femur, size 5 press-fit tibial tray, size 32 press-fit patellar button, 9 mm fixed-bearing polyethylene insert.  SURGEON:  Vanita Panda. Magnus Ivan, MD  ASSISTANT:  Richardean Canal, PA-C  ANESTHESIA: 1.  Right lower extremity adductor canal block. 2.  Spinal. 3.  Local with a mixture of Exparel and saline.  ANTIBIOTICS:  Two grams IV Ancef.  TOURNIQUET TIME:  Less than 1 hour.  COMPLICATIONS:  None.  INDICATIONS:  The patient is a 62 year old gentleman well known to me.  He has debilitating arthritis in multiple joints.  He has had a successful left total knee arthroplasty in the past.  His right knee has been swollen for months now.  His pain has  gotten significantly worse, and this has detrimentally affected his activities of daily living, his range of motion, his mobility, and his quality of life.  At this point, he does wish to proceed with a knee replacement.  We have had him seen by his  cardiologist and cleared for this surgery.  He does have some cardiac issues.  He understands fully the risk of acute blood loss anemia, nerve or vessel injury, fracture, infection, DVT and implant failure.  He understands our goals are to decrease pain,  improve mobility, and overall improve quality of life.  DESCRIPTION OF PROCEDURE:  After informed consent was obtained and appropriate right knee was marked, an adductor canal block was obtained in the holding room.  He was brought to the  operating room and placed supine on the operating table.  The patient  was sat up.  A spinal anesthesia was then obtained.  He was then laid in supine position.  A Foley catheter was placed, and a nonsterile tourniquet was placed around his upper right thigh.  His right thigh, knee, leg, ankle, foot were prepped and draped  with DuraPrep and sterile drapes and a sterile stockinette.  Time-out was called, and he was identified as correct patient, correct right knee.  We then used an Esmarch to wrap that leg, and tourniquet was inflated to 300 mm of pressure.  I then made a  direct midline incision over the patella and carried this proximally and distally.  I dissected down the knee joint and carried out a medial parapatellar arthrotomy, finding a very large joint effusion and significant synovitis in his knee and arthritis  throughout.  He has significant cartilage loss throughout the knee.  He did have a preoperative flexion contracture as well.  Once we removed remnants of ACL, PCL, medial and lateral meniscus, we put the knee in a flexed position and used the  extramedullary cutting guide for making the alignment for our proximal tibia cut, correcting for varus and valgus and neutral slope.  We made our cut to take 9 mm off the high side.  We then went to the femur and used an intramedullary drill for setting  our distal femoral cut for right knee at 5 degrees externally rotated for a 10 mm distal femoral cut.  We made this cut without difficulty.  We then actually, after using our 9 mm extension  block, had some still slight flexion, so we took 2 more  millimeters off the distal femur and 2 more off the proximal tibia.  We then put our sizing guide for the femur off that based off the epicondylar axis and Whitesides line and chose a size 5 femur.  We put a 4-in-1 cutting block for a size 5 femur, made  our anterior and posterior cuts followed by our chamfer cuts and then our femoral box cut.  We then went  back to the tibia and chose a size 5 tibial tray for coverage.  We made our keel punch off of this after setting the rotation off the tibial tubercle  and the femur.  With the size 5 trial tibia followed by the size 5 trial femur, we tried a 9 mm polyethylene insert, and I was pleased with the range of motion and stability.  We then made our patellar cut and drilled 3 holes for a press-fit size 32  patellar button.  We then removed all instrumentation from the knee and irrigated the knee with normal saline solution using pulsatile lavage.  We then placed our mixture of Exparel and saline around the joint capsule.  We then dried the knee really  well, and with the knee in a flexed position, placed our real Stryker press-fit tibial tray from the [employer] system, size 5, followed by real size 5 press-fit femur.  We placed our 9 mm fixed-bearing polyethylene insert and press-fit our patellar  button.  We then let the tourniquet down.  Hemostasis was obtained with electrocautery.  We then closed the arthrotomy with #1 Vicryl suture followed by 0 Vicryl in the deep tissue, 2-0 Vicryl subcutaneous tissue, 4-0 Monocryl subcuticular stitch and  Steri-Strips on the skin.  A well-padded sterile dressing was applied.  He was taken to recovery room in stable condition.  All final counts were correct.  There were no complications noted.  Of note, Rexene Edison, PA-C, assisted the entire case.   Assistance was crucial for facilitating all aspects of this case.  LN/NUANCE  D:11/30/2017 T:11/30/2017 JOB:[id number]

## 2017-11-30 NOTE — H&P (Signed)
TOTAL KNEE ADMISSION H&P  Patient is being admitted for right total knee arthroplasty.  Subjective:  Chief Complaint:right knee pain.  HPI: Terry [name], 62 y.o. male, has a history of pain and functional disability in the right knee due to arthritis and has failed non-surgical conservative treatments for greater than 12 weeks to includeNSAID's and/or analgesics, corticosteriod injections, viscosupplementation injections, flexibility and strengthening excercises, use of assistive devices and activity modification.  Onset of symptoms was gradual, starting 3 years ago with rapidlly worsening course since that time. The patient noted no past surgery on the right knee(s).  Patient currently rates pain in the right knee(s) at 10 out of 10 with activity. Patient has night pain, worsening of pain with activity and weight bearing, pain that interferes with activities of daily living, pain with passive range of motion, crepitus and joint swelling.  Patient has evidence of subchondral cysts, subchondral sclerosis, periarticular osteophytes and joint space narrowing by imaging studies. There is no active infection.  Patient Active Problem List   Diagnosis Date Noted  . Unilateral primary osteoarthritis, right knee 10/17/2017  . S/P ICD (internal cardiac defibrillator) procedure 08/05/2014  . Nonischemic dilated cardiomyopathy (HCC)   . Hypertension   . Aortic insufficiency   . Persistent atrial fibrillation   . Chronic systolic heart failure (HCC)   . Aortic root dilatation (HCC)   . Urinary retention 12/01/2012  . Degenerative arthritis of left knee 11/29/2012  . Degenerative arthritis of hip 09/08/2011   Past Medical History:  Diagnosis Date  . AICD (automatic cardioverter/defibrillator) present   . Anemia    "S/P knee & hip OR"  . Aortic insufficiency    mild   . Aortic root dilatation (HCC)   . Atrial fibrillation (HCC)    "off and on since 2013" (08/05/2014)  . Chronic systolic CHF  (congestive heart failure), NYHA class 1 (HCC)   . Complication of anesthesia    hx of irregular beat after anesthesia in ~ 1990's  . Dysrhythmia   . Ejection fraction < 50%    by echo 08/25/11   . GERD (gastroesophageal reflux disease)   . H/O hematuria   . H/O hiatal hernia   . 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 History of blood transfusion 2013; 2014   S/P knee and hip OR  . Hypertension   . 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 Rheumatoid arthritis (HCC) dx'd 1995    Past Surgical History:  Procedure Laterality Date  . CARDIAC CATHETERIZATION  ~ 2004   normal  . CARDIAC CATHETERIZATION N/A 09/07/2014   Procedure: Left Heart Cath and Coronary Angiography;  Surgeon: Laurey Morale, MD;  Location: [facility] INVASIVE CV LAB;  Service: Cardiovascular;  Laterality: N/A;  . CARDIAC DEFIBRILLATOR PLACEMENT  08/05/2014   St. Jude  . CARDIOVERSION N/A 08/21/2013   Procedure: CARDIOVERSION;  Surgeon: Quintella Reichert, MD;  Location: [facility] ENDOSCOPY;  Service: Cardiovascular;  Laterality: N/A;  . COLONOSCOPY  2010; 2015   "polyps; no polyps"  . EP IMPLANTABLE DEVICE N/A 08/05/2014   Procedure: ICD Implant;  Surgeon: [name], MD;  Location: [facility] INVASIVE CV LAB;  Service: Cardiovascular;  Laterality: N/A;  . 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 JOINT REPLACEMENT    . NASAL SEPTUM SURGERY  ~ 1975  . TONSILLECTOMY    . TOTAL HIP ARTHROPLASTY  09/08/2011   Procedure: TOTAL HIP ARTHROPLASTY  ANTERIOR APPROACH;  Surgeon: [name], MD;  Location: [facility] ORS;  Service: Orthopedics;  Laterality: Right;  Right total hip replacement, left knee steroid injection  . TOTAL KNEE ARTHROPLASTY Left 11/29/2012   Procedure: LEFT TOTAL KNEE ARTHROPLASTY;  Surgeon: [name], MD;  Location: [facility] ORS;  Service: Orthopedics;  Laterality: Left;  FEMORAL NERVE BLOCK IN HOLDING AREA LEFT LEG  . URETHRAL FISTULA REPAIR  ~ 1996  . URETHRAL STRICTURE  DILATATION  "several times"    Current Facility-Administered Medications  Medication Dose Route Frequency Provider Last Rate Last Dose  . ceFAZolin (ANCEF) IVPB 2g/100 mL premix  2 g Intravenous On Call to OR Kirtland Bouchard, PA-C      . chlorhexidine (HIBICLENS) 4 % liquid 4 application  60 mL Topical Once Richardean Canal W, PA-C      . chlorhexidine (HIBICLENS) 4 % liquid 4 application  60 mL Topical Once Richardean Canal W, PA-C      . lactated ringers infusion   Intravenous Continuous Leonides Grills, MD 50 mL/hr at 11/30/17 0605    . tranexamic acid (CYKLOKAPRON) IVPB 1,000 mg  1,000 mg Intravenous To OR Kirtland Bouchard, PA-C       Allergies  Allergen Reactions  . Spironolactone     Gynecomastia  . Sulfa Antibiotics     Thrush and hives    Social History   Tobacco Use  . Smoking status: Never Smoker  . Smokeless tobacco: Never Used  Substance Use Topics  . Alcohol use: Yes    Alcohol/week: 1.0 standard drinks    Types: 1 Cans of beer per week    Family History  Problem Relation Age of Onset  . Heart disease Father   . Heart attack Father 53  . Heart failure Brother      Review of Systems  Musculoskeletal: Positive for joint pain.  All other systems reviewed and are negative.   Objective:  Physical Exam  Constitutional: He is oriented to person, place, and time. He appears well-developed and well-nourished.  HENT:  Head: Normocephalic and atraumatic.  Eyes: Pupils are equal, round, and reactive to light. EOM are normal.  Neck: Normal range of motion. Neck supple.  Cardiovascular: Normal rate and regular rhythm.  Respiratory: Effort normal.  GI: Soft. Bowel sounds are normal.  Musculoskeletal:       Right knee: He exhibits decreased range of motion, swelling, effusion and abnormal alignment. Tenderness found. Medial joint line and lateral joint line tenderness noted.  Neurological: He is alert and oriented to person, place, and time.  Skin: Skin is warm and  dry.  Psychiatric: He has a normal mood and affect.    Vital signs in last 24 hours: Temp:  [98.7 F (37.1 C)] 98.7 F (37.1 C) (10/18 0550) Pulse Rate:  [66] 66 (10/18 0550) Resp:  [18] 18 (10/18 0550) BP: (89)/(62) 89/62 (10/18 0550) SpO2:  [97 %] 97 % (10/18 0550) Weight:  [89.4 kg] 89.4 kg (10/18 0550)  Labs:   Estimated body mass index is 27.48 kg/m as calculated from the following:   Height as of this encounter: 5\' 11"  (1.803 m).   Weight as of this encounter: 89.4 kg.   Imaging Review Plain radiographs demonstrate severe degenerative joint disease of the right knee(s). The overall alignment ismild varus. The bone quality appears to be good for age and reported activity level.   Preoperative templating of the joint replacement has been completed, documented, and submitted to the Operating  Room personnel in order to optimize intra-operative equipment management.   Anticipated LOS equal to or greater than 2 midnights due to - Age 34 and older with one or more of the following:  - Obesity  - Expected need for hospital services (PT, OT, Nursing) required for safe  discharge  - Anticipated need for postoperative skilled nursing care or inpatient rehab  - Active co-morbidities: Coronary Artery Disease OR   - Unanticipated findings during/Post Surgery: None  - Patient is a high risk of re-admission due to: None     Assessment/Plan:  End stage arthritis, right knee   The patient history, physical examination, clinical judgment of the provider and imaging studies are consistent with end stage degenerative joint disease of the right knee(s) and total knee arthroplasty is deemed medically necessary. The treatment options including medical management, injection therapy arthroscopy and arthroplasty were discussed at length. The risks and benefits of total knee arthroplasty were presented and reviewed. The risks due to aseptic loosening, infection, stiffness, patella tracking  problems, thromboembolic complications and other imponderables were discussed. The patient acknowledged the explanation, agreed to proceed with the plan and consent was signed. Patient is being admitted for inpatient treatment for surgery, pain control, PT, OT, prophylactic antibiotics, VTE prophylaxis, progressive ambulation and ADL's and discharge planning. The patient is planning to be discharged home with home health services

## 2017-11-30 NOTE — Brief Op Note (Signed)
11/30/2017  8:36 AM  PATIENT:  Terry [name]  62 y.o. male  PRE-OPERATIVE DIAGNOSIS:  osteoarthritis right knee  POST-OPERATIVE DIAGNOSIS:  osteoarthritis right knee  PROCEDURE:  Procedure(s): RIGHT TOTAL KNEE ARTHROPLASTY (Right)  SURGEON:  Surgeon(s) and Role:    [name], MD - Primary  PHYSICIAN ASSISTANT: Rexene Edison, PA-C  ANESTHESIA:   local, regional and spinal  EBL:  50 mL   COUNTS:  YES  TOURNIQUET:   Total Tourniquet Time Documented: Thigh (Right) - 45 minutes Total: Thigh (Right) - 45 minutes   DICTATION: .Other Dictation: Dictation Number [id number]  PLAN OF CARE: Admit to inpatient   PATIENT DISPOSITION:  PACU - hemodynamically stable.   Delay start of Pharmacological VTE agent (>24hrs) due to surgical blood loss or risk of bleeding: no

## 2017-11-30 NOTE — Anesthesia Procedure Notes (Signed)
Spinal  Patient location during procedure: OR Start time: 11/30/2017 7:15 AM End time: 11/30/2017 7:20 AM Staffing Anesthesiologist: Leonides Grills, MD Performed: anesthesiologist  Preanesthetic Checklist Completed: patient identified, surgical consent, pre-op evaluation, timeout performed, IV checked, risks and benefits discussed and monitors and equipment checked Spinal Block Patient position: sitting Prep: DuraPrep Patient monitoring: cardiac monitor, continuous pulse ox and blood pressure Approach: midline Location: L3-4 Injection technique: single-shot Needle Needle type: Pencan  Needle gauge: 24 G Needle length: 9 cm Assessment Sensory level: T10 Additional Notes Functioning IV was confirmed and monitors were applied. Sterile prep and drape, including hand hygiene and sterile gloves were used. The patient was positioned and the spine was prepped. The skin was anesthetized with lidocaine.  Free flow of clear CSF was obtained prior to injecting local anesthetic into the CSF.  The spinal needle aspirated freely following injection.  The needle was carefully withdrawn.  The patient tolerated the procedure well.

## 2017-11-30 NOTE — Anesthesia Procedure Notes (Signed)
Date/Time: 11/30/2017 7:19 AM Performed by: Jhonnie Garner, CRNA Oxygen Delivery Method: Simple face mask

## 2017-11-30 NOTE — Evaluation (Signed)
Physical Therapy Evaluation Patient Details Name: Terry [name] MRN: [id number] DOB: November 03, 1955 Today's Date: 11/30/2017   History of Present Illness  Pt s/p R TKR and with hx of L TKR, R THR, a-fib and RA.    Clinical Impression  Pt s/p R TKR and presents with decreased R LE strength/ROM and post op pain limiting functional mobility.  Pt should progress to dc home with family assist.    Follow Up Recommendations Home health PT;Follow surgeon's recommendation for DC plan and follow-up therapies    Equipment Recommendations  3in1 (PT)    Recommendations for Other Services OT consult     Precautions / Restrictions Precautions Precautions: Fall Required Braces or Orthoses: Knee Immobilizer - Right Knee Immobilizer - Right: Discontinue once straight leg raise with < 10 degree lag Restrictions Weight Bearing Restrictions: No RLE Weight Bearing: Weight bearing as tolerated      Mobility  Bed Mobility Overal bed mobility: Needs Assistance Bed Mobility: Supine to Sit     Supine to sit: Min assist;Mod assist     General bed mobility comments: cues for sequence and use of L LE to self assist  Transfers Overall transfer level: Needs assistance Equipment used: Rolling walker (2 wheeled) Transfers: Sit to/from Stand Sit to Stand: Min assist;Mod assist;From elevated surface         General transfer comment: cues for LE management and use of UEs to self assist  Ambulation/Gait Ambulation/Gait assistance: Min assist Gait Distance (Feet): 34 Feet Assistive device: Rolling walker (2 wheeled) Gait Pattern/deviations: Step-to pattern;Decreased step length - right;Decreased step length - left;Shuffle;Trunk flexed Gait velocity: decr   General Gait Details: cues for sequence, posture and position from [employer]            Wheelchair Mobility    Modified Rankin (Stroke Patients Only)       Balance Overall balance assessment: Needs assistance Sitting-balance  support: No upper extremity supported;Feet supported Sitting balance-Leahy Scale: Good     Standing balance support: Bilateral upper extremity supported Standing balance-Leahy Scale: Poor                               Pertinent Vitals/Pain Pain Assessment: 0-10 Pain Score: 5  Pain Location: R knee Pain Descriptors / Indicators: Aching;Sore Pain Intervention(s): Limited activity within patient's tolerance;Monitored during session;Premedicated before session;Ice applied    Home Living Family/patient expects to be discharged to:: Private residence Living Arrangements: Spouse/significant other Available Help at Discharge: Family Type of Home: House Home Access: Stairs to enter Entrance Stairs-Rails: Doctor, general practice of Steps: 5 Home Layout: Able to live on main level with bedroom/bathroom Home Equipment: Walker - 2 wheels      Prior Function Level of Independence: Independent               Hand Dominance   Dominant Hand: Right    Extremity/Trunk Assessment   Upper Extremity Assessment Upper Extremity Assessment: RUE deficits/detail;LUE deficits/detail;Defer to OT evaluation    Lower Extremity Assessment Lower Extremity Assessment: RLE deficits/detail       Communication   Communication: No difficulties  Cognition Arousal/Alertness: Awake/alert Behavior During Therapy: WFL for tasks assessed/performed Overall Cognitive Status: Within Functional Limits for tasks assessed  General Comments      Exercises     Assessment/Plan    PT Assessment Patient needs continued PT services  PT Problem List Decreased strength;Decreased range of motion;Decreased activity tolerance;Decreased balance;Decreased mobility;Decreased knowledge of use of DME;Pain       PT Treatment Interventions DME instruction;Gait training;Stair training;Functional mobility training;Therapeutic  activities;Therapeutic exercise;Balance training;Patient/family education    PT Goals (Current goals can be found in the Care Plan section)  Acute Rehab PT Goals Patient Stated Goal: Walk with less pain PT Goal Formulation: With patient Time For Goal Achievement: 12/07/17 Potential to Achieve Goals: Good    Frequency 7X/week   Barriers to discharge        Co-evaluation               AM-PAC PT "6 Clicks" Daily Activity  Outcome Measure Difficulty turning over in bed (including adjusting bedclothes, sheets and blankets)?: Unable Difficulty moving from lying on back to sitting on the side of the bed? : Unable Difficulty sitting down on and standing up from a chair with arms (e.g., wheelchair, bedside commode, etc,.)?: Unable Help needed moving to and from a bed to chair (including a wheelchair)?: A Lot Help needed walking in hospital room?: A Little Help needed climbing 3-5 steps with a railing? : A Lot 6 Click Score: 10    End of Session Equipment Utilized During Treatment: Gait belt;Right knee immobilizer Activity Tolerance: Patient tolerated treatment well Patient left: in chair;with call bell/phone within reach;with family/visitor present Nurse Communication: Mobility status PT Visit Diagnosis: Difficulty in walking, not elsewhere classified (R26.2)    Time: 5170-0174 PT Time Calculation (min) (ACUTE ONLY): 26 min   Charges:   PT Evaluation $PT Eval Low Complexity: 1 Low PT Treatments $Gait Training: 8-22 mins        Mauro Kaufmann PT Acute Rehabilitation Services Pager [phone] Office [phone]   Haylea Schlichting 11/30/2017, 5:32 PM

## 2017-11-30 NOTE — Transfer of Care (Signed)
Immediate Anesthesia Transfer of Care Note  Patient: Terry [name]  Procedure(s) Performed: RIGHT TOTAL KNEE ARTHROPLASTY (Right Knee)  Patient Location: PACU  Anesthesia Type:Spinal  Level of Consciousness: awake, alert  and oriented  Airway & Oxygen Therapy: Patient Spontanous Breathing and Patient connected to face mask oxygen  Post-op Assessment: Report given to RN and Post -op Vital signs reviewed and stable  Post vital signs: Reviewed and stable  Last Vitals:  Vitals Value Taken Time  BP    Temp    Pulse    Resp    SpO2      Last Pain:  Vitals:   11/30/17 0550  TempSrc: Oral      Patients Stated Pain Goal: 4 (11/30/17 0550)  Complications: No apparent anesthesia complications

## 2017-11-30 NOTE — Anesthesia Procedure Notes (Signed)
Anesthesia Regional Block: Adductor canal block   Pre-Anesthetic Checklist: ,, timeout performed, Correct Patient, Correct Site, Correct Laterality, Correct Procedure,, site marked, risks and benefits discussed, Surgical consent,  Pre-op evaluation,  At surgeon's request and post-op pain management  Laterality: Right  Prep: chloraprep       Needles:  Injection technique: Single-shot  Needle Type: Echogenic Stimulator Needle     Needle Length: 9cm  Needle Gauge: 21     Additional Needles:   Procedures:,,,, ultrasound used (permanent image in chart),,,,  Narrative:  Start time: 11/30/2017 6:50 AM End time: 11/30/2017 7:00 AM Injection made incrementally with aspirations every 5 mL.  Performed by: Personally  Anesthesiologist: Leonides Grills, MD  Additional Notes: Functioning IV was confirmed and monitors were applied. A time-out was performed. Hand hygiene and sterile gloves were used. The thigh was placed in a frog-leg position and prepped in a sterile fashion. A 63mm 21ga Arrow echogenic stimulator needle was placed using ultrasound guidance.  Negative aspiration and negative test dose prior to incremental administration of local anesthetic. The patient tolerated the procedure well.

## 2017-11-30 NOTE — Anesthesia Postprocedure Evaluation (Signed)
Anesthesia Post Note  Patient: Terry [name]  Procedure(s) Performed: RIGHT TOTAL KNEE ARTHROPLASTY (Right Knee)     Patient location during evaluation: PACU Anesthesia Type: Regional and Spinal Level of consciousness: oriented and awake and alert Pain management: pain level controlled Vital Signs Assessment: post-procedure vital signs reviewed and stable Respiratory status: spontaneous breathing, respiratory function stable and patient connected to nasal cannula oxygen Cardiovascular status: blood pressure returned to baseline and stable Postop Assessment: no headache, no backache, no apparent nausea or vomiting and spinal receding Anesthetic complications: no    Last Vitals:  Vitals:   11/30/17 1619 11/30/17 1809  BP: 127/74 117/63  Pulse: (!) 58 80  Resp:    Temp:  36.6 C  SpO2:  99%    Last Pain:  Vitals:   11/30/17 1809  TempSrc: Axillary  PainSc:                  Catheryn Bacon Ellender

## 2017-12-01 DIAGNOSIS — Z96651 Presence of right artificial knee joint: Secondary | ICD-10-CM

## 2017-12-01 DIAGNOSIS — R001 Bradycardia, unspecified: Secondary | ICD-10-CM | POA: Diagnosis present

## 2017-12-01 LAB — BASIC METABOLIC PANEL
ANION GAP: 8 (ref 5–15)
BUN: 16 mg/dL (ref 8–23)
CALCIUM: 8.5 mg/dL — AB (ref 8.9–10.3)
CO2: 23 mmol/L (ref 22–32)
Chloride: 101 mmol/L (ref 98–111)
Creatinine, Ser: 1.07 mg/dL (ref 0.61–1.24)
Glucose, Bld: 187 mg/dL — ABNORMAL HIGH (ref 70–99)
POTASSIUM: 4.6 mmol/L (ref 3.5–5.1)
Sodium: 132 mmol/L — ABNORMAL LOW (ref 135–145)

## 2017-12-01 LAB — COMPREHENSIVE METABOLIC PANEL
ALK PHOS: 62 U/L (ref 38–126)
ALT: 7 U/L (ref 0–44)
AST: 14 U/L — AB (ref 15–41)
Albumin: 3.3 g/dL — ABNORMAL LOW (ref 3.5–5.0)
Anion gap: 8 (ref 5–15)
BILIRUBIN TOTAL: 0.6 mg/dL (ref 0.3–1.2)
BUN: 18 mg/dL (ref 8–23)
CALCIUM: 8.5 mg/dL — AB (ref 8.9–10.3)
CO2: 26 mmol/L (ref 22–32)
CREATININE: 1.09 mg/dL (ref 0.61–1.24)
Chloride: 96 mmol/L — ABNORMAL LOW (ref 98–111)
GFR calc Af Amer: >60 mL/min (ref >60)
GFR calc non Af Amer: >60 mL/min (ref >60)
GLUCOSE: 191 mg/dL — AB (ref 70–99)
Potassium: 4 mmol/L (ref 3.5–5.1)
SODIUM: 130 mmol/L — AB (ref 135–145)
TOTAL PROTEIN: 6.7 g/dL (ref 6.5–8.1)

## 2017-12-01 LAB — CBC
HEMATOCRIT: 31.7 % — AB (ref 39.0–52.0)
Hemoglobin: 10.3 g/dL — ABNORMAL LOW (ref 13.0–17.0)
MCH: 28.1 pg (ref 26.0–34.0)
MCHC: 32.5 g/dL (ref 30.0–36.0)
MCV: 86.6 fL (ref 80.0–100.0)
NRBC: 0 % (ref 0.0–0.2)
PLATELETS: 230 10*3/uL (ref 150–400)
RBC: 3.66 MIL/uL — AB (ref 4.22–5.81)
RDW: 12.4 % (ref 11.5–15.5)
WBC: 17.9 10*3/uL — AB (ref 4.0–10.5)

## 2017-12-01 LAB — MAGNESIUM: Magnesium: 1.6 mg/dL — ABNORMAL LOW (ref 1.7–2.4)

## 2017-12-01 LAB — TSH: TSH: 1.195 u[IU]/mL (ref 0.350–4.500)

## 2017-12-01 MED ORDER — OXYCODONE HCL 5 MG PO TABS: 5.0000 mg | ORAL_TABLET | ORAL | 0 refills | Status: DC | PRN

## 2017-12-01 MED ORDER — METHOCARBAMOL 500 MG PO TABS: 500.0000 mg | ORAL_TABLET | Freq: Four times a day (QID) | ORAL | 0 refills | Status: DC | PRN

## 2017-12-01 NOTE — Discharge Instructions (Signed)

## 2017-12-01 NOTE — Progress Notes (Signed)
Physical Therapy Treatment Patient Details Name: Terry [name] MRN: [id number] DOB: 31-May-1955 Today's Date: 12/01/2017    History of Present Illness Pt s/p R TKR and with hx of L TKR, R THR, a-fib and RA.      PT Comments    Pt continues very motivated and ambulating increased distance in hall - initially antalgic step-to gait but progressing to reciprocal gait with increased distance.  Follow Up Recommendations  Home health PT;Follow surgeon's recommendation for DC plan and follow-up therapies     Equipment Recommendations  3in1 (PT)    Recommendations for Other Services OT consult     Precautions / Restrictions Precautions Precautions: Fall Precaution Comments: monitor HR Required Braces or Orthoses: Knee Immobilizer - Right Knee Immobilizer - Right: Discontinue once straight leg raise with < 10 degree lag Restrictions Weight Bearing Restrictions: No RLE Weight Bearing: Weight bearing as tolerated    Mobility  Bed Mobility Overal bed mobility: Needs Assistance Bed Mobility: Sit to Supine     Supine to sit: Min assist Sit to supine: Min assist   General bed mobility comments: cues for sequence and min assist to manage R LE  Transfers Overall transfer level: Needs assistance Equipment used: Rolling walker (2 wheeled) Transfers: Sit to/from Stand Sit to Stand: Min assist         General transfer comment: verbal cues for hand placement and LE management.  Min assist to bring wt up and fwd.  Pt ltd by UE arthritic changes  Ambulation/Gait Ambulation/Gait assistance: Min assist;Min guard Gait Distance (Feet): 210 Feet Assistive device: Rolling walker (2 wheeled) Gait Pattern/deviations: Decreased step length - right;Decreased step length - left;Shuffle;Trunk flexed;Step-to pattern;Step-through pattern;Antalgic Gait velocity: decr   General Gait Details: cues for sequence, posture and position from [employer]             Wheelchair Mobility     Modified Rankin (Stroke Patients Only)       Balance Overall balance assessment: Needs assistance Sitting-balance support: No upper extremity supported;Feet supported Sitting balance-Leahy Scale: Good     Standing balance support: Bilateral upper extremity supported Standing balance-Leahy Scale: Poor                              Cognition Arousal/Alertness: Awake/alert Behavior During Therapy: WFL for tasks assessed/performed Overall Cognitive Status: Within Functional Limits for tasks assessed                                        Exercises Total Joint Exercises Ankle Circles/Pumps: AROM;Both;15 reps;Supine Quad Sets: AROM;Both;10 reps;Supine Heel Slides: AAROM;Right;15 reps;Supine Straight Leg Raises: AAROM;Right;15 reps;Supine Goniometric ROM: AAROM R knee -12 - 50 - pain limited with muscle guarding    General Comments        Pertinent Vitals/Pain Pain Assessment: 0-10 Pain Score: 5  Pain Location: R knee Pain Descriptors / Indicators: Aching;Sore Pain Intervention(s): Limited activity within patient's tolerance;Monitored during session;Premedicated before session;Ice applied    Home Living                      Prior Function            PT Goals (current goals can now be found in the care plan section) Acute Rehab PT Goals Patient Stated Goal: become more independent. PT Goal Formulation: With patient  Time For Goal Achievement: 12/07/17 Potential to Achieve Goals: Good Progress towards PT goals: Progressing toward goals    Frequency    7X/week      PT Plan Current plan remains appropriate    Co-evaluation              AM-PAC PT "6 Clicks" Daily Activity  Outcome Measure  Difficulty turning over in bed (including adjusting bedclothes, sheets and blankets)?: Unable Difficulty moving from lying on back to sitting on the side of the bed? : Unable Difficulty sitting down on and standing up from a  chair with arms (e.g., wheelchair, bedside commode, etc,.)?: Unable Help needed moving to and from a bed to chair (including a wheelchair)?: A Little Help needed walking in hospital room?: A Little Help needed climbing 3-5 steps with a railing? : A Lot 6 Click Score: 11    End of Session Equipment Utilized During Treatment: Gait belt;Right knee immobilizer Activity Tolerance: Patient tolerated treatment well Patient left: in bed;with call bell/phone within reach;with family/visitor present Nurse Communication: Mobility status PT Visit Diagnosis: Difficulty in walking, not elsewhere classified (R26.2)     Time: 9675-9163 PT Time Calculation (min) (ACUTE ONLY): 25 min  Charges:  $Gait Training: 23-37 mins $Therapeutic Exercise: 8-22 mins                     Mauro Kaufmann PT Acute Rehabilitation Services Pager [phone] Office [phone]    Marquee Fuchs 12/01/2017, 2:27 PM

## 2017-12-01 NOTE — Plan of Care (Signed)
Pt alert and oriented, OOB to chair with Ot.  Plan of care discussed with pt and family at the bedside. RN will monitor.

## 2017-12-01 NOTE — Progress Notes (Signed)
Pt refuses spironolactone (substituted for Inspra) due to allergy & refuses to have wife bring med from home. Method of handling home meds explained to pt & suggested he have wife bring med in AM. He stated a belief that to do so would lead to trouble & would not do so. Veronika Heard, [employer]

## 2017-12-01 NOTE — Progress Notes (Signed)
Physical Therapy Treatment Patient Details Name: Terry [name] MRN: [id number] DOB: 1955-10-02 Today's Date: 12/01/2017    History of Present Illness Pt s/p R TKR and with hx of L TKR, R THR, a-fib and RA.      PT Comments    Initiated therex program.  Ambulation deferred to later am 2* to pt fatigue following OT session.   Follow Up Recommendations  Home health PT;Follow surgeon's recommendation for DC plan and follow-up therapies     Equipment Recommendations  3in1 (PT)    Recommendations for Other Services OT consult     Precautions / Restrictions Precautions Precautions: Fall Precaution Comments: monitor HR Required Braces or Orthoses: Knee Immobilizer - Right Knee Immobilizer - Right: Discontinue once straight leg raise with < 10 degree lag Restrictions Weight Bearing Restrictions: No RLE Weight Bearing: Weight bearing as tolerated    Mobility  Bed Mobility Overal bed mobility: Needs Assistance Bed Mobility: Supine to Sit     Supine to sit: Min assist     General bed mobility comments: Up in chair with OT  Transfers Overall transfer level: Needs assistance Equipment used: Rolling walker (2 wheeled) Transfers: Sit to/from Stand Sit to Stand: Min assist         General transfer comment: verbal cues for hand placement and LE management.  Ambulation/Gait                 Stairs             Wheelchair Mobility    Modified Rankin (Stroke Patients Only)       Balance                                            Cognition Arousal/Alertness: Awake/alert Behavior During Therapy: WFL for tasks assessed/performed Overall Cognitive Status: Within Functional Limits for tasks assessed                                        Exercises Total Joint Exercises Ankle Circles/Pumps: AROM;Both;15 reps;Supine Quad Sets: AROM;Both;10 reps;Supine Heel Slides: AAROM;Right;15 reps;Supine Straight Leg Raises:  AAROM;Right;15 reps;Supine Goniometric ROM: AAROM R knee -12 - 50 - pain limited with muscle guarding    General Comments        Pertinent Vitals/Pain Pain Assessment: 0-10 Pain Score: 5  Pain Location: R knee Pain Descriptors / Indicators: Aching;Sore Pain Intervention(s): Limited activity within patient's tolerance;Monitored during session;Premedicated before session;Ice applied    Home Living Family/patient expects to be discharged to:: Private residence Living Arrangements: Spouse/significant other Available Help at Discharge: Family Type of Home: House Home Access: Stairs to enter Entrance Stairs-Rails: Right;Left Home Layout: Able to live on main level with bedroom/bathroom Home Equipment: Walker - 2 wheels;Shower seat      Prior Function Level of Independence: Independent          PT Goals (current goals can now be found in the care plan section) Acute Rehab PT Goals Patient Stated Goal: become more independent. PT Goal Formulation: With patient Time For Goal Achievement: 12/07/17 Potential to Achieve Goals: Good Progress towards PT goals: Progressing toward goals    Frequency    7X/week      PT Plan Current plan remains appropriate    Co-evaluation  AM-PAC PT "6 Clicks" Daily Activity  Outcome Measure  Difficulty turning over in bed (including adjusting bedclothes, sheets and blankets)?: Unable Difficulty moving from lying on back to sitting on the side of the bed? : Unable Difficulty sitting down on and standing up from a chair with arms (e.g., wheelchair, bedside commode, etc,.)?: Unable Help needed moving to and from a bed to chair (including a wheelchair)?: A Lot Help needed walking in hospital room?: A Little Help needed climbing 3-5 steps with a railing? : A Lot 6 Click Score: 10    End of Session   Activity Tolerance: Patient tolerated treatment well;Patient limited by pain Patient left: in chair;with call bell/phone  within reach;with family/visitor present Nurse Communication: Mobility status PT Visit Diagnosis: Difficulty in walking, not elsewhere classified (R26.2)     Time: 3716-9678 PT Time Calculation (min) (ACUTE ONLY): 22 min  Charges:  $Therapeutic Exercise: 8-22 mins                     Mauro Kaufmann PT Acute Rehabilitation Services Pager [phone] Office [phone]    Vergil Burby 12/01/2017, 2:16 PM

## 2017-12-01 NOTE — Evaluation (Addendum)
Occupational Therapy Evaluation Patient Details Name: Terry [name] MRN: [id number] DOB: March 25, 1955 Today's Date: 12/01/2017    History of Present Illness Pt s/p R TKR and with hx of L TKR, R THR, a-fib and RA.     Clinical Impression   Pt tolerated up to the 3in1 with walker but upon sitting down in recliner, became a little lightheaded. Vitals taken and O2 99% and HR 33. Informed nursing. Pt stated he started to feel better after several minutes of rest in recliner. Pt will benefit from continued OT to progress ADL independence for return home with family.    Follow Up Recommendations  No OT follow up    Equipment Recommendations  3 in 1 bedside commode    Recommendations for Other Services       Precautions / Restrictions Precautions Precautions: Fall Precaution Comments: monitor HR Required Braces or Orthoses: Knee Immobilizer - Right Knee Immobilizer - Right: Discontinue once straight leg raise with < 10 degree lag Restrictions Weight Bearing Restrictions: No RLE Weight Bearing: Weight bearing as tolerated      Mobility Bed Mobility Overal bed mobility: Needs Assistance Bed Mobility: Supine to Sit     Supine to sit: Min assist     General bed mobility comments: min assist support for R LE over to EOB. verbal cues for technique.  Transfers Overall transfer level: Needs assistance Equipment used: Rolling walker (2 wheeled) Transfers: Sit to/from Stand Sit to Stand: Min assist         General transfer comment: verbal cues for hand placement and LE management.    Balance                                           ADL either performed or assessed with clinical judgement   ADL Overall ADL's : Needs assistance/impaired Eating/Feeding: Independent;Sitting   Grooming: Wash/dry hands;Set up;Sitting   Upper Body Bathing: Set up;Sitting   Lower Body Bathing: Moderate assistance;Sit to/from stand   Upper Body Dressing : Set up;Sitting    Lower Body Dressing: Moderate assistance;Sit to/from stand   Toilet Transfer: Minimal assistance;Ambulation;BSC;RW   Toileting- Clothing Manipulation and Hygiene: Minimal assistance;Sit to/from stand         General ADL Comments: Pt needed mod cues for safety in tight spaces in the bathroom to safely manuever walker especially with turns. He also needs cues for correct walker sequence and hand placement. At times he steps to close to the walker also. Pt complained of some lightheadedness with stand to sit in the recliner. Noted HR on room monitor was 34 and placed pulse ox from [employer] and HR showing 33. Informed nursing of HR. Educated on AE options but pt and wife state she will assist with LB self care PRN. Discussed using 3in1 in shower but pt states his shower is too small for even 3in1 and he will sponge bathe and progress to standing in shower.      Vision Patient Visual Report: No change from baseline       Perception     Praxis      Pertinent Vitals/Pain Pain Assessment: 0-10 Pain Score: 5  Pain Location: R knee Pain Descriptors / Indicators: Aching Pain Intervention(s): Monitored during session     Hand Dominance Right   Extremity/Trunk Assessment Upper Extremity Assessment Upper Extremity Assessment: Overall WFL for tasks assessed  Communication Communication Communication: No difficulties   Cognition Arousal/Alertness: Awake/alert Behavior During Therapy: WFL for tasks assessed/performed Overall Cognitive Status: Within Functional Limits for tasks assessed                                     General Comments       Exercises     Shoulder Instructions      Home Living Family/patient expects to be discharged to:: Private residence Living Arrangements: Spouse/significant other Available Help at Discharge: Family Type of Home: House Home Access: Stairs to enter Secretary/administrator of Steps: 5 Entrance Stairs-Rails:  Right;Left Home Layout: Able to live on main level with bedroom/bathroom     Bathroom Shower/Tub: Producer, television/film/video: Standard     Home Equipment: Environmental consultant - 2 wheels;Shower seat          Prior Functioning/Environment Level of Independence: Independent                 OT Problem List: Decreased strength;Decreased knowledge of use of DME or AE      OT Treatment/Interventions: Self-care/ADL training;DME and/or AE instruction;Therapeutic activities;Patient/family education    OT Goals(Current goals can be found in the care plan section) Acute Rehab OT Goals Patient Stated Goal: become more independent. OT Goal Formulation: With patient Time For Goal Achievement: 12/08/17 Potential to Achieve Goals: Good  OT Frequency: Min 2X/week   Barriers to D/C:            Co-evaluation              AM-PAC PT "6 Clicks" Daily Activity     Outcome Measure Help from another person eating meals?: None Help from another person taking care of personal grooming?: A Little Help from another person toileting, which includes using toliet, bedpan, or urinal?: A Little Help from another person bathing (including washing, rinsing, drying)?: A Lot Help from another person to put on and taking off regular upper body clothing?: None Help from another person to put on and taking off regular lower body clothing?: A Lot 6 Click Score: 18   End of Session Equipment Utilized During Treatment: Gait belt;Right knee immobilizer  Activity Tolerance: Other (comment)(some lightheadedness at end of session) Patient left: in chair;with call bell/phone within reach  OT Visit Diagnosis: Unsteadiness on feet (R26.81);Muscle weakness (generalized) (M62.81)                Time: 1287-8676 OT Time Calculation (min): 44 min Charges:  OT General Charges $OT Visit: 1 Visit OT Evaluation $OT Eval Low Complexity: 1 Low OT Treatments $Self Care/Home Management : 8-22 mins $Therapeutic  Activity: 8-22 mins    Zannie Kehr Jiah Bari OTR/L Acute Rehab [phone] 12/01/2017, 1:02 PM

## 2017-12-01 NOTE — Progress Notes (Signed)
HR dropped while working with OT this am.  Pt was insistent on having cardiac medications before PT.  MD made aware. No new orders. RN will monitor.

## 2017-12-01 NOTE — Progress Notes (Signed)
Subjective: 1 Day Post-Op Procedure(s) (LRB): RIGHT TOTAL KNEE ARTHROPLASTY (Right) Patient reports pain as moderate.    Objective: Vital signs in last 24 hours: Temp:  [97.7 F (36.5 C)-98.1 F (36.7 C)] 98.1 F (36.7 C) (10/19 0829) Pulse Rate:  [33-80] 67 (10/19 1000) Resp:  [14-15] 15 (10/18 2137) BP: (99-127)/(59-74) 108/59 (10/19 0829) SpO2:  [99 %-100 %] 99 % (10/19 0945)  Intake/Output from previous day: 10/18 0701 - 10/19 0700 In: 5611 [P.O.:1920; I.V.:3437.1; IV Piggyback:253.9] Out: 5350 [Urine:5300; Blood:50] Intake/Output this shift: Total I/O In: 240 [P.O.:240] Out: 800 [Urine:800]  Recent Labs    12/01/17 0452  HGB 10.3*   Recent Labs    12/01/17 0452  WBC 17.9*  RBC 3.66*  HCT 31.7*  PLT 230   Recent Labs    12/01/17 0452  NA 132*  K 4.6  CL 101  CO2 23  BUN 16  CREATININE 1.07  GLUCOSE 187*  CALCIUM 8.5*   Recent Labs    11/30/17 0607  INR 1.04    Sensation intact distally Intact pulses distally Dorsiflexion/Plantar flexion intact Incision: dressing C/D/I No cellulitis present Compartment soft  Assessment/Plan: 1 Day Post-Op Procedure(s) (LRB): RIGHT TOTAL KNEE ARTHROPLASTY (Right) Up with therapy Plan for discharge tomorrow Discharge home with home health    [name] 12/01/2017, 10:54 AM

## 2017-12-01 NOTE — Consult Note (Signed)
Medical Consultation   Terry [name]  YHC:[id number]  DOB: 03/30/1955  DOA: 11/30/2017  PCP: [name], MD   Outpatient Specialists: [facility] cards    Requesting physician: Dr. Ophelia Charter  Reason for consultation: Bradycardia   History of Present Illness: Terry [name] is an 62 y.o. male with h/o systolic CHF, NICM, AICD, A.Fib.  Patient is POD #1 of TKA.  This morning while working with PT/OT after taking his usual AM meds his HR was reported to be in the 30s at times, 70s at other times.  He was asymptomatic with this.  His coreg was held this evening, HR was again noted to be as low as 32.  Again asymptomatic.  Hospitalist called for consult.  Review of Systems:  ROS As per HPI otherwise 10 point review of systems negative.     Past Medical History: Past Medical History:  Diagnosis Date  . AICD (automatic cardioverter/defibrillator) present   . Anemia    "S/P knee & hip OR"  . Aortic insufficiency    mild   . Aortic root dilatation (HCC)   . Atrial fibrillation (HCC)    "off and on since 2013" (08/05/2014)  . Chronic systolic CHF (congestive heart failure), NYHA class 1 (HCC)   . Complication of anesthesia    hx of irregular beat after anesthesia in ~ 1990's  . Dysrhythmia   . Ejection fraction < 50%    by echo 08/25/11   . GERD (gastroesophageal reflux disease)   . H/O hematuria   . H/O hiatal hernia   . 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 History of blood transfusion 2013; 2014   S/P knee and hip OR  . Hypertension   . 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 Rheumatoid arthritis (HCC) dx'd 1995    Past Surgical History: Past Surgical History:  Procedure Laterality Date  . CARDIAC CATHETERIZATION  ~ 2004   normal  . CARDIAC CATHETERIZATION N/A 09/07/2014   Procedure: Left Heart Cath and Coronary Angiography;  Surgeon: Laurey Morale, MD;  Location: [facility] INVASIVE CV LAB;  Service: Cardiovascular;  Laterality: N/A;    . CARDIAC DEFIBRILLATOR PLACEMENT  08/05/2014   [employer] Jude  . CARDIOVERSION N/A 08/21/2013   Procedure: CARDIOVERSION;  Surgeon: Quintella Reichert, MD;  Location: [facility] ENDOSCOPY;  Service: Cardiovascular;  Laterality: N/A;  . COLONOSCOPY  2010; 2015   "polyps; no polyps"  . EP IMPLANTABLE DEVICE N/A 08/05/2014   Procedure: ICD Implant;  Surgeon: [name], MD;  Location: [facility] INVASIVE CV LAB;  Service: Cardiovascular;  Laterality: N/A;  . 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 JOINT REPLACEMENT    . NASAL SEPTUM SURGERY  ~ 1975  . TONSILLECTOMY    . TOTAL HIP ARTHROPLASTY  09/08/2011   Procedure: TOTAL HIP ARTHROPLASTY ANTERIOR APPROACH;  Surgeon: [name], MD;  Location: [facility] ORS;  Service: Orthopedics;  Laterality: Right;  Right total hip replacement, left knee steroid injection  . TOTAL KNEE ARTHROPLASTY Left 11/29/2012   Procedure: LEFT TOTAL KNEE ARTHROPLASTY;  Surgeon: [name], MD;  Location: [facility] ORS;  Service: Orthopedics;  Laterality: Left;  FEMORAL NERVE BLOCK IN HOLDING AREA LEFT LEG  . URETHRAL FISTULA REPAIR  ~ 1996  . URETHRAL STRICTURE DILATATION  "several times"     Allergies:   Allergies  Allergen Reactions  . Spironolactone     Gynecomastia  . Sulfa Antibiotics     Thrush and hives     Social History:  reports that he has never smoked. He has never used smokeless tobacco. He reports that he drinks about 1.0 standard drinks of alcohol per week. He reports that he does not use drugs.   Family History: Family History  Problem Relation Age of Onset  . Heart disease Father   . Heart attack Father 52  . Heart failure Brother      Physical Exam: Vitals:   12/01/17 0829 12/01/17 0945 12/01/17 1000 12/01/17 1329  BP: (!) 108/59   102/64  Pulse: 69 (!) 33 67 (!) 32  Resp:      Temp: 98.1 F (36.7 C)   97.8 F (36.6 C)  TempSrc: Oral   Oral  SpO2: 100% 99%  100%  Weight:      Height:        Constitutional:  Appearance,  Alert and awake, oriented x3, not in any acute distress. Eyes: PERLA, EOMI, irises appear normal, anicteric sclera,  ENMT: external ears and nose appear normal            Lips appears normal, oropharynx mucosa, tongue, posterior pharynx appear normal  Neck: neck appears normal, no masses, normal ROM, no thyromegaly, no JVD  CVS: S1-S2 clear, no murmur rubs or gallops, no LE edema, normal pedal pulses  Respiratory:  clear to auscultation bilaterally, no wheezing, rales or rhonchi. Respiratory effort normal. No accessory muscle use.  Abdomen: soft nontender, nondistended, normal bowel sounds, no hepatosplenomegaly, no hernias  Musculoskeletal: : no cyanosis, clubbing or edema noted bilaterally Neuro: Cranial nerves II-XII intact, strength, sensation, reflexes Psych: judgement and insight appear normal, stable mood and affect, mental status Skin: no rashes or lesions or ulcers, no induration or nodules    Data reviewed:  I have personally reviewed following labs and imaging studies Labs:  CBC: Recent Labs  Lab 12/01/17 0452  WBC 17.9*  HGB 10.3*  HCT 31.7*  MCV 86.6  PLT 230    Basic Metabolic Panel: Recent Labs  Lab 12/01/17 0452  NA 132*  K 4.6  CL 101  CO2 23  GLUCOSE 187*  BUN 16  CREATININE 1.07  CALCIUM 8.5*   GFR Estimated Creatinine Clearance: 76.2 mL/min (by C-G formula based on SCr of 1.07 mg/dL). Liver Function Tests: No results for input(s): AST, ALT, ALKPHOS, BILITOT, PROT, ALBUMIN in the last 168 hours. No results for input(s): LIPASE, AMYLASE in the last 168 hours. No results for input(s): AMMONIA in the last 168 hours. Coagulation profile Recent Labs  Lab 11/30/17 0607  INR 1.04    Cardiac Enzymes: No results for input(s): CKTOTAL, CKMB, CKMBINDEX, TROPONINI in the last 168 hours. BNP: Invalid input(s): POCBNP CBG: No results for input(s): GLUCAP in the last 168 hours. D-Dimer No results for input(s): DDIMER in the last 72  hours. Hgb A1c No results for input(s): HGBA1C in the last 72 hours. Lipid Profile No results for input(s): CHOL, HDL, LDLCALC, TRIG, CHOLHDL, LDLDIRECT in the last 72 hours. Thyroid function studies No results for input(s): TSH, T4TOTAL, T3FREE, THYROIDAB in the last 72 hours.  Invalid input(s): FREET3 Anemia work up No results for input(s): VITAMINB12, FOLATE, FERRITIN, TIBC, IRON, RETICCTPCT in the last 72 hours. Urinalysis    Component Value Date/Time   COLORURINE YELLOW 11/25/2012 0830   APPEARANCEUR CLEAR 11/25/2012 0830   LABSPEC 1.014 11/25/2012 0830  PHURINE 5.5 11/25/2012 0830   GLUCOSEU NEGATIVE 11/25/2012 0830   HGBUR MODERATE (A) 11/25/2012 0830   BILIRUBINUR NEGATIVE 11/25/2012 0830   KETONESUR NEGATIVE 11/25/2012 0830   PROTEINUR NEGATIVE 11/25/2012 0830   UROBILINOGEN 0.2 11/25/2012 0830   NITRITE NEGATIVE 11/25/2012 0830   LEUKOCYTESUR NEGATIVE 11/25/2012 0830     Microbiology Recent Results (from the past 240 hour(s))  Surgical pcr screen     Status: None   Collection Time: 11/23/17 10:31 AM  Result Value Ref Range Status   MRSA, PCR NEGATIVE NEGATIVE Final   Staphylococcus aureus NEGATIVE NEGATIVE Final    Comment: (NOTE) The Xpert SA Assay (FDA approved for NASAL specimens in patients 32 years of age and older), is one component of a comprehensive surveillance program. It is not intended to diagnose infection nor to guide or monitor treatment. Performed at [facility], [address], [place], [place] [address]        Inpatient Medications:   Scheduled Meds: . docusate sodium  100 mg Oral BID  . dofetilide  250 mcg Oral BID  . furosemide  40 mg Oral QODAY  . furosemide  60 mg Oral QODAY  . pantoprazole  40 mg Oral Daily  . rivaroxaban  20 mg Oral Q supper  . sacubitril-valsartan  1 tablet Oral BID  . spironolactone  25 mg Oral Daily   Continuous Infusions: . sodium chloride Stopped (12/01/17 1213)  . methocarbamol  (ROBAXIN) IV 500 mg (11/30/17 0930)     Radiological Exams on Admission: Dg Knee Right Port  Result Date: 11/30/2017 CLINICAL DATA:  Status post RIGHT-sided total knee replacement. EXAM: PORTABLE RIGHT KNEE - 1-2 VIEW COMPARISON:  None. FINDINGS: RIGHT knee arthroplasty hardware appears intact and appropriately positioned. Osseous alignment is anatomic. Expected postsurgical changes within the surrounding soft tissues. IMPRESSION: Status post RIGHT knee arthroplasty. No evidence of surgical complicating feature. Electronically Signed   By: Bary Richard M.D.   On: 11/30/2017 09:33    Impression/Recommendations Principal Problem:   Unilateral primary osteoarthritis, right knee Active Problems:   Status post total knee replacement, right   Bradycardia with 31-40 beats per minute  1. Bradycardia - asymptomatic 1. Possibly just Bigeminy which is noted for a few beats on the EKG obtained this evening with the PVCs being non-perfused 1. Would explain why asymptomatic 2. Also unlikely to have actual electrical HR this low, given that he has an AICD in place (which would presumably pace him before he got to HR 32). 2. Will put patient on tele monitor 3. Will check CMP, Mg to make sure no electrolyte disturbance 4. Holding coreg for the moment 5. Spoke with Dr. Drinda Butts who is on call for cardiology: 1. Hold Coreg for the moment until we can prove its just bigeminy. 2. Continue other meds including Tikosyn 3. Interrogate pacemaker 4. Cards will consult in AM. 2. S/P R TKA - progressing well, was supposed to be discharged tomorrow in-fact, may still be if cards clears him.   Thank you for this consultation.  Our [facility] hospitalist team will follow the patient with you.     GARDNER, JARED M. D.O. [facility] 12/01/2017, 8:23 PM

## 2017-12-01 NOTE — Progress Notes (Signed)
Paged on call MD RE: pt heart rate and rhythm. OK from DR Ophelia Charter to do EKG and he will contact medical to get them involved. Jackelyn Knife, RN

## 2017-12-01 NOTE — Progress Notes (Signed)
Physical Therapy Treatment Patient Details Name: Terry [name] MRN: [id number] DOB: 03-23-1955 Today's Date: 12/01/2017    History of Present Illness Pt s/p R TKR and with hx of L TKR, R THR, a-fib and RA.      PT Comments    Pt eager to mobilize and ambulated increased distance in hall this am - no c/o dizziness.   Follow Up Recommendations  Home health PT;Follow surgeon's recommendation for DC plan and follow-up therapies     Equipment Recommendations  3in1 (PT)    Recommendations for Other Services OT consult     Precautions / Restrictions Precautions Precautions: Fall Precaution Comments: monitor HR Required Braces or Orthoses: Knee Immobilizer - Right Knee Immobilizer - Right: Discontinue once straight leg raise with < 10 degree lag Restrictions Weight Bearing Restrictions: No RLE Weight Bearing: Weight bearing as tolerated    Mobility  Bed Mobility Overal bed mobility: Needs Assistance Bed Mobility: Supine to Sit     Supine to sit: Min assist     General bed mobility comments: Up in chair and requests back to same to eat lunch  Transfers Overall transfer level: Needs assistance Equipment used: Rolling walker (2 wheeled) Transfers: Sit to/from Stand Sit to Stand: Min assist         General transfer comment: verbal cues for hand placement and LE management.  Min assist to bring wt up and fwd.  Pt ltd by UE arthritic changes  Ambulation/Gait Ambulation/Gait assistance: Min assist Gait Distance (Feet): 111 Feet Assistive device: Rolling walker (2 wheeled) Gait Pattern/deviations: Decreased step length - right;Decreased step length - left;Shuffle;Trunk flexed;Step-to pattern;Step-through pattern;Antalgic Gait velocity: decr   General Gait Details: cues for sequence, posture and position from [employer]             Wheelchair Mobility    Modified Rankin (Stroke Patients Only)       Balance Overall balance assessment: Needs  assistance Sitting-balance support: No upper extremity supported;Feet supported Sitting balance-Leahy Scale: Good     Standing balance support: Bilateral upper extremity supported Standing balance-Leahy Scale: Poor                              Cognition Arousal/Alertness: Awake/alert Behavior During Therapy: WFL for tasks assessed/performed Overall Cognitive Status: Within Functional Limits for tasks assessed                                        Exercises Total Joint Exercises Ankle Circles/Pumps: AROM;Both;15 reps;Supine Quad Sets: AROM;Both;10 reps;Supine Heel Slides: AAROM;Right;15 reps;Supine Straight Leg Raises: AAROM;Right;15 reps;Supine Goniometric ROM: AAROM R knee -12 - 50 - pain limited with muscle guarding    General Comments        Pertinent Vitals/Pain Pain Assessment: 0-10 Pain Score: 4  Pain Location: R knee Pain Descriptors / Indicators: Aching;Sore Pain Intervention(s): Limited activity within patient's tolerance;Monitored during session;Premedicated before session;Ice applied    Home Living Family/patient expects to be discharged to:: Private residence Living Arrangements: Spouse/significant other Available Help at Discharge: Family Type of Home: House Home Access: Stairs to enter Entrance Stairs-Rails: Right;Left Home Layout: Able to live on main level with bedroom/bathroom Home Equipment: Walker - 2 wheels;Shower seat      Prior Function Level of Independence: Independent          PT Goals (current goals can  now be found in the care plan section) Acute Rehab PT Goals Patient Stated Goal: become more independent. PT Goal Formulation: With patient Time For Goal Achievement: 12/07/17 Potential to Achieve Goals: Good Progress towards PT goals: Progressing toward goals    Frequency    7X/week      PT Plan Current plan remains appropriate    Co-evaluation              AM-PAC PT "6 Clicks" Daily  Activity  Outcome Measure  Difficulty turning over in bed (including adjusting bedclothes, sheets and blankets)?: Unable Difficulty moving from lying on back to sitting on the side of the bed? : Unable Difficulty sitting down on and standing up from a chair with arms (e.g., wheelchair, bedside commode, etc,.)?: Unable Help needed moving to and from a bed to chair (including a wheelchair)?: A Lot Help needed walking in hospital room?: A Little Help needed climbing 3-5 steps with a railing? : A Lot 6 Click Score: 10    End of Session Equipment Utilized During Treatment: Gait belt;Right knee immobilizer Activity Tolerance: Patient tolerated treatment well Patient left: in chair;with call bell/phone within reach Nurse Communication: Mobility status PT Visit Diagnosis: Difficulty in walking, not elsewhere classified (R26.2)     Time: 6440-3474 PT Time Calculation (min) (ACUTE ONLY): 18 min  Charges:  $Gait Training: 8-22 mins $Therapeutic Exercise: 8-22 mins                     Mauro Kaufmann PT Acute Rehabilitation Services Pager [phone] Office [phone]    Terry [name] 12/01/2017, 2:23 PM

## 2017-12-02 DIAGNOSIS — I428 Other cardiomyopathies: Secondary | ICD-10-CM

## 2017-12-02 DIAGNOSIS — Z95810 Presence of automatic (implantable) cardiac defibrillator: Secondary | ICD-10-CM

## 2017-12-02 DIAGNOSIS — I493 Ventricular premature depolarization: Secondary | ICD-10-CM

## 2017-12-02 DIAGNOSIS — R001 Bradycardia, unspecified: Secondary | ICD-10-CM

## 2017-12-02 LAB — CBC
HCT: 30.5 % — ABNORMAL LOW (ref 39.0–52.0)
HEMOGLOBIN: 9.9 g/dL — AB (ref 13.0–17.0)
MCH: 28 pg (ref 26.0–34.0)
MCHC: 32.5 g/dL (ref 30.0–36.0)
MCV: 86.2 fL (ref 80.0–100.0)
PLATELETS: 219 10*3/uL (ref 150–400)
RBC: 3.54 MIL/uL — AB (ref 4.22–5.81)
RDW: 12.8 % (ref 11.5–15.5)
WBC: 12.8 10*3/uL — AB (ref 4.0–10.5)
nRBC: 0 % (ref 0.0–0.2)

## 2017-12-02 LAB — BASIC METABOLIC PANEL
Anion gap: 10 (ref 5–15)
BUN: 18 mg/dL (ref 8–23)
CHLORIDE: 99 mmol/L (ref 98–111)
CO2: 26 mmol/L (ref 22–32)
CREATININE: 1 mg/dL (ref 0.61–1.24)
Calcium: 8.9 mg/dL (ref 8.9–10.3)
Glucose, Bld: 110 mg/dL — ABNORMAL HIGH (ref 70–99)
POTASSIUM: 4.3 mmol/L (ref 3.5–5.1)
SODIUM: 135 mmol/L (ref 135–145)

## 2017-12-02 LAB — MAGNESIUM: MAGNESIUM: 1.9 mg/dL (ref 1.7–2.4)

## 2017-12-02 MED ORDER — CARVEDILOL 25 MG PO TABS
25.0000 mg | ORAL_TABLET | Freq: Two times a day (BID) | ORAL | Status: DC
  Filled 2017-12-02: qty 1

## 2017-12-02 MED ORDER — MAGNESIUM SULFATE 2 GM/50ML IV SOLN
2.0000 g | Freq: Once | INTRAVENOUS | Status: AC
  Administered 2017-12-02: 2 g via INTRAVENOUS
  Filled 2017-12-02: qty 50

## 2017-12-02 MED ORDER — CARVEDILOL 25 MG PO TABS
25.0000 mg | ORAL_TABLET | Freq: Once | ORAL | Status: AC
  Administered 2017-12-02: 25 mg via ORAL
  Filled 2017-12-02: qty 1

## 2017-12-02 MED ORDER — EPLERENONE 25 MG PO TABS
50.0000 mg | ORAL_TABLET | Freq: Every day | ORAL | Status: DC
  Administered 2017-12-02: 50 mg via ORAL

## 2017-12-02 MED ORDER — NON FORMULARY: 50.0000 mg | Freq: Every day | Status: DC

## 2017-12-02 NOTE — Care Management Note (Addendum)
Case Management Note  Patient Details  Name: ARY LAVINE MRN: [id number] Date of Birth: 05-27-1955  Subjective/Objective:   Right TKA                 Action/Plan: NCM spoke to pt and offered choice for [facility]. Pt requested [facility] for HHPT. Contacted [facility] to make aware. (preoperatively arranged by surgeon's office). Pt has RW and contacted [facility] for 3n1 bedside commode to be delivered to room prior to dc.   [facility] unable to accept referral. No F2F available with [facility] PT order. Contacted [facility] and they will follow with pt for HHPT.   Expected Discharge Date:  12/01/17               Expected Discharge Plan:  Home w Home Health Services  In-House Referral:  NA  Discharge planning Services  CM Consult  Post Acute Care Choice:  Home Health Choice offered to:  Patient  DME Arranged:  3-N-1 DME Agency:  [facility]  HH Arranged:  PT [facility] Agency:  [facility]  Status of Service:  Completed, signed off  If discussed at Long Length of Stay Meetings, dates discussed:    Additional Comments:  Elliot Cousin, RN 12/02/2017, 9:58 AM

## 2017-12-02 NOTE — Progress Notes (Signed)
Pt discharged to home, instructions given to pt and daughter, acknowledged understanding. SRP, RN

## 2017-12-02 NOTE — Progress Notes (Signed)
Occupational Therapy Treatment Patient Details Name: Terry [name] MRN: [id number] DOB: [birth year] Today's Date: 12/02/2017    History of present illness Pt s/p R TKR and with hx of L TKR, R THR, a-fib and RA.     OT comments  All OT education completed and patient questions answered. No further OT needs identified at this time. Patient states he is being discharged home today. Wife will assist at home.   Follow Up Recommendations  No OT follow up    Equipment Recommendations  3 in 1 bedside commode    Recommendations for Other Services      Precautions / Restrictions Precautions Precautions: Fall Required Braces or Orthoses: Knee Immobilizer - Right Knee Immobilizer - Right: Discontinue once straight leg raise with < 10 degree lag Restrictions Weight Bearing Restrictions: No RLE Weight Bearing: Weight bearing as tolerated       Mobility Bed Mobility Overal bed mobility: Needs Assistance Bed Mobility: Supine to Sit;Sit to Supine     Supine to sit: Min assist Sit to supine: Min assist   General bed mobility comments: assitance with RLE  Transfers Overall transfer level: Needs assistance Equipment used: Rolling walker (2 wheeled) Transfers: Sit to/from Stand Sit to Stand: Min assist              Balance                                           ADL either performed or assessed with clinical judgement   ADL                                         General ADL Comments: Patient states he has a Sports administrator and a long sponge for use at home. He will sponge bathe for now. Wife will assist as needed with LB self-care. Patient did practice ambulation to bathroom for 3 in 1 over toilet transfer. 3 in 1 needed for home as his old one is "rusted out." RN is aware and has already called care management.     Vision       Perception     Praxis      Cognition Arousal/Alertness: Awake/alert Behavior During Therapy: WFL for tasks  assessed/performed Overall Cognitive Status: Within Functional Limits for tasks assessed                                          Exercises     Shoulder Instructions       General Comments      Pertinent Vitals/ Pain       Pain Assessment: 0-10 Pain Score: 3  Pain Descriptors / Indicators: Aching;Sore Pain Intervention(s): Limited activity within patient's tolerance;Monitored during session;Repositioned;Ice applied  Home Living                                          Prior Functioning/Environment              Frequency  Min 2X/week        Progress Toward Goals  OT Goals(current goals can now be  found in the care plan section)  Progress towards OT goals: Progressing toward goals  Acute Rehab OT Goals Patient Stated Goal: become more independent. OT Goal Formulation: All assessment and education complete, DC therapy  Plan Discharge plan remains appropriate    Co-evaluation                 AM-PAC PT "6 Clicks" Daily Activity     Outcome Measure   Help from another person eating meals?: None Help from another person taking care of personal grooming?: A Little Help from another person toileting, which includes using toliet, bedpan, or urinal?: A Little Help from another person bathing (including washing, rinsing, drying)?: A Little Help from another person to put on and taking off regular upper body clothing?: None Help from another person to put on and taking off regular lower body clothing?: A Lot 6 Click Score: 19    End of Session Equipment Utilized During Treatment: Rolling walker;Right knee immobilizer  OT Visit Diagnosis: Unsteadiness on feet (R26.81);Muscle weakness (generalized) (M62.81)   Activity Tolerance Patient tolerated treatment well   Patient Left in bed;with call bell/phone within reach;with family/visitor present   Nurse Communication Other (comment)(3 in 1 for home)        Time:  7342-8768 OT Time Calculation (min): 16 min  Charges: OT General Charges $OT Visit: 1 Visit OT Treatments $Self Care/Home Management : 8-22 mins    Uday Jantz A Ciria Bernardini 12/02/2017, 10:51 AM

## 2017-12-02 NOTE — Consult Note (Signed)
Cardiology Consultation:   Patient ID: Terry [name] MRN: [id number]; DOB: [birth year]  Admit date: 11/30/2017 Date of Consult: 12/02/2017  Primary Care Provider: [name], MD Primary Cardiologist:  Dr Mayford Knife Primary Electrophysiologist:  Dr [name]   Patient Profile:   Terry [name] is a 62 y.o. male with a hx of  Chronic systolic CHF, NICMP, s/p ICD placement, known PVCs who is being seen today for the evaluation of bradycardia, PVCs at the request of Hillary Bow, DO.  History of Present Illness:   Terry [name] is a 62 y.o. male with a hx of chronic systolic CHF due to nonischemic cardiomyopathy NYHA class I-II, HTN, and paroxysmal AF s/p St Jude ICD 08/05/14. He had notable LV EF decline despiteguideline directed therapy and wasreferred to AHF clinic for evaluation. Back in 2006, an echo showed EF 15-20%. LHC at that time showed normal coronaries. He has been managed for cardiomyopathy since that time. Atrial fibrillation was first noted in 2013. In 7/15, he had DCCV to NSR. In 1/16, he was noted to be back in atrial fibrillation. He had St Jude ICD placed by Dr [name] in 6/16. CRT was not placed due to narrow QRS.  Given fall in EF, Lexiscan Cardiolite was done. This showed EF 39% with partially reversible inferior and apical perfusion defect. He had LHC in 7/16 showing no significant CAD. In 8/16, he was admitted for Tikosyn initiation and converted to NSR. He has been maintaining NSR since then. His most recent echo 10/04/2016 showed EF 35-40% and stable dilated aortic root at 35mm. He is followed in advanced heart failure clinic by Dr. [name] and has been doing well.  He did get gynecomastia with Spironolactone and this was changed to eplerenone 50 mg daily.  He is being followed by Dr [name] for his ICD and frequent PVCs, last seen on 10/12/2017 when Dr [name] stated: that his PVC's are minimally symptomatic but he was concerned that he could be having over 20K in 24 hours  which would increase his risk for developing worse CHF. He wore  a cardiac monitor for 24 hours that showed NSR with frequent ventricular ectopy but no sustained VT,  NS atrial tachy at 100/min, 8% PVC's, No prolonged pauses, Occaisional PAC's, Dr [name] stated that he wants to continue the same medical management.  The patient was admitted for TKA and yesterday while working with PT he developed bradycardia down to 33' - there is no record on telemetry and the patient was asymptomatic. His coreg was held yesterday.   Past Medical History:  Diagnosis Date  . AICD (automatic cardioverter/defibrillator) present   . Anemia    "S/P knee & hip OR"  . Aortic insufficiency    mild   . Aortic root dilatation (HCC)   . Atrial fibrillation (HCC)    "off and on since 2013" (08/05/2014)  . Chronic systolic CHF (congestive heart failure), NYHA class 1 (HCC)   . Complication of anesthesia    hx of irregular beat after anesthesia in ~ 1990's  . Dysrhythmia   . Ejection fraction < 50%    by echo 08/25/11   . GERD (gastroesophageal reflux disease)   . H/O hematuria   . H/O hiatal hernia   . 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 History of blood transfusion 2013; 2014   S/P knee and hip OR  . Hypertension   . 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 Rheumatoid  arthritis (HCC) dx'd 1995    Past Surgical History:  Procedure Laterality Date  . CARDIAC CATHETERIZATION  ~ 2004   normal  . CARDIAC CATHETERIZATION N/A 09/07/2014   Procedure: Left Heart Cath and Coronary Angiography;  Surgeon: Laurey Morale, MD;  Location: [facility] INVASIVE CV LAB;  Service: Cardiovascular;  Laterality: N/A;  . CARDIAC DEFIBRILLATOR PLACEMENT  08/05/2014   St. Jude  . CARDIOVERSION N/A 08/21/2013   Procedure: CARDIOVERSION;  Surgeon: Quintella Reichert, MD;  Location: [facility] ENDOSCOPY;  Service: Cardiovascular;  Laterality: N/A;  . COLONOSCOPY  2010; 2015   "polyps; no polyps"  . EP IMPLANTABLE DEVICE N/A  08/05/2014   Procedure: ICD Implant;  Surgeon: [name], MD;  Location: [facility] INVASIVE CV LAB;  Service: Cardiovascular;  Laterality: N/A;  . 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 JOINT REPLACEMENT    . NASAL SEPTUM SURGERY  ~ 1975  . TONSILLECTOMY    . TOTAL HIP ARTHROPLASTY  09/08/2011   Procedure: TOTAL HIP ARTHROPLASTY ANTERIOR APPROACH;  Surgeon: [name], MD;  Location: [facility] ORS;  Service: Orthopedics;  Laterality: Right;  Right total hip replacement, left knee steroid injection  . TOTAL KNEE ARTHROPLASTY Left 11/29/2012   Procedure: LEFT TOTAL KNEE ARTHROPLASTY;  Surgeon: [name], MD;  Location: [facility] ORS;  Service: Orthopedics;  Laterality: Left;  FEMORAL NERVE BLOCK IN HOLDING AREA LEFT LEG  . URETHRAL FISTULA REPAIR  ~ 1996  . URETHRAL STRICTURE DILATATION  "several times"   Inpatient Medications: Scheduled Meds: . carvedilol  25 mg Oral BID WC  . docusate sodium  100 mg Oral BID  . dofetilide  250 mcg Oral BID  . eplerenone  50 mg Oral Daily  . furosemide  40 mg Oral QODAY  . furosemide  60 mg Oral QODAY  . pantoprazole  40 mg Oral Daily  . rivaroxaban  20 mg Oral Q supper  . sacubitril-valsartan  1 tablet Oral BID   Continuous Infusions: . sodium chloride Stopped (12/01/17 1213)  . methocarbamol (ROBAXIN) IV Stopped (12/01/17 1000)   PRN Meds: acetaminophen, alum & mag hydroxide-simeth, diphenhydrAMINE, HYDROmorphone (DILAUDID) injection, menthol-cetylpyridinium **OR** phenol, methocarbamol **OR** methocarbamol (ROBAXIN) IV, metoCLOPramide **OR** metoCLOPramide (REGLAN) injection, ondansetron **OR** ondansetron (ZOFRAN) IV, oxyCODONE, oxyCODONE, polyethylene glycol, zolpidem  Allergies:    Allergies  Allergen Reactions  . Spironolactone     Gynecomastia  . Sulfa Antibiotics     Thrush and hives    Social History:   Social History   Socioeconomic History  . Marital status: Married    Spouse name: Not on file  .  Number of children: Not on file  . Years of education: Not on file  . Highest education level: Not on file  Occupational History  . Not on file  Social Needs  . Financial resource strain: Not on file  . Food insecurity:    Worry: Not on file    Inability: Not on file  . Transportation needs:    Medical: Not on file    Non-medical: Not on file  Tobacco Use  . Smoking status: Never Smoker  . Smokeless tobacco: Never Used  Substance and Sexual Activity  . Alcohol use: Yes    Alcohol/week: 1.0 standard drinks    Types: 1 Cans of beer per week  . Drug use: No  . Sexual activity: Yes  Lifestyle  . Physical activity:    Days per week: Not on file  Minutes per session: Not on file  . Stress: Not on file  Relationships  . Social connections:    Talks on phone: Not on file    Gets together: Not on file    Attends religious service: Not on file    Active member of club or organization: Not on file    Attends meetings of clubs or organizations: Not on file    Relationship status: Not on file  . Intimate partner violence:    Fear of current or ex partner: Not on file    Emotionally abused: Not on file    Physically abused: Not on file    Forced sexual activity: Not on file  Other Topics Concern  . Not on file  Social History Narrative  . Not on file    Family History:    Family History  Problem Relation Age of Onset  . Heart disease Father   . Heart attack Father 23  . Heart failure Brother      ROS:  Please see the history of present illness.  All other ROS reviewed and negative.     Physical Exam/Data:   Vitals:   12/01/17 2252 12/01/17 2254 12/01/17 2300 12/02/17 0549  BP: 115/74   110/79  Pulse: (!) 37 73  80  Resp: 14   18  Temp: 98.6 F (37 C)   98.8 F (37.1 C)  TempSrc: Oral   Oral  SpO2: 100%   95%  Weight:   97.4 kg   Height:   5\' 11"  (1.803 m)     Intake/Output Summary (Last 24 hours) at 12/02/2017 0904 Last data filed at 12/02/2017  0552 Gross per 24 hour  Intake 1475.34 ml  Output 3950 ml  Net -2474.66 ml   Filed Weights   11/30/17 0550 12/01/17 2300  Weight: 89.4 kg 97.4 kg   Body mass index is 29.95 kg/m.  General:  Well nourished, well developed, in no acute distress HEENT: normal Lymph: no adenopathy Neck: no JVD Endocrine:  No thryomegaly Vascular: No carotid bruits; FA pulses 2+ bilaterally without bruits  Cardiac:  normal S1, S2; RRR; no murmur  Lungs:  clear to auscultation bilaterally, no wheezing, rhonchi or rales  Abd: soft, nontender, no hepatomegaly  Ext: no edema Musculoskeletal:  No deformities, BUE and BLE strength normal and equal Skin: warm and dry  Neuro:  CNs 2-12 intact, no focal abnormalities noted Psych:  Normal affect   EKG:  The EKG was personally reviewed and demonstrates:   Telemetry:  Telemetry was personally reviewed and demonstrates:  NSR, frequent bigeminy, 1 run of nsVT - 3 beats total, personally reviewed  Relevant CV Studies: 24 hour Holter monitor: 11-01-17 NSR with frequent ventricular ectopy but no sustained VT 2. NS atrial tachy at 100/min 3. 8% PVC's 4. No prolonged pauses 5. Occaisional PAC's  Laboratory Data:  Chemistry Recent Labs  Lab 12/01/17 0452 12/01/17 2050 12/02/17 0501  NA 132* 130* 135  K 4.6 4.0 4.3  CL 101 96* 99  CO2 23 26 26   GLUCOSE 187* 191* 110*  BUN 16 18 18   CREATININE 1.07 1.09 1.00  CALCIUM 8.5* 8.5* 8.9  GFRNONAA >60 >60 >60  GFRAA >60 >60 >60  ANIONGAP 8 8 10     Recent Labs  Lab 12/01/17 2050  PROT 6.7  ALBUMIN 3.3*  AST 14*  ALT 7  ALKPHOS 62  BILITOT 0.6   Hematology Recent Labs  Lab 12/01/17 0452 12/02/17 0501  WBC 17.9* 12.8*  RBC 3.66* 3.54*  HGB 10.3* 9.9*  HCT 31.7* 30.5*  MCV 86.6 86.2  MCH 28.1 28.0  MCHC 32.5 32.5  RDW 12.4 12.8  PLT 230 219   Cardiac EnzymesNo results for input(s): TROPONINI in the last 168 hours. No results for input(s): TROPIPOC in the last 168 hours.  BNPNo results  for input(s): BNP, PROBNP in the last 168 hours.  DDimer No results for input(s): DDIMER in the last 168 hours.  Radiology/Studies:  Dg Knee Right Port  Result Date: 11/30/2017 CLINICAL DATA:  Status post RIGHT-sided total knee replacement. EXAM: PORTABLE RIGHT KNEE - 1-2 VIEW COMPARISON:  None. FINDINGS: RIGHT knee arthroplasty hardware appears intact and appropriately positioned. Osseous alignment is anatomic. Expected postsurgical changes within the surrounding soft tissues. IMPRESSION: Status post RIGHT knee arthroplasty. No evidence of surgical complicating feature. Electronically Signed   By: Bary Richard M.D.   On: 11/30/2017 09:33    Assessment and Plan:   1. Asymptomatic bradycardia - most probably just compensatory pause post PVC, no therapy needed, very recent Holter monitor, no interrogation needed, he has one scheduled for next week as outpatient. Please restart carvedilol 2. PVCs - 8% on recent Holter monitor, continue current management 3. PAF - continue carvedilol and Tikosyn 4. Chronic systolic heart failure - his symptoms are class 2. He is euvolemic. Restart Eplerenone, he is allergic to spironolactone.  [facility] will sign off.   Medication Recommendations:  As above Other recommendations (labs, testing, etc):  No further testing, the patient can be discharged home today, we will follow as outpatient. Follow up as an outpatient:  Per schedule.  For questions or updates, please contact [facility] Please consult [email or web address] for contact info under    Signed, Tobias Alexander, MD  12/02/2017 9:04 AM

## 2017-12-02 NOTE — Progress Notes (Signed)
Physical Therapy Treatment Patient Details Name: Terry [name] MRN: [id number] DOB: 05-23-1955 Today's Date: 12/02/2017    History of Present Illness Pt s/p R TKR and with hx of L TKR, R THR, a-fib and RA.      PT Comments    Pt doing well, feels ready to d/c home; reviewed stairs, gait , HEP for completion later today;  Follow Up Recommendations  Home health PT;Follow surgeon's recommendation for DC plan and follow-up therapies     Equipment Recommendations  3in1 (PT)    Recommendations for Other Services       Precautions / Restrictions Precautions Precautions: Fall Required Braces or Orthoses: Knee Immobilizer - Right Knee Immobilizer - Right: Discontinue once straight leg raise with < 10 degree lag Restrictions Weight Bearing Restrictions: No RLE Weight Bearing: Weight bearing as tolerated    Mobility  Bed Mobility Overal bed mobility: Needs Assistance Bed Mobility: Supine to Sit;Sit to Supine     Supine to sit: Supervision Sit to supine: Supervision   General bed mobility comments: incr time, supervision for safety, cues to self assist  Transfers Overall transfer level: Needs assistance Equipment used: Rolling walker (2 wheeled) Transfers: Sit to/from Stand Sit to Stand: Supervision;Min guard         General transfer comment: verbal cues for hand placement and control of descent  Ambulation/Gait Ambulation/Gait assistance: Min guard;Supervision Gait Distance (Feet): 120 Feet Assistive device: Rolling walker (2 wheeled) Gait Pattern/deviations: Step-to pattern;Decreased step length - right;Decreased step length - left;Trunk flexed     General Gait Details: cues for sequence, posture and position from [employer] Stairs: Yes Stairs assistance: Min guard Stair Management: One rail Right;One rail Left;Sideways;Step to pattern Number of Stairs: 3(x2) General stair comments: cues for seqence and safety   Wheelchair Mobility    Modified Rankin  (Stroke Patients Only)       Balance                                            Cognition Arousal/Alertness: Awake/alert Behavior During Therapy: WFL for tasks assessed/performed Overall Cognitive Status: Within Functional Limits for tasks assessed                                        Exercises Total Joint Exercises Ankle Circles/Pumps: AROM;Both;15 reps;Supine Quad Sets: AROM;Both;10 reps;Supine Heel Slides: (reviewed HEP for practice at home later today)    General Comments        Pertinent Vitals/Pain Pain Assessment: 0-10 Pain Score: 4  Pain Location: R knee Pain Descriptors / Indicators: Aching;Sore Pain Intervention(s): Limited activity within patient's tolerance;Monitored during session;Ice applied    Home Living                      Prior Function            PT Goals (current goals can now be found in the care plan section) Acute Rehab PT Goals Patient Stated Goal: become more independent. PT Goal Formulation: With patient Time For Goal Achievement: 12/07/17 Potential to Achieve Goals: Good Progress towards PT goals: Progressing toward goals    Frequency    7X/week      PT Plan Current plan remains appropriate    Co-evaluation  AM-PAC PT "6 Clicks" Daily Activity  Outcome Measure  Difficulty turning over in bed (including adjusting bedclothes, sheets and blankets)?: A Lot Difficulty moving from lying on back to sitting on the side of the bed? : A Little Difficulty sitting down on and standing up from a chair with arms (e.g., wheelchair, bedside commode, etc,.)?: A Little Help needed moving to and from a bed to chair (including a wheelchair)?: A Little Help needed walking in hospital room?: A Little Help needed climbing 3-5 steps with a railing? : A Little 6 Click Score: 17    End of Session Equipment Utilized During Treatment: Gait belt;Right knee immobilizer Activity  Tolerance: Patient tolerated treatment well Patient left: in bed;with call bell/phone within reach;with family/visitor present Nurse Communication: Mobility status PT Visit Diagnosis: Difficulty in walking, not elsewhere classified (R26.2)     Time: 3790-2409 PT Time Calculation (min) (ACUTE ONLY): 23 min  Charges:  $Gait Training: 23-37 mins                     Drucilla Chalet, PT  Pager: [phone] Acute Rehab Dept [facility]): [phone]   12/02/2017    [facility] 12/02/2017, 1:32 PM

## 2017-12-02 NOTE — Progress Notes (Signed)
   Subjective: 2 Days Post-Op Procedure(s) (LRB): RIGHT TOTAL KNEE ARTHROPLASTY (Right) Patient reports pain as mild and moderate.  Bigeminy and bradycardia. Appreciate hospitalist and cardiology help in his care.   Objective: Vital signs in last 24 hours: Temp:  [97.8 F (36.6 C)-98.8 F (37.1 C)] 98.8 F (37.1 C) (10/20 0549) Pulse Rate:  [32-80] 80 (10/20 0549) Resp:  [14-18] 18 (10/20 0549) BP: (102-115)/(64-79) 110/79 (10/20 0549) SpO2:  [95 %-100 %] 95 % (10/20 0549) Weight:  [97.4 kg] 97.4 kg (10/19 2300)  Intake/Output from previous day: 10/19 0701 - 10/20 0700 In: 1715.3 [P.O.:1200; I.V.:465.3; IV Piggyback:50] Out: 3950 [Urine:3950] Intake/Output this shift: Total I/O In: 600 [P.O.:600] Out: -   Recent Labs    12/01/17 0452 12/02/17 0501  HGB 10.3* 9.9*   Recent Labs    12/01/17 0452 12/02/17 0501  WBC 17.9* 12.8*  RBC 3.66* 3.54*  HCT 31.7* 30.5*  PLT 230 219   Recent Labs    12/01/17 2050 12/02/17 0501  NA 130* 135  K 4.0 4.3  CL 96* 99  CO2 26 26  BUN 18 18  CREATININE 1.09 1.00  GLUCOSE 191* 110*  CALCIUM 8.5* 8.9   Recent Labs    11/30/17 0607  INR 1.04    Neurologically intact No results found.  Assessment/Plan: 2 Days Post-Op Procedure(s) (LRB): RIGHT TOTAL KNEE ARTHROPLASTY (Right) Up with therapy. Stable for home per cardiology. Gets remote checks thru phone at home. Discharge home with HHPT.   Terry [name] 12/02/2017, 10:19 AM

## 2017-12-02 NOTE — Progress Notes (Addendum)
Have confirmed, patient is just going into Bigeminy with rate in the 70s but PVCs are not felt on taking his pulse peripherally.  Patient asymptomatic, just somewhat anxious.  Will reassure patient.  Have spoken with Dr. Drinda Butts who now recommends resuming coreg as beta blocker would be treatment of choice for the bigeminy.  Will give dose now, and schedule next dose today for 1300.  Magnesium also noted to be low at 1.6, will replace.  Update: spent past hour talking with patient at bedside.  He is reassured about the bigeminy and now understands what is going on with regards to his heart.  He does, however, express overall unhappiness in general with his care and missing heart medications thus far (ie not having Inspra available in pharmacy, etc).  Asked if there was anything else I could do for him tonight or further questions I could answer at this time, he said "no".  Also offered to see if we can get him in touch with a Patient Advocate in morning to better see how we can better address his concerns, he did express interest in speaking to one in the morning.  RN will let her charge nurse know so we can get one to see him in the morning.

## 2017-12-02 NOTE — Progress Notes (Signed)
Paged triad hospitalist: pt requesting ambien for insomnia with ventricular bigeminy. Hospitalist confirmed with Cardiology to resume coreg and okay to give ambien.

## 2017-12-04 ENCOUNTER — Encounter (HOSPITAL_COMMUNITY): Payer: Self-pay | Admitting: Orthopaedic Surgery

## 2017-12-04 ENCOUNTER — Telehealth (INDEPENDENT_AMBULATORY_CARE_PROVIDER_SITE_OTHER): Payer: Self-pay | Admitting: Orthopaedic Surgery

## 2017-12-04 NOTE — Telephone Encounter (Signed)
Verbal order given to Mary 

## 2017-12-04 NOTE — Telephone Encounter (Signed)
Corrie Dandy, PT at [facility] is requesting verbal orders for physical therapy. She is hoping to see patient today around 1:30 and then also 3 x for 2 weeks. She also wanted to let Dr. Magnus Ivan know that patient had an irregular heart rate yesterday but said this was not uncommon for him. Please call # [phone]

## 2017-12-05 NOTE — Discharge Summary (Signed)
Patient ID: Terry [name] MRN: [id number] DOB/AGE: May 23, 1955 62 y.o.  Admit date: 11/30/2017 Discharge date: 12/05/2017  Admission Diagnoses:  Principal Problem:   Unilateral primary osteoarthritis, right knee Active Problems:   Status post total knee replacement, right   Bradycardia with 31-40 beats per minute   Discharge Diagnoses:  Status post right total knee arthroplasty Asymptomatic bradycardia  Past Medical History:  Diagnosis Date  . AICD (automatic cardioverter/defibrillator) present   . Anemia    "S/P knee & hip OR"  . Aortic insufficiency    mild   . Aortic root dilatation (HCC)   . Atrial fibrillation (HCC)    "off and on since 2013" (08/05/2014)  . Chronic systolic CHF (congestive heart failure), NYHA class 1 (HCC)   . Complication of anesthesia    hx of irregular beat after anesthesia in ~ 1990's  . Dysrhythmia   . Ejection fraction < 50%    by echo 08/25/11   . GERD (gastroesophageal reflux disease)   . H/O hematuria   . H/O hiatal hernia   . 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 History of blood transfusion 2013; 2014   S/P knee and hip OR  . Hypertension   . 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 Rheumatoid arthritis (HCC) dx'd 1995    Surgeries: Procedure(s): RIGHT TOTAL KNEE ARTHROPLASTY on 11/30/2017   Consultants: Treatment Team:  Alton Revere, MD Rodolph Bong, MD  Discharged Condition: Improved  Hospital Course: Terry [name] is an 62 y.o. male who was admitted 11/30/2017 for operative treatment ofUnilateral primary osteoarthritis, right knee. Patient has severe unremitting pain that affects sleep, daily activities, and work/hobbies. After pre-op clearance the patient was taken to the operating room on 11/30/2017 and underwent  Procedure(s): RIGHT TOTAL KNEE ARTHROPLASTY.    Patient was given perioperative antibiotics:  Anti-infectives (From admission, onward)   Start     Dose/Rate Route Frequency  Ordered Stop   11/30/17 1300  ceFAZolin (ANCEF) IVPB 1 g/50 mL premix     1 g 100 mL/hr over 30 Minutes Intravenous Every 6 hours 11/30/17 1040 11/30/17 1837   11/30/17 0600  ceFAZolin (ANCEF) IVPB 2g/100 mL premix     2 g 200 mL/hr over 30 Minutes Intravenous On call to O.R. 11/30/17 0537 11/30/17 0725   11/30/17 0600  ceFAZolin (ANCEF) 3 g in dextrose 5 % 50 mL IVPB  Status:  Discontinued     3 g 100 mL/hr over 30 Minutes Intravenous On call to O.R. 11/30/17 6578 11/30/17 0544       Patient was given sequential compression devices, early ambulation, and chemoprophylaxis to prevent DVT.  Patient benefited maximally from hospital stay and there were no complications.  Consult was obtained due to patient's bradycardia he was deemed stable for discharge to home by cardiology as he is asymptomatic.  Recent vital signs: No data found.   Recent laboratory studies: No results for input(s): WBC, HGB, HCT, PLT, NA, K, CL, CO2, BUN, CREATININE, GLUCOSE, INR, CALCIUM in the last 72 hours.  Invalid input(s): PT, 2   Discharge Medications:   Allergies as of 12/02/2017      Reactions   Spironolactone    Gynecomastia   Sulfa Antibiotics    Thrush and hives      Medication List    STOP taking these medications   HYDROcodone-acetaminophen 5-325 MG tablet Commonly known as:  NORCO/VICODIN     TAKE these medications  carvedilol 25 MG tablet Commonly known as:  COREG Take 1 tablet (25 mg total) by mouth 2 (two) times daily.   dofetilide 250 MCG capsule Commonly known as:  TIKOSYN TAKE 1 CAPSULE (250 MCG TOTAL) BY MOUTH 2 (TWO) TIMES DAILY.   ENTRESTO 49-51 MG Generic drug:  sacubitril-valsartan TAKE 1 TABLET BY MOUTH TWICE A DAY   eplerenone 50 MG tablet Commonly known as:  INSPRA Take 1 tablet (50 mg total) by mouth daily.   furosemide 40 MG tablet Commonly known as:  LASIX Take 60 mg qod and then 40 mg qod What changed:    how much to take  how to take this  when to  take this  additional instructions   methocarbamol 500 MG tablet Commonly known as:  ROBAXIN Take 1 tablet (500 mg total) by mouth every 6 (six) hours as needed for muscle spasms.   oxyCODONE 5 MG immediate release tablet Commonly known as:  Oxy IR/ROXICODONE Take 1-2 tablets (5-10 mg total) by mouth every 4 (four) hours as needed for moderate pain (pain score 4-6).   rivaroxaban 20 MG Tabs tablet Commonly known as:  XARELTO Take 1 tablet (20 mg total) by mouth daily with supper.       Diagnostic Studies: Dg Knee Right Port  Result Date: 11/30/2017 CLINICAL DATA:  Status post RIGHT-sided total knee replacement. EXAM: PORTABLE RIGHT KNEE - 1-2 VIEW COMPARISON:  None. FINDINGS: RIGHT knee arthroplasty hardware appears intact and appropriately positioned. Osseous alignment is anatomic. Expected postsurgical changes within the surrounding soft tissues. IMPRESSION: Status post RIGHT knee arthroplasty. No evidence of surgical complicating feature. Electronically Signed   By: Bary Richard M.D.   On: 11/30/2017 09:33    Disposition:     Follow-up Information    [name], MD Follow up in 2 week(s).   Specialty:  Orthopedic Surgery Contact information: [address] [place] [address] [phone]        [facility] Follow up.   Specialty:  Home Health Services Why:  Home Health Physical Therapy-agency will contact you to arrange initial visit Contact information: [address] [place] [address] [phone]            Signed: Richardean Canal 12/05/2017, 8:33 AM

## 2017-12-10 ENCOUNTER — Ambulatory Visit (INDEPENDENT_AMBULATORY_CARE_PROVIDER_SITE_OTHER): Payer: BC Managed Care – PPO | Admitting: *Deleted

## 2017-12-10 DIAGNOSIS — I5022 Chronic systolic (congestive) heart failure: Secondary | ICD-10-CM | POA: Diagnosis not present

## 2017-12-10 DIAGNOSIS — I420 Dilated cardiomyopathy: Secondary | ICD-10-CM

## 2017-12-10 NOTE — Progress Notes (Signed)
Remote ICD transmission.   

## 2017-12-12 ENCOUNTER — Encounter (INDEPENDENT_AMBULATORY_CARE_PROVIDER_SITE_OTHER): Payer: Self-pay | Admitting: Orthopaedic Surgery

## 2017-12-12 ENCOUNTER — Other Ambulatory Visit (INDEPENDENT_AMBULATORY_CARE_PROVIDER_SITE_OTHER): Payer: Self-pay

## 2017-12-12 ENCOUNTER — Ambulatory Visit (INDEPENDENT_AMBULATORY_CARE_PROVIDER_SITE_OTHER): Payer: Medicare Other | Admitting: Orthopaedic Surgery

## 2017-12-12 DIAGNOSIS — Z96651 Presence of right artificial knee joint: Secondary | ICD-10-CM

## 2017-12-12 MED ORDER — OXYCODONE HCL 5 MG PO TABS: 5.0000 mg | ORAL_TABLET | ORAL | 0 refills | Status: DC | PRN

## 2017-12-12 NOTE — Progress Notes (Signed)
The patient is 2 weeks status post a right total knee arthroplasty.  He is doing well overall.  He is on Xarelto but he is on this chronically.  Notes from therapy say that he is already been able to flex his knee to 98 degrees.  Today his extension is almost full.  He has appropriate swelling of his knee and he can flex to past 90 degrees.  I did remove his old Steri-Strips in place new Steri-Strips.  There is no evidence of infection or breakdown.  His calf is soft.  He will continue home therapy and then transition outpatient physical therapy to work on his mobility as well as gait training and coordination.  They can work on range of motion and of his right knee and strengthening.  We will send in some more pain medication for him.  He should wait at least 2 more weeks before he drives.

## 2017-12-13 ENCOUNTER — Other Ambulatory Visit (INDEPENDENT_AMBULATORY_CARE_PROVIDER_SITE_OTHER): Payer: Self-pay | Admitting: Orthopaedic Surgery

## 2017-12-13 ENCOUNTER — Encounter (INDEPENDENT_AMBULATORY_CARE_PROVIDER_SITE_OTHER): Payer: Self-pay | Admitting: Orthopaedic Surgery

## 2017-12-13 DIAGNOSIS — M05722 Rheumatoid arthritis with rheumatoid factor of left elbow without organ or systems involvement: Secondary | ICD-10-CM | POA: Diagnosis not present

## 2017-12-13 MED ORDER — METHOCARBAMOL 500 MG PO TABS: 500.0000 mg | ORAL_TABLET | Freq: Four times a day (QID) | ORAL | 0 refills | Status: DC | PRN

## 2017-12-17 ENCOUNTER — Ambulatory Visit: Payer: BC Managed Care – PPO | Attending: Orthopaedic Surgery

## 2017-12-17 ENCOUNTER — Other Ambulatory Visit: Payer: Self-pay

## 2017-12-17 DIAGNOSIS — M25661 Stiffness of right knee, not elsewhere classified: Secondary | ICD-10-CM

## 2017-12-17 DIAGNOSIS — R262 Difficulty in walking, not elsewhere classified: Secondary | ICD-10-CM

## 2017-12-17 DIAGNOSIS — R600 Localized edema: Secondary | ICD-10-CM

## 2017-12-17 DIAGNOSIS — M25561 Pain in right knee: Secondary | ICD-10-CM

## 2017-12-17 NOTE — Therapy (Signed)
[facility] [address] [place], [place], [address] Phone: [phone]   Fax:  [phone]  Physical Therapy Evaluation  Patient Details  Name: Terry [name] MRN: [id number] Date of Birth: August 03, 1955 Referring Provider (PT): Doneen Poisson, MD   Encounter Date: 12/17/2017  PT End of Session - 12/17/17 1647    Visit Number  1    Number of Visits  24    Date for PT Re-Evaluation  03/09/18    Authorization Type  BCBS/ MCR    Authorization Time Period  progress on visit 10 and KX at visit 15    PT Start Time  0345    PT Stop Time  0430    PT Time Calculation (min)  45 min    Equipment Utilized During Treatment  Gait belt    Activity Tolerance  Patient tolerated treatment well;No increased pain    Behavior During Therapy  WFL for tasks assessed/performed       Past Medical History:  Diagnosis Date  . AICD (automatic cardioverter/defibrillator) present   . Anemia    "S/P knee & hip OR"  . Aortic insufficiency    mild   . Aortic root dilatation (HCC)   . Atrial fibrillation (HCC)    "off and on since 2013" (08/05/2014)  . Chronic systolic CHF (congestive heart failure), NYHA class 1 (HCC)   . Complication of anesthesia    hx of irregular beat after anesthesia in ~ 1990's  . Dysrhythmia   . Ejection fraction < 50%    by echo 08/25/11   . GERD (gastroesophageal reflux disease)   . H/O hematuria   . H/O hiatal hernia   . 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 History of blood transfusion 2013; 2014   S/P knee and hip OR  . Hypertension   . 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 Rheumatoid arthritis (HCC) dx'd 1995    Past Surgical History:  Procedure Laterality Date  . CARDIAC CATHETERIZATION  ~ 2004   normal  . CARDIAC CATHETERIZATION N/A 09/07/2014   Procedure: Left Heart Cath and Coronary Angiography;  Surgeon: Laurey Morale, MD;  Location: [facility] INVASIVE CV LAB;  Service:  Cardiovascular;  Laterality: N/A;  . CARDIAC DEFIBRILLATOR PLACEMENT  08/05/2014   St. Jude  . CARDIOVERSION N/A 08/21/2013   Procedure: CARDIOVERSION;  Surgeon: Quintella Reichert, MD;  Location: [facility] ENDOSCOPY;  Service: Cardiovascular;  Laterality: N/A;  . COLONOSCOPY  2010; 2015   "polyps; no polyps"  . EP IMPLANTABLE DEVICE N/A 08/05/2014   Procedure: ICD Implant;  Surgeon: [name], MD;  Location: [facility] INVASIVE CV LAB;  Service: Cardiovascular;  Laterality: N/A;  . 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 JOINT REPLACEMENT    . NASAL SEPTUM SURGERY  ~ 1975  . TONSILLECTOMY    . TOTAL HIP ARTHROPLASTY  09/08/2011   Procedure: TOTAL HIP ARTHROPLASTY ANTERIOR APPROACH;  Surgeon: [name], MD;  Location: [facility] ORS;  Service: Orthopedics;  Laterality: Right;  Right total hip replacement, left knee steroid injection  . TOTAL KNEE ARTHROPLASTY Left 11/29/2012   Procedure: LEFT TOTAL KNEE ARTHROPLASTY;  Surgeon: [name], MD;  Location: [facility] ORS;  Service: Orthopedics;  Laterality: Left;  FEMORAL NERVE BLOCK IN HOLDING AREA LEFT LEG  . TOTAL KNEE ARTHROPLASTY Right 11/30/2017   Procedure: RIGHT TOTAL KNEE ARTHROPLASTY;  Surgeon: [name], MD;  Location: [facility] ORS;  Service: Orthopedics;  Laterality: Right;  . URETHRAL FISTULA REPAIR  ~ 1996  . URETHRAL STRICTURE DILATATION  "several times"    There were no vitals filed for this visit.   Subjective Assessment - 12/17/17 1550    Subjective  RA casue wearing of joint and needed TKA . HAs RT hip and LT knee arthroplasty. Using RW but feels can get on a cane soon.      2 weeks HHPT. Finished last week    Patient is accompained by:  Family member    Limitations  Walking;Sitting   driving , bringing trash cans to curb , meal prep standing.        How long can you sit comfortably?  As needed with knee in good positon.     How long can you stand comfortably?  short times.     How long can you walk  comfortably?  1 block    Patient Stated Goals  He wants to walk 4-5 blocks and be off device. was walking a mile prior to surgery.  return to full independence.     Currently in Pain?  Yes    Pain Score  3     Pain Location  Knee    Pain Orientation  Right;Anterior    Pain Descriptors / Indicators  Dull    Pain Type  Surgical pain    Pain Onset  1 to 4 weeks ago    Pain Frequency  Constant    Aggravating Factors   exercise bending.     Pain Relieving Factors  stretch, medication         [facility] PT Assessment - 12/17/17 0001      Assessment   Medical Diagnosis  RT TKA    Referring Provider (PT)  Doneen Poisson, MD    Onset Date/Surgical Date  11/30/17    Next MD Visit  01/09/18    Prior Therapy  HHPT stopped  12/12/17      Precautions   Precaution Comments  No driving      Restrictions   Weight Bearing Restrictions  No      Balance Screen   Has the patient fallen in the past 6 months  No    Has the patient had a decrease in activity level because of a fear of falling?   Yes   due to surgery   Is the patient reluctant to leave their home because of a fear of falling?   No      Home Environment   Living Environment  Private residence    Living Arrangements  Spouse/significant other    Type of Home  House    Home Access  Stairs to enter    Entrance Stairs-Number of Steps  4    Entrance Stairs-Rails  Right;Left    Home Layout  Two level    Alternate Level Stairs-Number of Steps  12    Alternate Level Stairs-Rails  Right      Prior Function   Level of Independence  Needs assistance with homemaking;Needs assistance with ADLs    Vocation  Retired      IT consultant   Overall Cognitive Status  Within Functional Limits for tasks assessed      Observation/Other Assessments   Focus on Therapeutic Outcomes (FOTO)   50% limited      ROM / Strength   AROM / PROM / Strength  AROM;PROM;Strength      AROM   AROM Assessment  Site  Knee;Ankle    Right/Left Knee  Right;Left     Right Knee Extension  -23    Right Knee Flexion  101    Left Knee Extension  -8    Left Knee Flexion  125    Right Ankle Dorsiflexion  100    Right Ankle Plantar Flexion  20      PROM   PROM Assessment Site  Knee    Right/Left Knee  Right    Right Knee Extension  -18    Right Knee Flexion  103      Strength   Strength Assessment Site  Knee    Right/Left Knee  Left;Right    Right Knee Flexion  4+/5    Right Knee Extension  4+/5    Left Knee Flexion  5/5    Left Knee Extension  5/5      Flexibility   Soft Tissue Assessment /Muscle Length  yes      Ambulation/Gait   Gait Comments  RW WBAT RT decr ROm with swing and stance.    but was able to walk with SPC with min weight bearing safely and stairs with on rail step to pattern                Objective measurements completed on examination: See above findings.      [facility] Adult PT Treatment/Exercise - 12/17/17 0001      Exercises   Exercises  Knee/Hip      Knee/Hip Exercises: Standing   Other Standing Knee Exercises  standing reach RT arn with glut andquads squeeze for strength ad stability             PT Education - 12/17/17 1646    Education Details  POC ,HEP, also askedd hip to hold stretching longer 5-10 sec at home     Person(s) Educated  Patient;Spouse    Methods  Explanation;Demonstration;Tactile cues;Verbal cues;Handout    Comprehension  Verbalized understanding;Returned demonstration       PT Short Term Goals - 12/17/17 1542      PT SHORT TERM GOAL #1   Title  He will be independent with initial HEP     Time  3    Status  New      PT SHORT TERM GOAL #2   Title  He will walk with no device in and out of home    Time  4    Period  Weeks    Status  New      PT SHORT TERM GOAL #3   Title  He will report pain dcreased 50% or more with activiy on feet.     Time  4    Period  Weeks    Status  New      PT SHORT TERM GOAL #4   Title  Active Rt knee flexion to 120 degree s    Time  4     Period  Weeks    Status  New      PT SHORT TERM GOAL #5   Title  Active RT knee extension to -15 degrees    Time  4    Period  Weeks    Status  New        PT Long Term Goals - 12/17/17 1653      PT LONG TERM GOAL #1   Title  He will be indpendent with all HEp issued    Time  12    Period  Weeks    Status  New      PT LONG TERM GOAL #2   Title  He will walk  for all activity withnout device.     Time  12    Period  Weeks    Status  New      PT LONG TERM GOAL #3   Title  He will be able to walk stairs step over step with one rail.     Time  12    Period  Weeks    Status  New      PT LONG TERM GOAL #4   Title  He will report return to driving    Time  12    Period  Weeks    Status  New      PT LONG TERM GOAL #5   Title   He will report return to all normal home  tasks.     Time  12    Period  Weeks    Status  New      Additional Long Term Goals   Additional Long Term Goals  Yes      PT LONG TERM GOAL #6   Title  He will report pain as intermittant    Time  12    Period  Weeks    Status  New      PT LONG TERM GOAL #7   Title  FOTO score to decr to 35% limited to demo functional improvement    Time  12    Period  Weeks    Status  New             Plan - 12/17/17 1648    Clinical Impression Statement  Mr Yoos presents with RT TKA with decr ROM , pain , swelling, weakness limtiing tolerance for selfcare/home tasks and walking.   He does his HEP 3x/day and is progressing well . He will do well in PT and become independent as prior to replacement    History and Personal Factors relevant to plan of care:  RT THA  LT TKA. RA, Bialteral shoulder decr ROM and Lt elbow stiffness and pain.     Clinical Presentation  Evolving    Clinical Presentation due to:  post RT TkA    Clinical Decision Making  Moderate    Rehab Potential  Good    PT Frequency  2x / week    PT Duration  12 weeks    PT Treatment/Interventions  Cryotherapy;Vasopneumatic  Device;Therapeutic exercise;Patient/family education;Passive range of motion;Manual techniques;Functional mobility training;Stair training;Gait training    PT Next Visit Plan  LE strength and ROM.        vaso as needed           manual for decr swelling and incr ROM ,    edema  measure    PT Home Exercise Plan  stand wall reaching with glut/quad set     Consulted and Agree with Plan of Care  Patient       Patient will benefit from skilled therapeutic intervention in order to improve the following deficits and impairments:  Pain, Decreased activity tolerance, Decreased endurance, Decreased range of motion, Decreased strength, Difficulty walking, Increased edema  Visit Diagnosis: Acute pain of right knee  Stiffness of right knee, not elsewhere classified  Difficulty in walking, not elsewhere classified  Localized edema     Problem List Patient Active Problem List   Diagnosis Date Noted  . Bradycardia with  31-40 beats per minute 12/01/2017  . Status post total knee replacement, right 11/30/2017  . Unilateral primary osteoarthritis, right knee 10/17/2017  . S/P ICD (internal cardiac defibrillator) procedure 08/05/2014  . Nonischemic dilated cardiomyopathy (HCC)   . Hypertension   . Aortic insufficiency   . Persistent atrial fibrillation   . Chronic systolic heart failure (HCC)   . Aortic root dilatation (HCC)   . Urinary retention 12/01/2012  . Degenerative arthritis of left knee 11/29/2012  . Degenerative arthritis of hip 09/08/2011    Caprice Red PT 12/17/2017, 5:00 PM  [facility] [address] [place], [place], [address] Phone: [phone]   Fax:  [phone]  Name: Terry [name] MRN: [id number] Date of Birth: 06/01/1955

## 2017-12-17 NOTE — Patient Instructions (Signed)
Standing face wall reach with RT arm x 10 with glut and quad contraction 10 reps 23-x/day . Shoulder ROM  limited so pt cautioned to stop if he has pain.

## 2017-12-18 ENCOUNTER — Ambulatory Visit: Payer: BC Managed Care – PPO | Admitting: Physical Therapy

## 2017-12-18 ENCOUNTER — Encounter: Payer: Self-pay | Admitting: Physical Therapy

## 2017-12-18 DIAGNOSIS — M25661 Stiffness of right knee, not elsewhere classified: Secondary | ICD-10-CM

## 2017-12-18 DIAGNOSIS — R600 Localized edema: Secondary | ICD-10-CM

## 2017-12-18 DIAGNOSIS — M25561 Pain in right knee: Secondary | ICD-10-CM | POA: Diagnosis not present

## 2017-12-18 DIAGNOSIS — R262 Difficulty in walking, not elsewhere classified: Secondary | ICD-10-CM

## 2017-12-19 ENCOUNTER — Encounter: Payer: Self-pay | Admitting: Physical Therapy

## 2017-12-19 NOTE — Therapy (Signed)
[facility] Outpatient Rehabilitation [facility] [address] [place], [place], [address] Phone: [phone]   Fax:  [phone]  Physical Therapy Treatment  Patient Details  Name: Terry [name] MRN: [id number] Date of Birth: April 07, 1955 Referring Provider (PT): Doneen Poisson, MD   Encounter Date: 12/18/2017  PT End of Session - 12/19/17 0819    Visit Number  2    Number of Visits  24    Date for PT Re-Evaluation  03/09/18    Authorization Type  BCBS/ MCR    Authorization Time Period  progress on visit 10 and KX at visit 15    PT Start Time  1630    PT Stop Time  1712    PT Time Calculation (min)  42 min    Activity Tolerance  Patient tolerated treatment well;No increased pain    Behavior During Therapy  WFL for tasks assessed/performed       Past Medical History:  Diagnosis Date  . AICD (automatic cardioverter/defibrillator) present   . Anemia    "S/P knee & hip OR"  . Aortic insufficiency    mild   . Aortic root dilatation (HCC)   . Atrial fibrillation (HCC)    "off and on since 2013" (08/05/2014)  . Chronic systolic CHF (congestive heart failure), NYHA class 1 (HCC)   . Complication of anesthesia    hx of irregular beat after anesthesia in ~ 1990's  . Dysrhythmia   . Ejection fraction < 50%    by echo 08/25/11   . GERD (gastroesophageal reflux disease)   . H/O hematuria   . H/O hiatal hernia   . 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 History of blood transfusion 2013; 2014   S/P knee and hip OR  . Hypertension   . 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 Rheumatoid arthritis (HCC) dx'd 1995    Past Surgical History:  Procedure Laterality Date  . CARDIAC CATHETERIZATION  ~ 2004   normal  . CARDIAC CATHETERIZATION N/A 09/07/2014   Procedure: Left Heart Cath and Coronary Angiography;  Surgeon: Laurey Morale, MD;  Location: [facility] INVASIVE CV LAB;  Service: Cardiovascular;  Laterality: N/A;  . CARDIAC DEFIBRILLATOR  PLACEMENT  08/05/2014   St. Jude  . CARDIOVERSION N/A 08/21/2013   Procedure: CARDIOVERSION;  Surgeon: Quintella Reichert, MD;  Location: [facility] ENDOSCOPY;  Service: Cardiovascular;  Laterality: N/A;  . COLONOSCOPY  2010; 2015   "polyps; no polyps"  . EP IMPLANTABLE DEVICE N/A 08/05/2014   Procedure: ICD Implant;  Surgeon: [name], MD;  Location: [facility] INVASIVE CV LAB;  Service: Cardiovascular;  Laterality: N/A;  . 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 JOINT REPLACEMENT    . NASAL SEPTUM SURGERY  ~ 1975  . TONSILLECTOMY    . TOTAL HIP ARTHROPLASTY  09/08/2011   Procedure: TOTAL HIP ARTHROPLASTY ANTERIOR APPROACH;  Surgeon: [name], MD;  Location: [facility] ORS;  Service: Orthopedics;  Laterality: Right;  Right total hip replacement, left knee steroid injection  . TOTAL KNEE ARTHROPLASTY Left 11/29/2012   Procedure: LEFT TOTAL KNEE ARTHROPLASTY;  Surgeon: [name], MD;  Location: [facility] ORS;  Service: Orthopedics;  Laterality: Left;  FEMORAL NERVE BLOCK IN HOLDING AREA LEFT LEG  . TOTAL KNEE ARTHROPLASTY Right 11/30/2017   Procedure: RIGHT TOTAL KNEE ARTHROPLASTY;  Surgeon: [name], MD;  Location: [facility] ORS;  Service: Orthopedics;  Laterality: Right;  .  URETHRAL FISTULA REPAIR  ~ 1996  . URETHRAL STRICTURE DILATATION  "several times"    There were no vitals filed for this visit.  Subjective Assessment - 12/18/17 1632    Subjective  RA casue wearing of joint and needed TKA . HAs RT hip and LT knee arthroplasty. Using RW but feels can get on a cane soon.      2 weeks HHPT. Finished last week    Patient is accompained by:  Family member    Limitations  Walking;Sitting    How long can you sit comfortably?  As needed with knee in good positon.     How long can you stand comfortably?  short times.     How long can you walk comfortably?  1 block    Patient Stated Goals  He wants to walk 4-5 blocks and be off device. was walking a mile prior to  surgery.  return to full independence.     Currently in Pain?  Yes    Pain Score  2     Pain Location  Knee    Pain Orientation  Right;Anterior    Pain Descriptors / Indicators  Aching    Pain Type  Surgical pain    Pain Onset  1 to 4 weeks ago    Pain Frequency  Constant    Aggravating Factors   exercises bending     Pain Relieving Factors  stretch and medication     Multiple Pain Sites  No         [facility] PT Assessment - 12/19/17 0759      Assessment   Medical Diagnosis  RT TKA    Referring Provider (PT)  Doneen Poisson, MD    Onset Date/Surgical Date  11/30/17    Next MD Visit  01/09/18    Prior Therapy  HHPT stopped  12/12/17      Precautions   Precaution Comments  No driving      Restrictions   Weight Bearing Restrictions  No      Home Environment   Living Environment  Private residence    Living Arrangements  Spouse/significant other    Type of Home  House    Home Access  Stairs to enter    Entrance Stairs-Number of Steps  4    Entrance Stairs-Rails  Right;Left    Home Layout  Two level    Alternate Level Stairs-Number of Steps  12    Alternate Level Stairs-Rails  Right      Prior Function   Level of Independence  Needs assistance with homemaking;Needs assistance with ADLs    Vocation  Retired      IT consultant   Overall Cognitive Status  Within Functional Limits for tasks assessed      Observation/Other Assessments   Focus on Therapeutic Outcomes (FOTO)   50% limited      AROM   Right Knee Extension  -23    Right Knee Flexion  101    Left Knee Extension  -8    Left Knee Flexion  125    Right Ankle Dorsiflexion  100    Right Ankle Plantar Flexion  20      PROM   Right Knee Extension  -18    Right Knee Flexion  103      Strength   Right Knee Flexion  4+/5    Right Knee Extension  4+/5    Left Knee Flexion  5/5    Left Knee Extension  5/5      Flexibility   Soft Tissue Assessment /Muscle Length  yes      Ambulation/Gait   Gait Comments   RW WBAT RT decr ROm with swing and stance.    but was able to walk with [facility] with min weight bearing safely and stairs with on rail step to pattern                   [facility] Adult PT Treatment/Exercise - 12/19/17 0759      Knee/Hip Exercises: Standing   Heel Raises Limitations  x20     Hip Flexion Limitations  standing march 2x10 cuing to go slow     Terminal Knee Extension  2 sets;10 reps    Theraband Level (Terminal Knee Extension)  Level 3 (Green)    Terminal Knee Extension Limitations  cuing for technique     Lateral Step Up Limitations  2x10 4 inch     Forward Step Up Limitations  2x10 4 inch       Knee/Hip Exercises: Supine   Quad Sets Limitations  x15 5 sec hold     Short Arc Quad Sets Limitations  3x10 1lb     Straight Leg Raises Limitations  2x10 1lb       Manual Therapy   Manual Therapy  Passive ROM;Soft tissue mobilization    Joint Mobilization  4 way patellar mobs; PA and PA glides for flexion and extension     Soft tissue mobilization  hamstring release     Passive ROM  into extension and flexion              PT Education - 12/19/17 0819    Education Details  reviewed HEP; extension stretching     Person(s) Educated  Patient    Methods  Explanation;Demonstration;Tactile cues;Verbal cues    Comprehension  Verbalized understanding;Returned demonstration;Verbal cues required;Tactile cues required       PT Short Term Goals - 12/17/17 1542      PT SHORT TERM GOAL #1   Title  He will be independent with initial HEP     Time  3    Status  New      PT SHORT TERM GOAL #2   Title  He will walk with no device in and out of home    Time  4    Period  Weeks    Status  New      PT SHORT TERM GOAL #3   Title  He will report pain dcreased 50% or more with activiy on feet.     Time  4    Period  Weeks    Status  New      PT SHORT TERM GOAL #4   Title  Active Rt knee flexion to 120 degree s    Time  4    Period  Weeks    Status  New      PT SHORT  TERM GOAL #5   Title  Active RT knee extension to -15 degrees    Time  4    Period  Weeks    Status  New        PT Long Term Goals - 12/17/17 1653      PT LONG TERM GOAL #1   Title  He will be indpendent with all HEp issued    Time  12    Period  Weeks    Status  New  PT LONG TERM GOAL #2   Title  He will walk  for all activity withnout device.     Time  12    Period  Weeks    Status  New      PT LONG TERM GOAL #3   Title  He will be able to walk stairs step over step with one rail.     Time  12    Period  Weeks    Status  New      PT LONG TERM GOAL #4   Title  He will report return to driving    Time  12    Period  Weeks    Status  New      PT LONG TERM GOAL #5   Title   He will report return to all normal home  tasks.     Time  12    Period  Weeks    Status  New      Additional Long Term Goals   Additional Long Term Goals  Yes      PT LONG TERM GOAL #6   Title  He will report pain as intermittant    Time  12    Period  Weeks    Status  New      PT LONG TERM GOAL #7   Title  FOTO score to decr to 35% limited to demo functional improvement    Time  12    Period  Weeks    Status  New            Plan - 12/19/17 3295    Clinical Impression Statement  Patient tolerated treatment well. He had minor soreness with extension sttretching. He fatigued with SLR but therapy was able to add a weight. Per visual inpsection his range improved but not measured because his IE was yesterday. Therapy also added stairs.     Clinical Presentation  Evolving    Clinical Decision Making  Moderate    Rehab Potential  Good    PT Frequency  2x / week    PT Duration  12 weeks    PT Treatment/Interventions  Cryotherapy;Vasopneumatic Device;Therapeutic exercise;Patient/family education;Passive range of motion;Manual techniques;Functional mobility training;Stair training;Gait training    PT Next Visit Plan  LE strength and ROM.        vaso as needed           manual for  decr swelling and incr ROM ,    edema  measure; consider squats and leg press     PT Home Exercise Plan  stand wall reaching with glut/quad set     Consulted and Agree with Plan of Care  Patient       Patient will benefit from skilled therapeutic intervention in order to improve the following deficits and impairments:  Pain, Decreased activity tolerance, Decreased endurance, Decreased range of motion, Decreased strength, Difficulty walking, Increased edema  Visit Diagnosis: Acute pain of right knee  Stiffness of right knee, not elsewhere classified  Difficulty in walking, not elsewhere classified  Localized edema     Problem List Patient Active Problem List   Diagnosis Date Noted  . Bradycardia with 31-40 beats per minute 12/01/2017  . Status post total knee replacement, right 11/30/2017  . Unilateral primary osteoarthritis, right knee 10/17/2017  . S/P ICD (internal cardiac defibrillator) procedure 08/05/2014  . Nonischemic dilated cardiomyopathy (HCC)   . Hypertension   . Aortic insufficiency   . Persistent atrial fibrillation   .  Chronic systolic heart failure (HCC)   . Aortic root dilatation (HCC)   . Urinary retention 12/01/2012  . Degenerative arthritis of left knee 11/29/2012  . Degenerative arthritis of hip 09/08/2011    Dessie Coma PT DPT  12/19/2017, 8:28 AM   Donzetta Kohut SPT  12/19/2017   During this treatment session, the therapist was present, participating in and directing the treatment.   [facility] Outpatient Rehabilitation [facility] [address] [place], [place], [address] Phone: [phone]   Fax:  [phone]  Name: Terry [name] MRN: [id number] Date of Birth: [birth year]

## 2017-12-24 ENCOUNTER — Encounter: Payer: BC Managed Care – PPO | Admitting: Physical Therapy

## 2017-12-25 ENCOUNTER — Encounter

## 2017-12-26 ENCOUNTER — Ambulatory Visit: Payer: BC Managed Care – PPO

## 2017-12-26 DIAGNOSIS — M25561 Pain in right knee: Secondary | ICD-10-CM

## 2017-12-26 DIAGNOSIS — R262 Difficulty in walking, not elsewhere classified: Secondary | ICD-10-CM

## 2017-12-26 DIAGNOSIS — M25661 Stiffness of right knee, not elsewhere classified: Secondary | ICD-10-CM

## 2017-12-26 DIAGNOSIS — R600 Localized edema: Secondary | ICD-10-CM

## 2017-12-26 NOTE — Therapy (Signed)
[facility] Outpatient Rehabilitation [facility] [address] [place], [place], [address] Phone: [phone]   Fax:  [phone]  Physical Therapy Treatment  Patient Details  Name: Terry [name] MRN: [id number] Date of Birth: 1955/06/30 Referring Provider (PT): Doneen Poisson, MD   Encounter Date: 12/26/2017  PT End of Session - 12/26/17 1318    Visit Number  3    Number of Visits  24    Date for PT Re-Evaluation  03/09/18    PT Start Time  1100    PT Stop Time  1200    PT Time Calculation (min)  60 min    Activity Tolerance  Patient tolerated treatment well;No increased pain    Behavior During Therapy  WFL for tasks assessed/performed       Past Medical History:  Diagnosis Date  . AICD (automatic cardioverter/defibrillator) present   . Anemia    "S/P knee & hip OR"  . Aortic insufficiency    mild   . Aortic root dilatation (HCC)   . Atrial fibrillation (HCC)    "off and on since 2013" (08/05/2014)  . Chronic systolic CHF (congestive heart failure), NYHA class 1 (HCC)   . Complication of anesthesia    hx of irregular beat after anesthesia in ~ 1990's  . Dysrhythmia   . Ejection fraction < 50%    by echo 08/25/11   . GERD (gastroesophageal reflux disease)   . H/O hematuria   . H/O hiatal hernia   . 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 History of blood transfusion 2013; 2014   S/P knee and hip OR  . Hypertension   . 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 Rheumatoid arthritis (HCC) dx'd 1995    Past Surgical History:  Procedure Laterality Date  . CARDIAC CATHETERIZATION  ~ 2004   normal  . CARDIAC CATHETERIZATION N/A 09/07/2014   Procedure: Left Heart Cath and Coronary Angiography;  Surgeon: Laurey Morale, MD;  Location: [facility] INVASIVE CV LAB;  Service: Cardiovascular;  Laterality: N/A;  . CARDIAC DEFIBRILLATOR PLACEMENT  08/05/2014   St. Jude  . CARDIOVERSION N/A 08/21/2013   Procedure: CARDIOVERSION;  Surgeon:  Quintella Reichert, MD;  Location: [facility] ENDOSCOPY;  Service: Cardiovascular;  Laterality: N/A;  . COLONOSCOPY  2010; 2015   "polyps; no polyps"  . EP IMPLANTABLE DEVICE N/A 08/05/2014   Procedure: ICD Implant;  Surgeon: [name], MD;  Location: [facility] INVASIVE CV LAB;  Service: Cardiovascular;  Laterality: N/A;  . 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 JOINT REPLACEMENT    . NASAL SEPTUM SURGERY  ~ 1975  . TONSILLECTOMY    . TOTAL HIP ARTHROPLASTY  09/08/2011   Procedure: TOTAL HIP ARTHROPLASTY ANTERIOR APPROACH;  Surgeon: [name], MD;  Location: [facility];  Service: Orthopedics;  Laterality: Right;  Right total hip replacement, left knee steroid injection  . TOTAL KNEE ARTHROPLASTY Left 11/29/2012   Procedure: LEFT TOTAL KNEE ARTHROPLASTY;  Surgeon: [name], MD;  Location: [facility];  Service: Orthopedics;  Laterality: Left;  FEMORAL NERVE BLOCK IN HOLDING AREA LEFT LEG  . TOTAL KNEE ARTHROPLASTY Right 11/30/2017   Procedure: RIGHT TOTAL KNEE ARTHROPLASTY;  Surgeon: [name], MD;  Location: [facility];  Service: Orthopedics;  Laterality: Right;  . URETHRAL FISTULA REPAIR  ~ 1996  . URETHRAL STRICTURE DILATATION  "several times"    There were no vitals filed  for this visit.  Subjective Assessment - 12/26/17 1100    Subjective  Doing exercises three times a day, not using cane or walker since last week.  see doctor on 27th    How long can you sit comfortably?  no limit    How long can you stand comfortably?  20  min    How long can you walk comfortably?  3 blocks taking trash cans up with leaves    Pain Score  2     Pain Location  Knee    Pain Orientation  Right    Pain Descriptors / Indicators  Dull;Tightness    Pain Type  Surgical pain    Pain Frequency  Constant    Aggravating Factors   sitting     Pain Relieving Factors  movement         [facility] PT Assessment - 12/26/17 1109      Observation/Other Assessments   Observations   Edema measurements: Circumferential in cm: at tibial tubercle: R 37, L 36; at joint line R 42.5, L 41.5; at superior aspect of patella R 45.8, L 43.5      AROM   Right Knee Extension  -10    Right Knee Flexion  105                   [facility] Adult PT Treatment/Exercise - 12/26/17 1109      Ambulation/Gait   Gait Comments  no device with decreased stance time on R and flexed posture, increased BOS      Knee/Hip Exercises: Stretches   Passive Hamstring Stretch  Right;3 reps;30 seconds   with strap   Other Knee/Hip Stretches  into flexion in supine with strap x 10 5 sec hold      Knee/Hip Exercises: Aerobic   Stationary Bike  5 min no resist, full revolutions      Knee/Hip Exercises: Machines for Strengthening   Cybex Leg Press  2 x 10 with 2 plates, then 1 place with assist x 5 on R      Knee/Hip Exercises: Seated   Long Arc Quad  Strengthening;10 reps;Weights    Long Arc Quad Weight  2 lbs.    Hamstring Curl  Right;Strengthening;Other (comment)   green theraband   Sit to Sand  1 set;5 reps   min UE support, cues for foot position and even weight beari     Knee/Hip Exercises: Supine   Short Arc Control and instrumentation engineer Sets Limitations  10x 2#    Straight Leg Raises  Strengthening;10 reps    Straight Leg Raises Limitations  2#      Manual Therapy   Manual Therapy  Joint mobilization;Soft tissue mobilization    Joint Mobilization  patellar mobs and PA tibofemoral for extension    Soft tissue mobilization  hamstring release, STW into quads                PT Short Term Goals - 12/17/17 1542      PT SHORT TERM GOAL #1   Title  He will be independent with initial HEP     Time  3    Status  New      PT SHORT TERM GOAL #2   Title  He will walk with no device in and out of home    Time  4    Period  Weeks    Status  New  PT SHORT TERM GOAL #3   Title  He will report pain dcreased 50% or more with activiy on feet.     Time  4     Period  Weeks    Status  New      PT SHORT TERM GOAL #4   Title  Active Rt knee flexion to 120 degree s    Time  4    Period  Weeks    Status  New      PT SHORT TERM GOAL #5   Title  Active RT knee extension to -15 degrees    Time  4    Period  Weeks    Status  New        PT Long Term Goals - 12/17/17 1653      PT LONG TERM GOAL #1   Title  He will be indpendent with all HEp issued    Time  12    Period  Weeks    Status  New      PT LONG TERM GOAL #2   Title  He will walk  for all activity withnout device.     Time  12    Period  Weeks    Status  New      PT LONG TERM GOAL #3   Title  He will be able to walk stairs step over step with one rail.     Time  12    Period  Weeks    Status  New      PT LONG TERM GOAL #4   Title  He will report return to driving    Time  12    Period  Weeks    Status  New      PT LONG TERM GOAL #5   Title   He will report return to all normal home  tasks.     Time  12    Period  Weeks    Status  New      Additional Long Term Goals   Additional Long Term Goals  Yes      PT LONG TERM GOAL #6   Title  He will report pain as intermittant    Time  12    Period  Weeks    Status  New      PT LONG TERM GOAL #7   Title  FOTO score to decr to 35% limited to demo functional improvement    Time  12    Period  Weeks    Status  New            Plan - 12/26/17 1318    Clinical Impression Statement  Patient progressing with ROM this week along with ability to do full rotations on stationary bike and ambulating without assistive device.  He also tolerated sit to stand and leg press well, though obviously compensating functionally due to weakness on R evident with single leg on leg press.  Edema remains on R as compared to L.  Continue skilled PT to goals.    PT Next Visit Plan  LE strength functional, continue to progress ROM and gait, note edema changes    Consulted and Agree with Plan of Care  Patient       Patient will benefit  from skilled therapeutic intervention in order to improve the following deficits and impairments:     Visit Diagnosis: Acute pain of right knee  Stiffness of right knee, not elsewhere  classified  Difficulty in walking, not elsewhere classified  Localized edema     Problem List Patient Active Problem List   Diagnosis Date Noted  . Bradycardia with 31-40 beats per minute 12/01/2017  . Status post total knee replacement, right 11/30/2017  . Unilateral primary osteoarthritis, right knee 10/17/2017  . S/P ICD (internal cardiac defibrillator) procedure 08/05/2014  . Nonischemic dilated cardiomyopathy (HCC)   . Hypertension   . Aortic insufficiency   . Persistent atrial fibrillation   . Chronic systolic heart failure (HCC)   . Aortic root dilatation (HCC)   . Urinary retention 12/01/2012  . Degenerative arthritis of left knee 11/29/2012  . Degenerative arthritis of hip 09/08/2011    Elray Mcgregor, PT 12/26/2017, 1:28 PM  [facility] [address] [place], [place], [address] Phone: [phone]   Fax:  [phone]  Name: LAVAL AAGAARD MRN: [id number] Date of Birth: 1955-07-25

## 2017-12-28 ENCOUNTER — Ambulatory Visit: Payer: BC Managed Care – PPO | Admitting: Physical Therapy

## 2017-12-28 ENCOUNTER — Encounter: Payer: Self-pay | Admitting: Physical Therapy

## 2017-12-28 DIAGNOSIS — M25561 Pain in right knee: Secondary | ICD-10-CM | POA: Diagnosis not present

## 2017-12-28 DIAGNOSIS — R262 Difficulty in walking, not elsewhere classified: Secondary | ICD-10-CM

## 2017-12-28 DIAGNOSIS — R600 Localized edema: Secondary | ICD-10-CM

## 2017-12-28 DIAGNOSIS — M25661 Stiffness of right knee, not elsewhere classified: Secondary | ICD-10-CM

## 2017-12-31 ENCOUNTER — Encounter: Payer: Self-pay | Admitting: Physical Therapy

## 2017-12-31 NOTE — Therapy (Signed)
[facility] [address] [place], [place], [address] Phone: [phone]   Fax:  [phone]  Physical Therapy Treatment  Patient Details  Name: Terry [name] MRN: [id number] Date of Birth: 1955/10/21 Referring Provider (PT): Doneen Poisson, MD   Encounter Date: 12/28/2017  PT End of Session - 12/31/17 0735    Visit Number  4    Number of Visits  24    Date for PT Re-Evaluation  03/09/18    Authorization Type  BCBS/ MCR    Authorization Time Period  progress on visit 10 and KX at visit 15    PT Start Time  1145    PT Stop Time  1245    PT Time Calculation (min)  60 min    Activity Tolerance  Patient tolerated treatment well;No increased pain    Behavior During Therapy  WFL for tasks assessed/performed       Past Medical History:  Diagnosis Date  . AICD (automatic cardioverter/defibrillator) present   . Anemia    "S/P knee & hip OR"  . Aortic insufficiency    mild   . Aortic root dilatation (HCC)   . Atrial fibrillation (HCC)    "off and on since 2013" (08/05/2014)  . Chronic systolic CHF (congestive heart failure), NYHA class 1 (HCC)   . Complication of anesthesia    hx of irregular beat after anesthesia in ~ 1990's  . Dysrhythmia   . Ejection fraction < 50%    by echo 08/25/11   . GERD (gastroesophageal reflux disease)   . H/O hematuria   . H/O hiatal hernia   . 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 History of blood transfusion 2013; 2014   S/P knee and hip OR  . Hypertension   . 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 Rheumatoid arthritis (HCC) dx'd 1995    Past Surgical History:  Procedure Laterality Date  . CARDIAC CATHETERIZATION  ~ 2004   normal  . CARDIAC CATHETERIZATION N/A 09/07/2014   Procedure: Left Heart Cath and Coronary Angiography;  Surgeon: Laurey Morale, MD;  Location: [facility] INVASIVE CV LAB;  Service: Cardiovascular;  Laterality: N/A;  . CARDIAC DEFIBRILLATOR  PLACEMENT  08/05/2014   St. Jude  . CARDIOVERSION N/A 08/21/2013   Procedure: CARDIOVERSION;  Surgeon: Quintella Reichert, MD;  Location: [facility] ENDOSCOPY;  Service: Cardiovascular;  Laterality: N/A;  . COLONOSCOPY  2010; 2015   "polyps; no polyps"  . EP IMPLANTABLE DEVICE N/A 08/05/2014   Procedure: ICD Implant;  Surgeon: [name], MD;  Location: [facility] INVASIVE CV LAB;  Service: Cardiovascular;  Laterality: N/A;  . 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 JOINT REPLACEMENT    . NASAL SEPTUM SURGERY  ~ 1975  . TONSILLECTOMY    . TOTAL HIP ARTHROPLASTY  09/08/2011   Procedure: TOTAL HIP ARTHROPLASTY ANTERIOR APPROACH;  Surgeon: [name], MD;  Location: [facility] ORS;  Service: Orthopedics;  Laterality: Right;  Right total hip replacement, left knee steroid injection  . TOTAL KNEE ARTHROPLASTY Left 11/29/2012   Procedure: LEFT TOTAL KNEE ARTHROPLASTY;  Surgeon: [name], MD;  Location: [facility] ORS;  Service: Orthopedics;  Laterality: Left;  FEMORAL NERVE BLOCK IN HOLDING AREA LEFT LEG  . TOTAL KNEE ARTHROPLASTY Right 11/30/2017   Procedure: RIGHT TOTAL KNEE ARTHROPLASTY;  Surgeon: [name], MD;  Location: [facility] ORS;  Service: Orthopedics;  Laterality: Right;  .  URETHRAL FISTULA REPAIR  ~ 1996  . URETHRAL STRICTURE DILATATION  "several times"    There were no vitals filed for this visit.  Subjective Assessment - 12/31/17 0902    Subjective  Able to sleep on back all night now, but still waking up throughout with some pain. Pain getting better overall. May begin to start taking medications at night. Plans to blow leaves over the weekend.     Limitations  Walking;Sitting    How long can you sit comfortably?  no limit    How long can you stand comfortably?  20  min    How long can you walk comfortably?  3 blocks taking trash cans up with leaves    Patient Stated Goals  He wants to walk 4-5 blocks and be off device. was walking a mile prior to surgery.   return to full independence.     Currently in Pain?  Yes    Pain Score  1     Pain Location  Knee    Pain Orientation  Right    Pain Descriptors / Indicators  Aching    Pain Onset  1 to 4 weeks ago    Pain Frequency  Constant    Aggravating Factors   sitting     Pain Relieving Factors  movement     Multiple Pain Sites  No         [facility] PT Assessment - 12/31/17 0001      AROM   Right Knee Extension  -9    Right Knee Flexion  105                   [facility] Adult PT Treatment/Exercise - 12/31/17 0001      Neuro Re-ed    Neuro Re-ed Details   Narrow base of support x30sec; Single leg stance 3x30sec on air ex pad with min guard assist ;       Lumbar Exercises: Aerobic   Nustep  5 min L3      Knee/Hip Exercises: Stretches   Other Knee/Hip Stretches  seated hamstring stretch with AP pressure above distal quad      Knee/Hip Exercises: Standing   Heel Raises Limitations  x20     Terminal Knee Extension  2 sets;10 reps    Theraband Level (Terminal Knee Extension)  Level 3 (Green)    Lateral Step Up Limitations  2x10 4"    Forward Step Up Limitations  2x10 4 inch     Other Standing Knee Exercises  wall slides x10 cues for position      Knee/Hip Exercises: Seated   Long Arc Quad  1 set;10 reps    Long Arc Quad Weight  3 lbs.    Hamstring Curl  Right;Strengthening;Other (comment);10 reps      Knee/Hip Exercises: Supine   Short Arc Quad Sets Limitations  1x10 2#; 1x10 3#    Straight Leg Raises Limitations  x10 2#    Patellar Mobs  4 way    instructed pt      Manual Therapy   Joint Mobilization  patellar mobs and PA tibofemoral for extension    Soft tissue mobilization  distal quads and hamstring    Passive ROM  into extension and flexion              PT Education - 12/31/17 0736    Education Details  Reviewed HEP; symptom management     Person(s) Educated  Patient    Methods  Explanation;Demonstration;Tactile cues;Verbal cues    Comprehension   Verbalized understanding;Returned demonstration;Verbal cues required       PT Short Term Goals - 12/31/17 0901      PT SHORT TERM GOAL #1   Title  He will be independent with initial HEP     Time  3    Period  Weeks    Status  On-going      PT SHORT TERM GOAL #2   Title  He will walk with no device in and out of home    Baseline  Ambulating without AD at appointment     Time  4    Period  Weeks    Status  On-going      PT SHORT TERM GOAL #3   Title  He will report pain dcreased 50% or more with activiy on feet.     Time  4    Period  Weeks    Status  On-going      PT SHORT TERM GOAL #4   Title  Active Rt knee flexion to 120 degree s    Baseline  105    Time  4    Period  Weeks    Status  On-going      PT SHORT TERM GOAL #5   Title  Active RT knee extension to -15 degrees    Baseline  -9    Time  4    Period  Weeks    Status  On-going        PT Long Term Goals - 12/17/17 1653      PT LONG TERM GOAL #1   Title  He will be indpendent with all HEp issued    Time  12    Period  Weeks    Status  New      PT LONG TERM GOAL #2   Title  He will walk  for all activity withnout device.     Time  12    Period  Weeks    Status  New      PT LONG TERM GOAL #3   Title  He will be able to walk stairs step over step with one rail.     Time  12    Period  Weeks    Status  New      PT LONG TERM GOAL #4   Title  He will report return to driving    Time  12    Period  Weeks    Status  New      PT LONG TERM GOAL #5   Title   He will report return to all normal home  tasks.     Time  12    Period  Weeks    Status  New      Additional Long Term Goals   Additional Long Term Goals  Yes      PT LONG TERM GOAL #6   Title  He will report pain as intermittant    Time  12    Period  Weeks    Status  New      PT LONG TERM GOAL #7   Title  FOTO score to decr to 35% limited to demo functional improvement    Time  12    Period  Weeks    Status  New             Plan - 12/31/17 0902    Clinical Impression  Statement  Patient tolerated strengthening progressions well. Continues to lack 9 deg extension and was shown a seated hamstring stretch to improve knee extension and instructed in self patella mobilizations. Pt progressed into single leg stance on air ex pad with min guard assist required without use of hands for support.     Rehab Potential  Good    PT Frequency  2x / week    PT Duration  12 weeks    PT Treatment/Interventions  Cryotherapy;Vasopneumatic Device;Therapeutic exercise;Patient/family education;Passive range of motion;Manual techniques;Functional mobility training;Stair training;Gait training    PT Next Visit Plan  LE strength functional, continue to progress ROM and gait, note edema changes; single leg stance activities     PT Home Exercise Plan  stand wall reaching with glut/quad set     Consulted and Agree with Plan of Care  Patient       Patient will benefit from skilled therapeutic intervention in order to improve the following deficits and impairments:  Pain, Decreased activity tolerance, Decreased endurance, Decreased range of motion, Decreased strength, Difficulty walking, Increased edema  Visit Diagnosis: Acute pain of right knee  Stiffness of right knee, not elsewhere classified  Difficulty in walking, not elsewhere classified  Localized edema     Problem List Patient Active Problem List   Diagnosis Date Noted  . Bradycardia with 31-40 beats per minute 12/01/2017  . Status post total knee replacement, right 11/30/2017  . Unilateral primary osteoarthritis, right knee 10/17/2017  . S/P ICD (internal cardiac defibrillator) procedure 08/05/2014  . Nonischemic dilated cardiomyopathy (HCC)   . Hypertension   . Aortic insufficiency   . Persistent atrial fibrillation   . Chronic systolic heart failure (HCC)   . Aortic root dilatation (HCC)   . Urinary retention 12/01/2012  . Degenerative arthritis of  left knee 11/29/2012  . Degenerative arthritis of hip 09/08/2011    Dessie Coma PT DPT  12/31/2017, 9:03 AM   Donzetta Kohut PT DPT  12/31/2017   During this treatment session, the therapist was present, participating in and directing the treatment.   [facility] [address] [place], [place], [address] Phone: [phone]   Fax:  [phone]  Name: MYCHAL SARAZIN MRN: [id number] Date of Birth: 03-09-1955

## 2018-01-01 ENCOUNTER — Encounter: Payer: Self-pay | Admitting: Physical Therapy

## 2018-01-01 ENCOUNTER — Ambulatory Visit: Payer: BC Managed Care – PPO | Admitting: Physical Therapy

## 2018-01-01 DIAGNOSIS — R262 Difficulty in walking, not elsewhere classified: Secondary | ICD-10-CM

## 2018-01-01 DIAGNOSIS — R600 Localized edema: Secondary | ICD-10-CM

## 2018-01-01 DIAGNOSIS — M25561 Pain in right knee: Secondary | ICD-10-CM | POA: Diagnosis not present

## 2018-01-01 DIAGNOSIS — M25661 Stiffness of right knee, not elsewhere classified: Secondary | ICD-10-CM

## 2018-01-02 ENCOUNTER — Encounter: Payer: Self-pay | Admitting: Physical Therapy

## 2018-01-02 NOTE — Therapy (Addendum)
[place], [place], [address] Phone: [phone]   Fax:  [phone]  Physical Therapy Treatment  Patient Details  Name: Terry [name] MRN: [id number] Date of Birth: 1955-04-13 Referring Provider (PT): Jean Rosenthal, MD   Encounter Date: 01/01/2018  PT End of Session - 01/02/18 0749    Visit Number  5    Number of Visits  24    Date for PT Re-Evaluation  03/09/18    Authorization Type  BCBS/ MCR    Authorization Time Period  progress on visit 10 and KX at visit 15    PT Start Time  1630    PT Stop Time  1730    PT Time Calculation (min)  60 min    Activity Tolerance  Patient tolerated treatment well;No increased pain    Behavior During Therapy  WFL for tasks assessed/performed       Past Medical History:  Diagnosis Date  . AICD (automatic cardioverter/defibrillator) present   . Anemia    "S/P knee & hip OR"  . Aortic insufficiency    mild   . Aortic root dilatation ([place])   . Atrial fibrillation ([place])    "off and on since 2013" (08/05/2014)  . Chronic systolic CHF (congestive heart failure), NYHA class 1 ([place])   . Complication of anesthesia    hx of irregular beat after anesthesia in ~ 1990's  . Dysrhythmia   . Ejection fraction < 50%    by echo 08/25/11   . GERD (gastroesophageal reflux disease)   . H/O hematuria   . H/O hiatal hernia   . 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 History of blood transfusion 2013; 2014   S/P knee and hip OR  . Hypertension   . 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 Rheumatoid arthritis ([place]) dx'd 1995    Past Surgical History:  Procedure Laterality Date  . CARDIAC CATHETERIZATION  ~ 2004   normal  . CARDIAC CATHETERIZATION N/A 09/07/2014   Procedure: Left Heart Cath and Coronary Angiography;  Surgeon: Larey Dresser, MD;  Location: [place] CV LAB;  Service: Cardiovascular;  Laterality: N/A;  . CARDIAC DEFIBRILLATOR  PLACEMENT  08/05/2014   St. Jude  . CARDIOVERSION N/A 08/21/2013   Procedure: CARDIOVERSION;  Surgeon: Sueanne Margarita, MD;  Location: [facility] ENDOSCOPY;  Service: Cardiovascular;  Laterality: N/A;  . COLONOSCOPY  2010; 2015   "polyps; no polyps"  . EP IMPLANTABLE DEVICE N/A 08/05/2014   Procedure: ICD Implant;  Surgeon: Evans Lance, MD;  Location: [place] CV LAB;  Service: Cardiovascular;  Laterality: N/A;  . 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 JOINT REPLACEMENT    . NASAL SEPTUM SURGERY  ~ 1975  . TONSILLECTOMY    . TOTAL HIP ARTHROPLASTY  09/08/2011   Procedure: TOTAL HIP ARTHROPLASTY ANTERIOR APPROACH;  Surgeon: Mcarthur Rossetti, MD;  Location: [facility] ORS;  Service: Orthopedics;  Laterality: Right;  Right total hip replacement, left knee steroid injection  . TOTAL KNEE ARTHROPLASTY Left 11/29/2012   Procedure: LEFT TOTAL KNEE ARTHROPLASTY;  Surgeon: Mcarthur Rossetti, MD;  Location: [facility] ORS;  Service: Orthopedics;  Laterality: Left;  FEMORAL NERVE BLOCK IN HOLDING AREA LEFT LEG  . TOTAL KNEE ARTHROPLASTY Right 11/30/2017   Procedure: RIGHT TOTAL KNEE ARTHROPLASTY;  Surgeon: Mcarthur Rossetti, MD;  Location: [facility];  Service: Orthopedics;  Laterality: Right;  .  URETHRAL FISTULA REPAIR  ~ 1996  . URETHRAL STRICTURE DILATATION  "several times"    There were no vitals filed for this visit.  Subjective Assessment - 01/01/18 1636    Subjective  Pt did a lot of leaf blowing over the weekend and went shopping. Did over 4000 steps in all on Saturday without any increase in pain. Reports more stiffness on Sunday than anything. May wean off pain medication over the week and just take muscle relaxer as needed.     Limitations  Walking;Sitting    How long can you sit comfortably?  no limit    How long can you stand comfortably?  20  min    How long can you walk comfortably?  3 blocks taking trash cans up with leaves    Patient Stated Goals  He wants to walk 4-5  blocks and be off device. was walking a mile prior to surgery.  return to full independence.     Currently in Pain?  Yes    Pain Score  1     Pain Location  Knee    Pain Orientation  Right    Pain Descriptors / Indicators  --   tight   Pain Type  Surgical pain    Pain Onset  1 to 4 weeks ago    Pain Frequency  Constant    Aggravating Factors   sitting    Pain Relieving Factors  movement     Multiple Pain Sites  No                       [facility] Adult PT Treatment/Exercise - 01/02/18 0001      Neuro Re-ed    Neuro Re-ed Details   SLS on air ex pad 3x3O sec, pt able to hold 10 sec without finger tip holds and CGA; SLS on level surface with ball circles 2x10 and bwd/fwd 2x10 Bil without hand held assist      Lumbar Exercises: Aerobic   Nustep  5 min L5      Knee/Hip Exercises: Stretches   Passive Hamstring Stretch  2 reps;20 seconds    Other Knee/Hip Stretches  gastroc stretch standing 3x30sec      Knee/Hip Exercises: Standing   Hip Flexion Limitations  2x10 with 3#     Forward Step Up Limitations  1x15 6" inch step     Other Standing Knee Exercises  hip abduction 2x10 2# RLE; hip extension 2x10 2#; hamstring curls 1x10 2# 1x10 3# ; heel raise and toe raises on wobble board 2x10; lateral weight shifts on blue wobble board 2x10       Knee/Hip Exercises: Supine   Short Arc Quad Sets Limitations  2x10 3#     Straight Leg Raises Limitations  x10 2#, x5 3# discontinued due to fatigue and compensations    Patellar Mobs  4 way       Manual Therapy   Manual Therapy  Joint mobilization;Soft tissue mobilization    Manual therapy comments  IASTM to distal quads and IT band; discontinued after 5 minutes secondary to discomfort    Joint Mobilization  patellar mobs and PA tibofemoral for extension    Soft tissue mobilization  distal quads    Passive ROM  into extension and flexion              PT Education - 01/02/18 0749    Education Details  Reviewed HEP including  patellar mobilizations and stretches  Person(s) Educated  Patient    Methods  Explanation;Demonstration;Tactile cues;Verbal cues    Comprehension  Verbalized understanding;Returned demonstration       PT Short Term Goals - 12/31/17 0901      PT SHORT TERM GOAL #1   Title  He will be independent with initial HEP     Time  3    Period  Weeks    Status  On-going      PT SHORT TERM GOAL #2   Title  He will walk with no device in and out of home    Baseline  Ambulating without AD at appointment     Time  4    Period  Weeks    Status  On-going      PT SHORT TERM GOAL #3   Title  He will report pain dcreased 50% or more with activiy on feet.     Time  4    Period  Weeks    Status  On-going      PT SHORT TERM GOAL #4   Title  Active Rt knee flexion to 120 degree s    Baseline  105    Time  4    Period  Weeks    Status  On-going      PT SHORT TERM GOAL #5   Title  Active RT knee extension to -15 degrees    Baseline  -9    Time  4    Period  Weeks    Status  On-going        PT Long Term Goals - 01/02/18 0805      PT LONG TERM GOAL #1   Title  He will be indpendent with all HEp issued    Time  12    Period  Weeks    Status  On-going      PT LONG TERM GOAL #2   Title  He will walk  for all activity withnout device.     Baseline  Not currently using AD    Time  12    Period  Weeks    Status  Partially Met      PT LONG TERM GOAL #3   Title  He will be able to walk stairs step over step with one rail.     Baseline  still utilizin rail in community     Time  12    Period  Weeks    Status  On-going      PT LONG TERM GOAL #4   Title  He will report return to driving    Time  12    Period  Weeks    Status  On-going      PT LONG TERM GOAL #5   Title   He will report return to all normal home  tasks.     Time  12    Period  Weeks    Status  On-going      PT LONG TERM GOAL #6   Title  He will report pain as intermittant    Time  12    Period  Weeks     Status  On-going      PT LONG TERM GOAL #7   Title  FOTO score to decr to 35% limited to demo functional improvement    Time  12    Period  Weeks    Status  On-going  Plan - 01/02/18 0750    Clinical Impression Statement  Pt tolerated treatment well. Focus of therapy on progressing LE strengthening, dynamic balancee, and proprioceptive training. Pt demonstrates good voluntary quad control even though lacking 8 degrees extension.     History and Personal Factors relevant to plan of care:  Rt THA, LT TKA, RA, Bil shoulder decr ROM and LE elbow stiffness and pain     Clinical Presentation  Evolving    Clinical Presentation due to:  post RT TKA    Clinical Decision Making  Moderate    Rehab Potential  Good    PT Frequency  2x / week    PT Duration  12 weeks    PT Treatment/Interventions  Cryotherapy;Vasopneumatic Device;Therapeutic exercise;Patient/family education;Passive range of motion;Manual techniques;Functional mobility training;Stair training;Gait training    PT Next Visit Plan  LE strength functional, continue to progress ROM and gait, note edema changes; single leg stance activities, partial lunges, leg presses, STAR drills     PT Home Exercise Plan  stand wall reaching with glut/quad set; gastroc stretch     Consulted and Agree with Plan of Care  Patient       Patient will benefit from skilled therapeutic intervention in order to improve the following deficits and impairments:  Pain, Decreased activity tolerance, Decreased endurance, Decreased range of motion, Decreased strength, Difficulty walking, Increased edema  Visit Diagnosis: Acute pain of right knee  Stiffness of right knee, not elsewhere classified  Difficulty in walking, not elsewhere classified  Localized edema     Problem List Patient Active Problem List   Diagnosis Date Noted  . Bradycardia with 31-40 beats per minute 12/01/2017  . Status post total knee replacement, right 11/30/2017  .  Unilateral primary osteoarthritis, right knee 10/17/2017  . S/P ICD (internal cardiac defibrillator) procedure 08/05/2014  . Nonischemic dilated cardiomyopathy ([place])   . Hypertension   . Aortic insufficiency   . Persistent atrial fibrillation   . Chronic systolic heart failure ([place])   . Aortic root dilatation ([place])   . Urinary retention 12/01/2012  . Degenerative arthritis of left knee 11/29/2012  . Degenerative arthritis of hip 09/08/2011     Carolyne Littles DPT  01/02/2018   Einar Crow SPT 01/02/2018, 8:08 AM   During this treatment session, the therapist was present, participating in and directing the treatment.  [place], [place], [address] Phone: [phone]   Fax:  [phone]  Name: Terry [name] MRN: [id number] Date of Birth: [birth year]

## 2018-01-03 ENCOUNTER — Ambulatory Visit: Payer: BC Managed Care – PPO | Admitting: Physical Therapy

## 2018-01-03 DIAGNOSIS — M25561 Pain in right knee: Secondary | ICD-10-CM

## 2018-01-03 DIAGNOSIS — R600 Localized edema: Secondary | ICD-10-CM

## 2018-01-03 DIAGNOSIS — M25661 Stiffness of right knee, not elsewhere classified: Secondary | ICD-10-CM

## 2018-01-03 DIAGNOSIS — R262 Difficulty in walking, not elsewhere classified: Secondary | ICD-10-CM

## 2018-01-04 ENCOUNTER — Encounter: Payer: Self-pay | Admitting: Physical Therapy

## 2018-01-04 NOTE — Therapy (Signed)
[place], [place], [address] Phone: [phone]   Fax:  [phone]  Physical Therapy Treatment  Patient Details  Name: Terry [name] MRN: [id number] Date of Birth: 1955-10-07 Referring Provider (PT): Jean Rosenthal, MD   Encounter Date: 01/03/2018  PT End of Session - 01/04/18 0742    Visit Number  6    Number of Visits  24    Date for PT Re-Evaluation  03/09/18    Authorization Type  BCBS/ MCR    Authorization Time Period  progress on visit 10 and KX at visit 15    PT Start Time  1630    PT Stop Time  1730    PT Time Calculation (min)  60 min    Activity Tolerance  Patient tolerated treatment well;No increased pain    Behavior During Therapy  WFL for tasks assessed/performed       Past Medical History:  Diagnosis Date  . AICD (automatic cardioverter/defibrillator) present   . Anemia    "S/P knee & hip OR"  . Aortic insufficiency    mild   . Aortic root dilatation ([place])   . Atrial fibrillation ([place])    "off and on since 2013" (08/05/2014)  . Chronic systolic CHF (congestive heart failure), NYHA class 1 ([place])   . Complication of anesthesia    hx of irregular beat after anesthesia in ~ 1990's  . Dysrhythmia   . Ejection fraction < 50%    by echo 08/25/11   . GERD (gastroesophageal reflux disease)   . H/O hematuria   . H/O hiatal hernia   . 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 History of blood transfusion 2013; 2014   S/P knee and hip OR  . Hypertension   . 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 Rheumatoid arthritis ([place]) dx'd 1995    Past Surgical History:  Procedure Laterality Date  . CARDIAC CATHETERIZATION  ~ 2004   normal  . CARDIAC CATHETERIZATION N/A 09/07/2014   Procedure: Left Heart Cath and Coronary Angiography;  Surgeon: Larey Dresser, MD;  Location: [facility];  Service: Cardiovascular;  Laterality: N/A;  . CARDIAC DEFIBRILLATOR  PLACEMENT  08/05/2014   St. Jude  . CARDIOVERSION N/A 08/21/2013   Procedure: CARDIOVERSION;  Surgeon: Sueanne Margarita, MD;  Location: [facility];  Service: Cardiovascular;  Laterality: N/A;  . COLONOSCOPY  2010; 2015   "polyps; no polyps"  . EP IMPLANTABLE DEVICE N/A 08/05/2014   Procedure: ICD Implant;  Surgeon: Evans Lance, MD;  Location: [facility];  Service: Cardiovascular;  Laterality: N/A;  . 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 JOINT REPLACEMENT    . NASAL SEPTUM SURGERY  ~ 1975  . TONSILLECTOMY    . TOTAL HIP ARTHROPLASTY  09/08/2011   Procedure: TOTAL HIP ARTHROPLASTY ANTERIOR APPROACH;  Surgeon: Mcarthur Rossetti, MD;  Location: [facility];  Service: Orthopedics;  Laterality: Right;  Right total hip replacement, left knee steroid injection  . TOTAL KNEE ARTHROPLASTY Left 11/29/2012   Procedure: LEFT TOTAL KNEE ARTHROPLASTY;  Surgeon: Mcarthur Rossetti, MD;  Location: [facility];  Service: Orthopedics;  Laterality: Left;  FEMORAL NERVE BLOCK IN HOLDING AREA LEFT LEG  . TOTAL KNEE ARTHROPLASTY Right 11/30/2017   Procedure: RIGHT TOTAL KNEE ARTHROPLASTY;  Surgeon: Mcarthur Rossetti, MD;  Location: [facility];  Service: Orthopedics;  Laterality: Right;  .  URETHRAL FISTULA REPAIR  ~ 1996  . URETHRAL STRICTURE DILATATION  "several times"    There were no vitals filed for this visit.  Subjective Assessment - 01/03/18 1635    Subjective  Took pain medication and muscle relaxer last night because was a little sore afte rlast session. Haven't taken any medications thus far today.     Limitations  Walking;Sitting    How long can you sit comfortably?  no limit    How long can you stand comfortably?  20  min    How long can you walk comfortably?  3 blocks taking trash cans up with leaves    Patient Stated Goals  He wants to walk 4-5 blocks and be off device. was walking a mile prior to surgery.  return to full independence.     Currently in Pain?  Yes     Pain Score  1     Pain Location  Knee    Pain Orientation  Right    Pain Descriptors / Indicators  Sore    Pain Type  Surgical pain    Pain Onset  1 to 4 weeks ago    Pain Frequency  Constant    Aggravating Factors   sitting    Pain Relieving Factors  movement    Multiple Pain Sites  No                       [facility] Adult PT Treatment/Exercise - 01/04/18 0001      Lumbar Exercises: Aerobic   Nustep  5 min L5      Knee/Hip Exercises: Stretches   Other Knee/Hip Stretches  gastroc stretch standing 3x30sec      Knee/Hip Exercises: Machines for Strengthening   Cybex Leg Press  1x10 20lbs 1x10 40lbs      Knee/Hip Exercises: Standing   Other Standing Knee Exercises  partial lunges with right foot fwd 2x10 with cues for keeping knee behind toes; star drill to promote eccentric quad strengthening x8; wall slides 1x5 no holds and 1x5 3-5  sec holds at mid range      Knee/Hip Exercises: Supine   Short Arc Quad Sets Limitations  2x10 3#    Straight Leg Raises Limitations  x5 but increased compensations and fatigue  2#    Patellar Mobs  4 way     Other Supine Knee/Hip Exercises  Reverse crunches to promote knee & hip flexion along with core strengthening 2x10 on swiss ball; hamstring sets 1x10 on ball ; 1x5 bridges on swiss ball       Vasopneumatic   Number Minutes Vasopneumatic   15 minutes    Vasopnuematic Location   Knee    Vasopneumatic Pressure  Low    Vasopneumatic Temperature   34      Manual Therapy   Manual Therapy  Joint mobilization;Soft tissue mobilization    Joint Mobilization  patellar mobs and PA tibofemoral for extension    Soft tissue mobilization  distal quads and hamstrings    Passive ROM  into extension and flexion              PT Education - 01/04/18 0741    Education Details  HEP; symptom manangement with stretches and rolling pin with less pain medications     Person(s) Educated  Patient    Methods  Explanation;Demonstration;Tactile  cues;Verbal cues    Comprehension  Verbalized understanding;Returned demonstration;Verbal cues required       PT Short  Term Goals - 12/31/17 0901      PT SHORT TERM GOAL #1   Title  He will be independent with initial HEP     Time  3    Period  Weeks    Status  On-going      PT SHORT TERM GOAL #2   Title  He will walk with no device in and out of home    Baseline  Ambulating without AD at appointment     Time  4    Period  Weeks    Status  On-going      PT SHORT TERM GOAL #3   Title  He will report pain dcreased 50% or more with activiy on feet.     Time  4    Period  Weeks    Status  On-going      PT SHORT TERM GOAL #4   Title  Active Rt knee flexion to 120 degree s    Baseline  105    Time  4    Period  Weeks    Status  On-going      PT SHORT TERM GOAL #5   Title  Active RT knee extension to -15 degrees    Baseline  -9    Time  4    Period  Weeks    Status  On-going        PT Long Term Goals - 01/02/18 0805      PT LONG TERM GOAL #1   Title  He will be indpendent with all HEp issued    Time  12    Period  Weeks    Status  On-going      PT LONG TERM GOAL #2   Title  He will walk  for all activity withnout device.     Baseline  Not currently using AD    Time  12    Period  Weeks    Status  Partially Met      PT LONG TERM GOAL #3   Title  He will be able to walk stairs step over step with one rail.     Baseline  still utilizin rail in community     Time  12    Period  Weeks    Status  On-going      PT LONG TERM GOAL #4   Title  He will report return to driving    Time  12    Period  Weeks    Status  On-going      PT LONG TERM GOAL #5   Title   He will report return to all normal home  tasks.     Time  12    Period  Weeks    Status  On-going      PT LONG TERM GOAL #6   Title  He will report pain as intermittant    Time  12    Period  Weeks    Status  On-going      PT LONG TERM GOAL #7   Title  FOTO score to decr to 35% limited to demo  functional improvement    Time  12    Period  Weeks    Status  On-going            Plan - 01/04/18 0742    Clinical Impression Statement  Pt continues to progress well with quad strenghenging and single leg stance activities. Pt with improved  active knee flexion and extension following passive stretchs and mobilizations. Instructed pt in techniques for tight distal quad and hamstring tightness as that continues to be pt's biggest complaint.       History and Personal Factors relevant to plan of care:  Rt THA, Lt TKA, RA, Bil shoulder decr ROM and LE elbow stiffness and pain     Clinical Presentation  Evolving    Clinical Presentation due to:  post RT TKA    Clinical Decision Making  Moderate    Rehab Potential  Good    PT Frequency  2x / week    PT Duration  12 weeks    PT Treatment/Interventions  Cryotherapy;Vasopneumatic Device;Therapeutic exercise;Patient/family education;Passive range of motion;Manual techniques;Functional mobility training;Stair training;Gait training    PT Next Visit Plan  LE strength functional, continue to progress ROM and gait, note edema changes; single leg stance activities, partial lunges, leg presses, functional squats, progress steps     PT Home Exercise Plan  stand wall reaching with glut/quad set; gastroc stretch     Consulted and Agree with Plan of Care  Patient       Patient will benefit from skilled therapeutic intervention in order to improve the following deficits and impairments:  Pain, Decreased activity tolerance, Decreased endurance, Decreased range of motion, Decreased strength, Difficulty walking, Increased edema  Visit Diagnosis: Acute pain of right knee  Stiffness of right knee, not elsewhere classified  Difficulty in walking, not elsewhere classified  Localized edema     Problem List Patient Active Problem List   Diagnosis Date Noted  . Bradycardia with 31-40 beats per minute 12/01/2017  . Status post total knee replacement,  right 11/30/2017  . Unilateral primary osteoarthritis, right knee 10/17/2017  . S/P ICD (internal cardiac defibrillator) procedure 08/05/2014  . Nonischemic dilated cardiomyopathy ([place])   . Hypertension   . Aortic insufficiency   . Persistent atrial fibrillation   . Chronic systolic heart failure ([place])   . Aortic root dilatation ([place])   . Urinary retention 12/01/2012  . Degenerative arthritis of left knee 11/29/2012  . Degenerative arthritis of hip 09/08/2011    Carney Living  PT DPT  01/04/2018, 9:47 AM   Einar Crow SPT  01/04/1018  During this treatment session, the therapist was present, participating in and directing the treatment.  [place], [place], [address] Phone: [phone]   Fax:  [phone]  Name: Terry [name] MRN: [id number] Date of Birth: 07/09/1955

## 2018-01-07 ENCOUNTER — Encounter: Payer: Self-pay | Admitting: Physical Therapy

## 2018-01-07 ENCOUNTER — Ambulatory Visit: Payer: BC Managed Care – PPO | Admitting: Physical Therapy

## 2018-01-07 DIAGNOSIS — R600 Localized edema: Secondary | ICD-10-CM

## 2018-01-07 DIAGNOSIS — M25561 Pain in right knee: Secondary | ICD-10-CM | POA: Diagnosis not present

## 2018-01-07 DIAGNOSIS — R262 Difficulty in walking, not elsewhere classified: Secondary | ICD-10-CM

## 2018-01-07 DIAGNOSIS — M25661 Stiffness of right knee, not elsewhere classified: Secondary | ICD-10-CM

## 2018-01-07 NOTE — Therapy (Addendum)
[place], [place], [address] Phone: [phone]   Fax:  [phone]  Physical Therapy Treatment  Patient Details  Name: Terry [name] MRN: [id number] Date of Birth: 1955-09-27 Referring Provider (PT): Jean Rosenthal, MD   Encounter Date: 01/07/2018  PT End of Session - 01/07/18 1724    Visit Number  7    Number of Visits  24    Date for PT Re-Evaluation  03/09/18    Authorization Type  BCBS/ MCR    PT Start Time  1630    PT Stop Time  1730    PT Time Calculation (min)  60 min    Activity Tolerance  Patient tolerated treatment well;No increased pain    Behavior During Therapy  WFL for tasks assessed/performed       Past Medical History:  Diagnosis Date  . AICD (automatic cardioverter/defibrillator) present   . Anemia    "S/P knee & hip OR"  . Aortic insufficiency    mild   . Aortic root dilatation ([place])   . Atrial fibrillation ([place])    "off and on since 2013" (08/05/2014)  . Chronic systolic CHF (congestive heart failure), NYHA class 1 ([place])   . Complication of anesthesia    hx of irregular beat after anesthesia in ~ 1990's  . Dysrhythmia   . Ejection fraction < 50%    by echo 08/25/11   . GERD (gastroesophageal reflux disease)   . H/O hematuria   . H/O hiatal hernia   . 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 History of blood transfusion 2013; 2014   S/P knee and hip OR  . Hypertension   . 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 Rheumatoid arthritis ([place]) dx'd 1995    Past Surgical History:  Procedure Laterality Date  . CARDIAC CATHETERIZATION  ~ 2004   normal  . CARDIAC CATHETERIZATION N/A 09/07/2014   Procedure: Left Heart Cath and Coronary Angiography;  Surgeon: Larey Dresser, MD;  Location: [facility];  Service: Cardiovascular;  Laterality: N/A;  . CARDIAC DEFIBRILLATOR PLACEMENT  08/05/2014   St. Jude  . CARDIOVERSION N/A 08/21/2013   Procedure: CARDIOVERSION;  Surgeon: Sueanne Margarita, MD;  Location: [facility];  Service: Cardiovascular;  Laterality: N/A;  . COLONOSCOPY  2010; 2015   "polyps; no polyps"  . EP IMPLANTABLE DEVICE N/A 08/05/2014   Procedure: ICD Implant;  Surgeon: Evans Lance, MD;  Location: [facility];  Service: Cardiovascular;  Laterality: N/A;  . 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 JOINT REPLACEMENT    . NASAL SEPTUM SURGERY  ~ 1975  . TONSILLECTOMY    . TOTAL HIP ARTHROPLASTY  09/08/2011   Procedure: TOTAL HIP ARTHROPLASTY ANTERIOR APPROACH;  Surgeon: Mcarthur Rossetti, MD;  [facility];  Service: Orthopedics;  Laterality: Right;  Right total hip replacement, left knee steroid injection  . TOTAL KNEE ARTHROPLASTY Left 11/29/2012   Procedure: LEFT TOTAL KNEE ARTHROPLASTY;  Surgeon: Mcarthur Rossetti, MD;  [facility];  Service: Orthopedics;  Laterality: Left;  FEMORAL NERVE BLOCK IN HOLDING AREA LEFT LEG  . TOTAL KNEE ARTHROPLASTY Right 11/30/2017   Procedure: RIGHT TOTAL KNEE ARTHROPLASTY;  Surgeon: Mcarthur Rossetti, MD;  [facility];  Service: Orthopedics;  Laterality: Right;  . URETHRAL FISTULA REPAIR  ~ 1996  . URETHRAL STRICTURE DILATATION  "several times"  There were no vitals filed for this visit.  Subjective Assessment - 01/07/18 1637    Subjective  Blowed leaves yesturday for an hour and didn't have any swelling or increased pain. Not taking any pain medications. Still having problems sleeping a full night in bed. Wakes up about 3am because the knee is uncomforable and usually finishes night out in recliner or either onto side with pillow in between/.     Limitations  Walking;Sitting    How long can you sit comfortably?  no limit    How long can you stand comfortably?  20  min    How long can you walk comfortably?  3 blocks taking trash cans up with leaves    Patient Stated Goals  He wants to walk 4-5 blocks and be off device.  was walking a mile prior to surgery.  return to full independence.     Currently in Pain?  No/denies    Pain Score  0-No pain    Pain Location  Knee    Pain Orientation  Right    Pain Descriptors / Indicators  Sore    Pain Type  Surgical pain    Pain Onset  1 to 4 weeks ago    Pain Frequency  Occasional    Aggravating Factors   sitting sleeping on back     Pain Relieving Factors  movement     Multiple Pain Sites  No         [facility] PT Assessment - 01/07/18 0001      AROM   Right Knee Extension  -8    Right Knee Flexion  115      PROM   Right/Left Knee  Right;Left    Right Knee Flexion  117                   [facility] Adult PT Treatment/Exercise - 01/07/18 0001      Lumbar Exercises: Aerobic   Nustep  5 min L5      Knee/Hip Exercises: Stretches   Other Knee/Hip Stretches  Seated hamstring stretch 3x30sec with with AP at distal quad      Knee/Hip Exercises: Machines for Strengthening   Cybex Leg Press  1x10 40lbs 1x15 60lbs      Knee/Hip Exercises: Standing   Lateral Step Up Limitations  1x10 6"    Forward Step Up Limitations  1x10 6"    Other Standing Knee Exercises  squats 2x10 with cues to shift weight onto RLE as intially favoriing left side; partial stationary lunges Bil 2x5       Knee/Hip Exercises: Seated   Long Arc Quad Weight  3 lbs.   with ball squeeze   Sit to Sand  15 reps;without UE support   with blue t-band around knees and 4# dubmbell UE     Knee/Hip Exercises: Supine   Quad Sets Limitations  --    Short Arc Quad Sets Limitations  1x15 4#    Straight Leg Raises  1 set;10 reps   3#   Patellar Mobs  4 way     Other Supine Knee/Hip Exercises  hamstring sets 1x10 on swiss ball; bridges 1x10 on swiss ball      Vasopneumatic   Number Minutes Vasopneumatic   15 minutes    Vasopnuematic Location   Knee    Vasopneumatic Pressure  Low    Vasopneumatic Temperature   34      Manual Therapy   Manual Therapy  Joint mobilization  Joint  Mobilization  patellar mobs and PA tibofemoral for extension and AP for flexion    Passive ROM  into extension and flexion              PT Education - 01/07/18 1658    Education Details  HEP; stretches emphasizing extension    Person(s) Educated  Patient    Methods  Explanation;Demonstration;Tactile cues;Verbal cues    Comprehension  Verbalized understanding;Returned demonstration;Verbal cues required       PT Short Term Goals - 01/07/18 1729      PT SHORT TERM GOAL #1   Title  He will be independent with initial HEP     Baseline  Performing stretches and exercises daily     Time  3    Period  Weeks    Status  On-going      PT SHORT TERM GOAL #2   Title  He will walk with no device in and out of home    Baseline  Ambulating without AD at appointment     Time  4    Period  Weeks    Status  Partially Met      PT SHORT TERM GOAL #3   Title  He will report pain dcreased 50% or more with activiy on feet.     Baseline  Pain stays down with hour of yardwork; just tightness     Time  4    Period  Weeks    Status  On-going      PT SHORT TERM GOAL #4   Title  Active Rt knee flexion to 120 degrees    Baseline  115 actively     Time  4    Period  Weeks    Status  On-going      PT SHORT TERM GOAL #5   Title  Active RT knee extension to -15 degrees    Baseline  -8    Time  4    Period  Weeks    Status  On-going        PT Long Term Goals - 01/07/18 1731      PT LONG TERM GOAL #1   Title  He will be indpendent with all HEp issued    Time  12    Period  Weeks    Status  On-going      PT LONG TERM GOAL #2   Title  He will walk  for all activity withnout device.     Baseline  Not currently ambulating into therapy with AD    Time  12    Period  Weeks    Status  Partially Met      PT LONG TERM GOAL #3   Title  He will be able to walk stairs step over step with one rail.     Baseline  still utilizin rail in community     Time  12    Period  Weeks    Status   On-going      PT LONG TERM GOAL #4   Title  He will report return to driving    Baseline  Driving to and from appointments     Time  12    Period  Weeks    Status  Partially Met      PT LONG TERM GOAL #5   Title   He will report return to all normal home  tasks.     Baseline  Blowing leafs in  yard for an hour or more     Time  12    Period  Weeks    Status  On-going      PT LONG TERM GOAL #6   Title  He will report pain as intermittant    Time  12    Period  Weeks    Status  On-going      PT LONG TERM GOAL #7   Title  FOTO score to decr to 35% limited to demo functional improvement    Time  12    Period  Weeks    Status  On-going            Plan - 01/07/18 1725    Clinical Impression Statement  Pt tolerated treatment well. Progressed into squats with UE support with cues for weight shifting onto RLE as pt demonstrating weight shift onto LLE at mid range. Continue to progress leg presses and step ups with good technique.  Pt reports he has not taken any pain medication in over a week and has been able to tolerate working in yard for an hour without any pain.     History and Personal Factors relevant to plan of care:  Rt THA, Lt TKA, RA, Bil shoulder decr ROM and LE elbow stiffness and pain     Clinical Presentation  Evolving    Clinical Presentation due to:  post RT TKA    Clinical Decision Making  Moderate    Rehab Potential  Good    PT Frequency  2x / week    PT Duration  12 weeks    PT Treatment/Interventions  Cryotherapy;Vasopneumatic Device;Therapeutic exercise;Patient/family education;Passive range of motion;Manual techniques;Functional mobility training;Stair training;Gait training    PT Next Visit Plan   LE strength functional, continue to progress ROM and gait, note edema changes; single leg stance activities, partial lunges, leg presses, functional squats, progress steps     PT Home Exercise Plan  stand wall reaching with glut/quad set; gastroc stretch      Consulted and Agree with Plan of Care  Patient       Patient will benefit from skilled therapeutic intervention in order to improve the following deficits and impairments:  Pain, Decreased activity tolerance, Decreased endurance, Decreased range of motion, Decreased strength, Difficulty walking, Increased edema  Visit Diagnosis: Acute pain of right knee  Stiffness of right knee, not elsewhere classified  Difficulty in walking, not elsewhere classified  Localized edema     Problem List Patient Active Problem List   Diagnosis Date Noted  . Bradycardia with 31-40 beats per minute 12/01/2017  . Status post total knee replacement, right 11/30/2017  . Unilateral primary osteoarthritis, right knee 10/17/2017  . S/P ICD (internal cardiac defibrillator) procedure 08/05/2014  . Nonischemic dilated cardiomyopathy ([place])   . Hypertension   . Aortic insufficiency   . Persistent atrial fibrillation   . Chronic systolic heart failure ([place])   . Aortic root dilatation ([place])   . Urinary retention 12/01/2012  . Degenerative arthritis of left knee 11/29/2012  . Degenerative arthritis of hip 09/08/2011   Carolyne Littles PT DPT  01/07/2018   Einar Crow SPT 01/07/2018, 5:37 PM During this treatment session, the therapist was present, participating in and directing the treatment.  [facility], [place], [address] Phone: [phone]   Fax:  [phone]  Name: Terry [name] MRN: [id number] Date of Birth: [birth year]

## 2018-01-09 ENCOUNTER — Ambulatory Visit (INDEPENDENT_AMBULATORY_CARE_PROVIDER_SITE_OTHER): Payer: Medicare Other | Admitting: Orthopaedic Surgery

## 2018-01-09 ENCOUNTER — Encounter (INDEPENDENT_AMBULATORY_CARE_PROVIDER_SITE_OTHER): Payer: Self-pay | Admitting: Orthopaedic Surgery

## 2018-01-09 DIAGNOSIS — Z96651 Presence of right artificial knee joint: Secondary | ICD-10-CM

## 2018-01-09 NOTE — Progress Notes (Signed)
HPI: Terry [name] returns today follow-up of his right total knee arthroplasty is now 40 days postop.  He is overall doing well.  He is going to formal physical therapy and feels is helping is almost finished as his range of motion is greatly improved.  He is taking no pain medicines.  Physical exam: Right knee has full extension flexion to at least 110 degrees no instability valgus varus stressing.  Calf supple nontender.   Plan: He will finish up with formal physical therapy then continue his home exercise program.  Continue scar tissue mobilization.  Follow-up with [place] at 6 months postop no radiographs at that time.  Questions encouraged and answered

## 2018-01-14 ENCOUNTER — Ambulatory Visit: Payer: BC Managed Care – PPO | Attending: Orthopaedic Surgery | Admitting: Physical Therapy

## 2018-01-14 ENCOUNTER — Encounter: Payer: Self-pay | Admitting: Physical Therapy

## 2018-01-14 DIAGNOSIS — R262 Difficulty in walking, not elsewhere classified: Secondary | ICD-10-CM | POA: Diagnosis present

## 2018-01-14 DIAGNOSIS — M25661 Stiffness of right knee, not elsewhere classified: Secondary | ICD-10-CM | POA: Insufficient documentation

## 2018-01-14 DIAGNOSIS — R600 Localized edema: Secondary | ICD-10-CM | POA: Insufficient documentation

## 2018-01-14 DIAGNOSIS — M25561 Pain in right knee: Secondary | ICD-10-CM | POA: Diagnosis not present

## 2018-01-14 NOTE — Therapy (Addendum)
[place], [place], [address] Phone: [phone]   Fax:  [phone]  Physical Therapy Treatment  Patient Details  Name: Terry [name] MRN: [id number] Date of Birth: December 06, 1955 Referring Provider (PT): Jean Rosenthal, MD   Encounter Date: 01/14/2018  PT End of Session - 01/14/18 1346    Visit Number  8    Number of Visits  24    Date for PT Re-Evaluation  03/09/18    Authorization Type  BCBS/ MCR    Authorization Time Period  progress on visit 10 and KX at visit 15    PT Start Time  1102    PT Stop Time  1200    PT Time Calculation (min)  58 min    Activity Tolerance  Patient tolerated treatment well;No increased pain    Behavior During Therapy  WFL for tasks assessed/performed       Past Medical History:  Diagnosis Date  . AICD (automatic cardioverter/defibrillator) present   . Anemia    "S/P knee & hip OR"  . Aortic insufficiency    mild   . Aortic root dilatation ([place])   . Atrial fibrillation ([place])    "off and on since 2013" (08/05/2014)  . Chronic systolic CHF (congestive heart failure), NYHA class 1 ([place])   . Complication of anesthesia    hx of irregular beat after anesthesia in ~ 1990's  . Dysrhythmia   . Ejection fraction < 50%    by echo 08/25/11   . GERD (gastroesophageal reflux disease)   . H/O hematuria   . H/O hiatal hernia   . 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 History of blood transfusion 2013; 2014   S/P knee and hip OR  . Hypertension   . 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 Rheumatoid arthritis ([place]) dx'd 1995    Past Surgical History:  Procedure Laterality Date  . CARDIAC CATHETERIZATION  ~ 2004   normal  . CARDIAC CATHETERIZATION N/A 09/07/2014   Procedure: Left Heart Cath and Coronary Angiography;  Surgeon: Larey Dresser, MD;  Location: [facility];  Service: Cardiovascular;  Laterality: N/A;  . CARDIAC DEFIBRILLATOR  PLACEMENT  08/05/2014   [employer] Jude  . CARDIOVERSION N/A 08/21/2013   Procedure: CARDIOVERSION;  Surgeon: Sueanne Margarita, MD;  Location: [facility];  Service: Cardiovascular;  Laterality: N/A;  . COLONOSCOPY  2010; 2015   "polyps; no polyps"  . EP IMPLANTABLE DEVICE N/A 08/05/2014   Procedure: ICD Implant;  Surgeon: Evans Lance, MD;  Location: [facility];  Service: Cardiovascular;  Laterality: N/A;  . 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 JOINT REPLACEMENT    . NASAL SEPTUM SURGERY  ~ 1975  . TONSILLECTOMY    . TOTAL HIP ARTHROPLASTY  09/08/2011   Procedure: TOTAL HIP ARTHROPLASTY ANTERIOR APPROACH;  Surgeon: Mcarthur Rossetti, MD;  Location: [facility];  Service: Orthopedics;  Laterality: Right;  Right total hip replacement, left knee steroid injection  . TOTAL KNEE ARTHROPLASTY Left 11/29/2012   Procedure: LEFT TOTAL KNEE ARTHROPLASTY;  Surgeon: Mcarthur Rossetti, MD;  Location: [facility];  Service: Orthopedics;  Laterality: Left;  FEMORAL NERVE BLOCK IN HOLDING AREA LEFT LEG  . TOTAL KNEE ARTHROPLASTY Right 11/30/2017   Procedure: RIGHT TOTAL KNEE ARTHROPLASTY;  Surgeon: Mcarthur Rossetti, MD;  Location: [facility];  Service: Orthopedics;  Laterality: Right;  .  URETHRAL FISTULA REPAIR  ~ 1996  . URETHRAL STRICTURE DILATATION  "several times"    There were no vitals filed for this visit.  Subjective Assessment - 01/14/18 1344    Subjective  Increased walking and standing over weekend along with blowing leaves without any increase in pain. Visited with PA-C on 11/27 and per pt PA-C reported patient can finish up therapy over the week and cont. with HEP at home.     Patient is accompained by:  Family member    Limitations  Walking;Sitting    How long can you sit comfortably?  no limit    How long can you stand comfortably?  20  min    How long can you walk comfortably?  3 blocks taking trash cans up with leaves    Patient Stated Goals  He wants to walk  4-5 blocks and be off device. was walking a mile prior to surgery.  return to full independence.     Currently in Pain?  No/denies    Pain Orientation  Right    Pain Descriptors / Indicators  Tightness    Pain Type  Surgical pain    Pain Onset  1 to 4 weeks ago    Pain Frequency  Occasional    Aggravating Factors   sitting, sleeping on back     Pain Relieving Factors  movement     Multiple Pain Sites  No         [facility] PT Assessment - 01/14/18 0001      AROM   Right Knee Extension  -4    Right Knee Flexion  120                   [facility] Adult PT Treatment/Exercise - 01/14/18 0001      Lumbar Exercises: Aerobic   Nustep  5 min L6      Knee/Hip Exercises: Stretches   Other Knee/Hip Stretches  Seated hamstring stretch 3x30sec with with AP at distal quad      Knee/Hip Exercises: Standing   Lateral Step Up Limitations  2x10 6"    Forward Step Up Limitations  2x10 8"    Other Standing Knee Exercises  squats 2x10 with cues to shift weight onto RLE as fatgiues;      Knee/Hip Exercises: Seated   Long Arc Quad Weight  4 lbs.    Hamstring Curl  15 reps   green tband     Knee/Hip Exercises: Supine   Short Arc Quad Sets Limitations  1x103#; 1x10  4#    Straight Leg Raises  10 reps;2 sets    Straight Leg Raises Limitations  3#    Patellar Mobs  4 way       Vasopneumatic   Number Minutes Vasopneumatic   15 minutes    Vasopnuematic Location   Knee    Vasopneumatic Pressure  Low    Vasopneumatic Temperature   34      Manual Therapy   Joint Mobilization  patellar mobs and PA tibofemoral for extension and AP for flexion    Passive ROM  into extension and flexion              PT Education - 01/14/18 1346    Education Details  HEP, discussed POC , purchasing ankle weights to cont. to strengthen at home, self-patellar mobilizations     Person(s) Educated  Patient    Methods  Explanation;Demonstration;Verbal cues    Comprehension  Verbalized understanding;Returned  demonstration;Verbal  cues required       PT Short Term Goals - 01/07/18 1729      PT SHORT TERM GOAL #1   Title  He will be independent with initial HEP     Baseline  Performing stretches and exercises daily     Time  3    Period  Weeks    Status  On-going      PT SHORT TERM GOAL #2   Title  He will walk with no device in and out of home    Baseline  Ambulating without AD at appointment     Time  4    Period  Weeks    Status  Partially Met      PT SHORT TERM GOAL #3   Title  He will report pain dcreased 50% or more with activiy on feet.     Baseline  Pain stays down with hour of yardwork; just tightness     Time  4    Period  Weeks    Status  On-going      PT SHORT TERM GOAL #4   Title  Active Rt knee flexion to 120 degrees    Baseline  115 actively     Time  4    Period  Weeks    Status  On-going      PT SHORT TERM GOAL #5   Title  Active RT knee extension to -15 degrees    Baseline  -8    Time  4    Period  Weeks    Status  On-going        PT Long Term Goals - 01/07/18 1731      PT LONG TERM GOAL #1   Title  He will be indpendent with all HEp issued    Time  12    Period  Weeks    Status  On-going      PT LONG TERM GOAL #2   Title  He will walk  for all activity withnout device.     Baseline  Not currently ambulating into therapy with AD    Time  12    Period  Weeks    Status  Partially Met      PT LONG TERM GOAL #3   Title  He will be able to walk stairs step over step with one rail.     Baseline  still utilizin rail in community     Time  12    Period  Weeks    Status  On-going      PT LONG TERM GOAL #4   Title  He will report return to driving    Baseline  Driving to and from appointments     Time  12    Period  Weeks    Status  Partially Met      PT LONG TERM GOAL #5   Title   He will report return to all normal home  tasks.     Baseline  Blowing leafs in yard for an hour or more     Time  12    Period  Weeks    Status  On-going       PT LONG TERM GOAL #6   Title  He will report pain as intermittant    Time  12    Period  Weeks    Status  On-going      PT LONG TERM GOAL #7   Title  FOTO score to decr to 35% limited to demo functional improvement    Time  12    Period  Weeks    Status  On-going            Plan - 01/14/18 1348    Clinical Impression Statement  Pt reports he will finish up with therapy at next appointment as he seen MD last week and is satisfied with knee ROM. Patient demonstrates improvements in R knee flexion and extension. Continued with similiar exercise program from previous few sessions, increasing ankle weights and step height with good tolerance. Pt continues to require min cues for squats at end of sets as he weight shifts off RLE as he fatigues. Plan to discuss final HEP and discharge pt at next appointment.     History and Personal Factors relevant to plan of care:  Rt THA, Lt TKA, RA, Bil shoulder decr ROM and LE elbow stiffness and pain     Clinical Presentation  Evolving    Clinical Presentation due to:  post RT TKA    Clinical Decision Making  Moderate    Rehab Potential  Good    PT Frequency  2x / week    PT Duration  12 weeks    PT Treatment/Interventions  Cryotherapy;Vasopneumatic Device;Therapeutic exercise;Patient/family education;Passive range of motion;Manual techniques;Functional mobility training;Stair training;Gait training    PT Next Visit Plan  finalize HEP and plan for d/c at next session     PT Home Exercise Plan  stand wall reaching with glut/quad set; gastroc stretch     Consulted and Agree with Plan of Care  Patient       Patient will benefit from skilled therapeutic intervention in order to improve the following deficits and impairments:  Pain, Decreased activity tolerance, Decreased endurance, Decreased range of motion, Decreased strength, Difficulty walking, Increased edema  Visit Diagnosis: Acute pain of right knee  Stiffness of right knee, not elsewhere  classified  Difficulty in walking, not elsewhere classified  Localized edema     Problem List Patient Active Problem List   Diagnosis Date Noted  . Bradycardia with 31-40 beats per minute 12/01/2017  . Status post total knee replacement, right 11/30/2017  . Unilateral primary osteoarthritis, right knee 10/17/2017  . S/P ICD (internal cardiac defibrillator) procedure 08/05/2014  . Nonischemic dilated cardiomyopathy ([place])   . Hypertension   . Aortic insufficiency   . Persistent atrial fibrillation   . Chronic systolic heart failure ([place])   . Aortic root dilatation ([place])   . Urinary retention 12/01/2012  . Degenerative arthritis of left knee 11/29/2012  . Degenerative arthritis of hip 09/08/2011   Carolyne Littles PT DPT  01/14/2018   Einar Crow SPT 01/14/2018, 1:57 PM   During this treatment session, the therapist was present, participating in and directing the treatment.   [place], [place], [address] Phone: [phone]   Fax:  [phone]  Name: VALE MOUSSEAU MRN: [id number] Date of Birth: [birth year]

## 2018-01-17 ENCOUNTER — Ambulatory Visit: Payer: BC Managed Care – PPO | Admitting: Physical Therapy

## 2018-01-17 ENCOUNTER — Encounter: Payer: Self-pay | Admitting: Physical Therapy

## 2018-01-17 DIAGNOSIS — M25561 Pain in right knee: Secondary | ICD-10-CM

## 2018-01-17 DIAGNOSIS — R262 Difficulty in walking, not elsewhere classified: Secondary | ICD-10-CM

## 2018-01-17 DIAGNOSIS — R600 Localized edema: Secondary | ICD-10-CM

## 2018-01-17 DIAGNOSIS — M25661 Stiffness of right knee, not elsewhere classified: Secondary | ICD-10-CM

## 2018-01-17 NOTE — Patient Instructions (Signed)
Short Arc Quad with Ankle Weight reps: 10 sets: 3 daily: 1 weekly: 7   Exercise image step 1   Exercise image step 2  Setup  Begin lying on your back with one leg resting on a foam roller and a weight secured around your ankle. Movement  Tighten your thigh muscles and slowly raise your foot off the ground, straightening your knee, then relax, and repeat. Tip  Make sure to keep your low back flat against the floor and do not let your leg rotate to either side. Standing Alternating Knee Flexion with Ankle Weights reps: 10 sets: 3 daily: 1 weekly: 7   Exercise image step 1   Exercise image step 2  Setup  Begin in a standing upright position with weights around your ankles. Movement  Bend one knee to 90 degrees, lower it back down, and repeat with your other leg. Tip  Make sure to keep your back straight and maintain your balance during the exercise. Disclaimer: This program provides exercises related to your condition that you can perform at home. As there is a risk of injury with any activity, use caution when performing exercises. If you experience any pain or discomfort, discontinue the exercises and contact your health care provider.  Login URL: [email or web address] . Access Code: [email or web address] . Date printed: 01/17/2018 Page 2  Mini Squat with Counter Support reps: 10 sets: 3 daily: 1 weekly: 7   Exercise image step 1   Exercise image step 2  Setup  Begin in a standing upright position with your feet shoulder width apart and your hands resting on a counter. Movement  Slowly bend your knees to lower into a mini squat position. Hold briefly, then press into your feet to return to a standing upright position and repeat. Tip  Make sure to keep your heels on the ground and use the counter to help you balance as needed. Do not let your knees bend forward past your toes or collapse inward. Lateral Step Up reps: 10 sets: 3 daily: 1 weekly: 7   Exercise image step 1   Exercise  image step 2   Exercise image step 3  Setup  Begin in a standing upright position with a step to your side. Movement  Step up with the foot closest to the step, then follow with you other foot. Step back down in the opposite order and repeat. Tip  Make sure to maintain your balance during the exercise and try to keep your hips level. Disclaimer: This program provides exercises related to your condition that you can perform at home. As there is a risk of injury with any activity, use caution when performing exercises. If you experience any pain or discomfort, discontinue the exercises and contact your health care provider.  Login URL: [email or web address] . Access Code: [email or web address] . Date printed: 01/17/2018 Page 3  Step Up reps: 10 sets: 3 daily: 1 weekly: 7   Exercise image step 1   Exercise image step 2  Setup  Begin standing with a small step or platform in front of you. Movement  Step up onto the platform with one foot then follow with your other foot. Return back down to the starting position and repeat.  Tip  Make sure to maintain good posture during the exercise and do not let your knee bend forward past your toe as you step up. Standing Terminal Knee Extension at [employer] with Ball reps: 10 sets: 3 daily: 1 weekly: 7   Exercise image step 1  Exercise image step 2  Setup  Begin standing with your knee slightly bent, and a soft ball between the back of your knee and a wall. Movement  Straighten your knee, pushing the ball gently into the wall, then relax and repeat. Tip  Make sure to maintain your balance and good posture during the exercise. Do not lock out your knee when straightening it. Disclaimer: This program provides exercises related to your condition that you can perform at home. As there is a risk of injury with any activity, use caution when performing exercises. If you experience any pain or discomfort, discontinue the exercises and contact your health care  provider.  Login URL: [email or web address] . Access Code: [email or web address] . Date printed: 01/17/2018 Page 4  Seated Hamstring Stretch reps: 10 sets: 3 daily: 1 weekly: 7   Exercise image step 1   Exercise image step 2  Setup  Begin sitting upright with one leg straight forward and your heel resting on the ground. Movement  Bend your trunk forward, hinging at your hips until you feel a stretch in the back of your leg. Hold this position.  Tip  Make sure to keep your knee straight during the stretch and do not let your back arch or slump. Gastroc Stretch on Wall reps: 10 sets: 3 daily: 1 weekly: 7   Exercise image step 1   Exercise image step 2  Setup  Setup Directions Movement  Begin in a standing upright position in front of a wall.  Tip  Place your hands on the wall and extend one leg straight backward, bending your front leg, until you feel a stretch in the calf of your back leg and hold.  Disclaimer: This program provides exercises related to your condition that you can perform at home. As there is a risk of injury with any activity, use caution when performing exercises. If you experience any pain or discomfort, discontinue the exercises and contact your health care provider.  Login URL: [email or web address] . Access Code: [email or web address] . Date printed: 01/17/2018 Page 5  Standing Hip Abduction reps: 10 sets: 3 daily: 1 weekly: 7   Exercise image step 1   Exercise image step 2  Setup  Begin in a standing upright position in front of a counter or stable surface for support. Movement  Slowly lift your surgical leg out to your side, hold briefly, then return to the starting position and repeat. Tip  Make sure to keep your toes pointing forward and do not turn your leg in or out during the exercise. Seated Long Arc Quad with Ankle Weight reps: 10 sets: 3 daily: 1 weekly: 7   Exercise image step 1   Exercise image step 2  Setup  Begin sitting upright in a chair with a  weight around one ankle. Movement  Slowly straighten one knee so that your leg is straight out in front of you. Hold, then lower it back to the starting position and repeat. Tip  Make sure to keep your back straight during the exercise.

## 2018-01-17 NOTE — Therapy (Addendum)
[place], [place], [address] Phone: [phone]   Fax:  [phone]  Physical Therapy Treatment & Discharge  Patient Details  Name: Terry [name] MRN: [id number] Date of Birth: December 13, 1955 Referring Provider (PT): Jean Rosenthal, MD   Encounter Date: 01/17/2018  PT End of Session - 01/17/18 1217    Visit Number  9    Number of Visits  24    Date for PT Re-Evaluation  03/09/18    Authorization Type  BCBS/ MCR    Authorization Time Period  progress on visit 10 and KX at visit 15    PT Start Time  1142    PT Stop Time  1240    PT Time Calculation (min)  58 min    Activity Tolerance  Patient tolerated treatment well;No increased pain    Behavior During Therapy  WFL for tasks assessed/performed       Past Medical History:  Diagnosis Date  . AICD (automatic cardioverter/defibrillator) present   . Anemia    "S/P knee & hip OR"  . Aortic insufficiency    mild   . Aortic root dilatation ([facility])   . Atrial fibrillation ([place])    "off and on since 2013" (08/05/2014)  . Chronic systolic CHF (congestive heart failure), NYHA class 1 ([facility])   . Complication of anesthesia    hx of irregular beat after anesthesia in ~ 1990's  . Dysrhythmia   . Ejection fraction < 50%    by echo 08/25/11   . GERD (gastroesophageal reflux disease)   . H/O hematuria   . H/O hiatal hernia   . 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 History of blood transfusion 2013; 2014   S/P knee and hip OR  . Hypertension   . 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 Rheumatoid arthritis ([place]) dx'd 1995    Past Surgical History:  Procedure Laterality Date  . CARDIAC CATHETERIZATION  ~ 2004   normal  . CARDIAC CATHETERIZATION N/A 09/07/2014   Procedure: Left Heart Cath and Coronary Angiography;  Surgeon: Larey Dresser, MD;  Location: [facility];  Service: Cardiovascular;  Laterality: N/A;  . CARDIAC  DEFIBRILLATOR PLACEMENT  08/05/2014   St. Jude  . CARDIOVERSION N/A 08/21/2013   Procedure: CARDIOVERSION;  Surgeon: Sueanne Margarita, MD;  Location: [facility];  Service: Cardiovascular;  Laterality: N/A;  . COLONOSCOPY  2010; 2015   "polyps; no polyps"  . EP IMPLANTABLE DEVICE N/A 08/05/2014   Procedure: ICD Implant;  Surgeon: Evans Lance, MD;  Location: [facility];  Service: Cardiovascular;  Laterality: N/A;  . 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 JOINT REPLACEMENT    . NASAL SEPTUM SURGERY  ~ 1975  . TONSILLECTOMY    . TOTAL HIP ARTHROPLASTY  09/08/2011   Procedure: TOTAL HIP ARTHROPLASTY ANTERIOR APPROACH;  Surgeon: Mcarthur Rossetti, MD;  Location: [facility];  Service: Orthopedics;  Laterality: Right;  Right total hip replacement, left knee steroid injection  . TOTAL KNEE ARTHROPLASTY Left 11/29/2012   Procedure: LEFT TOTAL KNEE ARTHROPLASTY;  Surgeon: Mcarthur Rossetti, MD;  Location: [facility];  Service: Orthopedics;  Laterality: Left;  FEMORAL NERVE BLOCK IN HOLDING AREA LEFT LEG  . TOTAL KNEE ARTHROPLASTY Right 11/30/2017   Procedure: RIGHT TOTAL KNEE ARTHROPLASTY;  Surgeon: Mcarthur Rossetti, MD;  Location: [facility];  Service: Orthopedics;  Laterality: Right;  . URETHRAL FISTULA REPAIR  ~ 1996  . URETHRAL STRICTURE DILATATION  "several times"    There were no vitals filed for this visit.  Subjective Assessment - 01/17/18 1323    Subjective  Some soreness after last session but not bad. No pain today, have blown leaves in the yard past few days.    Patient is accompained by:  Family member    Limitations  Walking;Sitting    How long can you sit comfortably?  no limit    How long can you stand comfortably?  20  min    How long can you walk comfortably?  3 blocks taking trash cans up with leaves    Patient Stated Goals  He wants to walk 4-5 blocks and be off device. was walking a mile prior to surgery.  return to full independence.      Currently in Pain?  No/denies    Pain Location  Knee    Pain Orientation  Right    Pain Descriptors / Indicators  Tightness    Pain Type  Surgical pain    Pain Onset  1 to 4 weeks ago    Pain Frequency  Occasional    Aggravating Factors   sitting, sleeping on back    Pain Relieving Factors  movement         [facility] PT Assessment - 01/17/18 0001      AROM   Right Knee Extension  -4    Right Knee Flexion  120      PROM   Right Knee Flexion  122      Strength   Right Knee Flexion  5/5    Right Knee Extension  5/5                   [facility] Adult PT Treatment/Exercise - 01/17/18 0001      Lumbar Exercises: Aerobic   Nustep  5 min L6      Knee/Hip Exercises: Stretches   Other Knee/Hip Stretches  Seated hamstring stretch 3x30sec with with AP at distal quad   cues to not bounce      Knee/Hip Exercises: Machines for Strengthening   Cybex Leg Press  2x10 40lbs      Knee/Hip Exercises: Standing   Heel Raises Limitations  x20     Hip Flexion Limitations  x10    Lateral Step Up Limitations  2x10 6"    Forward Step Up Limitations  2x10 6"    Other Standing Knee Exercises  squats x10 with cues to prevent valgus with fatigue ; SLS 3x30sec; hamstring curls 2x10 4#; Bil hip abduction 1x10      Knee/Hip Exercises: Seated   Long Arc Quad  2 sets;10 reps    Long Arc Quad Weight  4 lbs.   x10; 5#x10   Sit to [employer]  10 reps;with UE support   demonstrating good technqiue     Knee/Hip Exercises: Supine   Short Arc Quad Sets Limitations  1x20 5#    Straight Leg Raises Limitations  1x5 4# 1x5 5#      Vasopneumatic   Number Minutes Vasopneumatic   15 minutes    Vasopnuematic Location   Knee    Vasopneumatic Pressure  Low    Vasopneumatic Temperature   34             PT Education - 01/17/18 1324    Education Details  Final HEP and importance of cont. with program at  discharge    Person(s) Educated  Patient    Methods  Explanation;Demonstration;Verbal cues;Handout     Comprehension  Verbalized understanding;Returned demonstration       PT Short Term Goals - 01/17/18 1423      PT SHORT TERM GOAL #1   Title  He will be independent with initial HEP     Baseline  Performing stretches and exercises 3x/day    Time  3    Period  Weeks    Status  Achieved      PT SHORT TERM GOAL #2   Title  He will walk with no device in and out of home    Baseline  Not using AD    Time  4    Period  Weeks    Status  Achieved      PT SHORT TERM GOAL #3   Title  He will report pain dcreased 50% or more with activiy on feet.     Baseline  Doing yardwork multiple times thoughout the week without any increase in pain     Time  4    Period  Weeks    Status  Achieved      PT SHORT TERM GOAL #4   Title  Active Rt knee flexion to 120 degrees    Baseline  120 actively     Time  4    Period  Weeks    Status  Achieved      PT SHORT TERM GOAL #5   Title  Active RT knee extension to -15 degrees    Baseline  -4    Time  4    Period  Weeks    Status  Achieved        PT Long Term Goals - 01/17/18 1426      PT LONG TERM GOAL #1   Title  He will be indpendent with all HEp issued    Baseline  Performing exercises 3x day    Time  12    Period  Weeks    Status  Achieved      PT LONG TERM GOAL #2   Title  He will walk  for all activity without device.     Baseline  Not using AD     Time  12    Period  Weeks    Status  Achieved      PT LONG TERM GOAL #3   Title  He will be able to walk stairs step over step with one rail.     Baseline  performing stairs with step over step with 1 rail    Time  12    Period  Weeks    Status  Achieved      PT LONG TERM GOAL #4   Title  He will report return to driving    Baseline  Driving to and from appointments     Time  12    Period  Weeks    Status  Achieved      PT LONG TERM GOAL #5   Title   He will report return to all normal home  tasks.     Baseline  Blowing leafs in yard for an hour or more     Time  12     Period  Weeks    Status  Achieved      PT LONG TERM GOAL #6   Title  He will report pain as intermittant    Time  12    Period  Weeks    Status  Achieved      PT LONG TERM GOAL #7   Title  FOTO score to decr to 35% limited to demo functional improvement    Time  12    Period  Weeks    Status  Achieved            Plan - 01/17/18 1442    Clinical Impression Statement  Pt reports no pain and has progressively been increasing his activity level without any soreness. Reviewed final HEP demonstrating good technique and quad control with all exercises. Pt demonstratres improvements in RLE strength and has achieved all short an long term goals. Believe the pt has reached max potential in PT and is appropriate for discharge.     History and Personal Factors relevant to plan of care:  Rt THA, Lt TKA, RA, Bil shoulder decr ROM and LE elbow stiffness and pain     Clinical Presentation  Evolving    Clinical Presentation due to:  post RT TKA    Clinical Decision Making  Moderate    Rehab Potential  Good    PT Frequency  2x / week    PT Duration  12 weeks    PT Treatment/Interventions  Cryotherapy;Vasopneumatic Device;Therapeutic exercise;Patient/family education;Passive range of motion;Manual techniques;Functional mobility training;Stair training;Gait training    PT Next Visit Plan  discharged    PT Home Exercise Plan  stand wall reaching with glut/quad set; gastroc stretch     Consulted and Agree with Plan of Care  Patient       Patient will benefit from skilled therapeutic intervention in order to improve the following deficits and impairments:  Pain, Decreased activity tolerance, Decreased endurance, Decreased range of motion, Decreased strength, Difficulty walking, Increased edema  Visit Diagnosis: Acute pain of right knee  Stiffness of right knee, not elsewhere classified  Difficulty in walking, not elsewhere classified  Localized edema  PHYSICAL THERAPY DISCHARGE  SUMMARY  Visits from Start of Care:9  Current functional level related to goals / functional outcomes: Significant improvement in range, pain, and function    Remaining deficits: Minor pain at times    Education / Equipment: HEP  Plan: Patient agrees to discharge.  Patient goals were met. Patient is being discharged due to meeting the stated rehab goals.  ?????       Problem List Patient Active Problem List   Diagnosis Date Noted  . Bradycardia with 31-40 beats per minute 12/01/2017  . Status post total knee replacement, right 11/30/2017  . Unilateral primary osteoarthritis, right knee 10/17/2017  . S/P ICD (internal cardiac defibrillator) procedure 08/05/2014  . Nonischemic dilated cardiomyopathy ([facility])   . Hypertension   . Aortic insufficiency   . Persistent atrial fibrillation   . Chronic systolic heart failure ([facility])   . Aortic root dilatation ([place])   . Urinary retention 12/01/2012  . Degenerative arthritis of left knee 11/29/2012  . Degenerative arthritis of hip 09/08/2011   Carolyne Littles PT DPT  01/17/2018   Einar Crow SPT 01/17/2018, 3:10 PM   During this treatment session, the therapist was present, participating in and directing the treatment.  [place], [place], [address] Phone: [phone]   Fax:  [phone]  Name: ORMAN MATSUMURA MRN: [id number] Date of Birth: May 22, 1955

## 2018-02-12 LAB — CUP PACEART REMOTE DEVICE CHECK
Date Time Interrogation Session: 20191028091403
HIGH POWER IMPEDANCE MEASURED VALUE: 71 Ohm
HighPow Impedance: 71 Ohm
Implantable Lead Model: 181
Implantable Lead Serial Number: 331169
Lead Channel Impedance Value: 380 Ohm
Lead Channel Setting Pacing Amplitude: 2.5 V
Lead Channel Setting Pacing Pulse Width: 0.5 ms
MDC IDC LEAD IMPLANT DT: 20160622
MDC IDC LEAD LOCATION: 753860
MDC IDC MSMT BATTERY REMAINING LONGEVITY: 76 mo
MDC IDC MSMT BATTERY REMAINING PERCENTAGE: 74 %
MDC IDC MSMT BATTERY VOLTAGE: 2.95 V
MDC IDC MSMT LEADCHNL RV PACING THRESHOLD AMPLITUDE: 0.75 V
MDC IDC MSMT LEADCHNL RV PACING THRESHOLD PULSEWIDTH: 0.5 ms
MDC IDC MSMT LEADCHNL RV SENSING INTR AMPL: 7 mV
MDC IDC PG IMPLANT DT: 20160622
MDC IDC PG SERIAL: 7280026
MDC IDC SET LEADCHNL RV SENSING SENSITIVITY: 0.5 mV
MDC IDC STAT BRADY RV PERCENT PACED: 1 %

## 2018-02-23 ENCOUNTER — Encounter (HOSPITAL_COMMUNITY): Payer: Self-pay

## 2018-02-26 ENCOUNTER — Encounter (HOSPITAL_COMMUNITY): Payer: Self-pay

## 2018-02-27 ENCOUNTER — Telehealth (HOSPITAL_COMMUNITY): Payer: Self-pay | Admitting: *Deleted

## 2018-02-27 NOTE — Telephone Encounter (Signed)
Terry [name] submitted auth on covermymmeds on 02/22/2018. Checked status today and it is still in review. Patient aware.

## 2018-02-28 ENCOUNTER — Telehealth (HOSPITAL_COMMUNITY): Payer: Self-pay

## 2018-02-28 ENCOUNTER — Encounter (HOSPITAL_COMMUNITY): Payer: Self-pay

## 2018-02-28 NOTE — Telephone Encounter (Signed)
[facility] additional info faxed

## 2018-03-01 ENCOUNTER — Other Ambulatory Visit (HOSPITAL_COMMUNITY): Payer: Self-pay | Admitting: Cardiology

## 2018-03-01 NOTE — Telephone Encounter (Signed)
entresto PA approved via [facility] Valid 02/28/18-03/01/2019 Copy faxed to patients pharmacy [phone]

## 2018-03-11 ENCOUNTER — Ambulatory Visit (INDEPENDENT_AMBULATORY_CARE_PROVIDER_SITE_OTHER): Payer: BC Managed Care – PPO

## 2018-03-11 DIAGNOSIS — I420 Dilated cardiomyopathy: Secondary | ICD-10-CM

## 2018-03-11 DIAGNOSIS — I5022 Chronic systolic (congestive) heart failure: Secondary | ICD-10-CM

## 2018-03-13 NOTE — Progress Notes (Signed)
Remote ICD transmission.   

## 2018-03-14 LAB — CUP PACEART REMOTE DEVICE CHECK
Battery Remaining Longevity: 74 mo
Battery Remaining Percentage: 71 %
Battery Voltage: 2.95 V
Brady Statistic RV Percent Paced: 1 %
Date Time Interrogation Session: 20200127151235
HighPow Impedance: 69 Ohm
HighPow Impedance: 69 Ohm
Implantable Lead Implant Date: 20160622
Implantable Lead Location: 753860
Implantable Lead Model: 181
Implantable Lead Serial Number: 331169
Implantable Pulse Generator Implant Date: 20160622
Lead Channel Impedance Value: 380 Ohm
Lead Channel Pacing Threshold Amplitude: 0.75 V
Lead Channel Pacing Threshold Pulse Width: 0.5 ms
Lead Channel Sensing Intrinsic Amplitude: 6.7 mV
Lead Channel Setting Pacing Amplitude: 2.5 V
Lead Channel Setting Sensing Sensitivity: 0.5 mV
MDC IDC SET LEADCHNL RV PACING PULSEWIDTH: 0.5 ms
Pulse Gen Serial Number: 7280026

## 2018-03-27 DIAGNOSIS — J019 Acute sinusitis, unspecified: Secondary | ICD-10-CM | POA: Diagnosis not present

## 2018-03-28 ENCOUNTER — Ambulatory Visit (HOSPITAL_COMMUNITY)
Admission: RE | Admit: 2018-03-28 | Discharge: 2018-03-28 | Disposition: A | Discharge status: Home / Self Care | Payer: BC Managed Care – PPO | Source: Ambulatory Visit | Attending: Cardiology | Admitting: Cardiology

## 2018-03-28 ENCOUNTER — Encounter (HOSPITAL_COMMUNITY): Payer: Self-pay | Admitting: Cardiology

## 2018-03-28 VITALS — BP 110/70 | HR 70 | Wt 203.2 lb

## 2018-03-28 DIAGNOSIS — I493 Ventricular premature depolarization: Secondary | ICD-10-CM

## 2018-03-28 DIAGNOSIS — I5022 Chronic systolic (congestive) heart failure: Secondary | ICD-10-CM | POA: Diagnosis not present

## 2018-03-28 LAB — BASIC METABOLIC PANEL
Anion gap: 14 (ref 5–15)
BUN: 22 mg/dL (ref 8–23)
CO2: 22 mmol/L (ref 22–32)
Calcium: 9.2 mg/dL (ref 8.9–10.3)
Chloride: 100 mmol/L (ref 98–111)
Creatinine, Ser: 1.44 mg/dL — ABNORMAL HIGH (ref 0.61–1.24)
GFR calc non Af Amer: 52 mL/min — ABNORMAL LOW (ref >60)
GFR, EST AFRICAN AMERICAN: 60 mL/min — AB (ref >60)
GLUCOSE: 97 mg/dL (ref 70–99)
Potassium: 4.6 mmol/L (ref 3.5–5.1)
Sodium: 136 mmol/L (ref 135–145)

## 2018-03-28 LAB — CBC
HCT: 36.5 % — ABNORMAL LOW (ref 39.0–52.0)
Hemoglobin: 11.4 g/dL — ABNORMAL LOW (ref 13.0–17.0)
MCH: 26.8 pg (ref 26.0–34.0)
MCHC: 31.2 g/dL (ref 30.0–36.0)
MCV: 85.9 fL (ref 80.0–100.0)
Platelets: 256 10*3/uL (ref 150–400)
RBC: 4.25 MIL/uL (ref 4.22–5.81)
RDW: 12.8 % (ref 11.5–15.5)
WBC: 10.6 10*3/uL — ABNORMAL HIGH (ref 4.0–10.5)
nRBC: 0 % (ref 0.0–0.2)

## 2018-03-28 LAB — MAGNESIUM: Magnesium: 2.2 mg/dL (ref 1.7–2.4)

## 2018-03-28 NOTE — Patient Instructions (Signed)
Labs were done today. We will call you with any ABNORMAL results. No news is good news!  EKG was completed.  Your physician recommends that you schedule a follow-up appointment in: June of 2020, this appointment has been scheduled with you.

## 2018-03-30 NOTE — Progress Notes (Signed)
Patient ID: Terry [name], male   DOB: Mar 21, 1955, 63 y.o.   MRN: [id number] Primary Care: [name], MD Primary Cardiologist: Armanda Magic, MD HF Cardiologist: Dr [name]  HPI: Terry [name] is a 63 y.o. 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 Back in 2006, an echo showed EF 15-20%.  LHC at that time showed normal coronaries.  He has been managed for cardiomyopathy since that time. In 12/14, EF had improved to 50%.  However, he has had a decline since then.  Last echo in 5/16 showed EF 25%.  Atrial fibrillation was first noted in 2013.  In 7/15, he had DCCV to NSR.  In 1/16, he was noted to be back in atrial fibrillation and appears to have been in atrial fibrillation since that time. He had St Jude ICD placed by Dr [name] in 6/16.  Given fall in EF, Lexiscan Cardiolite was done. This showed EF 39% with partially reversible inferior and apical perfusion defect. He had LHC in 7/16 showing no significant CAD.  In 8/16, he was admitted for Tikosyn initiation and converted to NSR.  Echo in 8/18 showed EF 35-40%, mild to moderately decreased RV systolic function.  Echo in 8/19 showed EF 40-45%, mild-moderate MR, mild to moderately decreased RV systolic function.   Holter in 8/19 showed 8% PVCs.   He had right TKR in 10/19 without complications.   He presents today for followup of CHF.  He is doing well symptomatically.  No exertional dyspnea, no orthopnea or PND.  No lightheadedness or palpitations.  No chest pain.  He is having significant right elbow pain from RA.  Weight is stable.  Planning to go to [place] in May.    St Jude device interrogation: Stable thoracic impedance, no VT.    ECG (personally reviewed): NSR, LAFB, PVCs, QTc 490 msec  HIV negative, ferritin normal, immunofixation with no monoclonal protein.  Labs (6/16): K 4.2, Creatinine 1.08, HCT 37.5 Labs (08/18/14): K 4.2, Creatinine 1.0 Labs (8/16):  K 4.7, creatinine 1.18 Labs (9/16): Mg 2.1, creatinine 1.16, K 4.6 Labs (6/18): K 4.4, creatinine 1.25 Labs (9/18): K 4.4, creatinine 1.24 Labs (12/18): K 4.1, creatinine 1.2, hgb 11.6 Labs (10/19): K 4.3, creatinine 1.0  PMH 1. Nonischemic dilated cardiomyopathy: Echo (2006) with EF 15-20%.  LHC in 10/06 showed normal coronaries.  By 2014, EF had improved to 50%.  Echo in 1/16 showed EF 35-40%.  Echo (5/16) showed EF 25% with mild MR.  St Jude ICD 6/16.  HIV negative, immunofixation with no monoclonal protein, and ferritin normal.  Lexiscan Cardiolite (7/16) with EF 39% and partially reversible inferior and apical perfusion defect (intermediate risk).   LHC (7/16) with EF 35-40%, no significant CAD.  - Echo (8/17): EF 45-50% - Echo (8/18): EF 35-40%, moderate diastoilc dysfunction, mild AI, mild MR, mild to moderately decreased RV systolic function.   - Echo (8/19): EF 40-45%, mild-moderate MR, mild to moderately decreased RV systolic function.  2. Atrial fibrillation: Paroxysmal.  First noted in 2013.  DCCV in 7/15.  Back in atrial fibrillation 1/16.  Converted back to NSR with Tikosyn in 8/16.  3. HTN 4. Rheumatoid Arthritis: Has been off all medications for several years.   5. H/o TKR, h/o THR 6. Gynecomastia with spironolactone.  7. PVCs: Holter in 8/19 showed 8% PVCs.   SH: Lives at home with wife and 2 children and a grandchild. Never  smoker, drinks 1 or 2 beers a week with dinner. 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   FH: Father with MI at 49, brother with atrial fibrillation, mother with renal failure.   Review of systems complete and found to be negative unless listed in HPI.    Current Outpatient Medications  Medication Sig Dispense Refill  . carvedilol (COREG) 25 MG tablet Take 1 tablet (25 mg total) by mouth 2 (two) times daily. 180 tablet 3  . dofetilide (TIKOSYN) 250 MCG capsule TAKE 1 CAPSULE (250 MCG TOTAL) BY MOUTH 2 (TWO) TIMES DAILY. 60  capsule 11  . ENTRESTO 49-51 MG TAKE 1 TABLET BY MOUTH TWICE A DAY 180 tablet 1  . eplerenone (INSPRA) 50 MG tablet Take 1 tablet (50 mg total) by mouth daily. 90 tablet 3  . furosemide (LASIX) 40 MG tablet Take 60 mg qod and then 40 mg qod (Patient taking differently: Take 40-60 mg by mouth See admin instructions. Alternate between 40 mg and 60 mg daily) 135 tablet 3  . methocarbamol (ROBAXIN) 500 MG tablet Take 1 tablet (500 mg total) by mouth every 6 (six) hours as needed for muscle spasms. 40 tablet 0  . rivaroxaban (XARELTO) 20 MG TABS tablet Take 1 tablet (20 mg total) by mouth daily with supper. 90 tablet 3   No current facility-administered medications for this encounter.    Allergies  Allergen Reactions  . Spironolactone     Gynecomastia  . Sulfa Antibiotics     Thrush and hives   Vitals:   03/28/18 1058  BP: 110/70  Pulse: 70  SpO2: 96%  Weight: 92.2 kg (203 lb 3.2 oz)    Wt Readings from Last 3 Encounters:  03/28/18 92.2 kg (203 lb 3.2 oz)  12/01/17 97.4 kg (214 lb 11.7 oz)  11/23/17 89.4 kg (197 lb)    PHYSICAL EXAM: General: NAD Neck: No JVD, no thyromegaly or thyroid nodule.  Lungs: Clear to auscultation bilaterally with normal respiratory effort. CV: Nondisplaced PMI.  Heart regular S1/S2, no S3/S4, no murmur.  No peripheral edema.  No carotid bruit.  Normal pedal pulses.  Abdomen: Soft, nontender, no hepatosplenomegaly, no distention.  Skin: Intact without lesions or rashes.  Neurologic: Alert and oriented x 3.  Psych: Normal affect. Extremities: No clubbing or cyanosis.  HEENT: Normal.    ASSESSMENT & PLAN: 1. Chronic systolic HF: Nonischemic cardiomyopathy x years.  He has a Secondary school teacher ICD and is not a candidate for CRT due to narrow QRS.  7/16 LHC showed no significant CAD.  Most recent echo in 8/19 with EF 40-45%, mild to moderate RV systolic dysfunction.  NYHA class I-II symptoms, not volume overloaded by exam or Corevue.  - Continue Lasix 60 mg daily  alternating with 40 mg daily.  - Continue Coreg 25 mg BID - I would like to increase Entresto to 97/103 bid.  He just got a refill and wants to finish this before increasing.  Will plan to up the dose at next appointment.  - Gynecomastia resolved off spironolactone.  Continue eplerenone 50 mg daily. BMET today.  2.  Atrial fibrillation: Paroxysmal.  Holding NSR on Tikosyn. QTc ok on today's ECG.  - BMET and Mg today.  - Continue Xarelto 20 mg daily. CBC today.  3. PVCs: Holter in 8/19 showed 8% PVCs.  This is unlikely to affect his cardiomyopathy.    Followup in 6/20.   Marca Ancona, MD  03/30/2018

## 2018-04-15 MED ORDER — CARVEDILOL 25 MG PO TABS: 25.0000 mg | ORAL_TABLET | Freq: Two times a day (BID) | ORAL | 3 refills | Status: DC

## 2018-04-15 MED ORDER — FUROSEMIDE 40 MG PO TABS: 40.0000 mg | ORAL_TABLET | ORAL | 3 refills | Status: DC

## 2018-04-15 NOTE — Telephone Encounter (Signed)
Patient seen by Dr. [name] and Dr. Mayford Knife. Recent labs are normal. Refill sent in.

## 2018-04-17 DIAGNOSIS — J310 Chronic rhinitis: Secondary | ICD-10-CM | POA: Diagnosis not present

## 2018-04-30 ENCOUNTER — Other Ambulatory Visit: Payer: Self-pay | Admitting: Cardiology

## 2018-05-06 ENCOUNTER — Encounter (HOSPITAL_COMMUNITY): Payer: Self-pay

## 2018-05-06 ENCOUNTER — Other Ambulatory Visit (HOSPITAL_COMMUNITY): Payer: Self-pay

## 2018-05-06 ENCOUNTER — Other Ambulatory Visit (HOSPITAL_COMMUNITY): Payer: Self-pay | Admitting: *Deleted

## 2018-05-06 MED ORDER — EPLERENONE 50 MG PO TABS: 50.0000 mg | ORAL_TABLET | Freq: Every day | ORAL | 3 refills | Status: DC

## 2018-05-08 ENCOUNTER — Other Ambulatory Visit: Payer: Self-pay | Admitting: Internal Medicine

## 2018-05-10 ENCOUNTER — Telehealth: Payer: Self-pay

## 2018-05-10 NOTE — Telephone Encounter (Signed)
TELEPHONE CALL NOTE  This patient has been deemed a candidate for follow-up tele-health visit to limit community exposure during the Covid-19 pandemic. I spoke with the patient via phone to discuss instructions. This has been outlined on the patient's AVS (dotphrase: hcevisitinfo). The patient was advised to review the section on consent for treatment as well. The patient will receive a phone call 2-3 days prior to their E-Visit at which time consent will be verbally confirmed. A Virtual Office Visit appointment type has been scheduled for 10 am on 05/14/18 with Dr. Mayford Knife.  [name], RN 05/10/2018 10:25 AM    In the setting of the current Covid19 crisis, you are scheduled for a (phone or video) visit with your provider on (date) at (time).  Just as we do with many in-office visits, in order for you to participate in this visit, we must obtain consent.  If you'd like, I can send this to your mychart (if signed up) or email for you to review.  Otherwise, I can obtain your verbal consent now.  All virtual visits are billed to your insurance company just like a normal visit would be.  By agreeing to a virtual visit, we'd like you to understand that the technology does not allow for your provider to perform an examination, and thus may limit your provider's ability to fully assess your condition.  Finally, though the technology is pretty good, we cannot assure that it will always work on either your or our end, and in the setting of a video visit, we may have to convert it to a phone-only visit.  In either situation, we cannot ensure that we have a secure connection.  Are you willing to proceed?  TELEPHONE CALL NOTE  Terry [name] has been deemed a candidate for a follow-up tele-health visit to limit community exposure during the Covid-19 pandemic. I spoke with the patient via phone to ensure availability of phone/video source, confirm preferred email & phone number, and discuss instructions and  expectations.  I reminded Terry [name] to be prepared with any vital sign and/or heart rhythm information that could potentially be obtained via home monitoring, at the time of his visit. I reminded Terry [name] to expect an e-mail containing a link for their video-based visit approximately 15 minutes before his visit, or alternatively, a phone call at the time of his visit if his visit is planned to be a phone encounter.  STAFF MUST READ CONSENT VERBATIM TO PATIENT BELOW - Did the patient verbally consent to treatment as below? YES  [name], RN 05/10/2018 10:26 AM  DOWNLOADING THE SOFTWARE (If applicable)  Download the [employer] app to enable video and telephone visits with your [facility] Provider.   Instructions for downloading [employer] WebEx: - Go to https://www.webex.com/downloads.html and follow the instructions - If you have technical difficulties with downloading WebEx, please call WebEx at [phone]. - Once the app is downloaded (can be done on either mobile or desktop computer), go to Settings in the upper left hand corner.  Be sure that camera and audio are enabled.  - You will receive an email message with a link to the meeting with a time to join for your tele-health visit.  - Please download the app and have settings configured prior to the appointment time.    CONSENT FOR TELE-HEALTH VISIT - PLEASE REVIEW  I hereby voluntarily request, consent and authorize [facility] and its employed or contracted physicians, Producer, television/film/video, nurse  practitioners or other licensed health care professionals (the Practitioner), to provide me with telemedicine health care services (the "Services") as deemed necessary by the treating Practitioner. I acknowledge and consent to receive the Services by the Practitioner via telemedicine. I understand that the telemedicine visit will involve communicating with the Practitioner through live audiovisual communication technology and  the disclosure of certain medical information by electronic transmission. I acknowledge that I have been given the opportunity to request an in-person assessment or other available alternative prior to the telemedicine visit and am voluntarily participating in the telemedicine visit.  I understand that I have the right to withhold or withdraw my consent to the use of telemedicine in the course of my care at any time, without affecting my right to future care or treatment, and that the Practitioner or I may terminate the telemedicine visit at any time. I understand that I have the right to inspect all information obtained and/or recorded in the course of the telemedicine visit and may receive copies of available information for a reasonable fee.  I understand that some of the potential risks of receiving the Services via telemedicine include:  Marland Kitchen Delay or interruption in medical evaluation due to technological equipment failure or disruption; . Information transmitted may not be sufficient (e.g. poor resolution of images) to allow for appropriate medical decision making by the Practitioner; and/or  . In rare instances, security protocols could fail, causing a breach of personal health information.  Furthermore, I acknowledge that it is my responsibility to provide information about my medical history, conditions and care that is complete and accurate to the best of my ability. I acknowledge that Practitioner's advice, recommendations, and/or decision may be based on factors not within their control, such as incomplete or inaccurate data provided by me or distortions of diagnostic images or specimens that may result from electronic transmissions. I understand that the practice of medicine is not an exact science and that Practitioner makes no warranties or guarantees regarding treatment outcomes. I acknowledge that I will receive a copy of this consent concurrently upon execution via email to the email address I  last provided but may also request a printed copy by calling the office of [facility].    I understand that my insurance will be billed for this visit.   I have read or had this consent read to me. . I understand the contents of this consent, which adequately explains the benefits and risks of the Services being provided via telemedicine.  . I have been provided ample opportunity to ask questions regarding this consent and the Services and have had my questions answered to my satisfaction. . I give my informed consent for the services to be provided through the use of telemedicine in my medical care  By participating in this telemedicine visit I agree to the above.

## 2018-05-13 NOTE — Progress Notes (Addendum)
Virtual Visit via Video Note    Evaluation Performed:  Follow-up visit  This visit type was conducted due to national recommendations for restrictions regarding the COVID-19 Pandemic (e.g. social distancing).  This format is felt to be most appropriate for this patient at this time.  All issues noted in this document were discussed and addressed.  No physical exam was performed (except for noted visual exam findings with Video Visits).  Please refer to the patient's chart (MyChart message for video visits and phone note for telephone visits) for the patient's consent to telehealth for [facility].  Date:  05/13/2018   ID:  Terry [name], DOB 10-Jan-1956, MRN [id number]  Patient Location:  Home  Provider location:   [place]  PCP:  [name], MD  Cardiologist: Armanda Magic, MD/Dalton [name], MD Electrophysiologist: Lewayne Bunting, MD   Chief Complaint:  Chronic systolic CHF HTN, PAF, and nonischemic DCM.  History of Present Illness:    Terry [name] is a 63 y.o. male who presents via audio/video conferencing for a telehealth visit today.    Terry [name] is a 63 y.o. 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 Back in 2006, an echo showed EF 15-20%.  LHC at that time showed normal coronaries.  He has been managed for cardiomyopathy since that time. In 12/14, EF had improved to 50%.  However, he has had a decline since then.  Last echo in 5/16 showed EF 25%.  Atrial fibrillation was first noted in 2013.  In 7/15, he had DCCV to NSR.  In 1/16, he was noted to be back in atrial fibrillation and appears to have been in atrial fibrillation since that time. He had St Jude ICD placed by Dr [name] in 6/16.  Given fall in EF, Lexiscan Cardiolite was done. This showed EF 39% with partially reversible inferior and apical perfusion defect. He had LHC in 7/16 showing no significant CAD.  In 8/16, he was admitted  for Tikosyn initiation and converted to NSR.  Echo in 8/18 showed EF 35-40%, mild to moderately decreased RV systolic function.    Echo in 8/19 showed EF 40-45%, mild-moderate MR, mild to moderately decreased RV systolic function, mild to moderate MR and dilated aortic root at 40mm and ascending aorta 43mm.  Holter showed 8% PVCs by tele monitor.  He is followed by EP for his ICD. Workup for DCM included HIV (neg), ferritin (normal) and no monoclonal Ab on SPEP/UPEP.   He is followed as well by AHF service.  He is here today for followup by virtual televisit and is doing well.  He denies any chest pain or pressure, SOB, DOE, PND, orthopnea, LE edema, dizziness, palpitations or syncope. He is compliant with his meds and is tolerating meds with no SE.    The patient does not symptoms concerning for COVID-19 infection (fever, chills, cough, or new shortness of breath).    Prior CV studies:   The following studies were reviewed today:  2D echo 09/2017  Past Medical History:  Diagnosis Date   AICD (automatic cardioverter/defibrillator) present    Anemia    "S/P knee & hip OR"   Aortic insufficiency    mild    Aortic root dilatation (HCC)    Atrial fibrillation (HCC)    "off and on since 2013" (08/05/2014)   Chronic systolic CHF (congestive heart failure), NYHA class 1 (HCC)    Complication  of anesthesia    hx of irregular beat after anesthesia in ~ 1990's   Dysrhythmia    Ejection fraction < 50%    by echo 08/25/11    GERD (gastroesophageal reflux disease)    H/O hematuria    H/O hiatal hernia    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    JOINT REPLACEMENT     NASAL SEPTUM SURGERY  ~ 1975   TONSILLECTOMY     TOTAL HIP ARTHROPLASTY  09/08/2011   Procedure: TOTAL HIP ARTHROPLASTY ANTERIOR APPROACH;  Surgeon: [name], MD;  Location: [facility] ORS;  Service: Orthopedics;  Laterality: Right;  Right total hip replacement, left knee steroid injection   TOTAL KNEE ARTHROPLASTY Left 11/29/2012   Procedure: LEFT TOTAL KNEE ARTHROPLASTY;  Surgeon: [name], MD;  Location: [facility] ORS;  Service: Orthopedics;  Laterality: Left;  FEMORAL NERVE BLOCK IN HOLDING AREA LEFT LEG   TOTAL KNEE ARTHROPLASTY Right 11/30/2017   Procedure: RIGHT TOTAL KNEE ARTHROPLASTY;  Surgeon: [name], MD;  Location: [facility] ORS;  Service: Orthopedics;  Laterality: Right;   URETHRAL FISTULA REPAIR  ~ 1996   URETHRAL STRICTURE DILATATION  "several times"     No outpatient medications have been marked as taking for the 05/14/18 encounter (Appointment) with Quintella Reichert, MD.     Allergies:   Spironolactone and Sulfa antibiotics   Social History   Tobacco Use   Smoking status: Never Smoker   Smokeless tobacco: Never Used  Substance Use Topics   Alcohol use: Yes    Alcohol/week: 1.0 standard drinks    Types: 1 Cans of beer per week   Drug use: No     Family Hx: The patient's family  history includes Heart attack (age of onset: 36) in his father; Heart disease in his father; Heart failure in his brother.  ROS:   Please see the history of present illness.     All other systems reviewed and are negative.   Labs/Other Tests and Data Reviewed:    Recent Labs: 12/01/2017: ALT 7; TSH 1.195 03/28/2018: BUN 22; Creatinine, Ser 1.44; Hemoglobin 11.4; Magnesium 2.2; Platelets 256; Potassium 4.6; Sodium 136   Recent Lipid Panel No results found for: CHOL, TRIG, HDL, CHOLHDL, LDLCALC, LDLDIRECT  Wt Readings from Last 3 Encounters:  03/28/18 203 lb 3.2 oz (92.2 kg)  12/01/17 214 lb 11.7 oz (97.4 kg)  11/23/17 197 lb (89.4 kg)  Exam:    Vital Signs:  There were no vitals taken for this visit.   Well nourished, well developed male in no acute distress. Well appearing, alert and conversant, regular work of breathing,  good skin color  Eyes- anicteric mouth- oral mucosa is pink  neuro- grossly intact skin- no apparent rash or lesions or cyanosis    ASSESSMENT & PLAN:    1.  Chronic systolic CHF - he denies any SOB, DOE, PND, orthopnea or LE edema.  His last device check showed normal Corveue.  He has stable NHYA Class I symptoms.  He remains very active working out in his yard.  He did not tolerate spiro due to gynecomastia and is now on Eplerenone 50mg  daily.  He will continue on Carvedilol.  Dr. [name] wanted to increase his Entresto to 97-103mg  BID oce he finished his current bottle of the lower dose.  Due to the COVID 19 crisis and increased risk due to comorbidities for going out to MD offices I will hold off on uptitration of Entresto for now as he would need a BMET 1 week later.  He will see Dr. [name] in June and it can be done at that time.  His current dose of Lasix 60mg  qod alternating with Lasix 40mg  qod seems to be controlling volume overload.   2.  HTN - BP is monitored at home and is controlled.  He will continue on Carvedilol 25mg  BID.   3.  Paroxysmal  atrial fibrillation - he thinks he is in NSR.  He has not had any palpitations.  He will continue on Carvedilol and Tikosyn.  He is on Xarelto for a CHADS2VASC score of 3 (HTN, CHF and atherosclerosis of the aorta).  He has not had any problems with excessive bleeding. Creatinine was mildly elevated but stable at 1.44 on 03/28/2018 and Hbg stable at 11.4.  His last Mag level was stable at 2.2 and K 4.6.  4.  PVCs -  Holter monitor showed 8% PVC load and therefore likely not the etiology of his DCM.  5.  Nonischemic DCM - his most recent echo 09/2017 showed an EF of 40-45%.  Workup for secondary causes were negative (HIV/monoclonal Ab/normal ferritin).  Cardiac cath 08/2014 with no CAD.  S/P AICD for primary prevention followed in device clinic. Not a candidate for CRT due to narrow QRS.  COVID-19 Education: The signs and symptoms of COVID-19 were discussed with the patient and how to seek care for testing (follow up with PCP or arrange E-visit).  The importance of social distancing was discussed today.  Patient Risk:   After full review of this patients clinical status, I feel that they are at least moderate risk at this time.  Time:   Today, I have spent 25 minutes with the patient with telehealth technology discussing CHF, HTN, afib, healthy lifestyle with ongoing exercise and staying active during the Covid crisis.   Medication Adjustments/Labs and Tests Ordered: Current medicines are reviewed at length with the patient today.  Concerns regarding medicines are outlined above.  Tests Ordered: No orders of the defined types were placed in this encounter.  Medication Changes: No orders of the defined types were placed in this encounter.   Disposition:  Follow up in 5 month(s)  Signed, Armanda Magic, MD  05/13/2018 7:59 PM    [facility]

## 2018-05-14 ENCOUNTER — Other Ambulatory Visit: Payer: Self-pay

## 2018-05-14 ENCOUNTER — Encounter: Payer: Self-pay | Admitting: Cardiology

## 2018-05-14 ENCOUNTER — Encounter

## 2018-05-14 ENCOUNTER — Telehealth (INDEPENDENT_AMBULATORY_CARE_PROVIDER_SITE_OTHER): Payer: BC Managed Care – PPO | Admitting: Cardiology

## 2018-05-14 VITALS — BP 110/70 | HR 66 | Wt 202.0 lb

## 2018-05-14 DIAGNOSIS — Z79899 Other long term (current) drug therapy: Secondary | ICD-10-CM

## 2018-05-14 DIAGNOSIS — I480 Paroxysmal atrial fibrillation: Secondary | ICD-10-CM | POA: Diagnosis not present

## 2018-05-14 DIAGNOSIS — I493 Ventricular premature depolarization: Secondary | ICD-10-CM

## 2018-05-14 DIAGNOSIS — I420 Dilated cardiomyopathy: Secondary | ICD-10-CM | POA: Diagnosis not present

## 2018-05-14 DIAGNOSIS — I5022 Chronic systolic (congestive) heart failure: Secondary | ICD-10-CM

## 2018-05-14 DIAGNOSIS — I10 Essential (primary) hypertension: Secondary | ICD-10-CM

## 2018-05-14 DIAGNOSIS — I110 Hypertensive heart disease with heart failure: Secondary | ICD-10-CM | POA: Diagnosis not present

## 2018-05-14 NOTE — Patient Instructions (Signed)
Medication Instructions:  Your physician recommends that you continue on your current medications as directed. Please refer to the Current Medication list given to you today.  If you need a refill on your cardiac medications before your next appointment, please call your pharmacy.   Lab work: None If you have labs (blood work) drawn today and your tests are completely normal, you will receive your results only by: . MyChart Message (if you have MyChart) OR . A paper copy in the mail If you have any lab test that is abnormal or we need to change your treatment, we will call you to review the results.  Testing/Procedures: None  Follow-Up: At [facility], you and your health needs are our priority.  As part of our continuing mission to provide you with exceptional heart care, we have created designated Provider Care Teams.  These Care Teams include your primary Cardiologist (physician) and Advanced Practice Providers (APPs -  Physician Assistants and Nurse Practitioners) who all work together to provide you with the care you need, when you need it. You will need a follow up appointment in 6 months.  Please call our office 2 months in advance to schedule this appointment.  You may see Dr. Turner or one of the following Advanced Practice Providers on your designated Care Team:   Brittainy Simmons, PA-C Dayna Dunn, PA-C . Michele Lenze, PA-C     

## 2018-05-15 DIAGNOSIS — H6123 Impacted cerumen, bilateral: Secondary | ICD-10-CM | POA: Diagnosis not present

## 2018-06-10 ENCOUNTER — Ambulatory Visit (INDEPENDENT_AMBULATORY_CARE_PROVIDER_SITE_OTHER): Payer: BC Managed Care – PPO | Admitting: *Deleted

## 2018-06-10 ENCOUNTER — Other Ambulatory Visit: Payer: Self-pay

## 2018-06-10 DIAGNOSIS — I5022 Chronic systolic (congestive) heart failure: Secondary | ICD-10-CM

## 2018-06-10 DIAGNOSIS — I420 Dilated cardiomyopathy: Secondary | ICD-10-CM

## 2018-06-11 LAB — CUP PACEART REMOTE DEVICE CHECK
Battery Remaining Longevity: 73 mo
Battery Remaining Percentage: 70 %
Battery Voltage: 2.93 V
Brady Statistic RV Percent Paced: 1 %
Date Time Interrogation Session: 20200427060015
HighPow Impedance: 61 Ohm
HighPow Impedance: 61 Ohm
Implantable Lead Implant Date: 20160622
Implantable Lead Location: 753860
Implantable Lead Model: 181
Implantable Lead Serial Number: 331169
Implantable Pulse Generator Implant Date: 20160622
Lead Channel Impedance Value: 380 Ohm
Lead Channel Pacing Threshold Amplitude: 0.75 V
Lead Channel Pacing Threshold Pulse Width: 0.5 ms
Lead Channel Sensing Intrinsic Amplitude: 6.7 mV
Lead Channel Setting Pacing Amplitude: 2.5 V
Lead Channel Setting Pacing Pulse Width: 0.5 ms
Lead Channel Setting Sensing Sensitivity: 0.5 mV
Pulse Gen Serial Number: 7280026

## 2018-06-18 NOTE — Progress Notes (Signed)
Remote ICD transmission.   

## 2018-07-17 ENCOUNTER — Other Ambulatory Visit: Payer: Self-pay

## 2018-07-17 ENCOUNTER — Ambulatory Visit (INDEPENDENT_AMBULATORY_CARE_PROVIDER_SITE_OTHER): Payer: BC Managed Care – PPO | Admitting: Orthopaedic Surgery

## 2018-07-17 ENCOUNTER — Encounter: Payer: Self-pay | Admitting: Orthopaedic Surgery

## 2018-07-17 DIAGNOSIS — Z96651 Presence of right artificial knee joint: Secondary | ICD-10-CM

## 2018-07-17 NOTE — Progress Notes (Signed)
HPI: [name] returns today now 8 months status post right total knee arthroplasty.  His right knee is overall doing well.  He has no complaints.  He has gone back to swimming at this point.  Review of systems: No fevers chills.  Please see HPI otherwise negative or noncontributory.  Physical exam: Right knee full extension flexion to 110 to 115 degrees.  No instability valgus varus stressing.  Surgical incisions well-healed.  Calf supple nontender.   Impression: Status post right total knee arthroplasty 8 months  Plan: We will see him back in 4 months at that time obtain AP and lateral views of the right knee.  He can continue work on range of motion strengthening the knee.  Questions encouraged and answered by Dr. Magnus Ivan and myself.

## 2018-07-18 ENCOUNTER — Telehealth (HOSPITAL_COMMUNITY): Payer: Self-pay | Admitting: Cardiology

## 2018-07-18 NOTE — Telephone Encounter (Signed)
Called and left message that d/t Covid restrictions at [facility], we are cancelling pt's appt on 07/23/2018 with Dr. [name] and would add his name to our waitlist.  Advised pt if he is having problems now or in the future to call our office and choose option 2 to leave a message for the Nurse.

## 2018-07-23 ENCOUNTER — Encounter (HOSPITAL_COMMUNITY): Payer: BC Managed Care – PPO | Admitting: Cardiology

## 2018-09-09 ENCOUNTER — Ambulatory Visit (INDEPENDENT_AMBULATORY_CARE_PROVIDER_SITE_OTHER): Payer: BC Managed Care – PPO | Admitting: *Deleted

## 2018-09-09 DIAGNOSIS — I420 Dilated cardiomyopathy: Secondary | ICD-10-CM

## 2018-09-09 DIAGNOSIS — I480 Paroxysmal atrial fibrillation: Secondary | ICD-10-CM

## 2018-09-10 LAB — CUP PACEART REMOTE DEVICE CHECK
Battery Remaining Longevity: 68 mo
Battery Remaining Percentage: 68 %
Battery Voltage: 2.93 V
Brady Statistic RV Percent Paced: 1 %
Date Time Interrogation Session: 20200727060024
HighPow Impedance: 66 Ohm
HighPow Impedance: 66 Ohm
Implantable Lead Implant Date: 20160622
Implantable Lead Location: 753860
Implantable Lead Model: 181
Implantable Lead Serial Number: 331169
Implantable Pulse Generator Implant Date: 20160622
Lead Channel Impedance Value: 390 Ohm
Lead Channel Pacing Threshold Amplitude: 0.75 V
Lead Channel Pacing Threshold Pulse Width: 0.5 ms
Lead Channel Sensing Intrinsic Amplitude: 6.8 mV
Lead Channel Setting Pacing Amplitude: 2.5 V
Lead Channel Setting Pacing Pulse Width: 0.5 ms
Lead Channel Setting Sensing Sensitivity: 0.5 mV
Pulse Gen Serial Number: 7280026

## 2018-09-16 ENCOUNTER — Telehealth: Payer: Self-pay | Admitting: Internal Medicine

## 2018-09-16 NOTE — Telephone Encounter (Signed)
See phone note from 09/16/18.

## 2018-09-16 NOTE — Telephone Encounter (Signed)
Explained to pt that Dr. Lovena Le has already reviewed his remote transmission and did not want to make any changes.

## 2018-09-16 NOTE — Telephone Encounter (Signed)
New Message  Patient calling in due to having concerns/questions about getting results of 1 NST with 9 beats. Patient would like a call back to discuss.

## 2018-09-25 NOTE — Progress Notes (Signed)
Remote ICD transmission.   

## 2018-10-09 ENCOUNTER — Encounter: Payer: Self-pay | Admitting: Cardiology

## 2018-10-09 ENCOUNTER — Telehealth: Payer: Self-pay

## 2018-10-09 NOTE — Progress Notes (Signed)
Virtual Visit via Video Note   This visit type was conducted due to national recommendations for restrictions regarding the COVID-19 Pandemic (e.g. social distancing) in an effort to limit this patient's exposure and mitigate transmission in our community.  Due to his co-morbid illnesses, this patient is at least at moderate risk for complications without adequate follow up.  This format is felt to be most appropriate for this patient at this time.  All issues noted in this document were discussed and addressed.  A limited physical exam was performed with this format.  Please refer to the patient's chart for his consent to telehealth for [facility].  Evaluation Performed:  Follow-up visit  This visit type was conducted due to national recommendations for restrictions regarding the COVID-19 Pandemic (e.g. social distancing).  This format is felt to be most appropriate for this patient at this time.  All issues noted in this document were discussed and addressed.  No physical exam was performed (except for noted visual exam findings with Video Visits).  Please refer to the patient's chart (MyChart message for video visits and phone note for telephone visits) for the patient's consent to telehealth for [facility].  Date:  10/10/2018   ID:  Terry [name], DOB 1955-05-31, MRN [id number]  Patient Location:  Home  Provider location:   [place]  PCP:  [name], MD  Cardiologist:  Lewayne Bunting, MD / Armanda Magic, MD Electrophysiologist:  None   Chief Complaint:  CHF, HTN, PAF, DCM  History of Present Illness:    Terry [name] is a 63 y.o. male who presents via audio/video conferencing for a telehealth visit today.    Terry [name], HTN, and persistent AF.Back in 2006, an echo showed EF 15-20%. LHC at that time showed normal coronaries. He has been managed for  cardiomyopathy since that time. In 12/14, EF had improved to 50%. However, he then had a decline with echo in 5/16 showing EF 25%. Atrial fibrillation was first noted in 2013. In 7/15, he had DCCV to NSR. In 1/16, he was noted to be back in atrial fibrillation. He had St Jude ICD placed by Dr [name] in 6/16. Given fall in EF, Lexiscan Cardiolite was done. This showed EF 39% with partially reversible inferior and apical perfusion defect. He had LHC in 7/16 showing no significant CAD. In 8/16, he was admitted for Tikosyn initiation and converted to NSR. Echo in 8/18 showed EF 35-40%, mild to moderately decreased RV systolic function.  Echo in 8/19 showed EF 40-45%, mild-moderate MR, mild to moderately decreased RV systolic function, mild to moderate MR and dilated aortic root at 68mm and ascending aorta 44mm.  Holter showed 8% PVCs by tele monitor.  He is followed by EP for his ICD. Workup for DCM included HIV (neg), ferritin (normal) and no monoclonal Ab on SPEP/UPEP.   He is followed as well by AHF service.    He is here today for followup and is doing well.  He denies any chest pain or pressure, SOB, DOE, PND, orthopnea, LE edema, dizziness, palpitations or syncope. He is compliant with his meds and is tolerating meds with no SE.    The patient does not have symptoms concerning for COVID-19 infection (fever, chills, cough, or new shortness of breath).    Prior CV studies:   The following studies were reviewed today:  none  Past  Medical History:  Diagnosis Date  . AICD (automatic cardioverter/defibrillator) present   . Anemia    "S/P knee & hip OR"  . Aortic insufficiency    mild   . Aortic root dilatation (HCC)   . Chronic systolic CHF (congestive heart failure), NYHA class 1 (HCC)   . Complication of anesthesia    hx of irregular beat after anesthesia in ~ 1990's  . GERD (gastroesophageal reflux disease)   . H/O hematuria   . H/O hiatal hernia   . 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 History of blood transfusion 2013; 2014   S/P knee and hip OR  . Hypertension   . Nonischemic dilated cardiomyopathy (HCC)    EF 40-45% by echo 2019.  S/P AICD for primary prevention  . PAF (paroxysmal atrial fibrillation) (HCC)    "off and on since 2013" (08/05/2014)  . PVC (premature ventricular contraction)    PVC load 8%  . Rheumatoid arthritis (HCC) dx'd 1995   Past Surgical History:  Procedure Laterality Date  . CARDIAC CATHETERIZATION  ~ 2004   normal  . CARDIAC CATHETERIZATION N/A 09/07/2014   Procedure: Left Heart Cath and Coronary Angiography;  Surgeon: Laurey Morale, MD;  Location: [facility] INVASIVE CV LAB;  Service: Cardiovascular;  Laterality: N/A;  . CARDIAC DEFIBRILLATOR PLACEMENT  08/05/2014   St. Jude  . CARDIOVERSION N/A 08/21/2013   Procedure: CARDIOVERSION;  Surgeon: Quintella Reichert, MD;  Location: [facility] ENDOSCOPY;  Service: Cardiovascular;  Laterality: N/A;  . COLONOSCOPY  2010; 2015   "polyps; no polyps"  . EP IMPLANTABLE DEVICE N/A 08/05/2014   Procedure: ICD Implant;  Surgeon: [name], MD;  Location: [facility] INVASIVE CV LAB;  Service: Cardiovascular;  Laterality: N/A;  . 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 JOINT REPLACEMENT    . NASAL SEPTUM SURGERY  ~ 1975  . TONSILLECTOMY    . TOTAL HIP ARTHROPLASTY  09/08/2011   Procedure: TOTAL HIP ARTHROPLASTY ANTERIOR APPROACH;  Surgeon: [name], MD;  Location: [facility] ORS;  Service: Orthopedics;  Laterality: Right;  Right total hip replacement, left knee steroid injection  . TOTAL KNEE ARTHROPLASTY Left 11/29/2012   Procedure: LEFT TOTAL KNEE ARTHROPLASTY;  Surgeon: [name], MD;  Location: [facility];  Service: Orthopedics;  Laterality: Left;  FEMORAL NERVE BLOCK IN HOLDING AREA LEFT LEG  . TOTAL KNEE ARTHROPLASTY Right 11/30/2017   Procedure: RIGHT TOTAL KNEE ARTHROPLASTY;  Surgeon: [name], MD;  Location: [facility];  Service: Orthopedics;   Laterality: Right;  . URETHRAL FISTULA REPAIR  ~ 1996  . URETHRAL STRICTURE DILATATION  "several times"     No outpatient medications have been marked as taking for the 10/10/18 encounter (Telemedicine) with Quintella Reichert, MD.     Allergies:   Spironolactone and Sulfa antibiotics   Social History   Tobacco Use  . Smoking status: Never Smoker  . Smokeless tobacco: Never Used  Substance Use Topics  . Alcohol use: Yes    Alcohol/week: 1.0 standard drinks    Types: 1 Cans of beer per week  . Drug use: No     Family Hx: The patient's family history includes Heart attack (age of onset: 68) in his father; Heart disease in his father; Heart failure in his brother.  ROS:   Please see the history of present illness.     All other systems reviewed and are negative.   Labs/Other  Tests and Data Reviewed:    Recent Labs: 12/01/2017: ALT 7; TSH 1.195 03/28/2018: BUN 22; Creatinine, Ser 1.44; Hemoglobin 11.4; Magnesium 2.2; Platelets 256; Potassium 4.6; Sodium 136   Recent Lipid Panel No results found for: CHOL, TRIG, HDL, CHOLHDL, LDLCALC, LDLDIRECT  Wt Readings from Last 3 Encounters:  10/10/18 207 lb (93.9 kg)  05/14/18 202 lb (91.6 kg)  03/28/18 203 lb 3.2 oz (92.2 kg)     Objective:    Vital Signs:  BP 124/62   Pulse 65   Wt 207 lb (93.9 kg)   SpO2 97%   BMI 28.87 kg/m    CONSTITUTIONAL:  Well nourished, well developed male in no acute distress.  EYES: anicteric MOUTH: oral mucosa is pink RESPIRATORY: Normal respiratory effort, symmetric expansion CARDIOVASCULAR: No peripheral edema SKIN: No rash, lesions or ulcers MUSCULOSKELETAL: no digital cyanosis NEURO: Cranial Nerves II-XII grossly intact, moves all extremities PSYCH: Intact judgement and insight.  A&O x 3, Mood/affect appropriate   ASSESSMENT & PLAN:    1.  Chronic systolic CHF -  -He has stable NHYA Class I symptoms.   -He remains very active working out in his yard.   -He did not tolerate spiro due  to gynecomastia and is now on Eplerenone 50mg  daily.   -he has not had any SOB and LE edema is controlled -He will continue on Carvedilol 25mg  BID, Lasix 40 - 60mg  daily and eplerenone 50mg  daily. -His BP is stable with room to uptitrate Entresto to 97-103mg  BID.  I will check a BMET and if K+ and creatinine are stable will increase Entresto.  Repeat BMET again in 1 week after increase -repeat 2D echo in 2 months to see if further improvement in EF.  2.  HTN  - BP is monitored at home and is controlled.   -He will continue on Carvedilol 25mg  BID, Entresto and Eplerenone.  3.  Paroxysmal atrial fibrillation  -he thinks he is in NSR.   -He has not had any palpitations.   -He will continue on Carvedilol and Tikosyn.   -He is on Xarelto for a CHADS2VASC score of 3 (HTN, CHF and atherosclerosis of the aorta) and has not had any problems with excessive bleeding.  -Creatinine was mildly elevated but stable at 1.44 on 03/28/2018 and Hbg stable at 11.4.  His last Mag level was stable at 2.2 and K 4.6.  4.  PVCs  - Holter monitor showed 8% PVC load and therefore likely not the etiology of his DCM. -continue BB  5.  Nonischemic DCM  -his most recent echo 09/2017 showed an EF of 40-45%.  -Workup for secondary causes were negative (HIV/monoclonal Ab/normal ferritin).  -Cardiac cath 08/2014 with no CAD. -S/P AICD for primary prevention followed in device clinic by Dr. Ladona Ridgelaylor -Not a candidate for CRT due to narrow QRS.  COVID-19 Education: The signs and symptoms of COVID-19 were discussed with the patient and how to seek care for testing (follow up with PCP or arrange E-visit).  The importance of social distancing was discussed today.  Patient Risk:   After full review of this patient's clinical status, I feel that they are at least moderate risk at this time.  Time:   Today, I have spent 20 minutes directly with the patient on telemedicine discussing medical problems including CHF, DCM, PAF,  PVCs.  We also reviewed the symptoms of COVID 19 and the ways to protect against contracting the virus with telehealth technology.  I spent an additional 5  minutes reviewing patient's chart including 2D echo, labs.  Medication Adjustments/Labs and Tests Ordered: Current medicines are reviewed at length with the patient today.  Concerns regarding medicines are outlined above.  Tests Ordered: No orders of the defined types were placed in this encounter.  Medication Changes: No orders of the defined types were placed in this encounter.   Disposition:  Follow up in 6 month(s) with me virtual  Signed, Fransico Him, MD  10/10/2018 9:46 AM    [place]

## 2018-10-09 NOTE — Telephone Encounter (Signed)
Called pt and spoke with pt to review meds

## 2018-10-10 ENCOUNTER — Telehealth (INDEPENDENT_AMBULATORY_CARE_PROVIDER_SITE_OTHER): Payer: BC Managed Care – PPO | Admitting: Cardiology

## 2018-10-10 VITALS — BP 124/62 | HR 65 | Wt 207.0 lb

## 2018-10-10 DIAGNOSIS — I480 Paroxysmal atrial fibrillation: Secondary | ICD-10-CM

## 2018-10-10 DIAGNOSIS — I5022 Chronic systolic (congestive) heart failure: Secondary | ICD-10-CM

## 2018-10-10 DIAGNOSIS — I10 Essential (primary) hypertension: Secondary | ICD-10-CM | POA: Diagnosis not present

## 2018-10-10 DIAGNOSIS — I420 Dilated cardiomyopathy: Secondary | ICD-10-CM

## 2018-10-10 DIAGNOSIS — I77810 Thoracic aortic ectasia: Secondary | ICD-10-CM

## 2018-10-10 DIAGNOSIS — I493 Ventricular premature depolarization: Secondary | ICD-10-CM

## 2018-10-10 DIAGNOSIS — I4819 Other persistent atrial fibrillation: Secondary | ICD-10-CM

## 2018-10-14 ENCOUNTER — Other Ambulatory Visit: Payer: BC Managed Care – PPO

## 2018-10-14 ENCOUNTER — Other Ambulatory Visit: Payer: Self-pay

## 2018-10-14 DIAGNOSIS — I480 Paroxysmal atrial fibrillation: Secondary | ICD-10-CM

## 2018-10-14 LAB — CBC WITH DIFFERENTIAL/PLATELET
Basophils Absolute: 0.1 10*3/uL (ref 0.0–0.2)
Basos: 1 %
EOS (ABSOLUTE): 0.6 10*3/uL — ABNORMAL HIGH (ref 0.0–0.4)
Eos: 6 %
Hematocrit: 35.3 % — ABNORMAL LOW (ref 37.5–51.0)
Hemoglobin: 11.8 g/dL — ABNORMAL LOW (ref 13.0–17.7)
Immature Grans (Abs): 0 10*3/uL (ref 0.0–0.1)
Immature Granulocytes: 0 %
Lymphocytes Absolute: 2.3 10*3/uL (ref 0.7–3.1)
Lymphs: 25 %
MCH: 28.3 pg (ref 26.6–33.0)
MCHC: 33.4 g/dL (ref 31.5–35.7)
MCV: 85 fL (ref 79–97)
Monocytes Absolute: 0.9 10*3/uL (ref 0.1–0.9)
Monocytes: 9 %
Neutrophils Absolute: 5.6 10*3/uL (ref 1.4–7.0)
Neutrophils: 59 %
Platelets: 271 10*3/uL (ref 150–450)
RBC: 4.17 x10E6/uL (ref 4.14–5.80)
RDW: 13.5 % (ref 11.6–15.4)
WBC: 9.4 10*3/uL (ref 3.4–10.8)

## 2018-10-14 LAB — BASIC METABOLIC PANEL
BUN/Creatinine Ratio: 16 (ref 10–24)
BUN: 23 mg/dL (ref 8–27)
CO2: 24 mmol/L (ref 20–29)
Calcium: 9.4 mg/dL (ref 8.6–10.2)
Chloride: 100 mmol/L (ref 96–106)
Creatinine, Ser: 1.4 mg/dL — ABNORMAL HIGH (ref 0.76–1.27)
GFR calc Af Amer: 61 mL/min/{1.73_m2} (ref >59)
GFR calc non Af Amer: 53 mL/min/{1.73_m2} — ABNORMAL LOW (ref >59)
Glucose: 82 mg/dL (ref 65–99)
Potassium: 5.1 mmol/L (ref 3.5–5.2)
Sodium: 138 mmol/L (ref 134–144)

## 2018-10-22 ENCOUNTER — Telehealth: Payer: Self-pay | Admitting: Cardiology

## 2018-10-22 DIAGNOSIS — I5022 Chronic systolic (congestive) heart failure: Secondary | ICD-10-CM

## 2018-10-22 DIAGNOSIS — I10 Essential (primary) hypertension: Secondary | ICD-10-CM

## 2018-10-22 DIAGNOSIS — Z79899 Other long term (current) drug therapy: Secondary | ICD-10-CM

## 2018-10-22 DIAGNOSIS — Z5181 Encounter for therapeutic drug level monitoring: Secondary | ICD-10-CM

## 2018-10-22 MED ORDER — ENTRESTO 97-103 MG PO TABS: 1.0000 | ORAL_TABLET | Freq: Two times a day (BID) | ORAL | 3 refills | Status: DC

## 2018-10-22 NOTE — Telephone Encounter (Signed)
LMTCB

## 2018-10-22 NOTE — Telephone Encounter (Signed)
New message:    Patient returning call back. Please call patient.

## 2018-10-22 NOTE — Telephone Encounter (Signed)
Pt agreed to increase his Entresto per Dr. Theodosia Blender orders.. labs in one week.   Sueanne Margarita, MD   9:25 AM Increase Entresto to 97-103mg  BID and check BMET in 1 week

## 2018-10-28 ENCOUNTER — Other Ambulatory Visit: Payer: Self-pay | Admitting: Internal Medicine

## 2018-10-28 NOTE — Telephone Encounter (Signed)
41m 93.9kg Scr 1.4 10/14/18 ccr 71.84mlmin Lovw/turner 10/10/18

## 2018-10-30 ENCOUNTER — Other Ambulatory Visit: Payer: Medicare Other | Admitting: *Deleted

## 2018-10-30 ENCOUNTER — Other Ambulatory Visit: Payer: Self-pay

## 2018-10-30 DIAGNOSIS — I10 Essential (primary) hypertension: Secondary | ICD-10-CM | POA: Diagnosis not present

## 2018-10-30 DIAGNOSIS — I5022 Chronic systolic (congestive) heart failure: Secondary | ICD-10-CM | POA: Diagnosis not present

## 2018-10-30 DIAGNOSIS — Z5181 Encounter for therapeutic drug level monitoring: Secondary | ICD-10-CM

## 2018-10-30 DIAGNOSIS — Z79899 Other long term (current) drug therapy: Secondary | ICD-10-CM | POA: Diagnosis not present

## 2018-10-30 LAB — BASIC METABOLIC PANEL
BUN/Creatinine Ratio: 15 (ref 10–24)
BUN: 23 mg/dL (ref 8–27)
CO2: 24 mmol/L (ref 20–29)
Calcium: 9 mg/dL (ref 8.6–10.2)
Chloride: 96 mmol/L (ref 96–106)
Creatinine, Ser: 1.57 mg/dL — ABNORMAL HIGH (ref 0.76–1.27)
GFR calc Af Amer: 53 mL/min/{1.73_m2} — ABNORMAL LOW (ref >59)
GFR calc non Af Amer: 46 mL/min/{1.73_m2} — ABNORMAL LOW (ref >59)
Glucose: 123 mg/dL — ABNORMAL HIGH (ref 65–99)
Potassium: 4.9 mmol/L (ref 3.5–5.2)
Sodium: 135 mmol/L (ref 134–144)

## 2018-10-31 ENCOUNTER — Telehealth: Payer: Self-pay

## 2018-10-31 NOTE — Telephone Encounter (Signed)
The patient has been notified of the result and verbalized understanding.  All questions (if any) were answered. Wilma Flavin, RN 10/31/2018 8:13 AM

## 2018-11-05 DIAGNOSIS — I429 Cardiomyopathy, unspecified: Secondary | ICD-10-CM

## 2018-11-05 DIAGNOSIS — M05722 Rheumatoid arthritis with rheumatoid factor of left elbow without organ or systems involvement: Secondary | ICD-10-CM | POA: Diagnosis not present

## 2018-11-05 NOTE — Telephone Encounter (Signed)
Echocardiogram order placed in Epic. Phoned and left voice message for patient and emailed/MyChart requesting he call office to schedule Echocardiogram.

## 2018-11-06 NOTE — Telephone Encounter (Addendum)
Called patient, verified that Echo scheduled for 11/18/18 at 0920, he verbalizes understanding. Informed that entresto improves blood flow to the kidneys which can result in inreased urination. Denies further questions or concerns.

## 2018-11-08 ENCOUNTER — Ambulatory Visit (INDEPENDENT_AMBULATORY_CARE_PROVIDER_SITE_OTHER): Payer: BC Managed Care – PPO | Admitting: Internal Medicine

## 2018-11-08 ENCOUNTER — Encounter: Payer: Self-pay | Admitting: Internal Medicine

## 2018-11-08 ENCOUNTER — Other Ambulatory Visit: Payer: Self-pay

## 2018-11-08 VITALS — BP 110/70 | HR 61 | Ht 71.0 in | Wt 214.0 lb

## 2018-11-08 DIAGNOSIS — I480 Paroxysmal atrial fibrillation: Secondary | ICD-10-CM

## 2018-11-08 DIAGNOSIS — Z95810 Presence of automatic (implantable) cardiac defibrillator: Secondary | ICD-10-CM

## 2018-11-08 DIAGNOSIS — I493 Ventricular premature depolarization: Secondary | ICD-10-CM

## 2018-11-08 DIAGNOSIS — I5022 Chronic systolic (congestive) heart failure: Secondary | ICD-10-CM

## 2018-11-08 NOTE — Progress Notes (Signed)
HPI [name] returns today for followup of his ICD, implanted 3 yearsago. He is a pleasant 63yo man with atrial fibrillation, RA, s/p knee replacement and hip replacement,andchronic systolic heart failure.He underwent ICD implant and was then placed on Tikosyn. He has returned to NSR.Since we saw him last, hehas done well. He denies peripheral edema and has not been in the hospital since I saw him last. He has class 2A CHF symptoms. He is pending left elbow surgery in the coming weeks. No ICD shocks.  Allergies  Allergen Reactions  . Spironolactone     Gynecomastia  . Sulfa Antibiotics     Thrush and hives     Current Outpatient Medications  Medication Sig Dispense Refill  . carvedilol (COREG) 25 MG tablet Take 1 tablet (25 mg total) by mouth 2 (two) times daily. 180 tablet 3  . dofetilide (TIKOSYN) 250 MCG capsule TAKE 1 CAPSULE (250 MCG TOTAL) BY MOUTH 2 (TWO) TIMES DAILY. 180 capsule 3  . eplerenone (INSPRA) 50 MG tablet Take 1 tablet (50 mg total) by mouth daily. 90 tablet 3  . furosemide (LASIX) 40 MG tablet Take 1-1.5 tablets (40-60 mg total) by mouth See admin instructions. Alternate between 40 mg and 60 mg daily 180 tablet 3  . sacubitril-valsartan (ENTRESTO) 97-103 MG Take 1 tablet by mouth 2 (two) times daily. 180 tablet 3  . XARELTO 20 MG TABS tablet TAKE 1 TABLET BY MOUTH DAILY WITH SUPPER. 90 tablet 3   No current facility-administered medications for this visit.      Past Medical History:  Diagnosis Date  . AICD (automatic cardioverter/defibrillator) present   . Anemia    "S/P knee & hip OR"  . Aortic insufficiency    mild   . Aortic root dilatation (HCC)   . Chronic systolic CHF (congestive heart failure), NYHA class 1 (HCC)   . Complication of anesthesia    hx of irregular beat after anesthesia in ~ 1990's  . GERD (gastroesophageal reflux disease)   . H/O hematuria   . H/O hiatal hernia   . 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  History of blood transfusion 2013; 2014   S/P knee and hip OR  . Hypertension   . Nonischemic dilated cardiomyopathy (HCC)    EF 40-45% by echo 2019.  S/P AICD for primary prevention  . PAF (paroxysmal atrial fibrillation) (HCC)    "off and on since 2013" (08/05/2014)  . PVC (premature ventricular contraction)    PVC load 8%  . Rheumatoid arthritis (HCC) dx'd 1995    ROS:   All systems reviewed and negative except as noted in the HPI.   Past Surgical History:  Procedure Laterality Date  . CARDIAC CATHETERIZATION  ~ 2004   normal  . CARDIAC CATHETERIZATION N/A 09/07/2014   Procedure: Left Heart Cath and Coronary Angiography;  Surgeon: Laurey Morale, MD;  Location: [facility] INVASIVE CV LAB;  Service: Cardiovascular;  Laterality: N/A;  . CARDIAC DEFIBRILLATOR PLACEMENT  08/05/2014   St. Jude  . CARDIOVERSION N/A 08/21/2013   Procedure: CARDIOVERSION;  Surgeon: Quintella Reichert, MD;  Location: [facility] ENDOSCOPY;  Service: Cardiovascular;  Laterality: N/A;  . COLONOSCOPY  2010; 2015   "polyps; no polyps"  . EP IMPLANTABLE DEVICE N/A 08/05/2014   Procedure: ICD Implant;  Surgeon: [name], MD;  Location: [facility] INVASIVE CV LAB;  Service: Cardiovascular;  Laterality: N/A;  . FOOT NEUROMA SURGERY Right    related  to benign tumor   . JOINT REPLACEMENT    . NASAL SEPTUM SURGERY  ~ 1975  . TONSILLECTOMY    . TOTAL HIP ARTHROPLASTY  09/08/2011   Procedure: TOTAL HIP ARTHROPLASTY ANTERIOR APPROACH;  Surgeon: Mcarthur Rossetti, MD;  Location: [facility];  Service: Orthopedics;  Laterality: Right;  Right total hip replacement, left knee steroid injection  . TOTAL KNEE ARTHROPLASTY Left 11/29/2012   Procedure: LEFT TOTAL KNEE ARTHROPLASTY;  Surgeon: Mcarthur Rossetti, MD;  Location: [facility];  Service: Orthopedics;  Laterality: Left;  FEMORAL NERVE BLOCK IN HOLDING AREA LEFT LEG  . TOTAL KNEE ARTHROPLASTY Right 11/30/2017   Procedure: RIGHT TOTAL KNEE ARTHROPLASTY;  Surgeon: Mcarthur Rossetti, MD;   Location: [facility];  Service: Orthopedics;  Laterality: Right;  . URETHRAL FISTULA REPAIR  ~ 1996  . URETHRAL STRICTURE DILATATION  "several times"     Family History  Problem Relation Age of Onset  . Heart disease Father   . Heart attack Father 75  . Heart failure Brother      Social History   Socioeconomic History  . Marital status: Married    Spouse name: Not on file  . Number of children: Not on file  . Years of education: Not on file  . Highest education level: Not on file  Occupational History  . Not on file  Social Needs  . Financial resource strain: Not on file  . Food insecurity    Worry: Not on file    Inability: Not on file  . Transportation needs    Medical: Not on file    Non-medical: Not on file  Tobacco Use  . Smoking status: Never Smoker  . Smokeless tobacco: Never Used  Substance and Sexual Activity  . Alcohol use: Yes    Alcohol/week: 1.0 standard drinks    Types: 1 Cans of beer per week  . Drug use: No  . Sexual activity: Yes  Lifestyle  . Physical activity    Days per week: Not on file    Minutes per session: Not on file  . Stress: Not on file  Relationships  . Social Herbalist on phone: Not on file    Gets together: Not on file    Attends religious service: Not on file    Active member of club or organization: Not on file    Attends meetings of clubs or organizations: Not on file    Relationship status: Not on file  . Intimate partner violence    Fear of current or ex partner: Not on file    Emotionally abused: Not on file    Physically abused: Not on file    Forced sexual activity: Not on file  Other Topics Concern  . Not on file  Social History Narrative  . Not on file     BP 110/70   Pulse 61   Ht 5\' 11"  (1.803 m)   Wt 214 lb (97.1 kg)   SpO2 99%   BMI 29.85 kg/m   Physical Exam:  Well appearing NAD HEENT: Unremarkable Neck:  No JVD, no thyromegally Lymphatics:  No adenopathy Back:  No CVA tenderness  Lungs:  Clear HEART:  Regular rate rhythm, no murmurs, no rubs, no clicks Abd:  soft, positive bowel sounds, no organomegally, no rebound, no guarding Ext:  2 plus pulses, no edema, no cyanosis, no clubbing Skin:  No rashes no nodules Neuro:  CN II through XII intact, motor grossly intact  EKG - nsr with frequent PVC's  DEVICE  Normal device function.  See PaceArt for details.   Assess/Plan: 1. Persistent atrial fib - the patient is maintaining NSR. He will continue dofetilide. 2. Chronic systolic heart failure - His symptoms are class 2. He will continue his current meds. 3. ICD - his St. Jude single chamber ICD is working normally. We will recheck in several months.   Leonia Reeves.D.

## 2018-11-08 NOTE — Patient Instructions (Signed)

## 2018-11-11 ENCOUNTER — Telehealth: Payer: Self-pay | Admitting: *Deleted

## 2018-11-11 NOTE — Telephone Encounter (Signed)
Please comment on xarelto. 

## 2018-11-11 NOTE — Telephone Encounter (Signed)
Patient with diagnosis of atrial fibrillation on Xarelto for anticoagulation.    Procedure: LEFT ELBOW SYNOVECTOMY AND JOINT RELEASE Date of procedure: TBD  CHADS2-VASc score of 2 (CHF, HTN)  CrCl ~66 mL/min  Per office protocol, patient can hold Xarelto for 2 days prior to procedure.    Patient will not need bridging with Lovenox (enoxaparin) around procedure.

## 2018-11-11 NOTE — Telephone Encounter (Signed)
   [facility] Pre-operative Risk Assessment    Request for surgical clearance:  1. What type of surgery is being performed? LEFT ELBOW SYNOVECTOMY AND JOINT RELEASE    2. When is this surgery scheduled? TBD   3. What type of clearance is required (medical clearance vs. Pharmacy clearance to hold med vs. Both)? BOTH  4. Are there any medications that need to be held prior to surgery and how long? [place]   5. Practice name and name of physician performing surgery? [facility]; DR. Pence   6. What is your office phone number [phone]    7.   What is your office fax number [phone]  8.   Anesthesia type (None, local, MAC, general) ? NOT LISTED. TRIED X 2 TO REACH [facility] TO VERIFY ANESTHESIA THOUGH NO ONE CAME ON THE PHONE.    Julaine Hua 11/11/2018, 1:29 PM  _________________________________________________________________   (provider comments below)

## 2018-11-12 NOTE — Telephone Encounter (Signed)
Left detail message with recommendations, ok per DPR, and to call back if any questions.  

## 2018-11-12 NOTE — Telephone Encounter (Signed)
   Primary Cardiologist: Cristopher Peru, MD  Chart reviewed as part of pre-operative protocol coverage. Patient was contacted 11/12/2018 in reference to pre-operative risk assessment for pending surgery as outlined below.  Terry [name] was last seen on 10/10/18 by Dr. Radford Pax.  Since that day, Terry [name] has done well. Heart cath in 2016 without obstructive disease. He does no have a history of MI or stroke. He can complete more than 4.0 METS without anginal symptoms.   Per our clinical pharmacist: Patient with diagnosis of atrial fibrillation on Xarelto for anticoagulation.    Procedure: LEFT ELBOW SYNOVECTOMY AND JOINT RELEASE Date of procedure: TBD  CHADS2-VASc score of 2 (CHF, HTN)  CrCl ~66 mL/min  Per office protocol, patient can hold Xarelto for 2 days prior to procedure.    Patient will not need bridging with Lovenox (enoxaparin) around procedure.   Therefore, based on ACC/AHA guidelines, the patient would be at acceptable risk for the planned procedure without further cardiovascular testing.   I will route this recommendation to the requesting party via Epic fax function and remove from pre-op pool.  Please call with questions.  Tami Lin Dunya Meiners, PA 11/12/2018, 9:38 AM

## 2018-11-18 ENCOUNTER — Other Ambulatory Visit: Payer: Self-pay

## 2018-11-18 ENCOUNTER — Other Ambulatory Visit: Payer: Self-pay | Admitting: *Deleted

## 2018-11-18 ENCOUNTER — Ambulatory Visit (HOSPITAL_COMMUNITY): Payer: BC Managed Care – PPO | Attending: Cardiology

## 2018-11-18 DIAGNOSIS — I110 Hypertensive heart disease with heart failure: Secondary | ICD-10-CM | POA: Diagnosis not present

## 2018-11-18 DIAGNOSIS — I429 Cardiomyopathy, unspecified: Secondary | ICD-10-CM | POA: Insufficient documentation

## 2018-11-18 DIAGNOSIS — I509 Heart failure, unspecified: Secondary | ICD-10-CM | POA: Diagnosis not present

## 2018-11-18 DIAGNOSIS — I080 Rheumatic disorders of both mitral and aortic valves: Secondary | ICD-10-CM | POA: Diagnosis not present

## 2018-11-18 DIAGNOSIS — Z95810 Presence of automatic (implantable) cardiac defibrillator: Secondary | ICD-10-CM | POA: Diagnosis not present

## 2018-11-18 DIAGNOSIS — I4891 Unspecified atrial fibrillation: Secondary | ICD-10-CM | POA: Diagnosis not present

## 2018-11-18 DIAGNOSIS — I77810 Thoracic aortic ectasia: Secondary | ICD-10-CM | POA: Diagnosis not present

## 2018-11-19 ENCOUNTER — Other Ambulatory Visit: Payer: Self-pay | Admitting: Internal Medicine

## 2018-11-20 NOTE — Telephone Encounter (Signed)
surg clearance re-faxed to our office with anesthesia type: regional block w/IV sedation

## 2018-12-04 DIAGNOSIS — J392 Other diseases of pharynx: Secondary | ICD-10-CM | POA: Diagnosis not present

## 2018-12-10 ENCOUNTER — Ambulatory Visit (INDEPENDENT_AMBULATORY_CARE_PROVIDER_SITE_OTHER): Payer: BC Managed Care – PPO | Admitting: *Deleted

## 2018-12-10 DIAGNOSIS — I480 Paroxysmal atrial fibrillation: Secondary | ICD-10-CM

## 2018-12-10 DIAGNOSIS — I420 Dilated cardiomyopathy: Secondary | ICD-10-CM

## 2018-12-10 LAB — CUP PACEART REMOTE DEVICE CHECK
Battery Remaining Longevity: 67 mo
Battery Remaining Percentage: 66 %
Battery Voltage: 2.92 V
Brady Statistic RV Percent Paced: 1 %
Date Time Interrogation Session: 20201026060015
HighPow Impedance: 62 Ohm
HighPow Impedance: 62 Ohm
Implantable Lead Implant Date: 20160622
Implantable Lead Location: 753860
Implantable Lead Model: 181
Implantable Lead Serial Number: 331169
Implantable Pulse Generator Implant Date: 20160622
Lead Channel Impedance Value: 380 Ohm
Lead Channel Pacing Threshold Amplitude: 0.75 V
Lead Channel Pacing Threshold Pulse Width: 0.5 ms
Lead Channel Sensing Intrinsic Amplitude: 8 mV
Lead Channel Setting Pacing Amplitude: 2.5 V
Lead Channel Setting Pacing Pulse Width: 0.5 ms
Lead Channel Setting Sensing Sensitivity: 0.5 mV
Pulse Gen Serial Number: 7280026

## 2018-12-11 DIAGNOSIS — M05722 Rheumatoid arthritis with rheumatoid factor of left elbow without organ or systems involvement: Secondary | ICD-10-CM | POA: Diagnosis not present

## 2018-12-17 DIAGNOSIS — Z4789 Encounter for other orthopedic aftercare: Secondary | ICD-10-CM | POA: Diagnosis not present

## 2018-12-17 DIAGNOSIS — M05722 Rheumatoid arthritis with rheumatoid factor of left elbow without organ or systems involvement: Secondary | ICD-10-CM | POA: Diagnosis not present

## 2018-12-19 ENCOUNTER — Other Ambulatory Visit (HOSPITAL_COMMUNITY): Payer: Self-pay | Admitting: Cardiology

## 2018-12-24 DIAGNOSIS — M25522 Pain in left elbow: Secondary | ICD-10-CM | POA: Diagnosis not present

## 2018-12-31 NOTE — Progress Notes (Signed)
Remote ICD transmission.   

## 2019-01-07 ENCOUNTER — Telehealth (HOSPITAL_COMMUNITY): Payer: Self-pay | Admitting: Pharmacy Technician

## 2019-01-07 NOTE — Telephone Encounter (Signed)
Advanced Heart Failure Patient Advocate Encounter  Prior Authorization for Entresto 97-103mg  has been approved.     Effective dates: 01/06/2019 through 01/06/2020  Charlann Boxer, CPhT

## 2019-01-17 ENCOUNTER — Telehealth: Payer: Self-pay | Admitting: *Deleted

## 2019-01-17 NOTE — Telephone Encounter (Signed)
   Primary Cardiologist: Cristopher Peru, MD  Chart reviewed as part of pre-operative protocol coverage. Given past medical history and time since last visit, based on ACC/AHA guidelines, Terry [name] would be at acceptable risk for the planned procedure without further cardiovascular testing.   Pt takes Xarelto for afib with CHADS2VASc score of 2 (CHF, HTN). SCr 1.57, CrCl 33mL/min. Ok to hold Xarelto for 2 days prior to colonoscopy.  I will route this recommendation to the requesting party via Epic fax function and remove from pre-op pool.  Please call with questions.  Kathyrn Drown, NP 01/17/2019, 4:52 PM

## 2019-01-17 NOTE — Telephone Encounter (Signed)
Pt takes Xarelto for afib with CHADS2VASc score of 2 (CHF, HTN). SCr 1.57, CrCl 30mL/min. Ok to hold Xarelto for 2 days prior to colonoscopy.

## 2019-01-17 NOTE — Telephone Encounter (Signed)
   [facility] Pre-operative Risk Assessment    Request for surgical clearance:  1. What type of surgery is being performed?  Colonoscopy   2. When is this surgery scheduled?  02/05/2019   3. What type of clearance is required (medical clearance vs. Pharmacy clearance to hold med vs. Both)?  Pharmacy  4. Are there any medications that need to be held prior to surgery and how long? Xarelto X's 2 days prior   5. Practice name and name of physician performing surgery?  [facility] / Dr. Cristina Gong   6. What is your office phone number [phone]     7.   What is your office fax number:  [phone]  8.   Anesthesia type (None, local, MAC, general) ?  Propofol    Terry [name] 01/17/2019, 3:32 PM  _________________________________________________________________   (provider comments below)

## 2019-01-31 ENCOUNTER — Telehealth (HOSPITAL_COMMUNITY): Payer: Self-pay | Admitting: Pharmacist

## 2019-01-31 NOTE — Telephone Encounter (Signed)
Patient Advocate Encounter   Received notification from [employer] [employer] that prior authorization for Dofetilide is required.   PA submitted via Fax Fax: [phone] Status is pending   Will continue to follow.  [name], PharmD, BCPS, BCCP, CPP Heart Failure Clinic Pharmacist [phone]

## 2019-02-03 NOTE — Telephone Encounter (Signed)
Advanced Heart Failure Patient Advocate Encounter  Prior Authorization for Terry [name] has been approved.   Effective dates: 01/31/19 through 01/31/20  [name], PharmD, BCPS, BCCP, CPP Heart Failure Clinic Pharmacist [phone]

## 2019-02-14 HISTORY — PX: ELBOW SURGERY: SHX618

## 2019-02-18 DIAGNOSIS — M25522 Pain in left elbow: Secondary | ICD-10-CM | POA: Diagnosis not present

## 2019-02-20 DIAGNOSIS — M25522 Pain in left elbow: Secondary | ICD-10-CM | POA: Diagnosis not present

## 2019-02-20 LAB — CUP PACEART INCLINIC DEVICE CHECK
Date Time Interrogation Session: 20200925121032
Implantable Lead Implant Date: 20160622
Implantable Lead Location: 753860
Implantable Lead Model: 181
Implantable Lead Serial Number: 331169
Implantable Pulse Generator Implant Date: 20160622
Pulse Gen Serial Number: 7280026

## 2019-02-24 DIAGNOSIS — M25522 Pain in left elbow: Secondary | ICD-10-CM | POA: Diagnosis not present

## 2019-02-27 DIAGNOSIS — M25522 Pain in left elbow: Secondary | ICD-10-CM | POA: Diagnosis not present

## 2019-03-03 DIAGNOSIS — M25522 Pain in left elbow: Secondary | ICD-10-CM | POA: Diagnosis not present

## 2019-03-06 DIAGNOSIS — M25522 Pain in left elbow: Secondary | ICD-10-CM | POA: Diagnosis not present

## 2019-03-11 ENCOUNTER — Ambulatory Visit (INDEPENDENT_AMBULATORY_CARE_PROVIDER_SITE_OTHER): Payer: BC Managed Care – PPO | Admitting: *Deleted

## 2019-03-11 DIAGNOSIS — I480 Paroxysmal atrial fibrillation: Secondary | ICD-10-CM | POA: Diagnosis not present

## 2019-03-11 DIAGNOSIS — M25522 Pain in left elbow: Secondary | ICD-10-CM | POA: Diagnosis not present

## 2019-03-11 LAB — CUP PACEART REMOTE DEVICE CHECK
Battery Remaining Longevity: 47 mo
Battery Remaining Percentage: 45 %
Battery Voltage: 2.84 V
Brady Statistic RV Percent Paced: 1 %
Date Time Interrogation Session: 20210126050416
HighPow Impedance: 64 Ohm
HighPow Impedance: 64 Ohm
Implantable Lead Implant Date: 20160622
Implantable Lead Location: 753860
Implantable Lead Model: 181
Implantable Lead Serial Number: 331169
Implantable Pulse Generator Implant Date: 20160622
Lead Channel Impedance Value: 380 Ohm
Lead Channel Pacing Threshold Amplitude: 0.75 V
Lead Channel Pacing Threshold Pulse Width: 0.5 ms
Lead Channel Sensing Intrinsic Amplitude: 6.4 mV
Lead Channel Setting Pacing Amplitude: 2.5 V
Lead Channel Setting Pacing Pulse Width: 0.5 ms
Lead Channel Setting Sensing Sensitivity: 0.5 mV
Pulse Gen Serial Number: 7280026

## 2019-03-13 DIAGNOSIS — M25522 Pain in left elbow: Secondary | ICD-10-CM | POA: Diagnosis not present

## 2019-03-16 ENCOUNTER — Other Ambulatory Visit: Payer: Self-pay | Admitting: Cardiology

## 2019-03-17 MED ORDER — CARVEDILOL 25 MG PO TABS: 25.0000 mg | ORAL_TABLET | Freq: Two times a day (BID) | ORAL | 2 refills | Status: DC

## 2019-03-20 DIAGNOSIS — M7712 Lateral epicondylitis, left elbow: Secondary | ICD-10-CM | POA: Diagnosis not present

## 2019-03-20 DIAGNOSIS — M05722 Rheumatoid arthritis with rheumatoid factor of left elbow without organ or systems involvement: Secondary | ICD-10-CM | POA: Diagnosis not present

## 2019-04-05 ENCOUNTER — Other Ambulatory Visit: Payer: Self-pay | Admitting: Cardiology

## 2019-04-12 ENCOUNTER — Other Ambulatory Visit: Payer: Self-pay | Admitting: Internal Medicine

## 2019-04-16 NOTE — Progress Notes (Signed)
Virtual Visit via Video Note   This visit type was conducted due to national recommendations for restrictions regarding the COVID-19 Pandemic (e.g. social distancing) in an effort to limit this patient's exposure and mitigate transmission in our community.  Due to his co-morbid illnesses, this patient is at least at moderate risk for complications without adequate follow up.  This format is felt to be most appropriate for this patient at this time.  All issues noted in this document were discussed and addressed.  A limited physical exam was performed with this format.  Please refer to the patient's chart for his consent to telehealth for [facility].   Evaluation Performed:  Follow-up visit  This visit type was conducted due to national recommendations for restrictions regarding the COVID-19 Pandemic (e.g. social distancing).  This format is felt to be most appropriate for this patient at this time.  All issues noted in this document were discussed and addressed.  No physical exam was performed (except for noted visual exam findings with Video Visits).  Please refer to the patient's chart (MyChart message for video visits and phone note for telephone visits) for the patient's consent to telehealth for [facility].  Date:  04/17/2019   ID:  Terry [name], DOB 1955-03-02, MRN [id number]  Patient Location:  Home  Provider location:   [place]  PCP:  Quintella Reichert, MD  Cardiologist:  Armanda Magic, MD Electrophysiologist: Lewayne Bunting, MD  Chief Complaint:  CHF, HTN, PAF, DCM  History of Present Illness:    Terry [name] is a 64 y.o. male who presents via audio/video conferencing for a telehealth visit today.    Kaelob Persky Juddis a 64 y.o.malewith a history of chronic systolic CHF due to nonischemic cardiomyopathy s/p St Jude ICD 08/05/14, HTN, and persistent AF.Back in 2006, an echo showed EF 15-20%. LHC at that time showed normal coronaries. He has been managed for cardiomyopathy  since that time. In 12/14, EF had improved to 50%. However, he then had a decline with echo in 5/16 showing EF 25%. Atrial fibrillation was first noted in 2013. In 7/15, he had DCCV to NSR. In 1/16, he was noted to be back in atrial fibrillation. He had St Jude ICD placed by Dr [name] in 6/16. Given fall in EF, Lexiscan Cardiolite was done. This showed EF 39% with partially reversible inferior and apical perfusion defect. He had LHC in 7/16 showing no significant CAD. In 8/16, he was admitted for Tikosyn initiation and converted to NSR. Echo in 8/18 showed EF 35-40%, mild to moderately decreased RV systolic function.  Echo in 8/19 showed EF 40-45%, mild-moderate MR, mild to moderately decreased RV systolic function, mild to moderate MR and dilated aortic root at 45mm and ascending aorta 28mm.Holter showed 8% PVCs by tele monitor. He is followed by EP for his ICD. Workup for DCM included HIV (neg), ferritin (normal) and no monoclonal Ab on SPEP/UPEP. He is followed as well by AHF service.    He is here today for followup and is doing well.  He denies any chest pain or pressure, SOB, DOE, PND, orthopnea, LE edema, dizziness, palpitations or syncope. He is compliant with his meds and is tolerating meds with no SE.    The patient does not have symptoms concerning for COVID-19 infection (fever, chills, cough, or new shortness of breath).    Prior CV studies:   The following studies were reviewed today:  none  Past Medical History:  Diagnosis Date  .  AICD (automatic cardioverter/defibrillator) present   . Anemia    "S/P knee & hip OR"  . Aortic insufficiency    mild   . Aortic root dilatation (HCC)   . Chronic systolic CHF (congestive heart failure), NYHA class 1 (HCC)   . Complication of anesthesia    hx of irregular beat after anesthesia in ~ 1990's  . GERD (gastroesophageal reflux disease)   . H/O hematuria   . H/O hiatal hernia   . 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 History of blood transfusion 2013; 2014   S/P knee and hip OR  . Hypertension   . Nonischemic dilated cardiomyopathy (HCC)    EF 40-45% by echo 2019.  S/P AICD for primary prevention  . PAF (paroxysmal atrial fibrillation) (HCC)    "off and on since 2013" (08/05/2014)  . PVC (premature ventricular contraction)    PVC load 8%  . Rheumatoid arthritis (HCC) dx'd 1995   Past Surgical History:  Procedure Laterality Date  . CARDIAC CATHETERIZATION  ~ 2004   normal  . CARDIAC CATHETERIZATION N/A 09/07/2014   Procedure: Left Heart Cath and Coronary Angiography;  Surgeon: Laurey Morale, MD;  Location: [facility] INVASIVE CV LAB;  Service: Cardiovascular;  Laterality: N/A;  . CARDIAC DEFIBRILLATOR PLACEMENT  08/05/2014   St. Jude  . CARDIOVERSION N/A 08/21/2013   Procedure: CARDIOVERSION;  Surgeon: Quintella Reichert, MD;  Location: [facility] ENDOSCOPY;  Service: Cardiovascular;  Laterality: N/A;  . COLONOSCOPY  2010; 2015   "polyps; no polyps"  . EP IMPLANTABLE DEVICE N/A 08/05/2014   Procedure: ICD Implant;  Surgeon: [name], MD;  Location: [facility] INVASIVE CV LAB;  Service: Cardiovascular;  Laterality: N/A;  . 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 JOINT REPLACEMENT    . NASAL SEPTUM SURGERY  ~ 1975  . TONSILLECTOMY    . TOTAL HIP ARTHROPLASTY  09/08/2011   Procedure: TOTAL HIP ARTHROPLASTY ANTERIOR APPROACH;  Surgeon: [name], MD;  Location: [facility] ORS;  Service: Orthopedics;  Laterality: Right;  Right total hip replacement, left knee steroid injection  . TOTAL KNEE ARTHROPLASTY Left 11/29/2012   Procedure: LEFT TOTAL KNEE ARTHROPLASTY;  Surgeon: [name], MD;  Location: [facility] ORS;  Service: Orthopedics;  Laterality: Left;  FEMORAL NERVE BLOCK IN HOLDING AREA LEFT LEG  . TOTAL KNEE ARTHROPLASTY Right 11/30/2017   Procedure: RIGHT TOTAL KNEE ARTHROPLASTY;  Surgeon: [name], MD;  Location: [facility] ORS;  Service: Orthopedics;  Laterality: Right;    . URETHRAL FISTULA REPAIR  ~ 1996  . URETHRAL STRICTURE DILATATION  "several times"     Current Meds  Medication Sig  . carvedilol (COREG) 25 MG tablet Take 1 tablet (25 mg total) by mouth 2 (two) times daily.  Marland Kitchen dofetilide (TIKOSYN) 250 MCG capsule Take 1 capsule (250 mcg total) by mouth 2 (two) times daily. Please call for office visit [phone]  . doxycycline (VIBRAMYCIN) 100 MG capsule Take 100 mg by mouth 2 (two) times daily.  Marland Kitchen eplerenone (INSPRA) 50 MG tablet Take 1 tablet (50 mg total) by mouth daily.  . furosemide (LASIX) 40 MG tablet TAKE 1-1.5 TABLETS (40-60 MG TOTAL) BY MOUTH SEE ADMIN INSTRUCTIONS. ALTERNATE BETWEEN 40 MG AND 60 MG DAILY  . multivitamin-iron-minerals-folic acid (CENTRUM) chewable tablet Chew 1 tablet by mouth daily.  . sacubitril-valsartan (ENTRESTO) 97-103 MG Take 1 tablet by mouth 2 (two) times daily.  Carlena Hurl 20 MG TABS  tablet TAKE 1 TABLET BY MOUTH DAILY WITH SUPPER.     Allergies:   Spironolactone and Sulfa antibiotics   Social History   Tobacco Use  . Smoking status: Never Smoker  . Smokeless tobacco: Never Used  Substance Use Topics  . Alcohol use: Yes    Alcohol/week: 1.0 standard drinks    Types: 1 Cans of beer per week  . Drug use: No     Family Hx: The patient's family history includes Heart attack (age of onset: 30) in his father; Heart disease in his father; Heart failure in his brother.  ROS:   Please see the history of present illness.     All other systems reviewed and are negative.   Labs/Other Tests and Data Reviewed:    Recent Labs: 10/14/2018: Hemoglobin 11.8; Platelets 271 10/30/2018: BUN 23; Creatinine, Ser 1.57; Potassium 4.9; Sodium 135   Recent Lipid Panel No results found for: CHOL, TRIG, HDL, CHOLHDL, LDLCALC, LDLDIRECT  Wt Readings from Last 3 Encounters:  04/17/19 209 lb (94.8 kg)  11/08/18 214 lb (97.1 kg)  10/10/18 207 lb (93.9 kg)     Objective:    Vital Signs:  BP (!) 107/55   Pulse 62   Ht  5\' 11"  (1.803 m)   Wt 209 lb (94.8 kg)   SpO2 99%   BMI 29.15 kg/m    CONSTITUTIONAL:  Well nourished, well developed male in no acute distress.  EYES: anicteric MOUTH: oral mucosa is pink RESPIRATORY: Normal respiratory effort, symmetric expansion CARDIOVASCULAR: No peripheral edema SKIN: No rash, lesions or ulcers MUSCULOSKELETAL: no digital cyanosis NEURO: Cranial Nerves II-XII grossly intact, moves all extremities PSYCH: Intact judgement and insight.  A&O x 3, Mood/affect appropriate   ASSESSMENT & PLAN:    1.Chronic systolic CHF-  -He has stable NHYA Class I symptoms.  -He remains very active working out in his yard.  -He did not tolerate spiro due to gynecomastia and is now on Eplerenone 50mg  daily.  -he has not had any SOB and LE edema is controlled -He will continue on Carvedilol 25mg  BID, Lasix 40 - 60mg  daily, Entresto 97=103mg  BID and eplerenone 50mg  daily. -repeat BMET and Mag -EF 40-45% by echo 11/2018 on max guideline directed HF meds  2. HTN - BP well controlled -continue Carvedilol, Entresto and Eplerenone  3. Paroxysmal atrial fibrillation -denies any palpitations -continue Carvedilol 25mg  BID and Tikosyn BID  -He is on Xarelto for a CHADS2VASC score of 3 (HTN, CHF and atherosclerosis of the aorta) -he denies any problems with bleeding on DOAC -Repeat BMET and CBC  4. PVCs  -Holter monitor showed 8% PVC load and therefore likely not the etiology of his DCM. -continue BB  5. Nonischemic DCM -Workup for secondary causes were negative (HIV/monoclonal Ab/normal ferritin).  -Cardiac cath 08/2014 with no CAD. -S/P AICD for primary prevention followed in device clinic by Dr. - no events on recent ICD interrogation -Not a candidate for CRT due to narrow QRS.  6.  Dilated aortic root -mildly dilated on echo 11/2018 at 35mm -repeat echo in 1 year  COVID-19 Education: The signs and symptoms of COVID-19 were discussed with  the patient and how to seek care for testing (follow up with PCP or arrange E-visit).  The importance of social distancing was discussed today.  Patient Risk:   After full review of this patient's clinical status, I feel that they are at least moderate risk at this time.  Time:   Today, I have  spent 20 minutes on telemedicine discussing medical problems including CHF, HTN, PAF and reviewing patient's chart including outside labs from PCP.  Medication Adjustments/Labs and Tests Ordered: Current medicines are reviewed at length with the patient today.  Concerns regarding medicines are outlined above.  Tests Ordered: No orders of the defined types were placed in this encounter.  Medication Changes: No orders of the defined types were placed in this encounter.   Disposition:  Follow up in 6 month(s) with me  Signed, Fransico Him, MD  04/17/2019 8:55 AM    [place]

## 2019-04-17 ENCOUNTER — Encounter: Payer: Self-pay | Admitting: Cardiology

## 2019-04-17 ENCOUNTER — Telehealth (INDEPENDENT_AMBULATORY_CARE_PROVIDER_SITE_OTHER): Payer: BC Managed Care – PPO | Admitting: Cardiology

## 2019-04-17 ENCOUNTER — Other Ambulatory Visit: Payer: Self-pay

## 2019-04-17 ENCOUNTER — Other Ambulatory Visit: Payer: BC Managed Care – PPO | Admitting: *Deleted

## 2019-04-17 VITALS — BP 107/55 | HR 62 | Ht 71.0 in | Wt 209.0 lb

## 2019-04-17 DIAGNOSIS — I4819 Other persistent atrial fibrillation: Secondary | ICD-10-CM

## 2019-04-17 DIAGNOSIS — I10 Essential (primary) hypertension: Secondary | ICD-10-CM

## 2019-04-17 DIAGNOSIS — I480 Paroxysmal atrial fibrillation: Secondary | ICD-10-CM

## 2019-04-17 DIAGNOSIS — I420 Dilated cardiomyopathy: Secondary | ICD-10-CM | POA: Diagnosis not present

## 2019-04-17 DIAGNOSIS — I110 Hypertensive heart disease with heart failure: Secondary | ICD-10-CM

## 2019-04-17 DIAGNOSIS — I5022 Chronic systolic (congestive) heart failure: Secondary | ICD-10-CM

## 2019-04-17 DIAGNOSIS — I493 Ventricular premature depolarization: Secondary | ICD-10-CM

## 2019-04-17 NOTE — Addendum Note (Signed)
Addended by: Theresia Majors on: 04/17/2019 10:02 AM   Modules accepted: Orders

## 2019-04-17 NOTE — Patient Instructions (Addendum)
Medication Instructions:  Your physician recommends that you continue on your current medications as directed. Please refer to the Current Medication list given to you today.  *If you need a refill on your cardiac medications before your next appointment, please call your pharmacy*   Lab Work: TODAY: BMET, Mag, CBC If you have labs (blood work) drawn today and your tests are completely normal, you will receive your results only by: Marland Kitchen MyChart Message (if you have MyChart) OR . A paper copy in the mail If you have any lab test that is abnormal or we need to change your treatment, we will call you to review the results.   Testing/Procedures: Your physician has requested that you have an echocardiogram in October 2021. Echocardiography is a painless test that uses sound waves to create images of your heart. It provides your doctor with information about the size and shape of your heart and how well your heart's chambers and valves are working. This procedure takes approximately one hour. There are no restrictions for this procedure.   Follow-Up: At [facility], you and your health needs are our priority.  As part of our continuing mission to provide you with exceptional heart care, we have created designated Provider Care Teams.  These Care Teams include your primary Cardiologist (physician) and Advanced Practice Providers (APPs -  Physician Assistants and Nurse Practitioners) who all work together to provide you with the care you need, when you need it.  Your next appointment:   6 month(s)  The format for your next appointment:   Either In Person or Virtual  Provider:   Armanda Magic, MD   Other Instructions Follow up with Dr. [name] on June 8th 2021 at 1:40pm.

## 2019-04-18 LAB — BASIC METABOLIC PANEL
BUN/Creatinine Ratio: 15 (ref 10–24)
BUN: 23 mg/dL (ref 8–27)
CO2: 22 mmol/L (ref 20–29)
Calcium: 9.1 mg/dL (ref 8.6–10.2)
Chloride: 99 mmol/L (ref 96–106)
Creatinine, Ser: 1.52 mg/dL — ABNORMAL HIGH (ref 0.76–1.27)
GFR calc Af Amer: 56 mL/min/{1.73_m2} — ABNORMAL LOW (ref >59)
GFR calc non Af Amer: 48 mL/min/{1.73_m2} — ABNORMAL LOW (ref >59)
Glucose: 134 mg/dL — ABNORMAL HIGH (ref 65–99)
Potassium: 4.7 mmol/L (ref 3.5–5.2)
Sodium: 137 mmol/L (ref 134–144)

## 2019-04-18 LAB — CBC
Hematocrit: 35.5 % — ABNORMAL LOW (ref 37.5–51.0)
Hemoglobin: 12.2 g/dL — ABNORMAL LOW (ref 13.0–17.7)
MCH: 28.7 pg (ref 26.6–33.0)
MCHC: 34.4 g/dL (ref 31.5–35.7)
MCV: 84 fL (ref 79–97)
Platelets: 223 10*3/uL (ref 150–450)
RBC: 4.25 x10E6/uL (ref 4.14–5.80)
RDW: 13.7 % (ref 11.6–15.4)
WBC: 7.3 10*3/uL (ref 3.4–10.8)

## 2019-04-18 LAB — MAGNESIUM: Magnesium: 2 mg/dL (ref 1.6–2.3)

## 2019-04-30 DIAGNOSIS — Z23 Encounter for immunization: Secondary | ICD-10-CM | POA: Diagnosis not present

## 2019-06-10 ENCOUNTER — Other Ambulatory Visit (HOSPITAL_COMMUNITY): Payer: Self-pay | Admitting: Cardiology

## 2019-06-10 ENCOUNTER — Ambulatory Visit (INDEPENDENT_AMBULATORY_CARE_PROVIDER_SITE_OTHER): Payer: BC Managed Care – PPO | Admitting: *Deleted

## 2019-06-10 DIAGNOSIS — I480 Paroxysmal atrial fibrillation: Secondary | ICD-10-CM

## 2019-06-10 LAB — CUP PACEART REMOTE DEVICE CHECK
Battery Remaining Longevity: 64 mo
Battery Remaining Percentage: 62 %
Battery Voltage: 2.92 V
Brady Statistic RV Percent Paced: 1.1 %
Date Time Interrogation Session: 20210427020017
HighPow Impedance: 65 Ohm
HighPow Impedance: 65 Ohm
Implantable Lead Implant Date: 20160622
Implantable Lead Location: 753860
Implantable Lead Model: 181
Implantable Lead Serial Number: 331169
Implantable Pulse Generator Implant Date: 20160622
Lead Channel Impedance Value: 380 Ohm
Lead Channel Pacing Threshold Amplitude: 0.75 V
Lead Channel Pacing Threshold Pulse Width: 0.5 ms
Lead Channel Sensing Intrinsic Amplitude: 6.8 mV
Lead Channel Setting Pacing Amplitude: 2.5 V
Lead Channel Setting Pacing Pulse Width: 0.5 ms
Lead Channel Setting Sensing Sensitivity: 0.5 mV
Pulse Gen Serial Number: 7280026

## 2019-06-11 NOTE — Progress Notes (Signed)
ICD Remote  

## 2019-07-22 ENCOUNTER — Ambulatory Visit (HOSPITAL_COMMUNITY)
Admission: RE | Admit: 2019-07-22 | Discharge: 2019-07-22 | Disposition: A | Discharge status: Home / Self Care | Payer: BC Managed Care – PPO | Source: Ambulatory Visit | Attending: Cardiology | Admitting: Cardiology

## 2019-07-22 ENCOUNTER — Encounter (HOSPITAL_COMMUNITY): Payer: Self-pay | Admitting: Cardiology

## 2019-07-22 ENCOUNTER — Other Ambulatory Visit: Payer: Self-pay

## 2019-07-22 VITALS — BP 90/50 | HR 53 | Wt 212.8 lb

## 2019-07-22 DIAGNOSIS — I493 Ventricular premature depolarization: Secondary | ICD-10-CM | POA: Insufficient documentation

## 2019-07-22 DIAGNOSIS — I110 Hypertensive heart disease with heart failure: Secondary | ICD-10-CM | POA: Insufficient documentation

## 2019-07-22 DIAGNOSIS — I5022 Chronic systolic (congestive) heart failure: Secondary | ICD-10-CM | POA: Insufficient documentation

## 2019-07-22 DIAGNOSIS — Z79899 Other long term (current) drug therapy: Secondary | ICD-10-CM | POA: Diagnosis not present

## 2019-07-22 DIAGNOSIS — N62 Hypertrophy of breast: Secondary | ICD-10-CM | POA: Diagnosis not present

## 2019-07-22 DIAGNOSIS — Z7901 Long term (current) use of anticoagulants: Secondary | ICD-10-CM | POA: Insufficient documentation

## 2019-07-22 DIAGNOSIS — Z95810 Presence of automatic (implantable) cardiac defibrillator: Secondary | ICD-10-CM | POA: Insufficient documentation

## 2019-07-22 DIAGNOSIS — I428 Other cardiomyopathies: Secondary | ICD-10-CM | POA: Diagnosis not present

## 2019-07-22 DIAGNOSIS — Z8249 Family history of ischemic heart disease and other diseases of the circulatory system: Secondary | ICD-10-CM | POA: Diagnosis not present

## 2019-07-22 DIAGNOSIS — I480 Paroxysmal atrial fibrillation: Secondary | ICD-10-CM | POA: Diagnosis not present

## 2019-07-22 LAB — BASIC METABOLIC PANEL
Anion gap: 12 (ref 5–15)
BUN: 31 mg/dL — ABNORMAL HIGH (ref 8–23)
CO2: 20 mmol/L — ABNORMAL LOW (ref 22–32)
Calcium: 9 mg/dL (ref 8.9–10.3)
Chloride: 106 mmol/L (ref 98–111)
Creatinine, Ser: 1.74 mg/dL — ABNORMAL HIGH (ref 0.61–1.24)
GFR calc Af Amer: 47 mL/min — ABNORMAL LOW (ref >60)
GFR calc non Af Amer: 41 mL/min — ABNORMAL LOW (ref >60)
Glucose, Bld: 147 mg/dL — ABNORMAL HIGH (ref 70–99)
Potassium: 5 mmol/L (ref 3.5–5.1)
Sodium: 138 mmol/L (ref 135–145)

## 2019-07-22 LAB — CBC
HCT: 36 % — ABNORMAL LOW (ref 39.0–52.0)
Hemoglobin: 11.7 g/dL — ABNORMAL LOW (ref 13.0–17.0)
MCH: 28.8 pg (ref 26.0–34.0)
MCHC: 32.5 g/dL (ref 30.0–36.0)
MCV: 88.7 fL (ref 80.0–100.0)
Platelets: 246 10*3/uL (ref 150–400)
RBC: 4.06 MIL/uL — ABNORMAL LOW (ref 4.22–5.81)
RDW: 13.2 % (ref 11.5–15.5)
WBC: 8.1 10*3/uL (ref 4.0–10.5)
nRBC: 0 % (ref 0.0–0.2)

## 2019-07-22 LAB — MAGNESIUM: Magnesium: 2.1 mg/dL (ref 1.7–2.4)

## 2019-07-22 NOTE — Patient Instructions (Signed)
Labs done today, your results will be available in MyChart, we will contact you for abnormal readings.  Labs again in 3 months  Please call our office in December to schedule your echocardiogram and follow up appointment  If you have any questions or concerns before your next appointment please send [place] a message through [place] or call our office at [phone].    TO LEAVE A MESSAGE FOR THE NURSE SELECT OPTION 2, PLEASE LEAVE A MESSAGE INCLUDING: . YOUR NAME . DATE OF BIRTH . CALL BACK NUMBER . REASON FOR CALL**this is important as we prioritize the call backs  YOU WILL RECEIVE A CALL BACK THE SAME DAY AS LONG AS YOU CALL BEFORE 4:00 PM  At the [facility], you and your health needs are our priority. As part of our continuing mission to provide you with exceptional heart care, we have created designated Provider Care Teams. These Care Teams include your primary Cardiologist (physician) and Advanced Practice Providers (APPs- Physician Assistants and Nurse Practitioners) who all work together to provide you with the care you need, when you need it.   You may see any of the following providers on your designated Care Team at your next follow up: Marland Kitchen Dr Arvilla Meres . Dr Marca Ancona . Tonye Becket, NP . Robbie Lis, PA . Karle Plumber, PharmD   Please be sure to bring in all your medications bottles to every appointment.

## 2019-07-23 NOTE — Progress Notes (Signed)
Patient ID: Terry [name], male   DOB: 1955-03-19, 64 y.o.   MRN: [id number] Primary Care: [name], MD Primary Cardiologist: Armanda Magic, MD HF Cardiologist: Dr [name]  HPI: Terry [name] is a 64 y.o. 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 Back in 2006, an echo showed EF 15-20%.  LHC at that time showed normal coronaries.  He has been managed for cardiomyopathy since that time. In 12/14, EF had improved to 50%.  However, he has had a decline since then.  Last echo in 5/16 showed EF 25%.  Atrial fibrillation was first noted in 2013.  In 7/15, he had DCCV to NSR.  In 1/16, he was noted to be back in atrial fibrillation and appears to have been in atrial fibrillation since that time. He had St Jude ICD placed by Dr [name] in 6/16.  Given fall in EF, Lexiscan Cardiolite was done. This showed EF 39% with partially reversible inferior and apical perfusion defect. He had LHC in 7/16 showing no significant CAD.  In 8/16, he was admitted for Tikosyn initiation and converted to NSR.  Echo in 8/18 showed EF 35-40%, mild to moderately decreased RV systolic function.  Echo in 8/19 showed EF 40-45%, mild-moderate MR, mild to moderately decreased RV systolic function.   Holter in 8/19 showed 8% PVCs.   He had right TKR in 10/19 without complications.   Echo in 10/20 showed EF stable at 40-45%.   He presents today for followup of CHF.  He is doing very well. NSR today and states that he has been in NSR based on his Apple Watch.  No palpitations.  No significant exertional dyspnea or chest pain. Weight is up. He mows his grass, weed-eats, and swims for exercise.   St Jude device interrogation: Stable thoracic impedance, no VT.    ECG (personally reviewed): NSR, LAFB, PVCs, QTc 484 msec  HIV negative, ferritin normal, immunofixation with no monoclonal protein.  Labs (6/16): K 4.2, Creatinine 1.08, HCT 37.5 Labs (08/18/14):  K 4.2, Creatinine 1.0 Labs (8/16): K 4.7, creatinine 1.18 Labs (9/16): Mg 2.1, creatinine 1.16, K 4.6 Labs (6/18): K 4.4, creatinine 1.25 Labs (9/18): K 4.4, creatinine 1.24 Labs (12/18): K 4.1, creatinine 1.2, hgb 11.6 Labs (10/19): K 4.3, creatinine 1.0 Labs (3/21): K 4.7, creatinine 1.52  PMH 1. Nonischemic dilated cardiomyopathy: Echo (2006) with EF 15-20%.  LHC in 10/06 showed normal coronaries.  By 2014, EF had improved to 50%.  Echo in 1/16 showed EF 35-40%.  Echo (5/16) showed EF 25% with mild MR.  St Jude ICD 6/16.  HIV negative, immunofixation with no monoclonal protein, and ferritin normal.  Lexiscan Cardiolite (7/16) with EF 39% and partially reversible inferior and apical perfusion defect (intermediate risk).   LHC (7/16) with EF 35-40%, no significant CAD.  - Echo (8/17): EF 45-50% - Echo (8/18): EF 35-40%, moderate diastoilc dysfunction, mild AI, mild MR, mild to moderately decreased RV systolic function.   - Echo (8/19): EF 40-45%, mild-moderate MR, mild to moderately decreased RV systolic function.  - Echo (10/20): EF 40-45%, mild LVH, normal RV, mild MR.  2. Atrial fibrillation: Paroxysmal.  First noted in 2013.  DCCV in 7/15.  Back in atrial fibrillation 1/16.  Converted back to NSR with Tikosyn in 8/16.  3. HTN 4. Rheumatoid Arthritis: Has been off all medications for several years.   5. H/o bilateral THR 6. Gynecomastia with  spironolactone.  7. PVCs: Holter in 8/19 showed 8% PVCs.   SH: Lives at home with wife and 2 children and a grandchild. Never smoker, drinks 1 or 2 beers a week with dinner. 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   FH: Father with MI at 20, brother with atrial fibrillation, mother with renal failure.   Review of systems complete and found to be negative unless listed in HPI.    Current Outpatient Medications  Medication Sig Dispense Refill  . carvedilol (COREG) 25 MG tablet Take 1 tablet (25 mg total) by mouth 2 (two)  times daily. 180 tablet 2  . dofetilide (TIKOSYN) 250 MCG capsule Take 1 capsule (250 mcg total) by mouth 2 (two) times daily. Please call for office visit [phone] 180 capsule 3  . eplerenone (INSPRA) 50 MG tablet TAKE 1 TABLET BY MOUTH EVERY DAY 90 tablet 3  . furosemide (LASIX) 40 MG tablet TAKE 1-1.5 TABLETS (40-60 MG TOTAL) BY MOUTH SEE ADMIN INSTRUCTIONS. ALTERNATE BETWEEN 40 MG AND 60 MG DAILY 180 tablet 1  . multivitamin-iron-minerals-folic acid (CENTRUM) chewable tablet Chew 1 tablet by mouth daily.    . sacubitril-valsartan (ENTRESTO) 97-103 MG Take 1 tablet by mouth 2 (two) times daily. 180 tablet 3  . tiZANidine (ZANAFLEX) 2 MG tablet Take 2 mg by mouth 2 (two) times daily as needed.    Carlena Hurl 20 MG TABS tablet TAKE 1 TABLET BY MOUTH DAILY WITH SUPPER. 90 tablet 3   No current facility-administered medications for this encounter.   Allergies  Allergen Reactions  . Spironolactone     Gynecomastia  . Sulfa Antibiotics     Thrush and hives   Vitals:   07/22/19 1343  BP: (!) 90/50  Pulse: (!) 53  SpO2: 99%  Weight: 96.5 kg (212 lb 12.8 oz)    Wt Readings from Last 3 Encounters:  07/22/19 96.5 kg (212 lb 12.8 oz)  04/17/19 94.8 kg (209 lb)  11/08/18 97.1 kg (214 lb)    PHYSICAL EXAM: General: NAD Neck: No JVD, no thyromegaly or thyroid nodule.  Lungs: Clear to auscultation bilaterally with normal respiratory effort. CV: Nondisplaced PMI.  Heart regular S1/S2, no S3/S4, no murmur.  No peripheral edema.  No carotid bruit.  Normal pedal pulses.  Abdomen: Soft, nontender, no hepatosplenomegaly, no distention.  Skin: Intact without lesions or rashes.  Neurologic: Alert and oriented x 3.  Psych: Normal affect. Extremities: No clubbing or cyanosis.  HEENT: Normal.   ASSESSMENT & PLAN: 1. Chronic systolic HF: Nonischemic cardiomyopathy x years.  He has a Secondary school teacher ICD and is not a candidate for CRT due to narrow QRS.  7/16 LHC showed no significant CAD.  Most recent  echo in 10/20 with EF 40-45%, stable.  NYHA class I-II symptoms, not volume overloaded by exam or Corevue.  - Continue Lasix 60 mg daily alternating with 40 mg daily.  - Continue Coreg 25 mg BID - Continue Entresto 97/103 bid.   - Gynecomastia resolved off spironolactone.  Continue eplerenone 50 mg daily. BMET today.  - Next step will be SGLT2 inhibitor.  - Repeat echo in 6 months with followup.  2.  Atrial fibrillation: Paroxysmal.  Holding NSR on Tikosyn. QTc ok on today's ECG.  - BMET and Mg today.  - Continue Xarelto 20 mg daily. CBC today.  3. PVCs: Holter in 8/19 showed 8% PVCs.  This is unlikely to affect his cardiomyopathy.    BMET/Mg in 3 months, followup in  6 months with echo.   Loralie Champagne, MD  07/23/2019

## 2019-07-28 ENCOUNTER — Telehealth (HOSPITAL_COMMUNITY): Payer: Self-pay

## 2019-07-28 DIAGNOSIS — I5022 Chronic systolic (congestive) heart failure: Secondary | ICD-10-CM

## 2019-07-28 MED ORDER — FUROSEMIDE 40 MG PO TABS: 40.0000 mg | ORAL_TABLET | Freq: Every day | ORAL | 3 refills | Status: DC

## 2019-07-28 NOTE — Telephone Encounter (Signed)
-----   Message from Laurey Morale, MD sent at 07/23/2019 12:05 PM EDT ----- Low K diet, hold Lasix for a day then decrease to 40 mg once daily, followup BMET in 10 days.

## 2019-07-28 NOTE — Telephone Encounter (Signed)
Patient advised and verbalized understanding.med list updated to reflect changes,lab appt scheduled at [address],lab order entered   Orders Placed This Encounter  Procedures  . Basic Metabolic Panel (BMET)    Standing Status:   Future    Standing Expiration Date:   07/27/2020    Order Specific Question:   Release to patient    Answer:   Immediate   Meds ordered this encounter  Medications  . furosemide (LASIX) 40 MG tablet    Sig: Take 1 tablet (40 mg total) by mouth daily.    Dispense:  90 tablet    Refill:  3    Please cancel all previous orders for current medication. Change in dosage or pill size.

## 2019-08-06 ENCOUNTER — Other Ambulatory Visit: Payer: Self-pay | Admitting: Cardiology

## 2019-08-06 ENCOUNTER — Other Ambulatory Visit: Payer: Self-pay

## 2019-08-06 ENCOUNTER — Other Ambulatory Visit: Payer: BC Managed Care – PPO | Admitting: *Deleted

## 2019-08-06 DIAGNOSIS — I5022 Chronic systolic (congestive) heart failure: Secondary | ICD-10-CM

## 2019-08-07 LAB — BASIC METABOLIC PANEL
BUN/Creatinine Ratio: 20 (ref 10–24)
BUN: 32 mg/dL — ABNORMAL HIGH (ref 8–27)
CO2: 17 mmol/L — ABNORMAL LOW (ref 20–29)
Calcium: 9.1 mg/dL (ref 8.6–10.2)
Chloride: 101 mmol/L (ref 96–106)
Creatinine, Ser: 1.62 mg/dL — ABNORMAL HIGH (ref 0.76–1.27)
GFR calc Af Amer: 51 mL/min/{1.73_m2} — ABNORMAL LOW (ref >59)
GFR calc non Af Amer: 44 mL/min/{1.73_m2} — ABNORMAL LOW (ref >59)
Glucose: 188 mg/dL — ABNORMAL HIGH (ref 65–99)
Potassium: 4.9 mmol/L (ref 3.5–5.2)
Sodium: 136 mmol/L (ref 134–144)

## 2019-08-14 ENCOUNTER — Encounter: Payer: Self-pay | Admitting: Podiatry

## 2019-08-14 ENCOUNTER — Ambulatory Visit (INDEPENDENT_AMBULATORY_CARE_PROVIDER_SITE_OTHER): Payer: Medicare Other

## 2019-08-14 ENCOUNTER — Ambulatory Visit (INDEPENDENT_AMBULATORY_CARE_PROVIDER_SITE_OTHER): Payer: BC Managed Care – PPO | Admitting: Podiatry

## 2019-08-14 ENCOUNTER — Other Ambulatory Visit: Payer: Self-pay

## 2019-08-14 DIAGNOSIS — M79671 Pain in right foot: Secondary | ICD-10-CM | POA: Diagnosis not present

## 2019-08-14 DIAGNOSIS — M2041 Other hammer toe(s) (acquired), right foot: Secondary | ICD-10-CM

## 2019-08-14 DIAGNOSIS — M79672 Pain in left foot: Secondary | ICD-10-CM | POA: Diagnosis not present

## 2019-08-14 DIAGNOSIS — M2042 Other hammer toe(s) (acquired), left foot: Secondary | ICD-10-CM

## 2019-08-14 DIAGNOSIS — M7751 Other enthesopathy of right foot: Secondary | ICD-10-CM | POA: Diagnosis not present

## 2019-08-14 DIAGNOSIS — M779 Enthesopathy, unspecified: Secondary | ICD-10-CM

## 2019-08-15 NOTE — Progress Notes (Signed)
Subjective:   Patient ID: Terry [name], male   DOB: 64 y.o.   MRN: [id number]   HPI Patient presents stating he has had a lot of pain in his ankles of both feet he has severe digital deformities bilateral but they are not significantly sore and he has advanced rheumatoid arthritis that is treated.  Patient does not smoke likes to be active and is concerned about the appearance of his feet and discomfort experienced   Review of Systems  All other systems reviewed and are negative.       Objective:  Physical Exam Vitals and nursing note reviewed.  Constitutional:      Appearance: He is well-developed.  Pulmonary:     Effort: Pulmonary effort is normal.  Musculoskeletal:        General: Normal range of motion.  Skin:    General: Skin is warm.  Neurological:     Mental Status: He is alert.     Neurovascular status found to be intact muscle strength was found to be adequate.  Patient does have diminished range of motion of the subtalar midtarsal joint bilateral severe foot structural changes collapse medial longitudinal arch with severe digital deformity and bunion deformity bilateral with prominent metatarsals but no breakdown of tissue or significant pain noted.  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  I do not recommend surgical intervention currently in this case as I think will be quite advanced and since he is having an absence of pain in his foot except for the ankle I would rather try to treated conservatively.  Today I injected the subtalar joint bilateral 3 mg Kenalog 5 mg Xylocaine and I explained that this can be done on a relatively routine basis and I gave instructions for shoe gear modifications.  Patient will be seen back as needed and we will make a decision on what would be  best and consideration for advanced imaging may be necessary  X-rays indicate severe foot structural issues with severe bunions hammertoe and classic rheumatoid type foot structure along with subtalar R ankle arthritis bilateral

## 2019-08-21 ENCOUNTER — Other Ambulatory Visit: Payer: Self-pay | Admitting: Podiatry

## 2019-08-21 DIAGNOSIS — M2042 Other hammer toe(s) (acquired), left foot: Secondary | ICD-10-CM

## 2019-09-09 ENCOUNTER — Ambulatory Visit (INDEPENDENT_AMBULATORY_CARE_PROVIDER_SITE_OTHER): Payer: BC Managed Care – PPO | Admitting: *Deleted

## 2019-09-09 DIAGNOSIS — I480 Paroxysmal atrial fibrillation: Secondary | ICD-10-CM | POA: Diagnosis not present

## 2019-09-10 ENCOUNTER — Other Ambulatory Visit: Payer: Self-pay | Admitting: Cardiology

## 2019-09-10 LAB — CUP PACEART REMOTE DEVICE CHECK
Battery Remaining Longevity: 61 mo
Battery Remaining Percentage: 60 %
Battery Voltage: 2.92 V
Brady Statistic RV Percent Paced: 1.5 %
Date Time Interrogation Session: 20210727020019
HighPow Impedance: 75 Ohm
HighPow Impedance: 75 Ohm
Implantable Lead Implant Date: 20160622
Implantable Lead Location: 753860
Implantable Lead Model: 181
Implantable Lead Serial Number: 331169
Implantable Pulse Generator Implant Date: 20160622
Lead Channel Impedance Value: 380 Ohm
Lead Channel Pacing Threshold Amplitude: 0.75 V
Lead Channel Pacing Threshold Pulse Width: 0.5 ms
Lead Channel Sensing Intrinsic Amplitude: 8.9 mV
Lead Channel Setting Pacing Amplitude: 2.5 V
Lead Channel Setting Pacing Pulse Width: 0.5 ms
Lead Channel Setting Sensing Sensitivity: 0.5 mV
Pulse Gen Serial Number: 7280026

## 2019-09-12 NOTE — Progress Notes (Signed)
Remote ICD transmission.   

## 2019-09-17 ENCOUNTER — Other Ambulatory Visit: Payer: Self-pay | Admitting: Cardiology

## 2019-09-18 MED ORDER — ENTRESTO 97-103 MG PO TABS: 1.0000 | ORAL_TABLET | Freq: Two times a day (BID) | ORAL | 2 refills | Status: DC

## 2019-10-16 NOTE — Progress Notes (Signed)
Date:  10/17/2019   ID:  Terry [name], DOB 1956/01/26, MRN [id number]   PCP:  Terry Blamer, MD  Cardiologist:  Terry Magic, MD Electrophysiologist: Terry Bunting, MD  Chief Complaint:  CHF, HTN, PAF, DCM  History of Present Illness:    Terry [name] a 64 y.o.malewith a history of chronic systolic CHF due to nonischemic cardiomyopathy s/p St Jude ICD 08/05/14, HTN, and persistent AF.Back in 2006, an echo showed EF 15-20%. LHC at that time showed normal coronaries. He has been managed for cardiomyopathy since that time. In 12/14, EF had improved to 50%. However, he then had a decline with echo in 5/16 showing EF 25%. Atrial fibrillation was first noted in 2013. In 7/15, he had DCCV to NSR. In 1/16, he was noted to be back in atrial fibrillation. He had St Jude ICD placed by Dr [name] in 6/16. Given fall in EF, Lexiscan Cardiolite was done. This showed EF 39% with partially reversible inferior and apical perfusion defect. He had LHC in 7/16 showing no significant CAD. In 8/16, he was admitted for Tikosyn initiation and converted to NSR. Echo in 8/18 showed EF 35-40%, mild to moderately decreased RV systolic function.  Echo in 8/19 showed EF 40-45%, mild-moderate MR, mild to moderately decreased RV systolic function, mild to moderate MR and dilated aortic root at 62mm and ascending aorta 90mm.Holter showed 8% PVCs by tele monitor. He is followed by EP for his ICD. Workup for DCM included HIV (neg), ferritin (normal) and no monoclonal Ab on SPEP/UPEP. He is followed as well by AHF service.    He is here today for followup and is doing well.  He denies any chest pain or pressure, SOB, DOE, PND, orthopnea,  dizziness, palpitations or syncope.  He has chronic LE edema which is controlled. He has been swimming everyday this summer for an hour and walks 2 miles daily as well with no problems. He works out in the yard with no problems.  He is compliant with his meds and is tolerating  meds with no SE.    Prior CV studies:   The following studies were reviewed today: none  Past Medical History:  Diagnosis Date   AICD (automatic cardioverter/defibrillator) present    Anemia    "S/P knee & hip OR"   Aortic insufficiency    mild    Aortic root dilatation (HCC)    Chronic systolic CHF (congestive heart failure), NYHA class 1 (HCC)    Complication of anesthesia    hx of irregular beat after anesthesia in ~ 1990's   GERD (gastroesophageal reflux disease)    H/O hematuria    H/O hiatal hernia    Hepatitis 1960's   "caught it from my brother"   History of blood transfusion 2013; 2014   S/P knee and hip OR   Hypertension    Nonischemic dilated cardiomyopathy (HCC)    EF 40-45% by echo 2019.  S/P AICD for primary prevention   PAF (paroxysmal atrial fibrillation) (HCC)    "off and on since 2013" (08/05/2014)   PVC (premature ventricular contraction)    PVC load 8%   Rheumatoid arthritis (HCC) dx'd 1995   Past Surgical History:  Procedure Laterality Date   CARDIAC CATHETERIZATION  ~ 2004   normal   CARDIAC CATHETERIZATION N/A 09/07/2014   Procedure: Left Heart Cath and Coronary Angiography;  Surgeon: Laurey Morale, MD;  Location: [facility] INVASIVE CV LAB;  Service: Cardiovascular;  Laterality: N/A;   CARDIAC DEFIBRILLATOR  PLACEMENT  08/05/2014   St. 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    JOINT REPLACEMENT     NASAL SEPTUM SURGERY  ~ 1975   TONSILLECTOMY     TOTAL HIP ARTHROPLASTY  09/08/2011   Procedure: TOTAL HIP ARTHROPLASTY ANTERIOR APPROACH;   Surgeon: [name], MD;  Location: [facility]S;  Service: Orthopedics;  Laterality: Right;  Right total hip replacement, left knee steroid injection   TOTAL KNEE ARTHROPLASTY Left 11/29/2012   Procedure: LEFT TOTAL KNEE ARTHROPLASTY;  Surgeon: [name], MD;  Location: [facility] ORS;  Service: Orthopedics;  Laterality: Left;  FEMORAL NERVE BLOCK IN HOLDING AREA LEFT LEG   TOTAL KNEE ARTHROPLASTY Right 11/30/2017   Procedure: RIGHT TOTAL KNEE ARTHROPLASTY;  Surgeon: [name], MD;  Location: [facility];  Service: Orthopedics;  Laterality: Right;   URETHRAL FISTULA REPAIR  ~ 1996   URETHRAL STRICTURE DILATATION  "several times"     Current Meds  Medication Sig   carvedilol (COREG) 25 MG tablet Take 1 tablet (25 mg total) by mouth 2 (two) times daily.   dofetilide (TIKOSYN) 250 MCG capsule Take 1 capsule (250 mcg total) by mouth 2 (two) times daily. Please call for office visit [phone]   eplerenone (INSPRA) 50 MG tablet TAKE 1 TABLET BY MOUTH EVERY DAY   furosemide (LASIX) 40 MG tablet Take 1 tablet (40 mg total) by mouth daily.   multivitamin-iron-minerals-folic acid (CENTRUM) chewable tablet Chew 1 tablet by mouth daily.   sacubitril-valsartan (ENTRESTO) 97-103 MG Take 1 tablet by mouth 2 (two) times daily.   XARELTO 20 MG TABS tablet TAKE 1 TABLET BY MOUTH DAILY WITH SUPPER.     Allergies:   Spironolactone and Sulfa antibiotics   Social History   Tobacco Use   Smoking status: Never Smoker   Smokeless tobacco: Never Used  Vaping Use   Vaping Use: Never used  Substance Use Topics   Alcohol use: Yes    Alcohol/week: 1.0 standard drink    Types: 1 Cans of beer per week   Drug use: No     Family Hx: The patient's family history includes Heart attack (age of onset: 72) in his father; Heart disease in his father; Heart failure in his brother.  ROS:   Please see the history of present illness.     All other systems reviewed and are  negative.   Labs/Other Tests and Data Reviewed:    Recent Labs: 07/22/2019: Hemoglobin 11.7; Magnesium 2.1; Platelets 246 08/06/2019: BUN 32; Creatinine, Ser 1.62; Potassium 4.9; Sodium 136   Recent Lipid Panel No results found for: CHOL, TRIG, HDL, CHOLHDL, LDLCALC, LDLDIRECT  Wt Readings from Last 3 Encounters:  10/17/19 211 lb (95.7 kg)  07/22/19 212 lb 12.8 oz (96.5 kg)  04/17/19 209 lb (94.8 kg)     Objective:    Vital Signs:  BP 110/76    Pulse (!) 59    Ht 5\' 11"  (1.803 m)    Wt 211 lb (95.7 kg)    SpO2 99%    BMI 29.43 kg/m  GEN: Well nourished, well developed in no acute distress HEENT: Normal NECK: No JVD; No carotid bruits LYMPHATICS: No lymphadenopathy CARDIAC:RRR, no murmurs, rubs, gallops RESPIRATORY:  Clear to auscultation without rales, wheezing or rhonchi  ABDOMEN: Soft, non-tender, non-distended MUSCULOSKELETAL:  No edema; No deformity  SKIN: Warm and dry NEUROLOGIC:  Alert and oriented x 3 PSYCHIATRIC:  Normal affect    ASSESSMENT & PLAN:    1.Chronic systolic CHF-  -He has stable NHYA Class I symptoms.  -He remains very active working out in his yard, swimming daily for an hour and walking 77mi daily -He did not tolerate spiro due to gynecomastia and is now on Eplerenone 50mg  daily.  -he appears euvolemic on exam and has not had any SOB -He will continue on Carvedilol 25mg  BID, Lasix 40 - 60mg  daily, Entresto 97-103mg  BID and eplerenone 50mg  daily. -SCr stable at 1.62 in June 2021 -EF 40-45% by echo 11/2018 on max guideline directed HF meds  2. HTN -BP controlled -continue Carvedilol, Entresto and Eplerenone  3. Paroxysmal atrial fibrillation -he is maintaining NSR and denies any palpitations -continue Carvedilol 25mg  BID and Tikosyn BID  -He is on Xarelto for a CHADS2VASC score of 3 (HTN, CHF and atherosclerosis of the aorta) -he has not had any bleeding problems on DOAC -Hbg stable at 11.7  4. PVCs  -Holter monitor  showed 8% PVC load and therefore likely not the etiology of his DCM. -continue BB -he denies any palpitations  5. Nonischemic DCM -Workup for secondary causes were negative (HIV/monoclonal Ab/normal ferritin).  -Cardiac cath 08/2014 with no CAD. -S/P AICD for primary prevention followed in device clinic by Dr. [name] - no events on recent ICD interrogation -Not a candidate for CRT due to narrow QRS.  6.  Dilated aortic root -mildly dilated on echo 11/2018 at 74mm -repeat echo in 1 year  Medication Adjustments/Labs and Tests Ordered: Current medicines are reviewed at length with the patient today.  Concerns regarding medicines are outlined above.  Tests Ordered: No orders of the defined types were placed in this encounter.  Medication Changes: No orders of the defined types were placed in this encounter.   Disposition:  Follow up in 6 month(s) with me  Signed, Terry Magic, MD  10/17/2019 9:36 AM    [facility]

## 2019-10-17 ENCOUNTER — Ambulatory Visit (INDEPENDENT_AMBULATORY_CARE_PROVIDER_SITE_OTHER): Payer: BC Managed Care – PPO | Admitting: Cardiology

## 2019-10-17 ENCOUNTER — Other Ambulatory Visit: Payer: Self-pay

## 2019-10-17 ENCOUNTER — Encounter: Payer: Self-pay | Admitting: Cardiology

## 2019-10-17 VITALS — BP 110/76 | HR 59 | Ht 71.0 in | Wt 211.0 lb

## 2019-10-17 DIAGNOSIS — I10 Essential (primary) hypertension: Secondary | ICD-10-CM | POA: Diagnosis not present

## 2019-10-17 DIAGNOSIS — I493 Ventricular premature depolarization: Secondary | ICD-10-CM | POA: Diagnosis not present

## 2019-10-17 DIAGNOSIS — I5022 Chronic systolic (congestive) heart failure: Secondary | ICD-10-CM

## 2019-10-17 DIAGNOSIS — I480 Paroxysmal atrial fibrillation: Secondary | ICD-10-CM

## 2019-10-17 DIAGNOSIS — I77810 Thoracic aortic ectasia: Secondary | ICD-10-CM

## 2019-10-17 DIAGNOSIS — I420 Dilated cardiomyopathy: Secondary | ICD-10-CM

## 2019-10-17 NOTE — Patient Instructions (Addendum)
Medication Instructions:  Your physician recommends that you continue on your current medications as directed. Please refer to the Current Medication list given to you today.  *If you need a refill on your cardiac medications before your next appointment, please call your pharmacy*   Testing/Procedures: Your physician has requested that you have an echocardiogram. Echocardiography is a painless test that uses sound waves to create images of your heart. It provides your doctor with information about the size and shape of your heart and how well your heart's chambers and valves are working. This procedure takes approximately one hour. There are no restrictions for this procedure.  Follow-Up: At [facility], you and your health needs are our priority.  As part of our continuing mission to provide you with exceptional heart care, we have created designated Provider Care Teams.  These Care Teams include your primary Cardiologist (physician) and Advanced Practice Providers (APPs -  Physician Assistants and Nurse Practitioners) who all work together to provide you with the care you need, when you need it.  Your next appointment:   6 month(s)  The format for your next appointment:   In Person  Provider:   You may see Traci Turner, MD or one of the following Advanced Practice Providers on your designated Care Team:    Dayna Dunn, PA-C  Michele Lenze, PA-C   

## 2019-10-22 ENCOUNTER — Other Ambulatory Visit: Payer: Self-pay

## 2019-10-22 ENCOUNTER — Other Ambulatory Visit: Payer: BC Managed Care – PPO | Admitting: *Deleted

## 2019-10-22 DIAGNOSIS — I5022 Chronic systolic (congestive) heart failure: Secondary | ICD-10-CM

## 2019-10-22 LAB — BASIC METABOLIC PANEL
Anion gap: 10 (ref 5–15)
BUN: 28 mg/dL — ABNORMAL HIGH (ref 8–23)
CO2: 24 mmol/L (ref 22–32)
Calcium: 9.3 mg/dL (ref 8.9–10.3)
Chloride: 102 mmol/L (ref 98–111)
Creatinine, Ser: 1.57 mg/dL — ABNORMAL HIGH (ref 0.61–1.24)
GFR calc Af Amer: 53 mL/min — ABNORMAL LOW (ref >60)
GFR calc non Af Amer: 46 mL/min — ABNORMAL LOW (ref >60)
Glucose, Bld: 81 mg/dL (ref 70–99)
Potassium: 4.6 mmol/L (ref 3.5–5.1)
Sodium: 136 mmol/L (ref 135–145)

## 2019-10-22 LAB — MAGNESIUM: Magnesium: 2 mg/dL (ref 1.6–2.3)

## 2019-10-31 ENCOUNTER — Other Ambulatory Visit: Payer: Self-pay | Admitting: Cardiology

## 2019-10-31 NOTE — Telephone Encounter (Signed)
Prescription refill request for Xarelto received.   Last office visit: Turner, 10/17/2019 Weight: 95.7 kg  Age: 64 y.o. Scr: 1.57, 10/22/2019 CrCl: 64 ml/min   Prescription refill sent.

## 2019-11-11 ENCOUNTER — Other Ambulatory Visit: Payer: Self-pay | Admitting: Nurse Practitioner

## 2019-11-11 ENCOUNTER — Encounter: Payer: Self-pay | Admitting: Internal Medicine

## 2019-11-11 ENCOUNTER — Other Ambulatory Visit: Payer: Self-pay

## 2019-11-11 ENCOUNTER — Ambulatory Visit (INDEPENDENT_AMBULATORY_CARE_PROVIDER_SITE_OTHER): Payer: BC Managed Care – PPO | Admitting: Internal Medicine

## 2019-11-11 VITALS — BP 110/60 | HR 72 | Ht 71.0 in | Wt 208.0 lb

## 2019-11-11 DIAGNOSIS — I480 Paroxysmal atrial fibrillation: Secondary | ICD-10-CM

## 2019-11-11 DIAGNOSIS — I5022 Chronic systolic (congestive) heart failure: Secondary | ICD-10-CM | POA: Diagnosis not present

## 2019-11-11 DIAGNOSIS — Z95810 Presence of automatic (implantable) cardiac defibrillator: Secondary | ICD-10-CM

## 2019-11-11 DIAGNOSIS — I493 Ventricular premature depolarization: Secondary | ICD-10-CM

## 2019-11-11 MED ORDER — ENTRESTO 97-103 MG PO TABS: 1.0000 | ORAL_TABLET | Freq: Two times a day (BID) | ORAL | 2 refills | Status: DC

## 2019-11-11 NOTE — Progress Notes (Signed)
HPI Terry [name] returns today for followup. He is a pleasant 64 yo man with a non-ischemic CM, persistent atrial fib, s/p ICD insertion. He denies palpitations, chest pain or sob. He has been swimming regularly. He notes an uptick in his cough since his Sherryll Burger was uptitrated. He has not had syncope. No ICD shock.   Allergies  Allergen Reactions  . Spironolactone     Gynecomastia  . Sulfa Antibiotics     Thrush and hives     Current Outpatient Medications  Medication Sig Dispense Refill  . carvedilol (COREG) 25 MG tablet Take 1 tablet (25 mg total) by mouth 2 (two) times daily. 180 tablet 2  . dofetilide (TIKOSYN) 250 MCG capsule Take 1 capsule (250 mcg total) by mouth 2 (two) times daily. Please call for office visit [phone] 180 capsule 3  . eplerenone (INSPRA) 50 MG tablet TAKE 1 TABLET BY MOUTH EVERY DAY 90 tablet 3  . furosemide (LASIX) 40 MG tablet Take 1 tablet (40 mg total) by mouth daily. 90 tablet 3  . multivitamin-iron-minerals-folic acid (CENTRUM) chewable tablet Chew 1 tablet by mouth daily.    . sacubitril-valsartan (ENTRESTO) 97-103 MG Take 1 tablet by mouth 2 (two) times daily. 180 tablet 2  . XARELTO 20 MG TABS tablet TAKE 1 TABLET BY MOUTH DAILY WITH SUPPER. 90 tablet 1   No current facility-administered medications for this visit.     Past Medical History:  Diagnosis Date  . AICD (automatic cardioverter/defibrillator) present   . Anemia    "S/P knee & hip OR"  . Aortic insufficiency    mild   . Aortic root dilatation (HCC)   . Chronic systolic CHF (congestive heart failure), NYHA class 1 (HCC)   . Complication of anesthesia    hx of irregular beat after anesthesia in ~ 1990's  . GERD (gastroesophageal reflux disease)   . H/O hematuria   . H/O hiatal hernia   . 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 History of blood transfusion 2013; 2014   S/P knee and hip OR  . Hypertension   . Nonischemic dilated cardiomyopathy (HCC)    EF  40-45% by echo 2019.  S/P AICD for primary prevention  . PAF (paroxysmal atrial fibrillation) (HCC)    "off and on since 2013" (08/05/2014)  . PVC (premature ventricular contraction)    PVC load 8%  . Rheumatoid arthritis (HCC) dx'd 1995    ROS:   All systems reviewed and negative except as noted in the HPI.   Past Surgical History:  Procedure Laterality Date  . CARDIAC CATHETERIZATION  ~ 2004   normal  . CARDIAC CATHETERIZATION N/A 09/07/2014   Procedure: Left Heart Cath and Coronary Angiography;  Surgeon: Laurey Morale, MD;  Location: [facility] INVASIVE CV LAB;  Service: Cardiovascular;  Laterality: N/A;  . CARDIAC DEFIBRILLATOR PLACEMENT  08/05/2014   St. Jude  . CARDIOVERSION N/A 08/21/2013   Procedure: CARDIOVERSION;  Surgeon: Quintella Reichert, MD;  Location: [facility] ENDOSCOPY;  Service: Cardiovascular;  Laterality: N/A;  . COLONOSCOPY  2010; 2015   "polyps; no polyps"  . EP IMPLANTABLE DEVICE N/A 08/05/2014   Procedure: ICD Implant;  Surgeon: [name], MD;  Location: [facility] INVASIVE CV LAB;  Service: Cardiovascular;  Laterality: N/A;  . 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 JOINT REPLACEMENT    . NASAL SEPTUM SURGERY  ~ 1975  . TONSILLECTOMY    .  TOTAL HIP ARTHROPLASTY  09/08/2011   Procedure: TOTAL HIP ARTHROPLASTY ANTERIOR APPROACH;  Surgeon: [name], MD;  Location: [facility];  Service: Orthopedics;  Laterality: Right;  Right total hip replacement, left knee steroid injection  . TOTAL KNEE ARTHROPLASTY Left 11/29/2012   Procedure: LEFT TOTAL KNEE ARTHROPLASTY;  Surgeon: [name], MD;  Location: [facility];  Service: Orthopedics;  Laterality: Left;  FEMORAL NERVE BLOCK IN HOLDING AREA LEFT LEG  . TOTAL KNEE ARTHROPLASTY Right 11/30/2017   Procedure: RIGHT TOTAL KNEE ARTHROPLASTY;  Surgeon: [name], MD;  Location: [facility];  Service: Orthopedics;  Laterality: Right;  . URETHRAL FISTULA REPAIR  ~ 1996  . URETHRAL STRICTURE DILATATION   "several times"     Family History  Problem Relation Age of Onset  . Heart disease Father   . Heart attack Father 89  . Heart failure Brother      Social History   Socioeconomic History  . Marital status: Married    Spouse name: Not on file  . Number of children: Not on file  . Years of education: Not on file  . Highest education level: Not on file  Occupational History  . Not on file  Tobacco Use  . Smoking status: Never Smoker  . Smokeless tobacco: Never Used  Vaping Use  . Vaping Use: Never used  Substance and Sexual Activity  . Alcohol use: Yes    Alcohol/week: 1.0 standard drink    Types: 1 Cans of beer per week  . Drug use: No  . Sexual activity: Yes  Other Topics Concern  . Not on file  Social History Narrative  . Not on file   Social Determinants of Health   Financial Resource Strain:   . Difficulty of Paying Living Expenses: Not on file  Food Insecurity:   . Worried About Programme researcher, broadcasting/film/video in the Last Year: Not on file  . Ran Out of Food in the Last Year: Not on file  Transportation Needs:   . Lack of Transportation (Medical): Not on file  . Lack of Transportation (Non-Medical): Not on file  Physical Activity:   . Days of Exercise per Week: Not on file  . Minutes of Exercise per Session: Not on file  Stress:   . Feeling of Stress : Not on file  Social Connections:   . Frequency of Communication with Friends and Family: Not on file  . Frequency of Social Gatherings with Friends and Family: Not on file  . Attends Religious Services: Not on file  . Active Member of Clubs or Organizations: Not on file  . Attends Banker Meetings: Not on file  . Marital Status: Not on file  Intimate Partner Violence:   . Fear of Current or Ex-Partner: Not on file  . Emotionally Abused: Not on file  . Physically Abused: Not on file  . Sexually Abused: Not on file     BP 110/60   Pulse 72   Ht 5\' 11"  (1.803 m)   Wt 208 lb (94.3 kg)   SpO2 95%    BMI 29.01 kg/m   Physical Exam:  Well appearing NAD HEENT: Unremarkable Neck:  No JVD, no thyromegally Lymphatics:  No adenopathy Back:  No CVA tenderness Lungs:  Clear with no wheezes HEART:  Regular rate rhythm, no murmurs, no rubs, no clicks Abd:  soft, positive bowel sounds, no organomegally, no rebound, no guarding Ext:  2 plus pulses, no edema, no cyanosis, no clubbing  Skin:  No rashes no nodules Neuro:  CN II through XII intact, motor grossly intact  EKG - NSR with PVC's  DEVICE  Normal device function.  See PaceArt for details.   Assess/Plan: 1. Chronic systolic heart failure - his symptoms remain class 2A. He will continue his current meds. 2. ICD - his St. Jude single chamber ICD is working normally. 3. Persistent atrial fib - he is maintaining NSR on dofetilide. 4. Coags - he has not had any bleeding on xarelto. 5. Overweight - I encouraged him to get under 200, ideally around 190.  Sharlot Gowda Jean Alejos,MD

## 2019-11-11 NOTE — Patient Instructions (Signed)
Medication Instructions:  Your physician recommends that you continue on your current medications as directed. Please refer to the Current Medication list given to you today.  Labwork: None ordered.  Testing/Procedures: None ordered.  Follow-Up: Your physician wants you to follow-up in: one year with Dr. [name].   You will receive a reminder letter in the mail two months in advance. If you don't receive a letter, please call our office to schedule the follow-up appointment.  Remote monitoring is used to monitor your ICD from home. This monitoring reduces the number of office visits required to check your device to one time per year. It allows [place] to keep an eye on the functioning of your device to ensure it is working properly. You are scheduled for a device check from home on 12/09/2019. You may send your transmission at any time that day. If you have a wireless device, the transmission will be sent automatically. After your physician reviews your transmission, you will receive a postcard with your next transmission date.  Any Other Special Instructions Will Be Listed Below (If Applicable).  If you need a refill on your cardiac medications before your next appointment, please call your pharmacy.

## 2019-12-08 ENCOUNTER — Other Ambulatory Visit: Payer: Self-pay | Admitting: Cardiology

## 2019-12-09 ENCOUNTER — Ambulatory Visit (INDEPENDENT_AMBULATORY_CARE_PROVIDER_SITE_OTHER): Payer: BC Managed Care – PPO

## 2019-12-09 DIAGNOSIS — I420 Dilated cardiomyopathy: Secondary | ICD-10-CM

## 2019-12-09 DIAGNOSIS — I5022 Chronic systolic (congestive) heart failure: Secondary | ICD-10-CM

## 2019-12-11 LAB — CUP PACEART REMOTE DEVICE CHECK
Battery Remaining Longevity: 59 mo
Battery Remaining Percentage: 59 %
Battery Voltage: 2.9 V
Brady Statistic RV Percent Paced: 2.4 %
Date Time Interrogation Session: 20211028124321
HighPow Impedance: 61 Ohm
HighPow Impedance: 61 Ohm
Implantable Lead Implant Date: 20160622
Implantable Lead Location: 753860
Implantable Lead Model: 181
Implantable Lead Serial Number: 331169
Implantable Pulse Generator Implant Date: 20160622
Lead Channel Impedance Value: 360 Ohm
Lead Channel Pacing Threshold Amplitude: 0.75 V
Lead Channel Pacing Threshold Pulse Width: 0.5 ms
Lead Channel Sensing Intrinsic Amplitude: 6.2 mV
Lead Channel Setting Pacing Amplitude: 2.5 V
Lead Channel Setting Pacing Pulse Width: 0.5 ms
Lead Channel Setting Sensing Sensitivity: 0.5 mV
Pulse Gen Serial Number: 7280026

## 2019-12-15 NOTE — Progress Notes (Signed)
Remote ICD transmission.   

## 2019-12-16 ENCOUNTER — Encounter (HOSPITAL_COMMUNITY): Payer: Self-pay

## 2020-01-05 ENCOUNTER — Telehealth (HOSPITAL_COMMUNITY): Payer: Self-pay | Admitting: Pharmacist

## 2020-01-05 NOTE — Telephone Encounter (Signed)
Advanced Heart Failure Patient Advocate Encounter  Prior Authorization for dofetilide has been approved.    Effective dates: 01/01/20 through 12/31/20  Karle Plumber, PharmD, BCPS, BCCP, CPP Heart Failure Clinic Pharmacist [phone]

## 2020-01-19 ENCOUNTER — Ambulatory Visit (HOSPITAL_COMMUNITY): Payer: BC Managed Care – PPO | Attending: Cardiology

## 2020-01-19 ENCOUNTER — Other Ambulatory Visit: Payer: Self-pay

## 2020-01-19 DIAGNOSIS — Z950 Presence of cardiac pacemaker: Secondary | ICD-10-CM | POA: Insufficient documentation

## 2020-01-19 DIAGNOSIS — I4891 Unspecified atrial fibrillation: Secondary | ICD-10-CM | POA: Diagnosis not present

## 2020-01-19 DIAGNOSIS — Z8249 Family history of ischemic heart disease and other diseases of the circulatory system: Secondary | ICD-10-CM | POA: Insufficient documentation

## 2020-01-19 DIAGNOSIS — I110 Hypertensive heart disease with heart failure: Secondary | ICD-10-CM | POA: Diagnosis not present

## 2020-01-19 DIAGNOSIS — I77810 Thoracic aortic ectasia: Secondary | ICD-10-CM | POA: Insufficient documentation

## 2020-01-19 DIAGNOSIS — I509 Heart failure, unspecified: Secondary | ICD-10-CM | POA: Insufficient documentation

## 2020-01-19 LAB — ECHOCARDIOGRAM COMPLETE
Area-P 1/2: 2.13 cm2
P 1/2 time: 615 msec
S' Lateral: 3.7 cm

## 2020-01-20 ENCOUNTER — Telehealth: Payer: Self-pay

## 2020-01-20 ENCOUNTER — Ambulatory Visit (HOSPITAL_COMMUNITY)
Admission: RE | Admit: 2020-01-20 | Discharge: 2020-01-20 | Disposition: A | Discharge status: Home / Self Care | Payer: BC Managed Care – PPO | Source: Ambulatory Visit | Attending: Cardiology | Admitting: Cardiology

## 2020-01-20 ENCOUNTER — Encounter: Payer: Self-pay | Admitting: Cardiology

## 2020-01-20 VITALS — BP 102/60 | HR 66 | Wt 209.0 lb

## 2020-01-20 DIAGNOSIS — N183 Chronic kidney disease, stage 3 unspecified: Secondary | ICD-10-CM | POA: Diagnosis not present

## 2020-01-20 DIAGNOSIS — I480 Paroxysmal atrial fibrillation: Secondary | ICD-10-CM | POA: Diagnosis not present

## 2020-01-20 DIAGNOSIS — I712 Thoracic aortic aneurysm, without rupture, unspecified: Secondary | ICD-10-CM

## 2020-01-20 DIAGNOSIS — Z95810 Presence of automatic (implantable) cardiac defibrillator: Secondary | ICD-10-CM | POA: Insufficient documentation

## 2020-01-20 DIAGNOSIS — I130 Hypertensive heart and chronic kidney disease with heart failure and stage 1 through stage 4 chronic kidney disease, or unspecified chronic kidney disease: Secondary | ICD-10-CM | POA: Diagnosis present

## 2020-01-20 DIAGNOSIS — I420 Dilated cardiomyopathy: Secondary | ICD-10-CM | POA: Diagnosis not present

## 2020-01-20 DIAGNOSIS — Z882 Allergy status to sulfonamides status: Secondary | ICD-10-CM | POA: Insufficient documentation

## 2020-01-20 DIAGNOSIS — Z7984 Long term (current) use of oral hypoglycemic drugs: Secondary | ICD-10-CM | POA: Insufficient documentation

## 2020-01-20 DIAGNOSIS — I493 Ventricular premature depolarization: Secondary | ICD-10-CM | POA: Diagnosis not present

## 2020-01-20 DIAGNOSIS — I7121 Aneurysm of the ascending aorta, without rupture: Secondary | ICD-10-CM | POA: Insufficient documentation

## 2020-01-20 DIAGNOSIS — I5022 Chronic systolic (congestive) heart failure: Secondary | ICD-10-CM | POA: Insufficient documentation

## 2020-01-20 DIAGNOSIS — Z8249 Family history of ischemic heart disease and other diseases of the circulatory system: Secondary | ICD-10-CM | POA: Diagnosis not present

## 2020-01-20 DIAGNOSIS — Z79899 Other long term (current) drug therapy: Secondary | ICD-10-CM | POA: Insufficient documentation

## 2020-01-20 DIAGNOSIS — Z7901 Long term (current) use of anticoagulants: Secondary | ICD-10-CM | POA: Diagnosis not present

## 2020-01-20 LAB — BASIC METABOLIC PANEL
Anion gap: 12 (ref 5–15)
BUN: 28 mg/dL — ABNORMAL HIGH (ref 8–23)
CO2: 21 mmol/L — ABNORMAL LOW (ref 22–32)
Calcium: 9.2 mg/dL (ref 8.9–10.3)
Chloride: 100 mmol/L (ref 98–111)
Creatinine, Ser: 1.71 mg/dL — ABNORMAL HIGH (ref 0.61–1.24)
GFR, Estimated: 44 mL/min — ABNORMAL LOW (ref >60)
Glucose, Bld: 100 mg/dL — ABNORMAL HIGH (ref 70–99)
Potassium: 4.9 mmol/L (ref 3.5–5.1)
Sodium: 133 mmol/L — ABNORMAL LOW (ref 135–145)

## 2020-01-20 LAB — CBC
HCT: 36.8 % — ABNORMAL LOW (ref 39.0–52.0)
Hemoglobin: 11.7 g/dL — ABNORMAL LOW (ref 13.0–17.0)
MCH: 27.7 pg (ref 26.0–34.0)
MCHC: 31.8 g/dL (ref 30.0–36.0)
MCV: 87.2 fL (ref 80.0–100.0)
Platelets: 262 10*3/uL (ref 150–400)
RBC: 4.22 MIL/uL (ref 4.22–5.81)
RDW: 13.6 % (ref 11.5–15.5)
WBC: 8.8 10*3/uL (ref 4.0–10.5)
nRBC: 0 % (ref 0.0–0.2)

## 2020-01-20 LAB — MAGNESIUM: Magnesium: 2.1 mg/dL (ref 1.7–2.4)

## 2020-01-20 MED ORDER — FUROSEMIDE 20 MG PO TABS: 20.0000 mg | ORAL_TABLET | Freq: Every day | ORAL | 3 refills | Status: DC

## 2020-01-20 MED ORDER — DAPAGLIFLOZIN PROPANEDIOL 10 MG PO TABS
10.0000 mg | ORAL_TABLET | Freq: Every day | ORAL | 3 refills | Status: DC
Start: 2020-01-20 — End: 2020-11-29

## 2020-01-20 NOTE — Telephone Encounter (Signed)
-----   Message from Quintella Reichert, MD sent at 01/20/2020  9:47 AM EST ----- Echo showed moderately reduced LVF with EF 35-40% and increased stiffness of heart muscle, moderately dilated RV with normal RV function, biatrial enlargement, mildly leaky MV and AV and mildly dilated ascending aorta at 7mm.  Please get non contrasted Chest CT to assess ascending aorta and entire thoracic aorta further.  Heart function appears fairly stable

## 2020-01-20 NOTE — Patient Instructions (Signed)
START Marcelline Deist 10mg  (1 tablet) Daily  DECREASE Lasix 20mg  (1 tablet) Daily  Labs done today, your results will be available in MyChart, we will contact you for abnormal readings.  Your physician recommends that you schedule repeat labs in 7-10 days  Your physician recommends that you schedule a follow-up appointment in: 6 months. Please call our office in May to schedule.  If you have any questions or concerns before your next appointment please send 9-10 a message through [place] or call our office at [phone].    TO LEAVE A MESSAGE FOR THE NURSE SELECT OPTION 2, PLEASE LEAVE A MESSAGE INCLUDING: . YOUR NAME . DATE OF BIRTH . CALL BACK NUMBER . REASON FOR CALL**this is important as we prioritize the call backs  YOU WILL RECEIVE A CALL BACK THE SAME DAY AS LONG AS YOU CALL BEFORE 4:00 PM

## 2020-01-20 NOTE — Progress Notes (Signed)
Patient ID: Terry [name], male   DOB: [birth year], 64 y.o.   MRN: [id number] Primary Care: [name], MD Primary Cardiologist: Armanda Magic, MD HF Cardiologist: Dr [name]  HPI: Terry [name] is a 64 y.o. 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 Back in 2006, an echo showed EF 15-20%.  LHC at that time showed normal coronaries.  He has been managed for cardiomyopathy since that time. In 12/14, EF had improved to 50%.  However, he has had a decline since then.  Last echo in 5/16 showed EF 25%.  Atrial fibrillation was first noted in 2013.  In 7/15, he had DCCV to NSR.  In 1/16, he was noted to be back in atrial fibrillation and appears to have been in atrial fibrillation since that time. He had St Jude ICD placed by Dr [name] in 6/16.  Given fall in EF, Lexiscan Cardiolite was done. This showed EF 39% with partially reversible inferior and apical perfusion defect. He had LHC in 7/16 showing no significant CAD.  In 8/16, he was admitted for Tikosyn initiation and converted to NSR.  Echo in 8/18 showed EF 35-40%, mild to moderately decreased RV systolic function.  Echo in 8/19 showed EF 40-45%, mild-moderate MR, mild to moderately decreased RV systolic function.   Holter in 8/19 showed 8% PVCs.   He had right TKR in 10/19 without complications.   Echo in 10/20 showed EF stable at 40-45%. Echo in 12/21 showed EF 35-40%, moderate RV enlargement with normal systolic function, 4.2 cm ascending aorta.   He presents today for followup of CHF.  He is able to do heavy yardwork like leaf-blowing and weed-eating without dyspnea.  No dyspnea walking on flat ground or up a flight of stairs.  No palpitations.  No orthopnea/PND.  Weight down 3 lbs.    St Jude device interrogation: thoracic impedance recently decreased, now back up to baseline.  2% v-pacing.   HIV negative, ferritin normal, immunofixation with no monoclonal  protein.  Labs (6/16): K 4.2, Creatinine 1.08, HCT 37.5 Labs (08/18/14): K 4.2, Creatinine 1.0 Labs (8/16): K 4.7, creatinine 1.18 Labs (9/16): Mg 2.1, creatinine 1.16, K 4.6 Labs (6/18): K 4.4, creatinine 1.25 Labs (9/18): K 4.4, creatinine 1.24 Labs (12/18): K 4.1, creatinine 1.2, hgb 11.6 Labs (10/19): K 4.3, creatinine 1.0 Labs (3/21): K 4.7, creatinine 1.52 Labs (9/21): K 4.6, creaetinine 1.57  PMH 1. Nonischemic dilated cardiomyopathy: Echo (2006) with EF 15-20%.  LHC in 10/06 showed normal coronaries.  By 2014, EF had improved to 50%.  Echo in 1/16 showed EF 35-40%.  Echo (5/16) showed EF 25% with mild MR.  St Jude ICD 6/16.  HIV negative, immunofixation with no monoclonal protein, and ferritin normal.  Lexiscan Cardiolite (7/16) with EF 39% and partially reversible inferior and apical perfusion defect (intermediate risk).   LHC (7/16) with EF 35-40%, no significant CAD.  - Echo (8/17): EF 45-50% - Echo (8/18): EF 35-40%, moderate diastoilc dysfunction, mild AI, mild MR, mild to moderately decreased RV systolic function.   - Echo (8/19): EF 40-45%, mild-moderate MR, mild to moderately decreased RV systolic function.  - Echo (10/20): EF 40-45%, mild LVH, normal RV, mild MR.  - Echo (12/21): EF 35-40%, moderate RV enlargement with normal systolic function, 4.2 cm ascending aorta.  2. Atrial fibrillation: Paroxysmal.  First noted in 2013.  DCCV in 7/15.  Back in atrial fibrillation 1/16.  Converted back to NSR with Tikosyn in 8/16.  3. HTN 4. Rheumatoid Arthritis: Has been off all medications for several years.   5. H/o bilateral THR 6. Gynecomastia with spironolactone.  7. PVCs: Holter in 8/19 showed 8% PVCs.  8. Ascending aortic aneurysm: 4.2 cm ascending aorta on 12/21 echo.   SH: Lives at home with wife and 2 children and a grandchild. Never smoker, drinks 1 or 2 beers a week with dinner. 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   FH: Father with MI at 83,  brother with atrial fibrillation, mother with renal failure.   Review of systems complete and found to be negative unless listed in HPI.    Current Outpatient Medications  Medication Sig Dispense Refill  . carvedilol (COREG) 25 MG tablet TAKE 1 TABLET BY MOUTH TWICE A DAY 180 tablet 3  . dofetilide (TIKOSYN) 250 MCG capsule Take 1 capsule (250 mcg total) by mouth 2 (two) times daily. Please call for office visit [phone] 180 capsule 3  . eplerenone (INSPRA) 50 MG tablet TAKE 1 TABLET BY MOUTH EVERY DAY 90 tablet 3  . furosemide (LASIX) 20 MG tablet Take 1 tablet (20 mg total) by mouth daily. 30 tablet 3  . multivitamin-iron-minerals-folic acid (CENTRUM) chewable tablet Chew 1 tablet by mouth daily.    . sacubitril-valsartan (ENTRESTO) 97-103 MG Take 1 tablet by mouth 2 (two) times daily. 180 tablet 2  . XARELTO 20 MG TABS tablet TAKE 1 TABLET BY MOUTH DAILY WITH SUPPER. 90 tablet 1  . dapagliflozin propanediol (FARXIGA) 10 MG TABS tablet Take 1 tablet (10 mg total) by mouth daily before breakfast. 90 tablet 3   No current facility-administered medications for this encounter.   Allergies  Allergen Reactions  . Spironolactone     Gynecomastia  . Sulfa Antibiotics     Thrush and hives   Vitals:   01/20/20 1131  BP: 102/60  Pulse: 66  SpO2: 100%  Weight: 94.8 kg (209 lb)    Wt Readings from Last 3 Encounters:  01/20/20 94.8 kg (209 lb)  11/11/19 94.3 kg (208 lb)  10/17/19 95.7 kg (211 lb)    PHYSICAL EXAM: General: NAD Neck: No JVD, no thyromegaly or thyroid nodule.  Lungs: Clear to auscultation bilaterally with normal respiratory effort. CV: Nondisplaced PMI.  Heart regular S1/S2, no S3/S4, no murmur.  Trace ankle edema.  No carotid bruit.  Normal pedal pulses.  Abdomen: Soft, nontender, no hepatosplenomegaly, no distention.  Skin: Intact without lesions or rashes.  Neurologic: Alert and oriented x 3.  Psych: Normal affect. Extremities: No clubbing or cyanosis.  HEENT:  Normal.   ASSESSMENT & PLAN: 1. Chronic systolic HF: Nonischemic cardiomyopathy x years.  He has a Secondary school teacher ICD and is not a candidate for CRT due to narrow QRS.  7/16 LHC showed no significant CAD.  Most recent echo in 12/21 with EF 35-40%, fairly stable.  NYHA class I-II symptoms, not volume overloaded by exam or Corevue.  - I will have him start dapagliflozin and decrease Lasix to 20 mg daily.  BMET today and in 10 days.   - Continue Coreg 25 mg BID - Continue Entresto 97/103 bid.   - Gynecomastia resolved off spironolactone.  Continue eplerenone 50 mg daily. BMET today.  2.  Atrial fibrillation: Paroxysmal.  Holding NSR on Tikosyn.  - Check ECG for QT interval.  - BMET and Mg today.  - Continue Xarelto 20 mg daily. CBC today.  3. PVCs:  Holter in 8/19 showed 8% PVCs.  This is unlikely to affect his cardiomyopathy.   4. CKD: Stage 3.   - As above, starting dapagliflozin.   Followup in 6 months.   Marca Ancona, MD  01/20/2020

## 2020-01-20 NOTE — Telephone Encounter (Signed)
Orders have been placed and patient is aware.

## 2020-01-22 ENCOUNTER — Telehealth: Payer: Self-pay

## 2020-01-22 NOTE — Telephone Encounter (Signed)
Will send to [facility] pool, since patient is not having CT done at [facility].

## 2020-01-22 NOTE — Telephone Encounter (Signed)
Patient left a voicemail stating he was trying to contact the ct scan department to schedule his ct scan. His phone number is [phone].

## 2020-01-27 ENCOUNTER — Other Ambulatory Visit: Payer: Self-pay

## 2020-01-27 ENCOUNTER — Other Ambulatory Visit: Payer: Self-pay | Admitting: *Deleted

## 2020-01-27 ENCOUNTER — Other Ambulatory Visit: Payer: BC Managed Care – PPO | Admitting: *Deleted

## 2020-01-27 ENCOUNTER — Other Ambulatory Visit (HOSPITAL_COMMUNITY): Payer: BC Managed Care – PPO

## 2020-01-27 DIAGNOSIS — I351 Nonrheumatic aortic (valve) insufficiency: Secondary | ICD-10-CM

## 2020-01-27 LAB — BASIC METABOLIC PANEL
BUN/Creatinine Ratio: 14 (ref 10–24)
BUN: 24 mg/dL (ref 8–27)
CO2: 22 mmol/L (ref 20–29)
Calcium: 9 mg/dL (ref 8.6–10.2)
Chloride: 102 mmol/L (ref 96–106)
Creatinine, Ser: 1.75 mg/dL — ABNORMAL HIGH (ref 0.76–1.27)
GFR calc Af Amer: 47 mL/min/{1.73_m2} — ABNORMAL LOW (ref >59)
GFR calc non Af Amer: 40 mL/min/{1.73_m2} — ABNORMAL LOW (ref >59)
Glucose: 97 mg/dL (ref 65–99)
Potassium: 4.4 mmol/L (ref 3.5–5.2)
Sodium: 137 mmol/L (ref 134–144)

## 2020-01-28 ENCOUNTER — Ambulatory Visit (HOSPITAL_COMMUNITY)
Admission: RE | Admit: 2020-01-28 | Discharge: 2020-01-28 | Disposition: A | Discharge status: Home / Self Care | Payer: BC Managed Care – PPO | Source: Ambulatory Visit | Attending: Cardiology | Admitting: Cardiology

## 2020-01-28 DIAGNOSIS — I712 Thoracic aortic aneurysm, without rupture, unspecified: Secondary | ICD-10-CM

## 2020-01-29 ENCOUNTER — Encounter: Payer: Self-pay | Admitting: Cardiology

## 2020-01-29 DIAGNOSIS — I700 Atherosclerosis of aorta: Secondary | ICD-10-CM | POA: Insufficient documentation

## 2020-01-30 ENCOUNTER — Telehealth: Payer: Self-pay

## 2020-01-30 DIAGNOSIS — I7121 Aneurysm of the ascending aorta, without rupture: Secondary | ICD-10-CM

## 2020-01-30 DIAGNOSIS — I480 Paroxysmal atrial fibrillation: Secondary | ICD-10-CM

## 2020-01-30 DIAGNOSIS — I77810 Thoracic aortic ectasia: Secondary | ICD-10-CM

## 2020-01-30 DIAGNOSIS — R911 Solitary pulmonary nodule: Secondary | ICD-10-CM

## 2020-01-30 DIAGNOSIS — I700 Atherosclerosis of aorta: Secondary | ICD-10-CM

## 2020-01-30 NOTE — Telephone Encounter (Signed)
Orders placed for lab work. Referral placed for pulmonology.

## 2020-01-30 NOTE — Telephone Encounter (Signed)
Spoke with the patient and reviewed results. He will come in on Monday 12/20 for lab work.

## 2020-01-30 NOTE — Telephone Encounter (Signed)
-----   Message from Oretha Milch, RN sent at 01/29/2020  4:46 PM EST -----  ----- Message ----- From: Quintella Reichert, MD Sent: 01/29/2020  12:23 PM EST To: [name], MD, Cv Div [facility] St Triage  Chest CT showed stable dilatation of the ascending aorta at 4.2cm ( 4.2cm a year ago).  There are scattered nodules at the bases of the lungs that may be related to chronic infection.  There are also gallstones.  Please forward this to his PCP and refer to Dr. Marchelle Gearing with Pulmonary as lung findings need further workup.  He also has aortic atherosclerosis so please have him come in for FLP and ALT.  Repeat Chest CT in 1 year to followup on dilated aorta

## 2020-02-02 ENCOUNTER — Other Ambulatory Visit: Payer: BC Managed Care – PPO

## 2020-02-09 ENCOUNTER — Telehealth: Payer: Self-pay

## 2020-02-09 DIAGNOSIS — Z79899 Other long term (current) drug therapy: Secondary | ICD-10-CM

## 2020-02-09 DIAGNOSIS — I77810 Thoracic aortic ectasia: Secondary | ICD-10-CM

## 2020-02-09 MED ORDER — ATORVASTATIN CALCIUM 20 MG PO TABS
20.0000 mg | ORAL_TABLET | Freq: Every day | ORAL | 3 refills | Status: DC
Start: 2020-02-09 — End: 2020-12-16

## 2020-02-09 NOTE — Telephone Encounter (Signed)
Lab results received from patient's PCP and LDL is 137.  Per Dr. Mayford Knife patient's LDL goal is preferably <70 due to dilated aorta, patient should start on Lipitor 20 mg daily and recheck FLP and ALT in 6 weeks.

## 2020-02-09 NOTE — Telephone Encounter (Signed)
error 

## 2020-02-09 NOTE — Telephone Encounter (Signed)
The patient has been notified of the result and verbalized understanding.  All questions (if any) were answered. Rx has been sent in for Lipitor 20 mg daily. FU labs have been scheduled.

## 2020-02-20 ENCOUNTER — Other Ambulatory Visit: Payer: Self-pay | Admitting: Cardiology

## 2020-02-24 DIAGNOSIS — J209 Acute bronchitis, unspecified: Secondary | ICD-10-CM | POA: Diagnosis not present

## 2020-02-27 ENCOUNTER — Encounter (HOSPITAL_COMMUNITY): Payer: Medicare Other | Admitting: Cardiology

## 2020-03-09 ENCOUNTER — Ambulatory Visit (INDEPENDENT_AMBULATORY_CARE_PROVIDER_SITE_OTHER): Payer: Medicare Other

## 2020-03-09 DIAGNOSIS — I480 Paroxysmal atrial fibrillation: Secondary | ICD-10-CM | POA: Diagnosis not present

## 2020-03-09 LAB — CUP PACEART REMOTE DEVICE CHECK
Battery Remaining Longevity: 58 mo
Battery Remaining Percentage: 56 %
Battery Voltage: 2.9 V
Brady Statistic RV Percent Paced: 3 %
Date Time Interrogation Session: 20220125072018
HighPow Impedance: 64 Ohm
HighPow Impedance: 64 Ohm
Implantable Lead Implant Date: 20160622
Implantable Lead Location: 753860
Implantable Lead Model: 181
Implantable Lead Serial Number: 331169
Implantable Pulse Generator Implant Date: 20160622
Lead Channel Impedance Value: 380 Ohm
Lead Channel Pacing Threshold Amplitude: 0.75 V
Lead Channel Pacing Threshold Pulse Width: 0.5 ms
Lead Channel Sensing Intrinsic Amplitude: 6.6 mV
Lead Channel Setting Pacing Amplitude: 2.5 V
Lead Channel Setting Pacing Pulse Width: 0.5 ms
Lead Channel Setting Sensing Sensitivity: 0.5 mV
Pulse Gen Serial Number: 7280026

## 2020-03-20 NOTE — Progress Notes (Signed)
Remote ICD transmission.   

## 2020-03-22 ENCOUNTER — Other Ambulatory Visit: Payer: Self-pay

## 2020-03-22 ENCOUNTER — Other Ambulatory Visit: Payer: Medicare Other | Admitting: *Deleted

## 2020-03-22 DIAGNOSIS — I77810 Thoracic aortic ectasia: Secondary | ICD-10-CM | POA: Diagnosis not present

## 2020-03-22 DIAGNOSIS — Z79899 Other long term (current) drug therapy: Secondary | ICD-10-CM | POA: Diagnosis not present

## 2020-03-22 LAB — LIPID PANEL
Chol/HDL Ratio: 3.5 ratio (ref 0.0–5.0)
Cholesterol, Total: 111 mg/dL (ref 100–199)
HDL: 32 mg/dL — ABNORMAL LOW (ref >39)
LDL Chol Calc (NIH): 58 mg/dL (ref 0–99)
Triglycerides: 114 mg/dL (ref 0–149)
VLDL Cholesterol Cal: 21 mg/dL (ref 5–40)

## 2020-03-22 LAB — ALT: ALT: 7 IU/L (ref 0–44)

## 2020-03-26 ENCOUNTER — Other Ambulatory Visit: Payer: Self-pay

## 2020-03-26 ENCOUNTER — Encounter: Payer: Self-pay | Admitting: Internal Medicine

## 2020-03-26 ENCOUNTER — Ambulatory Visit: Payer: Medicare Other | Admitting: Internal Medicine

## 2020-03-26 VITALS — BP 128/74 | HR 48 | Temp 97.3°F | Ht 71.0 in | Wt 210.2 lb

## 2020-03-26 DIAGNOSIS — R918 Other nonspecific abnormal finding of lung field: Secondary | ICD-10-CM | POA: Diagnosis not present

## 2020-03-26 DIAGNOSIS — Z8739 Personal history of other diseases of the musculoskeletal system and connective tissue: Secondary | ICD-10-CM | POA: Diagnosis not present

## 2020-03-26 NOTE — Patient Instructions (Signed)
ICD-10-CM   1. Multiple lung nodules on CT  R91.8   2. History of rheumatoid arthritis  Z87.39    Likely non specific but could be due to RA or smoldering low grade infections like MAC Could be acid reflux too but you do not have that history Cough could be from this but more likely from [facility]  - close monitoring - repeat CT chest without contrast - in June/july 2022  Followup  - summer 2022 after CT

## 2020-03-26 NOTE — Progress Notes (Signed)
OV 03/26/2020  Subjective:  Patient ID: Terry [name], male , DOB: 26-Oct-1955 , age 65 y.o. , MRN: [id number] , ADDRESS: [place] [address] PCP Shirline Frees, MD Patient Care Team: Shirline Frees, MD as PCP - General (Family Medicine) Sueanne Margarita, MD as PCP - Cardiology (Cardiology)  This Provider for this visit: Treatment Team:  Attending Provider: Brand Males, MD    03/26/2020 -   Chief Complaint  Patient presents with  . Consult     HPI Terry [name] 65 y.o. -non-smoker but with a long history of rheumatoid arthritis.  He is not on any immunomodulators for over 15 years.  He feels his quality of life is better with the immunomodulators.  He keeps himself physically active.  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  He had a CT scan of the chest for this that showed nonspecific nodules.  I personally visualized this and agree with this.  Some of these are scattered groundglass opacities some of them are tree-in-bud.  Particularly in the right lower lobe and also in the esophageal recess.  He denies any postnasal drip or acid reflux.  He denies any shortness of breath.  Of note he has cough that is chronic for the last 1 year.  He has been on Entresto for a few years.  A year ago Dr. Loralie Champagne his cardiologist increase the [facility] and therefore his cough got worse.  His cough is rated at a scale of 3 out of 10 both early in the morning and late at night.  Early morning is particularly more.  Nevertheless overall the burden is mild.  There is no sputum production.  There is no shortness of breath or wheezing.    CT Chest data -January 28, 2020 personally visualized.  And agree with the findings.   IMPRESSION: 1. Ascending thoracic aorta approximately 4.2 cm maximal caliber. Recommend annual imaging followup by CTA or MRA. This recommendation follows 2010 ACCF/AHA/AATS/ACR/ASA/SCA/SCAI/SIR/STS/SVM Guidelines for the Diagnosis and  Management of Patients with Thoracic Aortic Disease. Circulation. 2010; 121: E332-R518. Aortic aneurysm NOS (ICD10-I71.9) 2. Scattered areas of tree-in-bud nodularity at the bases. More focal collection of nodules and ground-glass in the as ago esophageal recess. Findings are likely related to chronic infection. Largest nodule approximately 5 mm. Some of these areas show ground-glass features. Non-contrast chest CT at 3-6 months is recommended. If nodules persist and are stable at that time, consider additional non-contrast chest CT examinations at 2 and 4 years. This recommendation follows the consensus statement: Guidelines for Management of Incidental Pulmonary Nodules Detected on CT Images: From the Fleischner Society 2017; Radiology 2017; 284:228-243. 3. Cholelithiasis without evidence of acute cholecystitis. 4. Aortic atherosclerosis.  Aortic Atherosclerosis (ICD10-I70.0).   Electronically Signed   By: Zetta Bills M.D.   has a past medical history of AICD (automatic cardioverter/defibrillator) present, Anemia, Aortic atherosclerosis ([place]), Aortic insufficiency, Ascending aorta dilation ([place]), Chronic systolic CHF (congestive heart failure), NYHA class 1 ([place]), Complication of anesthesia, GERD (gastroesophageal reflux disease), H/O hematuria, H/O hiatal hernia, Hepatitis (1960's), History of blood transfusion (2013; 2014), Hypertension, Nonischemic dilated cardiomyopathy ([place]), PAF (paroxysmal atrial fibrillation) ([place]), PVC (premature ventricular contraction), and Rheumatoid arthritis ([place]) (dx'd 1995).   reports that he has never smoked. He has never used smokeless tobacco.  Past Surgical History:  Procedure Laterality Date  . CARDIAC CATHETERIZATION  ~ 2004   normal  . CARDIAC CATHETERIZATION N/A 09/07/2014   Procedure: Left Heart  Cath and Coronary Angiography;  Surgeon: Larey Dresser, MD;  Location: [facility];  Service: Cardiovascular;  Laterality: N/A;  .  CARDIAC DEFIBRILLATOR PLACEMENT  08/05/2014   St. Jude  . CARDIOVERSION N/A 08/21/2013   Procedure: CARDIOVERSION;  Surgeon: Sueanne Margarita, MD;  Location: [facility];  Service: Cardiovascular;  Laterality: N/A;  . COLONOSCOPY  2010; 2015   "polyps; no polyps"  . EP IMPLANTABLE DEVICE N/A 08/05/2014   Procedure: ICD Implant;  Surgeon: Evans Lance, MD;  Location: [facility];  Service: Cardiovascular;  Laterality: N/A;  . 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 JOINT REPLACEMENT    . NASAL SEPTUM SURGERY  ~ 1975  . TONSILLECTOMY    . TOTAL HIP ARTHROPLASTY  09/08/2011   Procedure: TOTAL HIP ARTHROPLASTY ANTERIOR APPROACH;  Surgeon: Mcarthur Rossetti, MD;  Location: [facility];  Service: Orthopedics;  Laterality: Right;  Right total hip replacement, left knee steroid injection  . TOTAL KNEE ARTHROPLASTY Left 11/29/2012   Procedure: LEFT TOTAL KNEE ARTHROPLASTY;  Surgeon: Mcarthur Rossetti, MD;  Location: [facility];  Service: Orthopedics;  Laterality: Left;  FEMORAL NERVE BLOCK IN HOLDING AREA LEFT LEG  . TOTAL KNEE ARTHROPLASTY Right 11/30/2017   Procedure: RIGHT TOTAL KNEE ARTHROPLASTY;  Surgeon: Mcarthur Rossetti, MD;  Location: [facility];  Service: Orthopedics;  Laterality: Right;  . URETHRAL FISTULA REPAIR  ~ 1996  . URETHRAL STRICTURE DILATATION  "several times"    Allergies  Allergen Reactions  . Other Other (See Comments)  . Spironolactone     Gynecomastia  . Sulfa Antibiotics     Thrush and hives    Immunization History  Administered Date(s) Administered  . Influenza Split 11/30/2008, 10/26/2011, 10/18/2013, 08/06/2014, 10/11/2014, 10/22/2015, 09/30/2016, 09/28/2018, 09/28/2019  . Influenza,inj,Quad PF,6+ Mos 09/30/2016, 10/06/2017, 09/28/2018  . PFIZER(Purple Top)SARS-COV-2 Vaccination 04/30/2019, 05/21/2019, 11/23/2019  . Pneumococcal Polysaccharide-23 08/06/2014  . Td 12/20/2016  . Tdap 11/26/2006  . Zoster 12/14/2015, 10/26/2018,  12/28/2018  . Zoster Recombinat (Shingrix) 10/26/2018, 12/28/2018    Family History  Problem Relation Age of Onset  . Heart disease Father   . Heart attack Father 66  . Heart failure Brother      Current Outpatient Medications:  .  atorvastatin (LIPITOR) 20 MG tablet, Take 1 tablet (20 mg total) by mouth daily., Disp: 90 tablet, Rfl: 3 .  carvedilol (COREG) 25 MG tablet, TAKE 1 TABLET BY MOUTH TWICE A DAY, Disp: 180 tablet, Rfl: 3 .  dapagliflozin propanediol (FARXIGA) 10 MG TABS tablet, Take 1 tablet (10 mg total) by mouth daily before breakfast., Disp: 90 tablet, Rfl: 3 .  dofetilide (TIKOSYN) 250 MCG capsule, TAKE 1 CAPSULE (250 MCG TOTAL) BY MOUTH 2 TIMES DAILY. PLEASE CALL FOR OFFICE VISIT [phone], Disp: 60 capsule, Rfl: 11 .  eplerenone (INSPRA) 50 MG tablet, TAKE 1 TABLET BY MOUTH EVERY DAY, Disp: 90 tablet, Rfl: 3 .  furosemide (LASIX) 20 MG tablet, Take 1 tablet (20 mg total) by mouth daily. (Patient taking differently: Take 40 mg by mouth daily.), Disp: 30 tablet, Rfl: 3 .  multivitamin-iron-minerals-folic acid (CENTRUM) chewable tablet, Chew 1 tablet by mouth daily., Disp: , Rfl:  .  sacubitril-valsartan (ENTRESTO) 97-103 MG, Take 1 tablet by mouth 2 (two) times daily., Disp: 180 tablet, Rfl: 2 .  XARELTO 20 MG TABS tablet, TAKE 1 TABLET BY MOUTH DAILY WITH SUPPER., Disp: 90 tablet, Rfl: 1      Objective:  Vitals:   03/26/20 0900  BP: 128/74  Pulse: (!) 48  Temp: (!) 97.3 F (36.3 C)  TempSrc: Oral  SpO2: 98%  Weight: 210 lb 3.2 oz (95.3 kg)  Height: _0  (1.803 m)    Estimated body mass index is 29.32 kg/m as calculated from the following:   Height as of this encounter: _1  (1.803 m).   Weight as of this encounter: 210 lb 3.2 oz (95.3 kg).  _2 @  [employer]   03/26/20 0900  Weight: 210 lb 3.2 oz (95.3 kg)     Physical Exam  General Appearance:    Alert, cooperative, no distress, appears stated age - yes , Deconditioned looking -  no , OBESE  - no, Sitting on Wheelchair -  no  Head:    Normocephalic, without obvious abnormality, atraumatic  Eyes:    PERRL, conjunctiva/corneas clear,  Ears:    Normal TM's and external ear canals, both ears  Nose:   Nares normal, septum midline, mucosa normal, no drainage    or sinus tenderness. OXYGEN ON  - no . Patient is @ ra   Throat:   Lips, mucosa, and tongue normal; teeth and gums normal. Cyanosis on lips - no  Neck:   Supple, symmetrical, trachea midline, no adenopathy;    thyroid:  no enlargement/tenderness/nodules; no carotid   bruit or JVD  Back:     Symmetric, no curvature, ROM normal, no CVA tenderness  Lungs:     Distress - no , Wheeze no, Barrell Chest - no, Purse lip breathing - no, Crackles - no   Chest Wall:    No tenderness or deformity.    Heart:    Regular rate and rhythm, S1 and S2 normal, no rub   or gallop, Murmur - no  Breast Exam:    NOT DONE  Abdomen:     Soft, non-tender, bowel sounds active all four quadrants,    no masses, no organomegaly. Visceral obesity - yes  Genitalia:   NOT DONE  Rectal:   NOT DONE  Extremities:   Extremities - normal, Has Cane - no, Clubbing - no, Edema - no  Pulses:   2+ and symmetric all extremities  Skin:   Stigmata of Connective Tissue Disease - RA  Lymph nodes:   Cervical, supraclavicular, and axillary nodes normal  Psychiatric:  Neurologic:   Pleasant - yes, Anxious - no, Flat affect - no  CAm-ICU - neg, Alert and Oriented x 3 - yes, Moves all 4s - yes, Speech - normal, Cognition - intact        Assessment:       ICD-10-CM   1. Multiple lung nodules on CT  R91.8   2. History of rheumatoid arthritis  Z87.39    This could be rheumatoid nodules.  Could be MAI.  Could be nonspecific.  There is no evidence of ILD.  Because he is not on immunomodulators I do not see the need for bronchoscopy at this point.  Close monitoring is warranted however.    Plan:     Patient Instructions     ICD-10-CM   1. Multiple lung  nodules on CT  R91.8   2. History of rheumatoid arthritis  Z87.39    Likely non specific but could be due to RA or smoldering low grade infections like MAC Could be acid reflux too but you do not have that history Cough could be from this but more likely from [facility]  Plan  -  close monitoring - repeat CT chest without contrast - in June/july 2022  Followup  - summer 2022 after CT     SIGNATURE    Dr. Brand Males, M.D., F.C.C.P,  Pulmonary and Critical Care Medicine Staff Physician, [place] Director - Interstitial Lung Disease  Program  Pulmonary [place] [facility] [place], [place], [address]  Pager: [phone], If no answer or between  15:00h - 7:00h: call [phone] Telephone: [phone]  9:33 AM 03/26/2020

## 2020-04-14 ENCOUNTER — Ambulatory Visit: Payer: Medicare Other | Admitting: Cardiology

## 2020-04-14 ENCOUNTER — Other Ambulatory Visit: Payer: Self-pay

## 2020-04-14 ENCOUNTER — Encounter: Payer: Self-pay | Admitting: Cardiology

## 2020-04-14 ENCOUNTER — Other Ambulatory Visit (HOSPITAL_COMMUNITY): Payer: Self-pay | Admitting: Cardiology

## 2020-04-14 VITALS — BP 116/68 | HR 62 | Ht 71.0 in | Wt 211.0 lb

## 2020-04-14 DIAGNOSIS — I493 Ventricular premature depolarization: Secondary | ICD-10-CM | POA: Diagnosis not present

## 2020-04-14 DIAGNOSIS — I10 Essential (primary) hypertension: Secondary | ICD-10-CM | POA: Diagnosis not present

## 2020-04-14 DIAGNOSIS — I5022 Chronic systolic (congestive) heart failure: Secondary | ICD-10-CM | POA: Diagnosis not present

## 2020-04-14 DIAGNOSIS — I420 Dilated cardiomyopathy: Secondary | ICD-10-CM

## 2020-04-14 DIAGNOSIS — E7800 Pure hypercholesterolemia, unspecified: Secondary | ICD-10-CM

## 2020-04-14 DIAGNOSIS — M069 Rheumatoid arthritis, unspecified: Secondary | ICD-10-CM

## 2020-04-14 DIAGNOSIS — I480 Paroxysmal atrial fibrillation: Secondary | ICD-10-CM | POA: Diagnosis not present

## 2020-04-14 DIAGNOSIS — I77810 Thoracic aortic ectasia: Secondary | ICD-10-CM

## 2020-04-14 NOTE — Patient Instructions (Signed)

## 2020-04-14 NOTE — Progress Notes (Signed)
Date:  04/14/2020   ID:  Terry [name], DOB 17-Apr-1955, MRN [id number]   PCP:  [name], MD  Cardiologist:  Armanda Magic, MD Electrophysiologist: Lewayne Bunting, MD  Chief Complaint:  CHF, HTN, PAF, DCM  History of Present Illness:    Terry [name] a 65 y.o.malewith a history of chronic systolic CHF due to nonischemic cardiomyopathy s/p St Jude ICD 08/05/14, HTN, and persistent AF.Back in 2006, an echo showed EF 15-20%. LHC at that time showed normal coronaries. He has been managed for cardiomyopathy since that time. In 12/14, EF had improved to 50%. However, he then had a decline with echo in 5/16 showing EF 25%. Atrial fibrillation was first noted in 2013. In 7/15, he had DCCV to NSR. In 1/16, he was noted to be back in atrial fibrillation. He had St Jude ICD placed by Dr [name] in 6/16. Given fall in EF, Lexiscan Cardiolite was done. This showed EF 39% with partially reversible inferior and apical perfusion defect. He had LHC in 7/16 showing no significant CAD. In 8/16, he was admitted for Tikosyn initiation and converted to NSR. Echo in 8/18 showed EF 35-40%, mild to moderately decreased RV systolic function.  Echo in 8/19 showed EF 40-45%, mild-moderate MR, mild to moderately decreased RV systolic function, mild to moderate MR and dilated aortic root at 36mm and ascending aorta 44mm.Holter showed 8% PVCs by tele monitor. He is followed by EP for his ICD. Workup for DCM included HIV (neg), ferritin (normal) and no monoclonal Ab on SPEP/UPEP. He is followed as well by AHF service.    He is here today for followup and is doing well.  He denies any chest pain or pressure, SOB, DOE, PND, orthopnea,  dizziness, palpitations or syncope.  He has chronic LE edema which is controlled on diuretics. He mows the yard and walks 2 miles daily as well with no problems. He works out in the yard with no problems.  He is compliant with his meds and is tolerating meds with no SE.     Prior CV studies:   The following studies were reviewed today:  2D echo 01/2020 IMPRESSIONS   1. Left ventricular ejection fraction, by estimation, is 35 to 40%. The  left ventricle has moderately decreased function. The left ventricle  demonstrates global hypokinesis. Left ventricular diastolic parameters are  consistent with Grade I diastolic  dysfunction (impaired relaxation).  2. Right ventricular systolic function is normal. The right ventricular  size is moderately enlarged. There is normal pulmonary artery systolic  pressure. The estimated right ventricular systolic pressure is 25.5 mmHg.  3. Left atrial size was severely dilated.  4. Right atrial size was moderately dilated.  5. The mitral valve is normal in structure. Mild mitral valve  regurgitation. No evidence of mitral stenosis.  6. The aortic valve is tricuspid. Aortic valve regurgitation is mild. No  aortic stenosis is present.  7. Aortic dilatation noted. There is mild dilatation of the ascending  aorta, measuring 42 mm.  8. The inferior vena cava is normal in size with greater than 50%  respiratory variability, suggesting right atrial pressure of 3 mmHg.   Past Medical History:  Diagnosis Date  . AICD (automatic cardioverter/defibrillator) present   . Anemia    "S/P knee & hip OR"  . Aortic atherosclerosis (HCC)   . Aortic insufficiency    mild   . Ascending aorta dilation (HCC)    33mm by echo and CT 01/2020  . Chronic systolic  CHF (congestive heart failure), NYHA class 1 (HCC)   . Complication of anesthesia    hx of irregular beat after anesthesia in ~ 1990's  . GERD (gastroesophageal reflux disease)   . H/O hematuria   . H/O hiatal hernia   . 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 History of blood transfusion 2013; 2014   S/P knee and hip OR  . Hypertension   . Nonischemic dilated cardiomyopathy (HCC)    EF 40-45% by echo 2019.  S/P AICD for primary prevention.  Repeat EF  01/2020 35-40%.   Marland Kitchen PAF (paroxysmal atrial fibrillation) (HCC)    "off and on since 2013" (08/05/2014)  . PVC (premature ventricular contraction)    PVC load 8%  . Rheumatoid arthritis (HCC) dx'd 1995   Past Surgical History:  Procedure Laterality Date  . CARDIAC CATHETERIZATION  ~ 2004   normal  . CARDIAC CATHETERIZATION N/A 09/07/2014   Procedure: Left Heart Cath and Coronary Angiography;  Surgeon: Laurey Morale, MD;  Location: [facility] INVASIVE CV LAB;  Service: Cardiovascular;  Laterality: N/A;  . CARDIAC DEFIBRILLATOR PLACEMENT  08/05/2014   St. Jude  . CARDIOVERSION N/A 08/21/2013   Procedure: CARDIOVERSION;  Surgeon: Quintella Reichert, MD;  Location: [facility] ENDOSCOPY;  Service: Cardiovascular;  Laterality: N/A;  . COLONOSCOPY  2010; 2015   "polyps; no polyps"  . EP IMPLANTABLE DEVICE N/A 08/05/2014   Procedure: ICD Implant;  Surgeon: [name], MD;  Location: [facility] INVASIVE CV LAB;  Service: Cardiovascular;  Laterality: N/A;  . 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 JOINT REPLACEMENT    . NASAL SEPTUM SURGERY  ~ 1975  . TONSILLECTOMY    . TOTAL HIP ARTHROPLASTY  09/08/2011   Procedure: TOTAL HIP ARTHROPLASTY ANTERIOR APPROACH;  Surgeon: [name], MD;  Location: [facility] ORS;  Service: Orthopedics;  Laterality: Right;  Right total hip replacement, left knee steroid injection  . TOTAL KNEE ARTHROPLASTY Left 11/29/2012   Procedure: LEFT TOTAL KNEE ARTHROPLASTY;  Surgeon: [name], MD;  Location: [facility] ORS;  Service: Orthopedics;  Laterality: Left;  FEMORAL NERVE BLOCK IN HOLDING AREA LEFT LEG  . TOTAL KNEE ARTHROPLASTY Right 11/30/2017   Procedure: RIGHT TOTAL KNEE ARTHROPLASTY;  Surgeon: [name], MD;  Location: [facility] ORS;  Service: Orthopedics;  Laterality: Right;  . URETHRAL FISTULA REPAIR  ~ 1996  . URETHRAL STRICTURE DILATATION  "several times"     Current Meds  Medication Sig  . atorvastatin (LIPITOR) 20 MG tablet Take 1 tablet (20 mg  total) by mouth daily.  . carvedilol (COREG) 25 MG tablet TAKE 1 TABLET BY MOUTH TWICE A DAY  . dapagliflozin propanediol (FARXIGA) 10 MG TABS tablet Take 1 tablet (10 mg total) by mouth daily before breakfast.  . dofetilide (TIKOSYN) 250 MCG capsule TAKE 1 CAPSULE (250 MCG TOTAL) BY MOUTH 2 TIMES DAILY. PLEASE CALL FOR OFFICE VISIT [phone]  . eplerenone (INSPRA) 50 MG tablet TAKE 1 TABLET BY MOUTH EVERY DAY  . furosemide (LASIX) 40 MG tablet Take 40 mg by mouth daily.  . multivitamin-iron-minerals-folic acid (CENTRUM) chewable tablet Chew 1 tablet by mouth daily.  . sacubitril-valsartan (ENTRESTO) 97-103 MG Take 1 tablet by mouth 2 (two) times daily.  Carlena Hurl 20 MG TABS tablet TAKE 1 TABLET BY MOUTH DAILY WITH SUPPER.     Allergies:   Other, Spironolactone, and Sulfa antibiotics   Social History   Tobacco Use  .  Smoking status: Never Smoker  . Smokeless tobacco: Never Used  Vaping Use  . Vaping Use: Never used  Substance Use Topics  . Alcohol use: Yes    Alcohol/week: 1.0 standard drink    Types: 1 Cans of beer per week  . Drug use: No     Family Hx: The patient's family history includes Heart attack (age of onset: 76) in his father; Heart disease in his father; Heart failure in his brother.  ROS:   Please see the history of present illness.     All other systems reviewed and are negative.   Labs/Other Tests and Data Reviewed:    Recent Labs: 01/20/2020: Hemoglobin 11.7; Magnesium 2.1; Platelets 262 01/27/2020: BUN 24; Creatinine, Ser 1.75; Potassium 4.4; Sodium 137 03/22/2020: ALT 7   Recent Lipid Panel Lab Results  Component Value Date/Time   CHOL 111 03/22/2020 08:45 AM   TRIG 114 03/22/2020 08:45 AM   HDL 32 (L) 03/22/2020 08:45 AM   CHOLHDL 3.5 03/22/2020 08:45 AM   LDLCALC 58 03/22/2020 08:45 AM    Wt Readings from Last 3 Encounters:  04/14/20 211 lb (95.7 kg)  03/26/20 210 lb 3.2 oz (95.3 kg)  01/20/20 209 lb (94.8 kg)     Objective:    Vital  Signs:  BP 116/68   Pulse 62   Ht 5\' 11"  (1.803 m)   Wt 211 lb (95.7 kg)   SpO2 95%   BMI 29.43 kg/m    GEN: Well nourished, well developed in no acute distress HEENT: Normal NECK: No JVD; No carotid bruits LYMPHATICS: No lymphadenopathy CARDIAC:RRR, no murmurs, rubs, gallops RESPIRATORY:  Clear to auscultation without rales, wheezing or rhonchi  ABDOMEN: Soft, non-tender, non-distended MUSCULOSKELETAL:  No edema; No deformity  SKIN: Warm and dry NEUROLOGIC:  Alert and oriented x 3 PSYCHIATRIC:  Normal affect   ASSESSMENT & PLAN:    1.Chronic systolic CHF-  -He has stable NHYA Class I symptoms.  -He remains very active working out in his yard, walking 6mi daily -He did not tolerate spiro due to gynecomastia and is now on Eplerenone 50mg  daily.  -he does not appear volume overloaded on exam today -He will continue on Carvedilol 25mg  BID, Lasix 40mg  daily, Entresto 97-103mg  BID, eplerenone 50mg  daily and Farxiga 10mg  daily. -SCr stable at 1.75 in Dec 2021 -EF 35-40% by echo 01/2020 on max guideline directed HF meds  2. HTN -BP is well controlled on exam today -continue Carvedilol, Entresto 97-103mg  BID, Inspra 50mg  daily and Carvedilol 25mg  BID  3. Paroxysmal atrial fibrillation -he is maintaining NSR on exam today with no palpitations -continue Carvedilol 25mg  BID and Tikosyn BID  -He is on Xarelto for a CHADS2VASC score of 3 (HTN, CHF and atherosclerosis of the aorta) -he has not had any bleeding problems on DOAC -SCr 1.75 and Hbg 11.7 in Dec 2021 -CrCl normal at 57.73cc/min  4. PVCs  -Holter monitor showed 8% PVC load and therefore likely not the etiology of his DCM. -continue BB -he has not had any palpitations  5. Nonischemic DCM -Workup for secondary causes were negative (HIV/monoclonal Ab/normal ferritin).  -Cardiac cath 08/2014 with no CAD. -S/P AICD for primary prevention followed in device clinic by Dr. [name] - no events on recent  ICD interrogation -Not a candidate for CRT due to narrow QRS.  6.  Dilated ascending aorta -mildly dilated on echo 01/2020 at 20mm -repeat echo in 1 year  7.  HLD -LDL goal < 70  -LDL 58  in Feb 2022 -continue Atorvastatin 20mg  daily  8.  RA -he has pulmonary nodules followed by Pulmonary -he will have yearly echo to assess PA pressures  Medication Adjustments/Labs and Tests Ordered: Current medicines are reviewed at length with the patient today.  Concerns regarding medicines are outlined above.  Tests Ordered: No orders of the defined types were placed in this encounter.  Medication Changes: No orders of the defined types were placed in this encounter.   Disposition:  Follow up in 6 month(s) with me  Signed, Armanda Magic, MD  04/14/2020 9:33 AM    [facility]

## 2020-04-25 ENCOUNTER — Other Ambulatory Visit: Payer: Self-pay | Admitting: Cardiology

## 2020-04-25 ENCOUNTER — Other Ambulatory Visit (HOSPITAL_COMMUNITY): Payer: Self-pay | Admitting: Cardiology

## 2020-04-26 NOTE — Telephone Encounter (Signed)
Pt's age 65, wt 97.5 kg, SCr 1.75, CrCl 57.72, last ov w TT 04/14/20.

## 2020-06-01 ENCOUNTER — Other Ambulatory Visit (HOSPITAL_COMMUNITY): Payer: Self-pay | Admitting: Cardiology

## 2020-06-01 DIAGNOSIS — J019 Acute sinusitis, unspecified: Secondary | ICD-10-CM | POA: Diagnosis not present

## 2020-06-08 ENCOUNTER — Other Ambulatory Visit (HOSPITAL_COMMUNITY): Payer: Self-pay | Admitting: *Deleted

## 2020-06-08 ENCOUNTER — Ambulatory Visit (INDEPENDENT_AMBULATORY_CARE_PROVIDER_SITE_OTHER): Payer: Medicare Other

## 2020-06-08 DIAGNOSIS — I420 Dilated cardiomyopathy: Secondary | ICD-10-CM

## 2020-06-08 LAB — CUP PACEART REMOTE DEVICE CHECK
Battery Remaining Longevity: 54 mo
Battery Remaining Percentage: 54 %
Battery Voltage: 2.89 V
Brady Statistic RV Percent Paced: 3.4 %
Date Time Interrogation Session: 20220426073414
HighPow Impedance: 64 Ohm
HighPow Impedance: 64 Ohm
Implantable Lead Implant Date: 20160622
Implantable Lead Location: 753860
Implantable Lead Model: 181
Implantable Lead Serial Number: 331169
Implantable Pulse Generator Implant Date: 20160622
Lead Channel Impedance Value: 380 Ohm
Lead Channel Pacing Threshold Amplitude: 0.75 V
Lead Channel Pacing Threshold Pulse Width: 0.5 ms
Lead Channel Sensing Intrinsic Amplitude: 11.5 mV
Lead Channel Setting Pacing Amplitude: 2.5 V
Lead Channel Setting Pacing Pulse Width: 0.5 ms
Lead Channel Setting Sensing Sensitivity: 0.5 mV
Pulse Gen Serial Number: 7280026

## 2020-06-08 MED ORDER — EPLERENONE 50 MG PO TABS: 50.0000 mg | ORAL_TABLET | Freq: Every day | ORAL | 0 refills | Status: DC

## 2020-06-28 DIAGNOSIS — U071 COVID-19: Secondary | ICD-10-CM | POA: Diagnosis not present

## 2020-06-29 NOTE — Progress Notes (Signed)
Remote ICD transmission.   

## 2020-07-02 DIAGNOSIS — U071 COVID-19: Secondary | ICD-10-CM | POA: Diagnosis not present

## 2020-08-17 ENCOUNTER — Other Ambulatory Visit: Payer: Self-pay

## 2020-08-17 ENCOUNTER — Ambulatory Visit
Admission: RE | Admit: 2020-08-17 | Discharge: 2020-08-17 | Disposition: A | Discharge status: Home / Self Care | Payer: Medicare Other | Source: Ambulatory Visit | Attending: Internal Medicine | Admitting: Internal Medicine

## 2020-08-17 DIAGNOSIS — R918 Other nonspecific abnormal finding of lung field: Secondary | ICD-10-CM | POA: Diagnosis not present

## 2020-08-17 DIAGNOSIS — R911 Solitary pulmonary nodule: Secondary | ICD-10-CM | POA: Diagnosis not present

## 2020-08-17 DIAGNOSIS — I700 Atherosclerosis of aorta: Secondary | ICD-10-CM | POA: Diagnosis not present

## 2020-08-17 DIAGNOSIS — Z8739 Personal history of other diseases of the musculoskeletal system and connective tissue: Secondary | ICD-10-CM

## 2020-08-30 ENCOUNTER — Other Ambulatory Visit: Payer: Self-pay | Admitting: Cardiology

## 2020-09-07 ENCOUNTER — Ambulatory Visit (INDEPENDENT_AMBULATORY_CARE_PROVIDER_SITE_OTHER): Payer: Medicare Other

## 2020-09-07 DIAGNOSIS — I420 Dilated cardiomyopathy: Secondary | ICD-10-CM | POA: Diagnosis not present

## 2020-09-07 LAB — CUP PACEART REMOTE DEVICE CHECK
Battery Remaining Longevity: 53 mo
Battery Remaining Percentage: 52 %
Battery Voltage: 2.89 V
Brady Statistic RV Percent Paced: 3 %
Date Time Interrogation Session: 20220726020020
HighPow Impedance: 74 Ohm
HighPow Impedance: 74 Ohm
Implantable Lead Implant Date: 20160622
Implantable Lead Location: 753860
Implantable Lead Model: 181
Implantable Lead Serial Number: 331169
Implantable Pulse Generator Implant Date: 20160622
Lead Channel Impedance Value: 380 Ohm
Lead Channel Pacing Threshold Amplitude: 0.75 V
Lead Channel Pacing Threshold Pulse Width: 0.5 ms
Lead Channel Sensing Intrinsic Amplitude: 6.8 mV
Lead Channel Setting Pacing Amplitude: 2.5 V
Lead Channel Setting Pacing Pulse Width: 0.5 ms
Lead Channel Setting Sensing Sensitivity: 0.5 mV
Pulse Gen Serial Number: 7280026

## 2020-09-09 ENCOUNTER — Encounter (HOSPITAL_COMMUNITY): Payer: Self-pay | Admitting: Cardiology

## 2020-09-09 ENCOUNTER — Other Ambulatory Visit: Payer: Self-pay

## 2020-09-09 ENCOUNTER — Ambulatory Visit (HOSPITAL_COMMUNITY)
Admission: RE | Admit: 2020-09-09 | Discharge: 2020-09-09 | Disposition: A | Discharge status: Home / Self Care | Payer: Medicare Other | Source: Ambulatory Visit | Attending: Cardiology | Admitting: Cardiology

## 2020-09-09 VITALS — BP 90/58 | HR 61 | Wt 204.2 lb

## 2020-09-09 DIAGNOSIS — Z79899 Other long term (current) drug therapy: Secondary | ICD-10-CM | POA: Diagnosis not present

## 2020-09-09 DIAGNOSIS — N183 Chronic kidney disease, stage 3 unspecified: Secondary | ICD-10-CM | POA: Diagnosis not present

## 2020-09-09 DIAGNOSIS — N62 Hypertrophy of breast: Secondary | ICD-10-CM | POA: Insufficient documentation

## 2020-09-09 DIAGNOSIS — M069 Rheumatoid arthritis, unspecified: Secondary | ICD-10-CM | POA: Insufficient documentation

## 2020-09-09 DIAGNOSIS — Z8249 Family history of ischemic heart disease and other diseases of the circulatory system: Secondary | ICD-10-CM | POA: Insufficient documentation

## 2020-09-09 DIAGNOSIS — Z7984 Long term (current) use of oral hypoglycemic drugs: Secondary | ICD-10-CM | POA: Insufficient documentation

## 2020-09-09 DIAGNOSIS — I130 Hypertensive heart and chronic kidney disease with heart failure and stage 1 through stage 4 chronic kidney disease, or unspecified chronic kidney disease: Secondary | ICD-10-CM | POA: Insufficient documentation

## 2020-09-09 DIAGNOSIS — I420 Dilated cardiomyopathy: Secondary | ICD-10-CM | POA: Insufficient documentation

## 2020-09-09 DIAGNOSIS — Z882 Allergy status to sulfonamides status: Secondary | ICD-10-CM | POA: Insufficient documentation

## 2020-09-09 DIAGNOSIS — I712 Thoracic aortic aneurysm, without rupture: Secondary | ICD-10-CM | POA: Insufficient documentation

## 2020-09-09 DIAGNOSIS — I493 Ventricular premature depolarization: Secondary | ICD-10-CM | POA: Insufficient documentation

## 2020-09-09 DIAGNOSIS — I5022 Chronic systolic (congestive) heart failure: Secondary | ICD-10-CM | POA: Diagnosis not present

## 2020-09-09 DIAGNOSIS — Z7901 Long term (current) use of anticoagulants: Secondary | ICD-10-CM | POA: Insufficient documentation

## 2020-09-09 DIAGNOSIS — I480 Paroxysmal atrial fibrillation: Secondary | ICD-10-CM | POA: Diagnosis not present

## 2020-09-09 DIAGNOSIS — Z95810 Presence of automatic (implantable) cardiac defibrillator: Secondary | ICD-10-CM | POA: Diagnosis not present

## 2020-09-09 LAB — BASIC METABOLIC PANEL
Anion gap: 8 (ref 5–15)
BUN: 23 mg/dL (ref 8–23)
CO2: 26 mmol/L (ref 22–32)
Calcium: 9.4 mg/dL (ref 8.9–10.3)
Chloride: 102 mmol/L (ref 98–111)
Creatinine, Ser: 1.55 mg/dL — ABNORMAL HIGH (ref 0.61–1.24)
GFR, Estimated: 49 mL/min — ABNORMAL LOW (ref >60)
Glucose, Bld: 99 mg/dL (ref 70–99)
Potassium: 4.5 mmol/L (ref 3.5–5.1)
Sodium: 136 mmol/L (ref 135–145)

## 2020-09-09 NOTE — Progress Notes (Signed)
Patient ID: Terry [name], male   DOB: 1955/10/21, 65 y.o.   MRN: [id number] Primary Care: [name], MD Primary Cardiologist: Armanda Magic, MD HF Cardiologist: Dr [name]  HPI: Terry [name] is a 65 y.o. 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 Back in 2006, an echo showed EF 15-20%.  LHC at that time showed normal coronaries.  He has been managed for cardiomyopathy since that time. In 12/14, EF had improved to 50%.  However, he has had a decline since then.  Last echo in 5/16 showed EF 25%.  Atrial fibrillation was first noted in 2013.  In 7/15, he had DCCV to NSR.  In 1/16, he was noted to be back in atrial fibrillation and appears to have been in atrial fibrillation since that time. He had St Jude ICD placed by Dr [name] in 6/16.  Given fall in EF, Lexiscan Cardiolite was done. This showed EF 39% with partially reversible inferior and apical perfusion defect. He had LHC in 7/16 showing no significant CAD.  In 8/16, he was admitted for Tikosyn initiation and converted to NSR.  Echo in 8/18 showed EF 35-40%, mild to moderately decreased RV systolic function.  Echo in 8/19 showed EF 40-45%, mild-moderate MR, mild to moderately decreased RV systolic function.   Holter in 8/19 showed 8% PVCs.   He had right TKR in 10/19 without complications.   Echo in 10/20 showed EF stable at 40-45%. Echo in 12/21 showed EF 35-40%, moderate RV enlargement with normal systolic function, 4.2 cm ascending aorta.   He presents today for followup of CHF.  He is doing well symptomatically.  SBP runs around 100, no lightheadedness.  Weight down 5 lbs.  He has been swimming for 45 minutes daily for exercise.  No significant exertional dyspnea, no chest pain.      St Jude device interrogation: thoracic impedance stable.   ECG (personally reviewed): NSR, LAFB, QTc 454 msec, septal Qs  HIV negative, ferritin normal, immunofixation with  no monoclonal protein.  Labs (6/16): K 4.2, Creatinine 1.08, HCT 37.5 Labs (08/18/14): K 4.2, Creatinine 1.0 Labs (8/16): K 4.7, creatinine 1.18 Labs (9/16): Mg 2.1, creatinine 1.16, K 4.6 Labs (6/18): K 4.4, creatinine 1.25 Labs (9/18): K 4.4, creatinine 1.24 Labs (12/18): K 4.1, creatinine 1.2, hgb 11.6 Labs (10/19): K 4.3, creatinine 1.0 Labs (3/21): K 4.7, creatinine 1.52 Labs (9/21): K 4.6, creaetinine 1.57 Labs (12/21): K 4.4, creatinine 1.75 Labs (2/22): LDL 58  PMH 1. Nonischemic dilated cardiomyopathy: Echo (2006) with EF 15-20%.  LHC in 10/06 showed normal coronaries.  By 2014, EF had improved to 50%.  Echo in 1/16 showed EF 35-40%.  Echo (5/16) showed EF 25% with mild MR.  St Jude ICD 6/16.  HIV negative, immunofixation with no monoclonal protein, and ferritin normal.  Lexiscan Cardiolite (7/16) with EF 39% and partially reversible inferior and apical perfusion defect (intermediate risk).   LHC (7/16) with EF 35-40%, no significant CAD.  - Echo (8/17): EF 45-50% - Echo (8/18): EF 35-40%, moderate diastoilc dysfunction, mild AI, mild MR, mild to moderately decreased RV systolic function.   - Echo (8/19): EF 40-45%, mild-moderate MR, mild to moderately decreased RV systolic function.  - Echo (10/20): EF 40-45%, mild LVH, normal RV, mild MR.  - Echo (12/21): EF 35-40%, moderate RV enlargement with normal systolic function, 4.2 cm ascending aorta.  2. Atrial fibrillation: Paroxysmal.  First noted  in 2013.  DCCV in 7/15.  Back in atrial fibrillation 1/16.  Converted back to NSR with Tikosyn in 8/16.  3. HTN 4. Rheumatoid Arthritis: Has been off all medications for several years.   5. H/o bilateral THR 6. Gynecomastia with spironolactone.  7. PVCs: Holter in 8/19 showed 8% PVCs.  8. Ascending aortic aneurysm: 4.2 cm ascending aorta on 12/21 echo.  - CT chest (7/22) with 4.1 cm ascending aorta  SH: Lives at home with wife and 2 children and a grandchild. Never smoker, drinks 1 or 2  beers a week with dinner. 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   FH: Father with MI at 4, brother with atrial fibrillation, mother with renal failure.   Review of systems complete and found to be negative unless listed in HPI.    Current Outpatient Medications  Medication Sig Dispense Refill   atorvastatin (LIPITOR) 20 MG tablet Take 1 tablet (20 mg total) by mouth daily. 90 tablet 3   carvedilol (COREG) 25 MG tablet TAKE 1 TABLET BY MOUTH TWICE A DAY 180 tablet 3   dapagliflozin propanediol (FARXIGA) 10 MG TABS tablet Take 1 tablet (10 mg total) by mouth daily before breakfast. 90 tablet 3   dofetilide (TIKOSYN) 250 MCG capsule TAKE 1 CAPSULE (250 MCG TOTAL) BY MOUTH 2 TIMES DAILY. PLEASE CALL FOR OFFICE VISIT [phone] 60 capsule 11   ENTRESTO 97-103 MG TAKE 1 TABLET BY MOUTH TWICE A DAY 180 tablet 1   eplerenone (INSPRA) 50 MG tablet Take 1 tablet (50 mg total) by mouth daily. 90 tablet 0   furosemide (LASIX) 40 MG tablet TAKE 1 TABLET BY MOUTH EVERY DAY 90 tablet 3   multivitamin-iron-minerals-folic acid (CENTRUM) chewable tablet Chew 1 tablet by mouth daily.     XARELTO 20 MG TABS tablet TAKE 1 TABLET BY MOUTH DAILY WITH SUPPER. 90 tablet 1   No current facility-administered medications for this encounter.   Allergies  Allergen Reactions   Other Other (See Comments)   Spironolactone     Gynecomastia   Sulfa Antibiotics     Thrush and hives   Vitals:   09/09/20 0959  BP: (!) 90/58  Pulse: 61  SpO2: 99%  Weight: 92.6 kg (204 lb 3.2 oz)    Wt Readings from Last 3 Encounters:  09/09/20 92.6 kg (204 lb 3.2 oz)  04/14/20 95.7 kg (211 lb)  03/26/20 95.3 kg (210 lb 3.2 oz)    PHYSICAL EXAM: General: NAD Neck: No JVD, no thyromegaly or thyroid nodule.  Lungs: Clear to auscultation bilaterally with normal respiratory effort. CV: Nondisplaced PMI.  Heart regular S1/S2, no S3/S4, no murmur.  No peripheral edema.  No carotid bruit.  Normal pedal  pulses.  Abdomen: Soft, nontender, no hepatosplenomegaly, no distention.  Skin: Intact without lesions or rashes.  Neurologic: Alert and oriented x 3.  Psych: Normal affect. Extremities: No clubbing or cyanosis.  HEENT: Normal.   ASSESSMENT & PLAN: 1. Chronic systolic HF: Nonischemic cardiomyopathy x years.  He has a Secondary school teacher ICD and is not a candidate for CRT due to narrow QRS.  7/16 LHC showed no significant CAD.  Most recent echo in 12/21 with EF 35-40%, fairly stable.  NYHA class I-II symptoms, not volume overloaded by exam or Corevue.  - Continue dapagliflozin 10 mg daily.    - Continue Coreg 25 mg BID - Continue Entresto 97/103 bid.   - Gynecomastia resolved off spironolactone.  Continue eplerenone 50 mg daily. BMET  today.  - Echo at followup in 6 months.  2.  Atrial fibrillation: Paroxysmal.  Holding NSR on Tikosyn.  QTc ok on ECG.  - BMET and Mg today.  - Continue Xarelto 20 mg daily. CBC today.  3. PVCs: Holter in 8/19 showed 8% PVCs.  This is unlikely to affect his cardiomyopathy.   4. CKD: Stage 3.   - Continue dapagliflozin.  5. Dilated ascending aorta: 4.1 cm on CT chest in 7/22.   Followup in 6 months with echo.   Marca Ancona, MD  09/09/2020

## 2020-09-09 NOTE — Patient Instructions (Addendum)
It was great to see you today! No medication changes are needed at this time.   Labs today We will only contact you if something comes back abnormal or we need to make some changes. Otherwise no news is good news!  Your physician recommends that you schedule a follow-up appointment in: 4-5 months with Dr [name] and echo  Your physician has requested that you have an echocardiogram. Echocardiography is a painless test that uses sound waves to create images of your heart. It provides your doctor with information about the size and shape of your heart and how well your heart's chambers and valves are working. This procedure takes approximately one hour. There are no restrictions for this procedure.   Do the following things EVERYDAY: Weigh yourself in the morning before breakfast. Write it down and keep it in a log. Take your medicines as prescribed Eat low salt foods--Limit salt (sodium) to 2000 mg per day.  Stay as active as you can everyday Limit all fluids for the day to less than 2 liters  milAt the [facility], you and your health needs are our priority. As part of our continuing mission to provide you with exceptional heart care, we have created designated Provider Care Teams. These Care Teams include your primary Cardiologist (physician) and Advanced Practice Providers (APPs- Physician Assistants and Nurse Practitioners) who all work together to provide you with the care you need, when you need it.   You may see any of the following providers on your designated Care Team at your next follow up: Dr Arvilla Meres Dr Marca Ancona Dr Brandon Melnick, NP Robbie Lis, [place] Mikki Santee Karle Plumber, PharmD   Please be sure to bring in all your medications bottles to every appointment.    If you have any questions or concerns before your next appointment please send [place] a message through [place] or call our office at [phone].    TO LEAVE  A MESSAGE FOR THE NURSE SELECT OPTION 2, PLEASE LEAVE A MESSAGE INCLUDING: YOUR NAME DATE OF BIRTH CALL BACK NUMBER REASON FOR CALL**this is important as we prioritize the call backs  YOU WILL RECEIVE A CALL BACK THE SAME DAY AS LONG AS YOU CALL BEFORE 4:00 PM

## 2020-09-13 ENCOUNTER — Other Ambulatory Visit (HOSPITAL_COMMUNITY): Payer: Self-pay | Admitting: Cardiology

## 2020-09-21 ENCOUNTER — Ambulatory Visit: Payer: Medicare Other | Admitting: Internal Medicine

## 2020-09-21 ENCOUNTER — Encounter: Payer: Self-pay | Admitting: Internal Medicine

## 2020-09-21 ENCOUNTER — Telehealth: Payer: Self-pay | Admitting: Internal Medicine

## 2020-09-21 ENCOUNTER — Other Ambulatory Visit: Payer: Self-pay

## 2020-09-21 VITALS — BP 112/64 | HR 58 | Temp 97.7°F | Ht 71.0 in | Wt 205.8 lb

## 2020-09-21 DIAGNOSIS — R918 Other nonspecific abnormal finding of lung field: Secondary | ICD-10-CM

## 2020-09-21 DIAGNOSIS — Z8739 Personal history of other diseases of the musculoskeletal system and connective tissue: Secondary | ICD-10-CM

## 2020-09-21 DIAGNOSIS — Z8616 Personal history of COVID-19: Secondary | ICD-10-CM | POA: Diagnosis not present

## 2020-09-21 NOTE — H&P (View-Only) (Signed)
OV 03/26/2020  Subjective:  Patient ID: Terry [name], male , DOB: 1955/08/18 , age 65 y.o. , MRN: [id number] , ADDRESS: [place] [address] PCP Shirline Frees, MD Patient Care Team: Shirline Frees, MD as PCP - General (Family Medicine) Sueanne Margarita, MD as PCP - Cardiology (Cardiology)  This Provider for this visit: Treatment Team:  Attending Provider: Brand Males, MD    03/26/2020 -   Chief Complaint  Patient presents with   Consult     HPI Terry [name] 65 y.o. -non-smoker but with a long history of rheumatoid arthritis.  He is not on any immunomodulators for over 15 years.  He feels his quality of life is better with the immunomodulators.  He keeps himself physically active.  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  He had a CT scan of the chest for this that showed nonspecific nodules.  I personally visualized this and agree with this.  Some of these are scattered groundglass opacities some of them are tree-in-bud.  Particularly in the right lower lobe and also in the esophageal recess.  He denies any postnasal drip or acid reflux.  He denies any shortness of breath.  Of note he has cough that is chronic for the last 1 year.  He has been on Entresto for a few years.  A year ago Dr. Loralie Champagne his cardiologist increase the [facility] and therefore his cough got worse.  His cough is rated at a scale of 3 out of 10 both early in the morning and late at night.  Early morning is particularly more.  Nevertheless overall the burden is mild.  There is no sputum production.  There is no shortness of breath or wheezing.    CT Chest data -January 28, 2020 personally visualized.  And agree with the findings.   IMPRESSION: 1. Ascending thoracic aorta approximately 4.2 cm maximal caliber. Recommend annual imaging followup by CTA or MRA. This recommendation follows 2010 ACCF/AHA/AATS/ACR/ASA/SCA/SCAI/SIR/STS/SVM Guidelines for the Diagnosis and  Management of Patients with Thoracic Aortic Disease. Circulation. 2010; 121: V400-Q676. Aortic aneurysm NOS (ICD10-I71.9) 2. Scattered areas of tree-in-bud nodularity at the bases. More focal collection of nodules and ground-glass in the as ago esophageal recess. Findings are likely related to chronic infection. Largest nodule approximately 5 mm. Some of these areas show ground-glass features. Non-contrast chest CT at 3-6 months is recommended. If nodules persist and are stable at that time, consider additional non-contrast chest CT examinations at 2 and 4 years. This recommendation follows the consensus statement: Guidelines for Management of Incidental Pulmonary Nodules Detected on CT Images: From the Fleischner Society 2017; Radiology 2017; 284:228-243. 3. Cholelithiasis without evidence of acute cholecystitis. 4. Aortic atherosclerosis.   Aortic Atherosclerosis (ICD10-I70.0).     Electronically Signed   By: Zetta Bills M.D.  OV 09/21/2020  Subjective:  Patient ID: Terry [name], male , DOB: [birth year] , age 14 y.o. , MRN: [id number] , ADDRESS: [place] [address] PCP Shirline Frees, MD Patient Care Team: Shirline Frees, MD as PCP - General (Family Medicine) Sueanne Margarita, MD as PCP - Cardiology (Cardiology)  This Provider for this visit: Treatment Team:  Attending Provider: Brand Males, MD    09/21/2020 -   Chief Complaint  Patient presents with   Follow-up    Pt states he has been doing okay since last visit and denies any concerns.   Follow-up mild chronic cough in the setting of Entresto  Follow-up history of rheumatoid arthritis not on immunomodulators Follow-up nonspecific CT scan infiltrates with multiple lung nodules -no shortness of breath  HPI [name] 65 y.o. -returns for follow-up.  Since I last saw him in May 2022 he went to [place] and [place].  While in [place] mid May 2022 he developed mild COVID infection.  He recovered  from it.  He never had much symptoms.  He feels fine right now.  He just has mild cough because of Entresto.  No shortness of breath.  We went over the CT scan results showing waxing and waning tree-in-bud opacities particular in the lower lobe but overall increased.  Also slight increase in nodularity particularly 7 mm left lower lobe.  He is quite worried about this.  His wife indicated that he worries and is anxious quite a bit.  Explained to him in the setting of recent COVID this could be COVID-related accentuation of abnormalities and we could opt to follow-up in several months to see if things settle down.  He reflected on this.  After this we also discussed about the idea of having a bronchoscopy with lavage right now to rule out any MAI.  We went over the differential diagnosis of MAI, interstitial reticular abnormalities with potential to transform into ILD and rheumatoid nodules.  Based on this he wants to proceed with a bronchoscopy with lavage at this point to rule out any infection.  He is preparing to go to [place] in the end of September 2022.  Therefore he wants all his bases covered.   Risks of pneumothorax, hemothorax, sedation/anesthesia complications such as cardiac or respiratory arrest or hypotension, stroke and bleeding all explained. Benefits of diagnosis but limitations of non-diagnosis also explained. Patient verbalized understanding and wished to proceed.      CT Chest data July 2022    IMPRESSION: 1. Moderate patchy tree-in-bud opacities and scattered centrilobular nodules in the peripheral lungs bilaterally, most prominent at the lung bases, overall increased, although with some waxing and waning behavior. Minimal cylindrical bronchiolectasis at the lung bases. Findings are most compatible with a nonspecific chronic or recurrent infectious or inflammatory bronchiolitis, with the differential including atypical mycobacterial infection (MAI) or  recurrent aspiration. 2. The largest new pulmonary nodule measures 7 mm in the posterior left lower lobe, favor inflammatory. Suggest attention on follow-up chest CT in 6-12 months. 3. 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 Recommend annual imaging followup by CTA or MRA. This recommendation follows 2010 ACCF/AHA/AATS/ACR/ASA/SCA/SCAI/SIR/STS/SVM Guidelines for the Diagnosis and Management of Patients with Thoracic Aortic Disease. Circulation. 2010; 121: O378-H885. Aortic aneurysm NOS (ICD10-I71.9). 4. Three-vessel coronary atherosclerosis. 5. Cholelithiasis. 6. Aortic Atherosclerosis (ICD10-I70.0).     Electronically Signed   By: Delbert Phenix M.D.   On: 08/17/2020 11:40   has a past medical history of AICD (automatic cardioverter/defibrillator) present, Anemia, Aortic atherosclerosis (HCC), Aortic insufficiency, Ascending aorta dilation (HCC), Chronic systolic CHF (congestive heart failure), NYHA class 1 (HCC), Complication of anesthesia, GERD (gastroesophageal reflux disease), H/O hematuria, H/O hiatal hernia, Hepatitis (1960's), History of blood transfusion (2013; 2014), Hypertension, Nonischemic dilated cardiomyopathy (HCC), PAF (paroxysmal atrial fibrillation) (HCC), PVC (premature ventricular contraction), and Rheumatoid arthritis (HCC) (dx'd 1995).   reports that he has never smoked. He has never used smokeless tobacco.  Past Surgical History:  Procedure Laterality Date   CARDIAC CATHETERIZATION  ~ 2004   normal   CARDIAC CATHETERIZATION N/A 09/07/2014   Procedure: Left Heart Cath and Coronary Angiography;  Surgeon: Laurey Morale,  MD;  Location: [facility] INVASIVE CV LAB;  Service: Cardiovascular;  Laterality: N/A;   CARDIAC DEFIBRILLATOR PLACEMENT  08/05/2014   [employer] 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    JOINT REPLACEMENT     NASAL SEPTUM SURGERY  ~ 1975   TONSILLECTOMY     TOTAL HIP ARTHROPLASTY  09/08/2011   Procedure: TOTAL HIP ARTHROPLASTY ANTERIOR APPROACH;  Surgeon: [name], MD;  Location: [facility] ORS;  Service: Orthopedics;  Laterality: Right;  Right total hip replacement, left knee steroid injection   TOTAL KNEE ARTHROPLASTY Left 11/29/2012   Procedure: LEFT TOTAL KNEE ARTHROPLASTY;  Surgeon: [name], MD;  Location: [facility] ORS;  Service: Orthopedics;  Laterality: Left;  FEMORAL NERVE BLOCK IN HOLDING AREA LEFT LEG   TOTAL KNEE ARTHROPLASTY Right 11/30/2017   Procedure: RIGHT TOTAL KNEE ARTHROPLASTY;  Surgeon: [name], MD;  Location: [facility] ORS;  Service: Orthopedics;  Laterality: Right;   URETHRAL FISTULA REPAIR  ~ 1996   URETHRAL STRICTURE DILATATION  "several times"    Allergies  Allergen Reactions   Other Other (See Comments)   Spironolactone     Gynecomastia   Sulfa Antibiotics     Thrush and hives    Immunization History  Administered Date(s) Administered   Influenza Split 11/30/2008, 10/26/2011, 10/18/2013, 08/06/2014, 10/11/2014, 10/22/2015, 09/30/2016, 09/28/2018, 09/28/2019   Influenza,inj,Quad PF,6+ Mos 09/30/2016, 10/06/2017, 09/28/2018   PFIZER Comirnaty(Gray Top)Covid-19 Tri-Sucrose Vaccine 05/12/2020   PFIZER(Purple Top)SARS-COV-2 Vaccination 04/30/2019, 05/21/2019, 11/23/2019   Pneumococcal Polysaccharide-23 08/06/2014   Td 12/20/2016   Tdap 11/26/2006   Zoster Recombinat (Shingrix) 10/26/2018, 12/28/2018   Zoster, Live 12/14/2015, 10/26/2018, 12/28/2018    Family History  Problem Relation Age of Onset   Heart disease Father    Heart attack Father 48   Heart failure Brother       Current Outpatient Medications:    atorvastatin (LIPITOR) 20 MG tablet, Take 1 tablet (20 mg total) by mouth daily., Disp: 90 tablet, Rfl: 3   carvedilol (COREG) 25 MG tablet, TAKE 1 TABLET BY MOUTH TWICE A DAY, Disp: 180 tablet, Rfl: 3   dapagliflozin propanediol (FARXIGA) 10 MG TABS tablet, Take 1 tablet (10 mg total) by mouth daily before breakfast., Disp: 90 tablet, Rfl: 3   dofetilide (TIKOSYN) 250 MCG capsule, TAKE 1 CAPSULE (250 MCG TOTAL) BY MOUTH 2 TIMES DAILY. PLEASE CALL FOR OFFICE VISIT [phone], Disp: 60 capsule, Rfl: 11   ENTRESTO 97-103 MG, TAKE 1 TABLET BY MOUTH TWICE A DAY, Disp: 180 tablet, Rfl: 1   eplerenone (INSPRA) 50 MG tablet, TAKE 1 TABLET BY MOUTH EVERY DAY, Disp: 90 tablet, Rfl: 1   furosemide (LASIX) 40 MG tablet, TAKE 1 TABLET BY MOUTH EVERY DAY, Disp: 90 tablet, Rfl: 3   multivitamin-iron-minerals-folic acid (CENTRUM) chewable tablet, Chew 1 tablet by mouth daily., Disp: , Rfl:    XARELTO 20 MG TABS tablet, TAKE 1 TABLET BY MOUTH DAILY WITH SUPPER., Disp: 90 tablet, Rfl: 1      Objective:   Vitals:   09/21/20 0851  BP: 112/64  Pulse: (!) 58  Temp: 97.7 F (36.5 C)  TempSrc: Oral  SpO2: 96%  Weight: 205 lb 12.8 oz (93.4 kg)  Height: 5\' 11"  (1.803 m)    Estimated body mass index is 28.7 kg/m as calculated from the following:   Height as of this encounter: 5\' 11"  (1.803 m).   Weight as of this encounter: 205 lb 12.8 oz (93.4 kg).  @WEIGHTCHANGE @    09/21/20 0851  Weight: 205 lb 12.8 oz (93.4 kg)     Physical Exam  General: No distress. Looks well Neuro: Alert and Oriented x 3. GCS 15. Speech normal Psych: Pleasant Resp:  Barrel Chest - no.  Wheeze - no, Crackles - no, No overt respiratory distress CVS: Normal heart sounds. Murmurs - no Ext: Stigmata of Connective Tissue Disease - no HEENT: Normal upper airway. PEERL +. No post nasal drip        Assessment:       ICD-10-CM   1. Pulmonary infiltrate present on  computed tomography  R91.8 CT Super D Chest Wo Contrast    2. History of rheumatoid arthritis  Z87.39     3. Multiple lung nodules on CT  R91.8 CT Super D Chest Wo Contrast    4. Personal history of COVID-19  Z86.16          Plan:     Patient Instructions     ICD-10-CM   1. Pulmonary infiltrate present on computed tomography  R91.8     2. History of rheumatoid arthritis  Z87.39     3. Multiple lung nodules on CT  R91.8     4. Personal history of COVID-19  Z86.16       Schedule bronchoscopy with lavage 08/24/20, or 7/22-7/25/22 at [facility] . No biopsy  Schedule followup tele visit for results with Dr 11/21/20 or APP in mid-end sept 2022  Schedule followup super CT Chest without contrast end of Oct 2022  [place] followup with Dr Marchelle Gearing after CT chest end of Oct or early nov 2022    SIGNATURE    Dr. Marchelle Gearing, M.D., F.C.C.P,  Pulmonary and Critical Care Medicine Staff Physician, [facility] Director - Interstitial Lung Disease  Program  Pulmonary Fibrosis [facility] at [facility], [facility], [facility]  Pager: [phone], If no answer or between  15:00h - 7:00h: call [phone] Telephone: [phone]  9:29 AM 09/21/2020

## 2020-09-21 NOTE — Telephone Encounter (Signed)
Called and advised patient that he will receive a call sometime in September to schedule his CT scan in October. Patient verbalized understanding.  Nothing further needed at this time.

## 2020-09-21 NOTE — Progress Notes (Signed)
OV 03/26/2020  Subjective:  Patient ID: Terry [name], male , DOB: 1955/08/18 , age 65 y.o. , MRN: [id number] , ADDRESS: [place] [address] PCP Shirline Frees, MD Patient Care Team: Shirline Frees, MD as PCP - General (Family Medicine) Sueanne Margarita, MD as PCP - Cardiology (Cardiology)  This Provider for this visit: Treatment Team:  Attending Provider: Brand Males, MD    03/26/2020 -   Chief Complaint  Patient presents with   Consult     HPI Terry [name] 65 y.o. -non-smoker but with a long history of rheumatoid arthritis.  He is not on any immunomodulators for over 15 years.  He feels his quality of life is better with the immunomodulators.  He keeps himself physically active.  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  He had a CT scan of the chest for this that showed nonspecific nodules.  I personally visualized this and agree with this.  Some of these are scattered groundglass opacities some of them are tree-in-bud.  Particularly in the right lower lobe and also in the esophageal recess.  He denies any postnasal drip or acid reflux.  He denies any shortness of breath.  Of note he has cough that is chronic for the last 1 year.  He has been on Entresto for a few years.  A year ago Dr. Loralie Champagne his cardiologist increase the [facility] and therefore his cough got worse.  His cough is rated at a scale of 3 out of 10 both early in the morning and late at night.  Early morning is particularly more.  Nevertheless overall the burden is mild.  There is no sputum production.  There is no shortness of breath or wheezing.    CT Chest data -January 28, 2020 personally visualized.  And agree with the findings.   IMPRESSION: 1. Ascending thoracic aorta approximately 4.2 cm maximal caliber. Recommend annual imaging followup by CTA or MRA. This recommendation follows 2010 ACCF/AHA/AATS/ACR/ASA/SCA/SCAI/SIR/STS/SVM Guidelines for the Diagnosis and  Management of Patients with Thoracic Aortic Disease. Circulation. 2010; 121: V400-Q676. Aortic aneurysm NOS (ICD10-I71.9) 2. Scattered areas of tree-in-bud nodularity at the bases. More focal collection of nodules and ground-glass in the as ago esophageal recess. Findings are likely related to chronic infection. Largest nodule approximately 5 mm. Some of these areas show ground-glass features. Non-contrast chest CT at 3-6 months is recommended. If nodules persist and are stable at that time, consider additional non-contrast chest CT examinations at 2 and 4 years. This recommendation follows the consensus statement: Guidelines for Management of Incidental Pulmonary Nodules Detected on CT Images: From the Fleischner Society 2017; Radiology 2017; 284:228-243. 3. Cholelithiasis without evidence of acute cholecystitis. 4. Aortic atherosclerosis.   Aortic Atherosclerosis (ICD10-I70.0).     Electronically Signed   By: Zetta Bills M.D.  OV 09/21/2020  Subjective:  Patient ID: Terry [name], male , DOB: [birth year] , age 65 y.o. , MRN: [id number] , ADDRESS: [place] [address] PCP Shirline Frees, MD Patient Care Team: Shirline Frees, MD as PCP - General (Family Medicine) Sueanne Margarita, MD as PCP - Cardiology (Cardiology)  This Provider for this visit: Treatment Team:  Attending Provider: Brand Males, MD    09/21/2020 -   Chief Complaint  Patient presents with   Follow-up    Pt states he has been doing okay since last visit and denies any concerns.   Follow-up mild chronic cough in the setting of Entresto  Follow-up history of rheumatoid arthritis not on immunomodulators Follow-up nonspecific CT scan infiltrates with multiple lung nodules -no shortness of breath  HPI Terry [name] 65 y.o. -returns for follow-up.  Since I last saw him in May 2022 he went to [place] and [place].  While in [place] mid May 2022 he developed mild COVID infection.  He recovered  from it.  He never had much symptoms.  He feels fine right now.  He just has mild cough because of Entresto.  No shortness of breath.  We went over the CT scan results showing waxing and waning tree-in-bud opacities particular in the lower lobe but overall increased.  Also slight increase in nodularity particularly 7 mm left lower lobe.  He is quite worried about this.  His wife indicated that he worries and is anxious quite a bit.  Explained to him in the setting of recent COVID this could be COVID-related accentuation of abnormalities and we could opt to follow-up in several months to see if things settle down.  He reflected on this.  After this we also discussed about the idea of having a bronchoscopy with lavage right now to rule out any MAI.  We went over the differential diagnosis of MAI, interstitial reticular abnormalities with potential to transform into ILD and rheumatoid nodules.  Based on this he wants to proceed with a bronchoscopy with lavage at this point to rule out any infection.  He is preparing to go to [place] in the end of September 2022.  Therefore he wants all his bases covered.   Risks of pneumothorax, hemothorax, sedation/anesthesia complications such as cardiac or respiratory arrest or hypotension, stroke and bleeding all explained. Benefits of diagnosis but limitations of non-diagnosis also explained. Patient verbalized understanding and wished to proceed.      CT Chest data July 2022    IMPRESSION: 1. Moderate patchy tree-in-bud opacities and scattered centrilobular nodules in the peripheral lungs bilaterally, most prominent at the lung bases, overall increased, although with some waxing and waning behavior. Minimal cylindrical bronchiolectasis at the lung bases. Findings are most compatible with a nonspecific chronic or recurrent infectious or inflammatory bronchiolitis, with the differential including atypical mycobacterial infection (MAI) or  recurrent aspiration. 2. The largest new pulmonary nodule measures 7 mm in the posterior left lower lobe, favor inflammatory. Suggest attention on follow-up chest CT in 6-12 months. 3. 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 Recommend annual imaging followup by CTA or MRA. This recommendation follows 2010 ACCF/AHA/AATS/ACR/ASA/SCA/SCAI/SIR/STS/SVM Guidelines for the Diagnosis and Management of Patients with Thoracic Aortic Disease. Circulation. 2010; 121: O378-H885. Aortic aneurysm NOS (ICD10-I71.9). 4. Three-vessel coronary atherosclerosis. 5. Cholelithiasis. 6. Aortic Atherosclerosis (ICD10-I70.0).     Electronically Signed   By: Delbert Phenix M.D.   On: 08/17/2020 11:40   has a past medical history of AICD (automatic cardioverter/defibrillator) present, Anemia, Aortic atherosclerosis (HCC), Aortic insufficiency, Ascending aorta dilation (HCC), Chronic systolic CHF (congestive heart failure), NYHA class 1 (HCC), Complication of anesthesia, GERD (gastroesophageal reflux disease), H/O hematuria, H/O hiatal hernia, Hepatitis (1960's), History of blood transfusion (2013; 2014), Hypertension, Nonischemic dilated cardiomyopathy (HCC), PAF (paroxysmal atrial fibrillation) (HCC), PVC (premature ventricular contraction), and Rheumatoid arthritis (HCC) (dx'd 1995).   reports that he has never smoked. He has never used smokeless tobacco.  Past Surgical History:  Procedure Laterality Date   CARDIAC CATHETERIZATION  ~ 2004   normal   CARDIAC CATHETERIZATION N/A 09/07/2014   Procedure: Left Heart Cath and Coronary Angiography;  Surgeon: Laurey Morale,  MD;  [facility];  Service: Cardiovascular;  Laterality: N/A;   CARDIAC DEFIBRILLATOR PLACEMENT  08/05/2014   St. 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    JOINT REPLACEMENT     NASAL SEPTUM SURGERY  ~ 1975   TONSILLECTOMY     TOTAL HIP ARTHROPLASTY  09/08/2011   Procedure: TOTAL HIP ARTHROPLASTY ANTERIOR APPROACH;  Surgeon: Christopher Y Blackman, MD;  Location: [facility] ORS;  Service: Orthopedics;  Laterality: Right;  Right total hip replacement, left knee steroid injection   TOTAL KNEE ARTHROPLASTY Left 11/29/2012   Procedure: LEFT TOTAL KNEE ARTHROPLASTY;  Surgeon: Christopher Y Blackman, MD;  Location: [facility];  Service: Orthopedics;  Laterality: Left;  FEMORAL NERVE BLOCK IN HOLDING AREA LEFT LEG   TOTAL KNEE ARTHROPLASTY Right 11/30/2017   Procedure: RIGHT TOTAL KNEE ARTHROPLASTY;  Surgeon: Blackman, Christopher Y, MD;  Location: [facility];  Service: Orthopedics;  Laterality: Right;   URETHRAL FISTULA REPAIR  ~ 1996   URETHRAL STRICTURE DILATATION  "several times"    Allergies  Allergen Reactions   Other Other (See Comments)   Spironolactone     Gynecomastia   Sulfa Antibiotics     Thrush and hives    Immunization History  Administered Date(s) Administered   Influenza Split 11/30/2008, 10/26/2011, 10/18/2013, 08/06/2014, 10/11/2014, 10/22/2015, 09/30/2016, 09/28/2018, 09/28/2019   Influenza,inj,Quad PF,6+ Mos 09/30/2016, 10/06/2017, 09/28/2018   PFIZER Comirnaty(Gray Top)Covid-19 Tri-Sucrose Vaccine 05/12/2020   PFIZER(Purple Top)SARS-COV-2 Vaccination 04/30/2019, 05/21/2019, 11/23/2019   Pneumococcal Polysaccharide-23 08/06/2014   Td 12/20/2016   Tdap 11/26/2006   Zoster Recombinat (Shingrix) 10/26/2018, 12/28/2018   Zoster, Live 12/14/2015, 10/26/2018, 12/28/2018    Family History  Problem Relation Age of Onset   Heart disease Father    Heart attack Father 51   Heart failure Brother       Current Outpatient Medications:    atorvastatin (LIPITOR) 20 MG tablet, Take 1 tablet (20 mg total) by mouth daily., Disp: 90 tablet, Rfl: 3   carvedilol (COREG) 25 MG tablet, TAKE 1 TABLET BY MOUTH TWICE A DAY, Disp: 180 tablet, Rfl: 3   dapagliflozin propanediol (FARXIGA) 10 MG TABS tablet, Take 1 tablet (10 mg total) by mouth daily before breakfast., Disp: 90 tablet, Rfl: 3   dofetilide (TIKOSYN) 250 MCG capsule, TAKE 1 CAPSULE (250 MCG TOTAL) BY MOUTH 2 TIMES DAILY. PLEASE CALL FOR OFFICE VISIT [phone], Disp: 60 capsule, Rfl: 11   ENTRESTO 97-103 MG, TAKE 1 TABLET BY MOUTH TWICE A DAY, Disp: 180 tablet, Rfl: 1   eplerenone (INSPRA) 50 MG tablet, TAKE 1 TABLET BY MOUTH EVERY DAY, Disp: 90 tablet, Rfl: 1   furosemide (LASIX) 40 MG tablet, TAKE 1 TABLET BY MOUTH EVERY DAY, Disp: 90 tablet, Rfl: 3   multivitamin-iron-minerals-folic acid (CENTRUM) chewable tablet, Chew 1 tablet by mouth daily., Disp: , Rfl:    XARELTO 20 MG TABS tablet, TAKE 1 TABLET BY MOUTH DAILY WITH SUPPER., Disp: 90 tablet, Rfl: 1      Objective:   Vitals:   09/21/20 0851  BP: 112/64  Pulse: (!) 58    Temp: 97.7 F (36.5 C)  TempSrc: Oral  SpO2: 96%  Weight: 205 lb 12.8 oz (93.4 kg)  Height: 5\' 11"  (1.803 m)    Estimated body mass index is 28.7 kg/m as calculated from the following:   Height as of this encounter: 5\' 11"  (1.803 m).   Weight as of this encounter: 205 lb 12.8 oz (93.4 kg).  @WEIGHTCHANGE @    09/21/20 0851  Weight: 205 lb 12.8 oz (93.4 kg)     Physical Exam  General: No distress. Looks well Neuro: Alert and Oriented x 3. GCS 15. Speech normal Psych: Pleasant Resp:  Barrel Chest - no.  Wheeze - no, Crackles - no, No overt respiratory distress CVS: Normal heart sounds. Murmurs - no Ext: Stigmata of Connective Tissue Disease - no HEENT: Normal upper airway. PEERL +. No post nasal drip        Assessment:       ICD-10-CM   1. Pulmonary infiltrate present on  computed tomography  R91.8 CT Super D Chest Wo Contrast    2. History of rheumatoid arthritis  Z87.39     3. Multiple lung nodules on CT  R91.8 CT Super D Chest Wo Contrast    4. Personal history of COVID-19  Z86.16          Plan:     Patient Instructions     ICD-10-CM   1. Pulmonary infiltrate present on computed tomography  R91.8     2. History of rheumatoid arthritis  Z87.39     3. Multiple lung nodules on CT  R91.8     4. Personal history of COVID-19  Z86.16       Schedule bronchoscopy with lavage 08/24/20, or 7[facility]22[facility]7/25/[facility] . No biopsy  Schedule followup tele visit for results with Dr 11/21/20 or APP in mid-end sept 2022  Schedule followup super CT Chest without contrast end of Oct 2022  [place] followup with Dr Marchelle Gearing after CT chest end of Oct or early nov 2022    SIGNATURE    Dr. Marchelle Gearing, M.D., F.C.C.P,  Pulmonary and Critical Care Medicine Staff Physician, [facility] Director - Interstitial Lung Disease  Program  Pulmonary Fibrosis [facility] at [facility], [facility], [facility]  Pager: [phone], If no answer or between  15:00h - 7:00h: call [phone] Telephone: [phone]  9:29 AM 09/21/2020

## 2020-09-21 NOTE — Patient Instructions (Signed)
ICD-10-CM   1. Pulmonary infiltrate present on computed tomography  R91.8     2. History of rheumatoid arthritis  Z87.39     3. Multiple lung nodules on CT  R91.8     4. Personal history of COVID-19  Z86.16       Schedule bronchoscopy with lavage 08/24/20, or 7/22-7/25/22 at [facility] . No biopsy  Schedule followup tele visit for results with Dr Marchelle Gearing or APP in mid-end sept 2022  Schedule followup super CT Chest without contrast end of Oct 2022  [place] followup with Dr Marchelle Gearing after CT chest end of Oct or early nov 2022

## 2020-09-21 NOTE — Telephone Encounter (Signed)
Called and spoke with patient. Let them know their Bronch is scheduled for August 23 with Dr. Marchelle Gearing at [facility] at 0730.  Patient was instructed to arrive at hospital at [facility]. They were instructed to bring someone with them as they will not be able to drive home from procedure. Patient instructed not to have anything to eat or drink after midnight. Patient needs to hold their blood thinner Xarelto , 24 hours / days prior to procedure.   Patient's covid screening will be on 8/22 at [address] back of the building for drive up swab between 4LP-37TK.  Patient voiced understanding, nothing further needed  Routing to MR as FYI   And routing to Dr. Mayford Knife for clarification for Xarelto holding for 24 hours prior to procedure.

## 2020-09-21 NOTE — Telephone Encounter (Signed)
This has been noted on the order we will call in Sept for Oct ct's

## 2020-09-21 NOTE — Telephone Encounter (Signed)
PATIENT: Terry [name]: male MRN: [id number] DOB: 1955-12-03 ADDRESS: [place] [address]    Please schedule the following:  Diagnosis: Rheumatoid arthritis, history of COVID-19 in May 2022 and also multiple lung nodules with interstitial abnormalities tree-in-bud Procedure: Video bronchocoopy, flexible bronchoscopy with BAL Envisia Classifer Transbronchial biopsy: NO Anesthesia: moderate sedation Do you need Fluro? no Size of Scope: regular Pre-med nebulized lidocaine: yes Priority: see below Date: 08/24/20 or 7/22 - 7/25 Morning preferred Alternate Date: see above  Time: AMpreferred/ PMless preferred Location: [facility] Does patient have OSA? no DM? no Or Latex allergy? no Medication Restriction: hold xarelto for 1 prior if ok with his cardiology Anticoagulate/Antiplatelet: none Pre-op Labs Ordered: none Imaging request: none       MISCELLANEOUS KEY INSTRUCTIONS    Please coordinate Pre-op COVID Testing   Please let Dr Chase Caller know via reply phone message on Epic  Thank you     Key patient medical info     Allergy History:  Allergies  Allergen Reactions   Other Other (See Comments)   Spironolactone     Gynecomastia   Sulfa Antibiotics     Thrush and hives     Current Outpatient Medications:    atorvastatin (LIPITOR) 20 MG tablet, Take 1 tablet (20 mg total) by mouth daily., Disp: 90 tablet, Rfl: 3   carvedilol (COREG) 25 MG tablet, TAKE 1 TABLET BY MOUTH TWICE A DAY, Disp: 180 tablet, Rfl: 3   dapagliflozin propanediol (FARXIGA) 10 MG TABS tablet, Take 1 tablet (10 mg total) by mouth daily before breakfast., Disp: 90 tablet, Rfl: 3   dofetilide (TIKOSYN) 250 MCG capsule, TAKE 1 CAPSULE (250 MCG TOTAL) BY MOUTH 2 TIMES DAILY. PLEASE CALL FOR OFFICE VISIT [phone], Disp: 60 capsule, Rfl: 11   ENTRESTO 97-103 MG, TAKE 1 TABLET BY MOUTH TWICE A DAY, Disp: 180 tablet, Rfl: 1   eplerenone (INSPRA) 50 MG tablet, TAKE 1 TABLET BY  MOUTH EVERY DAY, Disp: 90 tablet, Rfl: 1   furosemide (LASIX) 40 MG tablet, TAKE 1 TABLET BY MOUTH EVERY DAY, Disp: 90 tablet, Rfl: 3   multivitamin-iron-minerals-folic acid (CENTRUM) chewable tablet, Chew 1 tablet by mouth daily., Disp: , Rfl:    XARELTO 20 MG TABS tablet, TAKE 1 TABLET BY MOUTH DAILY WITH SUPPER., Disp: 90 tablet, Rfl: 1   has a past medical history of AICD (automatic cardioverter/defibrillator) present, Anemia, Aortic atherosclerosis ([place]), Aortic insufficiency, Ascending aorta dilation (HCC), Chronic systolic CHF (congestive heart failure), NYHA class 1 ([place]), Complication of anesthesia, GERD (gastroesophageal reflux disease), H/O hematuria, H/O hiatal hernia, Hepatitis (1960's), History of blood transfusion (2013; 2014), Hypertension, Nonischemic dilated cardiomyopathy ([place]), PAF (paroxysmal atrial fibrillation) ([place]), PVC (premature ventricular contraction), and Rheumatoid arthritis ([place]) (dx'd 1995).    has a past surgical history that includes Nasal septum surgery (~ 1975); Urethra dilation ("several times"); Foot neuroma surgery (Right); Colonoscopy (2010; 2015); Total hip arthroplasty (09/08/2011); Total knee arthroplasty (Left, 11/29/2012); Cardiac catheterization (~ 2004); Cardioversion (N/A, 08/21/2013); Cardiac defibrillator placement (08/05/2014); Joint replacement; Tonsillectomy; Urethral fistula repair (~ 1996); Cardiac catheterization (N/A, 08/05/2014); Cardiac catheterization (N/A, 09/07/2014); and Total knee arthroplasty (Right, 11/30/2017).   SIGNATURE    Dr. Brand Males, M.D., F.C.C.P,  Pulmonary and Critical Care Medicine Staff Physician, [place] Director - Interstitial Lung Disease  Program  Pulmonary [place] at [place], [place], [address]  Pager: [phone], If no answer or between  15:00h - 7:00h: call [phone]  [place]: [phone]  9:30 AM 09/21/2020

## 2020-09-28 ENCOUNTER — Telehealth: Payer: Self-pay | Admitting: Internal Medicine

## 2020-09-28 NOTE — Telephone Encounter (Signed)
Terry [name] from [facility]. Pt wants his elloquis to be held but they need this to be cleared through the surgeon first. Please sned over a clearance request.

## 2020-09-28 NOTE — Telephone Encounter (Signed)
His xarelto is for PAF. My advise hold it for 24h prior to procdure. Procedure is only on 10/05/20. So we have time on this. I am personaly sending message to Dr Mayford Knife now and will get back to him as soon as she replies

## 2020-09-28 NOTE — Telephone Encounter (Signed)
Hello Dr. Marchelle Gearing, please respond on mychart message below, thanks!  Dr. Marchelle Gearing , I did and they are waiting for you to contact her. Please do so. Original rep  from your office told me to hold the Xarelto 24 hrs and I will do this if you don't get back to me. The nurse for Dr Mayford Knife that got back to me and told me to have you contact them was [place] . Thanks, Terry [name]

## 2020-09-28 NOTE — Telephone Encounter (Signed)
Dr. Marchelle Gearing, please advise on mychart message below, thanks!  Any, Carly at Dr Norris Cross office said they sent a request over and waiting. I still will hold up the Xarelto for 24 hrs prior to procedure as told by your scheduler originally if I don't hear back as I don't want any chance of bleeding.  Charlean Merl

## 2020-09-28 NOTE — Telephone Encounter (Signed)
Traci   I have him for bronch on 10/05/20. He is on Xarelto for PAF. Ok to stop it 24h prior to procedure? Or do you advise longer? I am planning mainly to lavage but biopsy if there si a lesion  THanks  MR

## 2020-09-29 ENCOUNTER — Encounter (HOSPITAL_COMMUNITY): Payer: Self-pay

## 2020-09-30 ENCOUNTER — Other Ambulatory Visit: Payer: Self-pay

## 2020-09-30 ENCOUNTER — Encounter (HOSPITAL_COMMUNITY): Payer: Self-pay | Admitting: Internal Medicine

## 2020-10-04 ENCOUNTER — Other Ambulatory Visit: Payer: Self-pay | Admitting: Internal Medicine

## 2020-10-04 LAB — SARS CORONAVIRUS 2 (TAT 6-24 HRS): SARS Coronavirus 2: NEGATIVE

## 2020-10-04 NOTE — Progress Notes (Signed)
Remote ICD transmission.   

## 2020-10-05 ENCOUNTER — Ambulatory Visit (HOSPITAL_COMMUNITY)
Admission: RE | Admit: 2020-10-05 | Discharge: 2020-10-05 | Disposition: A | Discharge status: Home / Self Care | Payer: Medicare Other | Source: Ambulatory Visit | Attending: Internal Medicine | Admitting: Internal Medicine

## 2020-10-05 ENCOUNTER — Encounter (HOSPITAL_COMMUNITY): Payer: Self-pay | Admitting: Internal Medicine

## 2020-10-05 ENCOUNTER — Other Ambulatory Visit: Payer: Self-pay

## 2020-10-05 ENCOUNTER — Encounter (HOSPITAL_COMMUNITY)
Admission: RE | Disposition: A | Discharge status: Home / Self Care | Payer: Self-pay | Source: Ambulatory Visit | Attending: Internal Medicine

## 2020-10-05 DIAGNOSIS — Z79899 Other long term (current) drug therapy: Secondary | ICD-10-CM | POA: Diagnosis not present

## 2020-10-05 DIAGNOSIS — I480 Paroxysmal atrial fibrillation: Secondary | ICD-10-CM | POA: Diagnosis not present

## 2020-10-05 DIAGNOSIS — I420 Dilated cardiomyopathy: Secondary | ICD-10-CM | POA: Insufficient documentation

## 2020-10-05 DIAGNOSIS — I5022 Chronic systolic (congestive) heart failure: Secondary | ICD-10-CM | POA: Diagnosis not present

## 2020-10-05 DIAGNOSIS — Z882 Allergy status to sulfonamides status: Secondary | ICD-10-CM | POA: Diagnosis not present

## 2020-10-05 DIAGNOSIS — Z96653 Presence of artificial knee joint, bilateral: Secondary | ICD-10-CM | POA: Diagnosis not present

## 2020-10-05 DIAGNOSIS — I110 Hypertensive heart disease with heart failure: Secondary | ICD-10-CM | POA: Diagnosis not present

## 2020-10-05 DIAGNOSIS — Z8249 Family history of ischemic heart disease and other diseases of the circulatory system: Secondary | ICD-10-CM | POA: Diagnosis not present

## 2020-10-05 DIAGNOSIS — Z95810 Presence of automatic (implantable) cardiac defibrillator: Secondary | ICD-10-CM | POA: Insufficient documentation

## 2020-10-05 DIAGNOSIS — Z7901 Long term (current) use of anticoagulants: Secondary | ICD-10-CM | POA: Diagnosis not present

## 2020-10-05 DIAGNOSIS — Z96641 Presence of right artificial hip joint: Secondary | ICD-10-CM | POA: Insufficient documentation

## 2020-10-05 DIAGNOSIS — Z8719 Personal history of other diseases of the digestive system: Secondary | ICD-10-CM | POA: Diagnosis not present

## 2020-10-05 DIAGNOSIS — Z28311 Partially vaccinated for covid-19: Secondary | ICD-10-CM | POA: Insufficient documentation

## 2020-10-05 DIAGNOSIS — R918 Other nonspecific abnormal finding of lung field: Secondary | ICD-10-CM

## 2020-10-05 DIAGNOSIS — Z888 Allergy status to other drugs, medicaments and biological substances status: Secondary | ICD-10-CM | POA: Diagnosis not present

## 2020-10-05 DIAGNOSIS — Z8616 Personal history of COVID-19: Secondary | ICD-10-CM | POA: Insufficient documentation

## 2020-10-05 HISTORY — PX: VIDEO BRONCHOSCOPY: SHX5072

## 2020-10-05 HISTORY — PX: BRONCHIAL WASHINGS: SHX5105

## 2020-10-05 LAB — BODY FLUID CELL COUNT WITH DIFFERENTIAL
Eos, Fluid: 2 %
Lymphs, Fluid: 25 %
Monocyte-Macrophage-Serous Fluid: 54 % (ref 50–90)
Neutrophil Count, Fluid: 19 % (ref 0–25)
Total Nucleated Cell Count, Fluid: 113 cu mm (ref 0–1000)

## 2020-10-05 LAB — PNEUMOCYSTIS JIROVECI SMEAR BY DFA: Pneumocystis jiroveci Ag: NEGATIVE

## 2020-10-05 SURGERY — VIDEO BRONCHOSCOPY WITHOUT FLUORO
Anesthesia: Moderate Sedation

## 2020-10-05 MED ORDER — MIDAZOLAM HCL (PF) 10 MG/2ML IJ SOLN
INTRAMUSCULAR | Status: DC | PRN
  Administered 2020-10-05 (×2): 2 mg via INTRAVENOUS
  Administered 2020-10-05: 1 mg via INTRAVENOUS

## 2020-10-05 MED ORDER — LIDOCAINE HCL URETHRAL/MUCOSAL 2 % EX GEL
CUTANEOUS | Status: AC
  Filled 2020-10-05: qty 30

## 2020-10-05 MED ORDER — LIDOCAINE HCL 1 % IJ SOLN
INTRAMUSCULAR | Status: DC | PRN
  Administered 2020-10-05 (×2): 2 mL

## 2020-10-05 MED ORDER — LIDOCAINE HCL (PF) 4 % IJ SOLN
INTRAMUSCULAR | Status: AC
  Filled 2020-10-05: qty 5

## 2020-10-05 MED ORDER — PHENYLEPHRINE HCL 0.25 % NA SOLN
1.0000 | Freq: Four times a day (QID) | NASAL | Status: DC | PRN
  Administered 2020-10-05: 1 via NASAL

## 2020-10-05 MED ORDER — LIDOCAINE HCL URETHRAL/MUCOSAL 2 % EX GEL
1.0000 "application " | Freq: Once | CUTANEOUS | Status: AC
  Administered 2020-10-05: 1 via TOPICAL

## 2020-10-05 MED ORDER — DIPHENHYDRAMINE HCL 50 MG/ML IJ SOLN
INTRAMUSCULAR | Status: AC
  Filled 2020-10-05: qty 1

## 2020-10-05 MED ORDER — LIDOCAINE HCL 4 % EX SOLN
Freq: Once | CUTANEOUS | Status: DC
  Filled 2020-10-05: qty 50

## 2020-10-05 MED ORDER — FENTANYL CITRATE (PF) 100 MCG/2ML IJ SOLN
INTRAMUSCULAR | Status: DC | PRN
  Administered 2020-10-05 (×2): 50 ug via INTRAVENOUS

## 2020-10-05 MED ORDER — PHENYLEPHRINE HCL 0.25 % NA SOLN
NASAL | Status: AC
  Filled 2020-10-05: qty 15

## 2020-10-05 MED ORDER — LIDOCAINE HCL 4 % EX SOLN
CUTANEOUS | Status: DC | PRN
  Administered 2020-10-05: 5 mL via TOPICAL

## 2020-10-05 MED ORDER — LIDOCAINE HCL 1 % IJ SOLN
INTRAMUSCULAR | Status: AC
  Filled 2020-10-05: qty 20

## 2020-10-05 MED ORDER — FENTANYL CITRATE (PF) 100 MCG/2ML IJ SOLN
INTRAMUSCULAR | Status: AC
  Filled 2020-10-05: qty 4

## 2020-10-05 MED ORDER — MIDAZOLAM HCL (PF) 5 MG/ML IJ SOLN
INTRAMUSCULAR | Status: AC
  Filled 2020-10-05: qty 2

## 2020-10-05 NOTE — Op Note (Addendum)
Name:  Terry [name] MRN:  [id number] DOB:  [birth year]  PROCEDURE NOTE  Procedure(s): Flexible bronchoscopy [id number]) Bronchial alveolar lavage [phone]) of the left lower lobe  Indications:  Pulmonary infiltrates lower lobe, Rule out MAI  Consent:  Procedure, benefits, risks and alternatives discussed.  Questions answered.  Consent obtained.  Anesthesia:  Moderate Sedation  Location: [place]  Procedure summary:  Appropriate equipment was assembled.  The patient was brought to the procedure suite room and identified as Terry [name] with [birth year] . Safety timeout was performed. The patient was placed supine on the  table, airway and moderate sedation administered by this operator    After the appropriate level of moderation was assured, flexible video bronchoscope was lubricated and inserted through the left naris   Airway examination was yes performed bilaterally to subsegmental level.  Minimal clear secretions were noted, mucosa appeared normal and no endobronchial lesions were identified.  Bronchial alveolar lavage of the left lower lobe was performed with 20 mL of normal saline  x 1 to cannister followed by 40cc + 40cc +30cc number of times  Total return of 35 mL of fluid, after which the bronchoscope was withdrawn.    After hemostasis was assure, the bronchoscope was withdrawn.  The patient was recovered and then  transferred to recovery area  Post-procedure chest x-ray was NOT ordered.  Specimens sent: Bronchial alveolar lavage specimen of the Left lower lobe for cell count   microbiology and cytology.  Complications:  No immediate complications were noted.    Noted: soon after sedatio of fentanyl 50mg x 2 and versed 225mx 2 given in slow increments patient had transient desaturations and drop in BP. Both corrected after bite block and fluids respectively. Procedure started after that. During procedure BP/Pulse ox remained stable  Estimated blood loss:   NOne  IMPRESSION 1. Normal airway exam 2. Left lower lobe lavage   Wife updated 8:51 AM 10/05/2020   Followup Future Appointments  Date Time Provider De[place]9/14/2022  9:00 AM WaMartyn EhrichNP [facility] None  11/05/2020  8:30 AM TaEvans LanceMD [facility]  12/06/2020  9:00 AM RaBrand MalesMD [facility] None  12/07/2020  7:35 AM [facility] DEVICE REMOTES [facility]  01/19/2021 10:00 AM [facility] OP [facility]/08/2020 11:00 AM McLarey DresserMD [facility] None  03/08/2021  7:35 AM [facility] DEVICE REMOTES [facility]     SIGNATURE    Dr. MuBrand MalesM.D., F.C.C.P,  Pulmonary and Critical Care Medicine Staff Physician, [place]irector - Interstitial Lung Disease  Program  Pulmonary [id number] Number:  NPI #1#[id number]: [phone] no answer  -> [place] [address]Lavalletteelephone (clinical office): [phone] Telephone (research): [phone]  8:44 AM 10/05/2020

## 2020-10-05 NOTE — Discharge Instructions (Signed)
BRONCHOSCOPY DISCHARGE INSTRUCTIONS  - Please have someone to drive you home - Please be careful with activities for next 24 hours - You can eat 2-4 hours after getting home provided you are fully alert, able to cough, and are not nauseated or vomiting and     feel well - You are expected to have low grade fever or cough some amount of blood for next 24-48 hours; if this worsens call us - If you are very short of breath or coughing blood or chest pain or not feeling well, call us [phone] anytime or go to emergency room - For followup appointment - see below. If not there please call [phone] to make an appointment with Dr Marchelle Gearing or nurse practitioner   Future Appointments  Date Time Provider Department Center  10/27/2020  9:00 AM Glenford Bayley, NP [facility] None  11/05/2020  8:30 AM [name], MD [facility]  12/06/2020  9:00 AM Kalman Shan, MD [facility] None  12/07/2020  7:35 AM [facility] DEVICE REMOTES [facility]  01/19/2021 10:00 AM [facility] ECHO OP 1 [facility]  01/19/2021 11:00 AM Laurey Morale, MD [facility] None  03/08/2021  7:35 AM [facility] DEVICE REMOTES [facility]

## 2020-10-05 NOTE — Interval H&P Note (Signed)
Interval hx pre procedure on 10/05/2020 7:31 AM Follow-up mild chronic cough in the setting of Entresto Follow-up history of rheumatoid arthritis not on immunomodulators Follow-up nonspecific CT scan infiltrates with multiple lung nodules -no shortness of breath  S: No interims complaints. DOAC on hold > 24h - actually last dose 48h ago  O Vitals stable Exam unchange  Allergies  Allergen Reactions   Other Other (See Comments)   Spironolactone     Gynecomastia   Sulfa Antibiotics     Thrush and hives      Current Facility-Administered Medications:    lidocaine (XYLOCAINE) 4 % external solution, , Topical, Once, Kalman Shan, MD IMPRESSION: 1. Moderate patchy tree-in-bud opacities and scattered centrilobular nodules in the peripheral lungs bilaterally, most prominent at the lung bases, overall increased, although with some waxing and waning behavior. Minimal cylindrical bronchiolectasis at the lung bases. Findings are most compatible with a nonspecific chronic or recurrent infectious or inflammatory bronchiolitis, with the differential including atypical mycobacterial infection (MAI) or recurrent aspiration. 2. The largest new pulmonary nodule measures 7 mm in the posterior left lower lobe, favor inflammatory. Suggest attention on follow-up chest CT in 6-12 months. 3. 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 Recommend annual imaging followup by CTA or MRA. This recommendation follows 2010 ACCF/AHA/AATS/ACR/ASA/SCA/SCAI/SIR/STS/SVM Guidelines for the Diagnosis and Management of Patients with Thoracic Aortic Disease. Circulation. 2010; 121: O962-X528. Aortic aneurysm NOS (ICD10-I71.9). 4. Three-vessel coronary atherosclerosis. 5. Cholelithiasis. 6. Aortic Atherosclerosis (ICD10-I70.0).     Electronically Signed   By: Delbert Phenix M.D.   On: 08/17/2020 11:40     A/P Follow-up mild chronic cough in the setting of Entresto Follow-up history of  rheumatoid arthritis not on immunomodulators Follow-up nonspecific CT scan infiltrates with multiple lung nodules -Lower lobe  P  Risks of pneumothorax, hemothorax, sedation/anesthesia complications such as cardiac or respiratory arrest or hypotension, stroke and bleeding all explained. Benefits of diagnosis but limitations of non-diagnosis also explained. Patient verbalized understanding and wished to proceed.      SIGNATURE    Dr. Kalman Shan, M.D., F.C.C.P,  Pulmonary and Critical Care Medicine Staff Physician, [facility] Director - Interstitial Lung Disease  Program  Pulmonary Fibrosis [facility] at [facility] [place], [place], [address]  NPI Number:  NPI #[id number]  Pager: [phone], If no answer  -> Check AMION or Try [phone] Telephone (clinical office): [phone] Telephone (research): [phone]  7:33 AM 10/05/2020

## 2020-10-05 NOTE — Progress Notes (Signed)
As noted by MD Ramaswamy, the pt's BP and SpO2 dropped during the procedure which was corrected with a bite block and fluids respectively. Pt remained slightly hypotensive during the postoperative period with SBP in the high 90s and DBP in the 40s. MD Ramaswamy assessed pt at beside and instructed this RN to continue to monitor BP. Pt denied dizziness, lightheadedness, and BP remained stable throughout the postop period. Pt was able to ambulate to and from the restroom without symptoms of hypotension. After ambulation, pt's SBP rose to over 100 mmHg with DBP ranging from high 40s to low 60s and remained stable. MD Marchelle Gearing was notified of pt's BP after ambulation. Pt discharged per MD Ramaswamy's order.  Eulas Post, BSN, RN

## 2020-10-06 ENCOUNTER — Encounter (HOSPITAL_COMMUNITY): Payer: Self-pay | Admitting: Internal Medicine

## 2020-10-06 DIAGNOSIS — R918 Other nonspecific abnormal finding of lung field: Secondary | ICD-10-CM | POA: Diagnosis not present

## 2020-10-06 DIAGNOSIS — Z8616 Personal history of COVID-19: Secondary | ICD-10-CM | POA: Diagnosis not present

## 2020-10-06 DIAGNOSIS — J019 Acute sinusitis, unspecified: Secondary | ICD-10-CM | POA: Diagnosis not present

## 2020-10-06 LAB — ACID FAST SMEAR (AFB, MYCOBACTERIA): Acid Fast Smear: NEGATIVE

## 2020-10-06 LAB — CYTOLOGY - NON PAP

## 2020-10-07 LAB — CULTURE, RESPIRATORY W GRAM STAIN: Culture: NORMAL

## 2020-10-07 NOTE — Telephone Encounter (Signed)
thanks

## 2020-10-08 LAB — MTB RIF NAA W/O CULTURE, SPUTUM

## 2020-10-10 NOTE — Telephone Encounter (Signed)
MR please advise. Thanks  Dr. 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 What dos this mean in simple terms and what it means for mean.  Thanks, Terry [name]

## 2020-10-11 NOTE — Telephone Encounter (Signed)
Triage  Please call lab and ask them what thy mean by this, word REFERT in the  BAL reults. Give them the following  Please refer to the following specimen for additional lab results.       Reference [phone] for BAL specific test# [id number]

## 2020-10-12 NOTE — Telephone Encounter (Signed)
Call made to [facility]. Per [facility] it was a typo and it actually means refer. Notes they use in the lab to alert the staff to refer to the comments in the actual lab report.

## 2020-10-12 NOTE — Telephone Encounter (Signed)
Call made to [facility]. Was placed in hold for 17 minutes. Triage now has 15 calls. Will try back again later.

## 2020-10-19 ENCOUNTER — Other Ambulatory Visit: Payer: Self-pay | Admitting: Cardiology

## 2020-10-19 NOTE — Telephone Encounter (Signed)
Prescription refill request for Xarelto received.  Indication: Afib  Last office visit: 04/14/20 Mayford Knife)  Weight: 90.7kg Age: 65 Scr:1.55 (09/09/20) CrCl: 60.5ml/min  Appropriate dose and refill sent to requested pharmacy.

## 2020-10-21 ENCOUNTER — Telehealth: Payer: Self-pay | Admitting: Internal Medicine

## 2020-10-21 ENCOUNTER — Other Ambulatory Visit: Payer: Self-pay

## 2020-10-21 ENCOUNTER — Ambulatory Visit (INDEPENDENT_AMBULATORY_CARE_PROVIDER_SITE_OTHER): Payer: Medicare Other | Admitting: Otolaryngology

## 2020-10-21 DIAGNOSIS — H9202 Otalgia, left ear: Secondary | ICD-10-CM | POA: Diagnosis not present

## 2020-10-21 NOTE — Telephone Encounter (Signed)
Pt's appt on 9/14 has been changed to a televisit as MR said that it was okay for that appt to be a televisit. Attempted to call pt to let him knwo this had been done but unable to reach. Left pt a detailed message letting him know that his appt will be a televisit on 9/14. Nothing further needed.

## 2020-10-21 NOTE — Progress Notes (Signed)
HPI: Terry [name] is a 65 y.o. male who returns today for evaluation of left ear discomfort.  It is doing better today.  He thought he might have a "inner ear" infection.  He does use Q-tips to clean wax out of his ears.  He is having no pain today and no real hearing problems.  He is getting ready to go on a trip to [place] in 2weeks..  Past Medical History:  Diagnosis Date   AICD (automatic cardioverter/defibrillator) present    Anemia    "S/P knee & hip OR"   Aortic atherosclerosis (HCC)    Aortic insufficiency    mild    Ascending aorta dilation (HCC)    61mm by echo and CT 01/2020   Chronic systolic CHF (congestive heart failure), NYHA class 1 (HCC)    Complication of anesthesia    hx of irregular beat after anesthesia in ~ 1990's   GERD (gastroesophageal reflux disease)    H/O hematuria    H/O hiatal hernia    Hepatitis 1960's   "caught it from my brother"   History of blood transfusion 2013; 2014   S/P knee and hip OR   Hypertension    Nonischemic dilated cardiomyopathy (HCC)    EF 40-45% by echo 2019.  S/P AICD for primary prevention.  Repeat EF 01/2020 35-40%.    PAF (paroxysmal atrial fibrillation) (HCC)    "off and on since 2013" (08/05/2014)   PVC (premature ventricular contraction)    PVC load 8%   Rheumatoid arthritis (HCC) dx'd 1995   Past Surgical History:  Procedure Laterality Date   BRONCHIAL WASHINGS  10/05/2020   Procedure: BRONCHIAL WASHINGS;  Surgeon: Kalman Shan, MD;  Location: [facility];  Service: Endoscopy;;   CARDIAC CATHETERIZATION  ~ 2004   normal   CARDIAC CATHETERIZATION N/A 09/07/2014   Procedure: Left Heart Cath and Coronary Angiography;  Surgeon: Laurey Morale, MD;  Location[facility] INVASIVE CV LAB;  Service: Cardiovascular;  Laterality: N/A;   CARDIAC DEFIBRILLATOR PLACEMENT  08/05/2014   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    JOINT REPLACEMENT     NASAL SEPTUM SURGERY  ~ 1975   TONSILLECTOMY     TOTAL HIP ARTHROPLASTY  09/08/2011   Procedure: TOTAL HIP ARTHROPLASTY ANTERIOR APPROACH;  Surgeon: [name], MD;  Location: [facility];  Service: Orthopedics;  Laterality: Right;  Right total hip replacement, left knee steroid injection   TOTAL KNEE ARTHROPLASTY Left 11/29/2012   Procedure: LEFT TOTAL KNEE ARTHROPLASTY;  Surgeon: [name], MD;  Location: [facility];  Service: Orthopedics;  Laterality: Left;  FEMORAL NERVE BLOCK IN HOLDING AREA LEFT LEG   TOTAL KNEE ARTHROPLASTY Right 11/30/2017   Procedure: RIGHT TOTAL KNEE ARTHROPLASTY;  Surgeon: [name], MD;  Location: [facility];  Service: Orthopedics;  Laterality: Right;   URETHRAL FISTULA REPAIR  ~ 1996   URETHRAL STRICTURE DILATATION  "several times"   VIDEO BRONCHOSCOPY N/A 10/05/2020   Procedure: VIDEO BRONCHOSCOPY WITHOUT FLUORO;  Surgeon: Kalman Shan, MD;  Location: [facility];  Service: Endoscopy;  Laterality: N/A;   Social History   Socioeconomic History   Marital status: Married    Spouse name: Not on file   Number of children: Not on file   Years of education: Not on file   Highest education level: Not on file  Occupational History   Not on file  Tobacco Use   Smoking status: Never   Smokeless tobacco: Never  Vaping Use   Vaping Use: Never used  Substance and Sexual Activity   Alcohol use: Yes    Alcohol/week: 1.0 standard drink    Types: 1 Cans of beer per week   Drug use: No   Sexual activity: Yes  Other Topics Concern   Not on file  Social History Narrative   Not on file   Social Determinants of Health    Financial Resource Strain: Not on file  Food Insecurity: Not on file  Transportation Needs: Not on file  Physical Activity: Not on file  Stress: Not on file  Social Connections: Not on file   Family History  Problem Relation Age of Onset   Heart disease Father    Heart attack Father 25   Heart failure Brother    Allergies  Allergen Reactions   Other Other (See Comments)   Spironolactone     Gynecomastia   Sulfa Antibiotics     Thrush and hives   Prior to Admission medications   Medication Sig Start Date End Date Taking? Authorizing Provider  atorvastatin (LIPITOR) 20 MG tablet Take 1 tablet (20 mg total) by mouth daily. 02/09/20   Quintella Reichert, MD  carvedilol (COREG) 25 MG tablet TAKE 1 TABLET BY MOUTH TWICE A DAY 12/09/19   Quintella Reichert, MD  dapagliflozin propanediol (FARXIGA) 10 MG TABS tablet Take 1 tablet (10 mg total) by mouth daily before breakfast. 01/20/20   Laurey Morale, MD  dofetilide (TIKOSYN) 250 MCG capsule TAKE 1 CAPSULE (250 MCG TOTAL) BY MOUTH 2 TIMES DAILY. PLEASE CALL FOR OFFICE VISIT [phone] 02/20/20   Laurey Morale, MD  ENTRESTO 97-103 MG TAKE 1 TABLET BY MOUTH TWICE A DAY 08/30/20   Quintella Reichert, MD  eplerenone (INSPRA) 50 MG tablet TAKE 1 TABLET BY MOUTH EVERY DAY 09/13/20   Laurey Morale, MD  furosemide (LASIX) 40 MG tablet TAKE 1 TABLET BY MOUTH EVERY DAY 06/01/20   Laurey Morale, MD  multivitamin-iron-minerals-folic acid (CENTRUM) chewable tablet Chew 1 tablet by mouth daily.    [provider]  XARELTO 20 MG TABS tablet TAKE 1 TABLET BY MOUTH DAILY WITH SUPPER 10/19/20   Quintella Reichert, MD     Positive ROS: Otherwise negative  All other systems have been reviewed and were otherwise negative with the exception of those mentioned in the HPI and as above.  Physical Exam: Constitutional: Alert, well-appearing, no acute distress Ears: External ears without lesions or tenderness. Ear canals are clear bilaterally with intact,  clear TMs.  He has a very small abrasion in the left ear canal from use of Q-tips but the TM is clear with good mobility on pneumatic otoscopy and no middle ear effusion noted.  Hearing screening with a tuning forks revealed symmetric hearing in both ears with AC > BC bilaterally.  No significant wax buildup. Nasal: External nose without lesions. Septum with minimal deformity and mild rhinitis. Clear nasal passages otherwise with no signs of infection. Oral: Lips and gums without lesions. Tongue and palate mucosa without lesions. Posterior oropharynx  clear.  Tonsil regions are benign in appearance.  He is status post tonsillectomy. Neck: No palpable adenopathy or masses.  No pain to wiggling the ear.  No adenopathy noted in the neck.  On examination of the TMJ area no pain in the TMJ area. Respiratory: Breathing comfortably  Skin: No facial/neck lesions or rash noted.  Procedures  Assessment: Left ear pain questionable etiology.  Normal exam in the office today.  The ear is feeling better.  Plan: Recommended use of Flonase 2 sprays each nostril at night as this will help with congestion and prevent ear problems especially when he flies.  He has Flonase at home. Cautioned him about not using Q-tips in the ears.   Narda Bonds, MD

## 2020-10-24 ENCOUNTER — Other Ambulatory Visit: Payer: Self-pay | Admitting: Cardiology

## 2020-10-27 ENCOUNTER — Ambulatory Visit: Payer: Medicare Other | Admitting: Primary Care

## 2020-10-28 ENCOUNTER — Telehealth (INDEPENDENT_AMBULATORY_CARE_PROVIDER_SITE_OTHER): Payer: Medicare Other | Admitting: Adult Health

## 2020-10-28 ENCOUNTER — Other Ambulatory Visit: Payer: Self-pay

## 2020-10-28 ENCOUNTER — Encounter: Payer: Self-pay | Admitting: Adult Health

## 2020-10-28 DIAGNOSIS — R918 Other nonspecific abnormal finding of lung field: Secondary | ICD-10-CM

## 2020-10-28 NOTE — Patient Instructions (Signed)
Continue on current regimen. Follow-up with CT chest next month. Keep follow-up appointment with Dr. Marchelle Gearing next month and as needed

## 2020-10-28 NOTE — Progress Notes (Signed)
09/30-10/15 Patient reports that he will be out of the country and wants to check on CT.

## 2020-10-28 NOTE — Progress Notes (Signed)
Virtual Visit via Video Note  I connected with Terry [name] on 10/28/20 at 12:00 PM EDT by a video enabled telemedicine application and verified that I am speaking with the correct person using two identifiers.  Location: Patient: Home  Provider: Office    I discussed the limitations of evaluation and management by telemedicine and the availability of in person appointments. The patient expressed understanding and agreed to proceed.  History of Present Illness: 65 year old male seen for pulmonary consult March 26, 2020 for abnormal CT chest with pulmonary nodules and chronic cough Medical history significant for rheumatoid arthritis, chronic systolic congestive heart failure due to nonischemic cardiomyopathy status post [facility] ICD June 2016.  (Echo 2006 EF 15 to 20%), A. Fib COVID infection May 2022 reported as mild symptoms  Today's video visit is a 6-week follow-up.  Patient is being followed for abnormal CT chest with scattered pulmonary nodules.  And chronic cough in the setting of Entresto.  Patient does have congestive heart failure/cardiomyopathy.  Recent CT chest done August 17, 2020 showed patchy tree-in-bud opacities and scattered nodules maximum at 7 mm.  And some minimal bronchiectasis.  Patient was set up for bronchoscopy with lavage.  This was done on October 05, 2020 airway exam showed minimum clear secretions, normal mucosa and no visible endobronchial lesions.  BAL culture was negative with normal respiratory flora, AFB culture was negative, pneumocystis jiroveci Ag negative, fungal culture negative, cytology identified no malignant cells, benign bronchial cells and pulmonary macrophages. Patient says overall his breathing is doing okay.  He remains very active.  O2 saturations at home remain around 98% on room air.  Patient swims at least 45 minutes each day.  Denies any significant cough. We reviewed his culture reports.  Past Medical History:  Diagnosis Date   AICD  (automatic cardioverter/defibrillator) present    Anemia    "S/P knee & hip OR"   Aortic atherosclerosis (HCC)    Aortic insufficiency    mild    Ascending aorta dilation (HCC)    77m by echo and CT 123/7628  Chronic systolic CHF (congestive heart failure), NYHA class 1 (HCC)    Complication of anesthesia    hx of irregular beat after anesthesia in ~ 1990's   GERD (gastroesophageal reflux disease)    H/O hematuria    H/O hiatal hernia    Hepatitis 1960's   "caught it from my brother"   History of blood transfusion 2013; 2014   S/P knee and hip OR   Hypertension    Nonischemic dilated cardiomyopathy ([place]    EF 40-45% by echo 2019.  S/P AICD for primary prevention.  Repeat EF 01/2020 35-40%.    PAF (paroxysmal atrial fibrillation) ([place]    "off and on since 2013" (08/05/2014)   PVC (premature ventricular contraction)    PVC load 8%   Rheumatoid arthritis ([place] dx[place] 1995   Current Outpatient Medications on File Prior to Visit  Medication Sig Dispense Refill   atorvastatin (LIPITOR) 20 MG tablet Take 1 tablet (20 mg total) by mouth daily. 90 tablet 3   carvedilol (COREG) 25 MG tablet TAKE 1 TABLET BY MOUTH TWICE A DAY 180 tablet 1   dapagliflozin propanediol (FARXIGA) 10 MG TABS tablet Take 1 tablet (10 mg total) by mouth daily before breakfast. 90 tablet 3   dofetilide (TIKOSYN) 250 MCG capsule TAKE 1 CAPSULE (250 MCG TOTAL) BY MOUTH 2 TIMES DAILY. PLEASE CALL FOR OFFICE VISIT [phone] capsule 11   ENTRESTO  97-103 MG TAKE 1 TABLET BY MOUTH TWICE A DAY 180 tablet 1   eplerenone (INSPRA) 50 MG tablet TAKE 1 TABLET BY MOUTH EVERY DAY 90 tablet 1   furosemide (LASIX) 40 MG tablet TAKE 1 TABLET BY MOUTH EVERY DAY 90 tablet 3   multivitamin-iron-minerals-folic acid (CENTRUM) chewable tablet Chew 1 tablet by mouth daily.     XARELTO 20 MG TABS tablet TAKE 1 TABLET BY MOUTH DAILY WITH SUPPER 30 tablet 5   No current facility-administered medications on file prior to visit.     Observations/Objective: 2D echo December 2021 EF at 35 to 40%, LV global hypokinesis, grade 1 diastolic dysfunction, pulmonary artery pressure 25 mmHg.  Left atrium severely dilated, right atrium moderately dilated  CT chest December 2021 scattered areas of tree-in-bud nodularity at the bases more focal collection of nodules and groundglass findings likely related to chronic infection maximum 5 mm.  CT chest August 17, 2020 moderate patchy tree-in-bud opacities and scattered nodules in the peripheral lung bases some waxing and waning behavior previous 5 mm right lower lobe nodule resolved new 7 mm left lower lobe nodule and new right lower lobe nodule.  Minimum bronchiectasis  Assessment and Plan:  Abnormal CT chest with scattered pulmonary nodules and tree-in-bud opacities questionable etiology.  Recent bronchoscopy with BAL-all cultures are negative, cytology with negative malignant cells. AFB negative. Patient has upcoming CT chest patient is advised to keep this and follow-up next month as planned  Plan  Patient Instructions  Continue on current regimen. Follow-up with CT chest next month. Keep follow-up appointment with Dr. Chase Caller next month and as needed     Follow Up Instructions:    I discussed the assessment and treatment plan with the patient. The patient was provided an opportunity to ask questions and all were answered. The patient agreed with the plan and demonstrated an understanding of the instructions.   The patient was advised to call back or seek an in-person evaluation if the symptoms worsen or if the condition fails to improve as anticipated.  I provided 22  minutes of non-face-to-face time during this encounter.   Rexene Edison, NP

## 2020-10-28 NOTE — Assessment & Plan Note (Signed)
Abnormal CT chest with scattered pulmonary nodules and tree-in-bud opacities questionable etiology.  Recent bronchoscopy with BAL-all cultures are negative, cytology with negative malignant cells. AFB negative. Patient has upcoming CT chest patient is advised to keep this and follow-up next month as planned  Plan  Patient Instructions  Continue on current regimen. Follow-up with CT chest next month. Keep follow-up appointment with Dr. Chase Caller next month and as needed

## 2020-10-29 LAB — MTB RIF NAA NON-SPUTUM, W/O CULTURE

## 2020-11-05 ENCOUNTER — Other Ambulatory Visit: Payer: Self-pay

## 2020-11-05 ENCOUNTER — Ambulatory Visit: Payer: Medicare Other | Admitting: Internal Medicine

## 2020-11-05 VITALS — BP 96/58 | HR 60 | Ht 71.0 in | Wt 207.0 lb

## 2020-11-05 DIAGNOSIS — I480 Paroxysmal atrial fibrillation: Secondary | ICD-10-CM

## 2020-11-05 DIAGNOSIS — Z95810 Presence of automatic (implantable) cardiac defibrillator: Secondary | ICD-10-CM | POA: Diagnosis not present

## 2020-11-05 DIAGNOSIS — I493 Ventricular premature depolarization: Secondary | ICD-10-CM | POA: Diagnosis not present

## 2020-11-05 DIAGNOSIS — I5022 Chronic systolic (congestive) heart failure: Secondary | ICD-10-CM | POA: Diagnosis not present

## 2020-11-05 DIAGNOSIS — I420 Dilated cardiomyopathy: Secondary | ICD-10-CM | POA: Diagnosis not present

## 2020-11-05 NOTE — Progress Notes (Signed)
HPI [name] returns today for followup. He is a pleasant 65 yo man with a non-ischemic CM, persistent atrial fib, s/p ICD insertion. He denies palpitations, chest pain or sob. He has been swimming regularly. He notes an uptick in his cough since his Sherryll Burger was uptitrated. He has not had syncope. No ICD shock. He was in the hospital last month for work up of ground glass opacities in his lungs with bronchoscopy.  Allergies  Allergen Reactions   Other Other (See Comments)   Spironolactone     Gynecomastia   Sulfa Antibiotics     Thrush and hives     Current Outpatient Medications  Medication Sig Dispense Refill   atorvastatin (LIPITOR) 20 MG tablet Take 1 tablet (20 mg total) by mouth daily. 90 tablet 3   carvedilol (COREG) 25 MG tablet TAKE 1 TABLET BY MOUTH TWICE A DAY 180 tablet 1   dapagliflozin propanediol (FARXIGA) 10 MG TABS tablet Take 1 tablet (10 mg total) by mouth daily before breakfast. 90 tablet 3   dofetilide (TIKOSYN) 250 MCG capsule TAKE 1 CAPSULE (250 MCG TOTAL) BY MOUTH 2 TIMES DAILY. PLEASE CALL FOR OFFICE VISIT [phone] 60 capsule 11   ENTRESTO 97-103 MG TAKE 1 TABLET BY MOUTH TWICE A DAY 180 tablet 1   eplerenone (INSPRA) 50 MG tablet TAKE 1 TABLET BY MOUTH EVERY DAY 90 tablet 1   furosemide (LASIX) 40 MG tablet TAKE 1 TABLET BY MOUTH EVERY DAY 90 tablet 3   multivitamin-iron-minerals-folic acid (CENTRUM) chewable tablet Chew 1 tablet by mouth daily.     XARELTO 20 MG TABS tablet TAKE 1 TABLET BY MOUTH DAILY WITH SUPPER 30 tablet 5   No current facility-administered medications for this visit.     Past Medical History:  Diagnosis Date   AICD (automatic cardioverter/defibrillator) present    Anemia    "S/P knee & hip OR"   Aortic atherosclerosis (HCC)    Aortic insufficiency    mild    Ascending aorta dilation (HCC)    58mm by echo and CT 01/2020   Chronic systolic CHF (congestive heart failure), NYHA class 1 (HCC)    Complication of  anesthesia    hx of irregular beat after anesthesia in ~ 1990's   GERD (gastroesophageal reflux disease)    H/O hematuria    H/O hiatal hernia    Hepatitis 1960's   "caught it from my brother"   History of blood transfusion 2013; 2014   S/P knee and hip OR   Hypertension    Nonischemic dilated cardiomyopathy (HCC)    EF 40-45% by echo 2019.  S/P AICD for primary prevention.  Repeat EF 01/2020 35-40%.    PAF (paroxysmal atrial fibrillation) (HCC)    "off and on since 2013" (08/05/2014)   PVC (premature ventricular contraction)    PVC load 8%   Rheumatoid arthritis (HCC) dx'd 1995    ROS:   All systems reviewed and negative except as noted in the HPI.   Past Surgical History:  Procedure Laterality Date   BRONCHIAL WASHINGS  10/05/2020   Procedure: BRONCHIAL WASHINGS;  Surgeon: Kalman Shan, MD;  Location: [facility];  Service: Endoscopy;;   CARDIAC CATHETERIZATION  ~ 2004   normal   CARDIAC CATHETERIZATION N/A 09/07/2014   Procedure: Left Heart Cath and Coronary Angiography;  Surgeon: Laurey Morale, MD;  Location: [facility] INVASIVE CV LAB;  Service: Cardiovascular;  Laterality: N/A;   CARDIAC DEFIBRILLATOR PLACEMENT  08/05/2014   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    JOINT REPLACEMENT     NASAL SEPTUM SURGERY  ~ 1975   TONSILLECTOMY     TOTAL HIP ARTHROPLASTY  09/08/2011   Procedure: TOTAL HIP ARTHROPLASTY ANTERIOR APPROACH;  Surgeon: [name], MD;  Location: [facility];  Service: Orthopedics;  Laterality: Right;  Right total hip replacement, left knee steroid injection    TOTAL KNEE ARTHROPLASTY Left 11/29/2012   Procedure: LEFT TOTAL KNEE ARTHROPLASTY;  Surgeon: [name], MD;  Location: [facility];  Service: Orthopedics;  Laterality: Left;  FEMORAL NERVE BLOCK IN HOLDING AREA LEFT LEG   TOTAL KNEE ARTHROPLASTY Right 11/30/2017   Procedure: RIGHT TOTAL KNEE ARTHROPLASTY;  Surgeon: [name], MD;  Location: [facility];  Service: Orthopedics;  Laterality: Right;   URETHRAL FISTULA REPAIR  ~ 1996   URETHRAL STRICTURE DILATATION  "several times"   VIDEO BRONCHOSCOPY N/A 10/05/2020   Procedure: VIDEO BRONCHOSCOPY WITHOUT FLUORO;  Surgeon: Kalman Shan, MD;  Location: [facility];  Service: Endoscopy;  Laterality: N/A;     Family History  Problem Relation Age of Onset   Heart disease Father    Heart attack Father 46   Heart failure Brother      Social History   Socioeconomic History   Marital status: Married    Spouse name: Not on file   Number of children: Not on file   Years of education: Not on file   Highest education level: Not on file  Occupational History   Not on file  Tobacco Use   Smoking status: Never   Smokeless tobacco: Never  Vaping Use   Vaping Use: Never used  Substance and Sexual Activity   Alcohol use: Yes    Alcohol/week: 1.0 standard drink    Types: 1 Cans of beer per week   Drug use: No   Sexual activity: Yes  Other Topics Concern   Not on file  Social History Narrative   Not on file   Social Determinants of Health   Financial Resource Strain: Not on file  Food Insecurity: Not on file  Transportation Needs: Not on file  Physical Activity: Not on file  Stress: Not on file  Social Connections: Not on file  Intimate Partner Violence: Not on file     BP (!) 96/58   Pulse 60   Ht 5\' 11"  (1.803 m)   Wt 207 lb (93.9 kg)   SpO2 99%   BMI 28.87 kg/m   Physical Exam:  Well appearing NAD HEENT: Unremarkable Neck:  No JVD, no thyromegally Lymphatics:  No adenopathy Back:  No CVA  tenderness Lungs:  Clear with no wheezes HEART:  Regular rate rhythm, no murmurs, no rubs, no clicks Abd:  soft, positive bowel sounds, no organomegally, no rebound, no guarding Ext:  2 plus pulses, no edema, no cyanosis, no clubbing Skin:  No rashes no nodules Neuro:  CN II through XII intact, motor grossly intact  EKG - NSR with PVC's  DEVICE  Normal device function.   Assess/Plan:  1. Chronic  systolic heart failure - his symptoms remain class 2A. He will continue his current meds. 2. ICD - his [employer] single chamber ICD is working normally. 3. Persistent atrial fib - he is maintaining NSR on dofetilide. 4. Coags - he has not had any bleeding on xarelto.    Terry Gowda Amanii Snethen,MD

## 2020-11-05 NOTE — Patient Instructions (Signed)
Medication Instructions:  Your physician recommends that you continue on your current medications as directed. Please refer to the Current Medication list given to you today.  Labwork: None ordered.  Testing/Procedures: None ordered.  Follow-Up: Your physician wants you to follow-up in: one year with Lewayne Bunting, MD or one of the following Advanced Practice Providers on your designated Care Team:   Francis Dowse, [place] Casimiro Needle "Mardelle Matte" Lanna Poche, [place]  Remote monitoring is used to monitor your ICD from home. This monitoring reduces the number of office visits required to check your device to one time per year. It allows [place] to keep an eye on the functioning of your device to ensure it is working properly. You are scheduled for a device check from home on 12/07/2020. You may send your transmission at any time that day. If you have a wireless device, the transmission will be sent automatically. After your physician reviews your transmission, you will receive a postcard with your next transmission date.  Any Other Special Instructions Will Be Listed Below (If Applicable).  If you need a refill on your cardiac medications before your next appointment, please call your pharmacy.

## 2020-11-18 LAB — FUNGUS CULTURE RESULT

## 2020-11-18 LAB — ACID FAST CULTURE WITH REFLEXED SENSITIVITIES (MYCOBACTERIA): Acid Fast Culture: NEGATIVE

## 2020-11-18 LAB — FUNGUS CULTURE WITH STAIN

## 2020-11-18 LAB — FUNGAL ORGANISM REFLEX

## 2020-11-25 ENCOUNTER — Other Ambulatory Visit: Payer: Medicare Other

## 2020-11-29 ENCOUNTER — Other Ambulatory Visit (HOSPITAL_COMMUNITY): Payer: Self-pay | Admitting: Cardiology

## 2020-11-30 DIAGNOSIS — L237 Allergic contact dermatitis due to plants, except food: Secondary | ICD-10-CM | POA: Diagnosis not present

## 2020-12-01 ENCOUNTER — Other Ambulatory Visit: Payer: Self-pay

## 2020-12-01 ENCOUNTER — Ambulatory Visit (INDEPENDENT_AMBULATORY_CARE_PROVIDER_SITE_OTHER)
Admission: RE | Admit: 2020-12-01 | Discharge: 2020-12-01 | Disposition: A | Discharge status: Home / Self Care | Payer: Medicare Other | Source: Ambulatory Visit | Attending: Internal Medicine | Admitting: Internal Medicine

## 2020-12-01 DIAGNOSIS — I700 Atherosclerosis of aorta: Secondary | ICD-10-CM | POA: Diagnosis not present

## 2020-12-01 DIAGNOSIS — R918 Other nonspecific abnormal finding of lung field: Secondary | ICD-10-CM | POA: Diagnosis not present

## 2020-12-01 DIAGNOSIS — I7121 Aneurysm of the ascending aorta, without rupture: Secondary | ICD-10-CM | POA: Diagnosis not present

## 2020-12-01 DIAGNOSIS — R911 Solitary pulmonary nodule: Secondary | ICD-10-CM | POA: Diagnosis not present

## 2020-12-01 DIAGNOSIS — J9811 Atelectasis: Secondary | ICD-10-CM | POA: Diagnosis not present

## 2020-12-06 ENCOUNTER — Ambulatory Visit: Payer: Medicare Other | Admitting: Internal Medicine

## 2020-12-06 ENCOUNTER — Other Ambulatory Visit: Payer: Self-pay

## 2020-12-06 ENCOUNTER — Encounter: Payer: Self-pay | Admitting: Internal Medicine

## 2020-12-06 VITALS — BP 118/68 | HR 64 | Temp 98.1°F | Ht 71.0 in | Wt 203.6 lb

## 2020-12-06 DIAGNOSIS — Z8739 Personal history of other diseases of the musculoskeletal system and connective tissue: Secondary | ICD-10-CM | POA: Diagnosis not present

## 2020-12-06 DIAGNOSIS — Z8616 Personal history of COVID-19: Secondary | ICD-10-CM | POA: Diagnosis not present

## 2020-12-06 DIAGNOSIS — R918 Other nonspecific abnormal finding of lung field: Secondary | ICD-10-CM

## 2020-12-06 NOTE — Progress Notes (Signed)
OV 03/26/2020  Subjective:  Patient ID: Terry [name], male , DOB: 1955/08/18 , age 65 y.o. , MRN: [id number] , ADDRESS: [place] [address] PCP Shirline Frees, MD Patient Care Team: Shirline Frees, MD as PCP - General (Family Medicine) Sueanne Margarita, MD as PCP - Cardiology (Cardiology)  This Provider for this visit: Treatment Team:  Attending Provider: Brand Males, MD    03/26/2020 -   Chief Complaint  Patient presents with   Consult     HPI Terry [name] 65 y.o. -non-smoker but with a long history of rheumatoid arthritis.  He is not on any immunomodulators for over 15 years.  He feels his quality of life is better with the immunomodulators.  He keeps himself physically active.  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  He had a CT scan of the chest for this that showed nonspecific nodules.  I personally visualized this and agree with this.  Some of these are scattered groundglass opacities some of them are tree-in-bud.  Particularly in the right lower lobe and also in the esophageal recess.  He denies any postnasal drip or acid reflux.  He denies any shortness of breath.  Of note he has cough that is chronic for the last 1 year.  He has been on Entresto for a few years.  A year ago Dr. Loralie Champagne his cardiologist increase the [facility] and therefore his cough got worse.  His cough is rated at a scale of 3 out of 10 both early in the morning and late at night.  Early morning is particularly more.  Nevertheless overall the burden is mild.  There is no sputum production.  There is no shortness of breath or wheezing.    CT Chest data -January 28, 2020 personally visualized.  And agree with the findings.   IMPRESSION: 1. Ascending thoracic aorta approximately 4.2 cm maximal caliber. Recommend annual imaging followup by CTA or MRA. This recommendation follows 2010 ACCF/AHA/AATS/ACR/ASA/SCA/SCAI/SIR/STS/SVM Guidelines for the Diagnosis and  Management of Patients with Thoracic Aortic Disease. Circulation. 2010; 121: V400-Q676. Aortic aneurysm NOS (ICD10-I71.9) 2. Scattered areas of tree-in-bud nodularity at the bases. More focal collection of nodules and ground-glass in the as ago esophageal recess. Findings are likely related to chronic infection. Largest nodule approximately 5 mm. Some of these areas show ground-glass features. Non-contrast chest CT at 3-6 months is recommended. If nodules persist and are stable at that time, consider additional non-contrast chest CT examinations at 2 and 4 years. This recommendation follows the consensus statement: Guidelines for Management of Incidental Pulmonary Nodules Detected on CT Images: From the Fleischner Society 2017; Radiology 2017; 284:228-243. 3. Cholelithiasis without evidence of acute cholecystitis. 4. Aortic atherosclerosis.   Aortic Atherosclerosis (ICD10-I70.0).     Electronically Signed   By: Zetta Bills M.D.  OV 09/21/2020  Subjective:  Patient ID: Terry [name], male , DOB: [birth year] , age 14 y.o. , MRN: [id number] , ADDRESS: [place] [address] PCP Shirline Frees, MD Patient Care Team: Shirline Frees, MD as PCP - General (Family Medicine) Sueanne Margarita, MD as PCP - Cardiology (Cardiology)  This Provider for this visit: Treatment Team:  Attending Provider: Brand Males, MD    09/21/2020 -   Chief Complaint  Patient presents with   Follow-up    Pt states he has been doing okay since last visit and denies any concerns.   Follow-up mild chronic cough in the setting of Entresto  Follow-up history of rheumatoid arthritis not on immunomodulators Follow-up nonspecific CT scan infiltrates with multiple lung nodules -no shortness of breath  HPI Linford D Malerba 65 y.o. -returns for follow-up.  Since I last saw him in May 2022 he went to [place] and [place].  While in [place] mid May 2022 he developed mild COVID infection.  He recovered  from it.  He never had much symptoms.  He feels fine right now.  He just has mild cough because of Entresto.  No shortness of breath.  We went over the CT scan results showing waxing and waning tree-in-bud opacities particular in the lower lobe but overall increased.  Also slight increase in nodularity particularly 7 mm left lower lobe.  He is quite worried about this.  His wife indicated that he worries and is anxious quite a bit.  Explained to him in the setting of recent COVID this could be COVID-related accentuation of abnormalities and we could opt to follow-up in several months to see if things settle down.  He reflected on this.  After this we also discussed about the idea of having a bronchoscopy with lavage right now to rule out any MAI.  We went over the differential diagnosis of MAI, interstitial reticular abnormalities with potential to transform into ILD and rheumatoid nodules.  Based on this he wants to proceed with a bronchoscopy with lavage at this point to rule out any infection.  He is preparing to go to [place] in the end of September 2022.  Therefore he wants all his bases covered.   Risks of pneumothorax, hemothorax, sedation/anesthesia complications such as cardiac or respiratory arrest or hypotension, stroke and bleeding all explained. Benefits of diagnosis but limitations of non-diagnosis also explained. Patient verbalized understanding and wished to proceed.      CT Chest data July 2022    IMPRESSION: 1. Moderate patchy tree-in-bud opacities and scattered centrilobular nodules in the peripheral lungs bilaterally, most prominent at the lung bases, overall increased, although with some waxing and waning behavior. Minimal cylindrical bronchiolectasis at the lung bases. Findings are most compatible with a nonspecific chronic or recurrent infectious or inflammatory bronchiolitis, with the differential including atypical mycobacterial infection (MAI) or  recurrent aspiration. 2. The largest new pulmonary nodule measures 7 mm in the posterior left lower lobe, favor inflammatory. Suggest attention on follow-up chest CT in 6-12 months. 3. 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 Recommend annual imaging followup by CTA or MRA. This recommendation follows 2010 ACCF/AHA/AATS/ACR/ASA/SCA/SCAI/SIR/STS/SVM Guidelines for the Diagnosis and Management of Patients with Thoracic Aortic Disease. Circulation. 2010; 121: D664-Q034. Aortic aneurysm NOS (ICD10-I71.9). 4. Three-vessel coronary atherosclerosis. 5. Cholelithiasis. 6. Aortic Atherosclerosis (ICD10-I70.0).     Electronically Signed   By: Ilona Sorrel M.D.   On: 08/17/2020 11:40   Video visit Sept 2022  History of Present Illness: 65 year old male seen for pulmonary consult March 26, 2020 for abnormal CT chest with pulmonary nodules and chronic cough Medical history significant for rheumatoid arthritis, chronic systolic congestive heart failure due to nonischemic cardiomyopathy status post [facility] ICD June 2016.  (Echo 2006 EF 15 to 20%), A. Fib COVID infection May 2022 reported as mild symptoms  Today's video visit is a 6-week follow-up.  Patient is being followed for abnormal CT chest with scattered pulmonary nodules.  And chronic cough in the setting of Entresto.  Patient does have congestive heart failure/cardiomyopathy.  Recent CT chest done August 17, 2020 showed patchy tree-in-bud opacities and scattered nodules maximum at 7  mm.  And some minimal bronchiectasis.  Patient was set up for bronchoscopy with lavage.  This was done on October 05, 2020 airway exam showed minimum clear secretions, normal mucosa and no visible endobronchial lesions.  BAL culture was negative with normal respiratory flora, AFB culture was negative, pneumocystis jiroveci Ag negative, fungal culture negative, cytology identified no malignant cells, benign bronchial cells and pulmonary  macrophages. Patient says overall his breathing is doing okay.  He remains very active.  O2 saturations at home remain around 98% on room air.  Patient swims at least 45 minutes each day.  Denies any significant cough. We reviewed his culture reports.   OV 12/06/2020  Subjective:  Patient ID: Terry [name], male , DOB: 1955/12/24 , age 10 y.o. , MRN: [id number] , ADDRESS: [place] [address] PCP Shirline Frees, MD Patient Care Team: Shirline Frees, MD as PCP - General (Family Medicine) Sueanne Margarita, MD as PCP - Cardiology (Cardiology)  This Provider for this visit: Treatment Team:  Attending Provider: Brand Males, MD    12/06/2020 -   Chief Complaint  Patient presents with   Follow-up    No c/o, follow up on Super D CT    Follow-up mild chronic cough in the setting of Entresto Follow-up history of rheumatoid arthritis not on immunomodulators Follow-up nonspecific CT scan infiltrates with multiple lung nodules -no shortness of breath - July 2022  - bronch bal - neg aug 2022  - CT improved - oct 2022 Mild covi mid may 2022 in [place]  HPI returns for follow Terry [name] 65 y.o. -returns for follow-up.  Presents with his wife.  He just came back from a trip to [place] in [place].  He was walking 5 to 6 miles a day.  Felt really good.  He had bronchoscopy with lavage in August 2022 before this trip and the cultures are negative.  This was reviewed by nurse practitioner.  He now had a follow-up CT scan of the chest that shows significant improvement.  This is reviewed below.  He has no new complaints.  He is up-to-date with his respiratory vaccines.    CT Chest data 12/01/20  IMPRESSION: Decreased size of the LEFT lower lobe pulmonary nodule now 2 mm compatible with a resolving infectious or inflammatory changes.   Patchy tree-in-bud nodularity and subtle ground-glass seen on the previous exam also improved and compatible with improving appearance of  sequela of chronic infection.   Three-vessel coronary artery disease.   Mild aneurysmal dilation of the ascending thoracic aorta 4.1 cm is similar to the prior study. Recommend annual imaging followup by CTA or MRA. This recommendation follows 2010 ACCF/AHA/AATS/ACR/ASA/SCA/SCAI/SIR/STS/SVM Guidelines for the Diagnosis and Management of Patients with Thoracic Aortic Disease. Circulation. 2010; 121: L892-J194. Aortic aneurysm NOS (ICD10-I71.9)   Cholelithiasis.   Aortic Atherosclerosis (ICD10-I70.0).     Electronically Signed   By: Zetta Bills M.D.   On: 12/02/2020 08:45  No results found.    PFT  No flowsheet data found.     has a past medical history of AICD (automatic cardioverter/defibrillator) present, Anemia, Aortic atherosclerosis ([place]), Aortic insufficiency, Ascending aorta dilation ([facility]), Chronic systolic CHF (congestive heart failure), NYHA class 1 ([place]), Complication of anesthesia, GERD (gastroesophageal reflux disease), H/O hematuria, H/O hiatal hernia, Hepatitis (1960's), History of blood transfusion (2013; 2014), Hypertension, Nonischemic dilated cardiomyopathy ([place]), PAF (paroxysmal atrial fibrillation) ([place]), PVC (premature ventricular contraction), and Rheumatoid arthritis ([place]) (dx'd 1995).   reports that he has never  smoked. He has never used smokeless tobacco.  Past Surgical History:  Procedure Laterality Date   BRONCHIAL WASHINGS  10/05/2020   Procedure: BRONCHIAL WASHINGS;  Surgeon: Brand Males, MD;  Location: [facility];  Service: Endoscopy;;   CARDIAC CATHETERIZATION  ~ 2004   normal   CARDIAC CATHETERIZATION N/A 09/07/2014   Procedure: Left Heart Cath and Coronary Angiography;  Surgeon: Larey Dresser, MD;  Location: [place] [facility];  Service: Cardiovascular;  Laterality: N/A;   CARDIAC DEFIBRILLATOR PLACEMENT  08/05/2014   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    JOINT REPLACEMENT     NASAL SEPTUM SURGERY  ~ [place]  09/08/2011   Procedure: TOTAL HIP ARTHROPLASTY ANTERIOR APPROACH;  Surgeon: Mcarthur Rossetti, MD;  Location: [facility];  Service: Orthopedics;  Laterality: Right;  Right total hip replacement, left knee steroid injection   TOTAL KNEE ARTHROPLASTY Left 11/29/2012   Procedure: LEFT TOTAL KNEE ARTHROPLASTY;  Surgeon: Mcarthur Rossetti, MD;  Location: [facility];  Service: Orthopedics;  Laterality: Left;  FEMORAL NERVE BLOCK IN HOLDING AREA LEFT LEG   TOTAL KNEE ARTHROPLASTY Right 11/30/2017   Procedure: RIGHT TOTAL KNEE ARTHROPLASTY;  Surgeon: Mcarthur Rossetti, MD;  Location: [facility];  Service: Orthopedics;  Laterality: Right;   URETHRAL FISTULA REPAIR  ~ [place]  "several times"   VIDEO BRONCHOSCOPY N/A 10/05/2020   Procedure: VIDEO BRONCHOSCOPY WITHOUT FLUORO;  Surgeon: Brand Males, MD;  Location: [facility];  Service: Endoscopy;  Laterality: N/A;    Allergies  Allergen Reactions   Other Other (See Comments)   Spironolactone     Gynecomastia   Sulfa Antibiotics     Thrush and hives    Immunization History  Administered Date(s) Administered   Influenza Split 11/30/2008, 10/26/2011, 10/18/2013, 08/06/2014, 10/11/2014, 10/22/2015, 09/30/2016, 09/28/2018, 09/28/2019   Influenza,inj,Quad PF,6+ Mos 09/30/2016, 10/06/2017, 09/28/2018   Influenza-Unspecified 10/17/2020   PFIZER Comirnaty(Gray Top)Covid-19 Tri-Sucrose Vaccine 05/12/2020   PFIZER(Purple Top)SARS-COV-2 Vaccination 04/30/2019, 05/21/2019, 11/23/2019   Pfizer  Covid-19 Vaccine Bivalent Booster 39yr & up 10/24/2020   Pneumococcal Polysaccharide-23 08/06/2014   Td 12/20/2016   Tdap 11/26/2006   Zoster Recombinat (Shingrix) 10/26/2018, 12/28/2018   Zoster, Live 12/14/2015, 10/26/2018, 12/28/2018    Family History  Problem Relation Age of Onset   Heart disease Father    Heart attack Father 550  Heart failure Brother      Current Outpatient Medications:    atorvastatin (LIPITOR) 20 MG tablet, Take 1 tablet (20 mg total) by mouth daily., Disp: 90 tablet, Rfl: 3   carvedilol (COREG) 25 MG tablet, TAKE 1 TABLET BY MOUTH TWICE A DAY, Disp: 180 tablet, Rfl: 1   dofetilide (TIKOSYN) 250 MCG capsule, TAKE 1 CAPSULE (250 MCG TOTAL) BY MOUTH 2 TIMES DAILY. PLEASE CALL FOR OFFICE VISIT [phone] Disp: 60 capsule, Rfl: 11   ENTRESTO 97-103 MG, TAKE 1 TABLET BY MOUTH TWICE A DAY, Disp: 180 tablet, Rfl: 1   eplerenone (INSPRA) 50 MG tablet, TAKE 1  TABLET BY MOUTH EVERY DAY, Disp: 90 tablet, Rfl: 1   FARXIGA 10 MG TABS tablet, TAKE 1 TABLET BY MOUTH DAILY BEFORE BREAKFAST., Disp: 90 tablet, Rfl: 3   furosemide (LASIX) 40 MG tablet, TAKE 1 TABLET BY MOUTH EVERY DAY, Disp: 90 tablet, Rfl: 3   multivitamin-iron-minerals-folic acid (CENTRUM) chewable tablet, Chew 1 tablet by mouth daily., Disp: , Rfl:    XARELTO 20 MG TABS tablet, TAKE 1 TABLET BY MOUTH DAILY WITH SUPPER, Disp: 30 tablet, Rfl: 5      Objective:   Vitals:   12/06/20 0854  BP: 118/68  Pulse: 64  Temp: 98.1 F (36.7 C)  TempSrc: Oral  SpO2: 99%  Weight: 203 lb 9.6 oz (92.4 kg)  Height: _0  (1.803 m)    Estimated body mass index is 28.4 kg/m as calculated from the following:   Height as of this encounter: _1  (1.803 m).   Weight as of this encounter: 203 lb 9.6 oz (92.4 kg).  _2 @  Filed Weights   12/06/20 0854  Weight: 203 lb 9.6 oz (92.4 kg)     Physical Exam    General: No distress. Looks well Neuro: Alert and Oriented x 3. GCS 15. Speech  normal Psych: Pleasant Resp:  Barrel Chest - no.  Wheeze - no, Crackles - no, No overt respiratory distress CVS: Normal heart sounds. Murmurs - no Ext: Stigmata of Connective Tissue Disease - no HEENT: Normal upper airway. PEERL +. No post nasal drip        Assessment:       ICD-10-CM   1. Pulmonary infiltrates  R91.8     2. Multiple lung nodules on CT  R91.8     3. History of rheumatoid arthritis  Z87.39     4. Personal history of COVID-19  Z86.16          Plan:     Patient Instructions     ICD-10-CM   1. Pulmonary infiltrate present on computed tomography  R91.8     2. History of rheumatoid arthritis  Z87.39     3. Multiple lung nodules on CT  R91.8     4. Personal history of COVID-19  Z86.16       Bronchoscopy august 2022 - negative for organism CT OCt 2022 improved nodules significantly Glad uptodte with vaccines  Plan - avodi respiratory infections with masking and avoiding clustering of humans  Followup  - followup in 1 year - can decide CT chest based on followup    SIGNATURE    Dr. Brand Males, M.D., F.C.C.P,  Pulmonary and Critical Care Medicine Staff Physician, [place] Director - Interstitial Lung Disease  Program  Pulmonary [place] [facility] [place], [place], [address]  Pager: [phone], If no answer or between  15:00h - 7:00h: call [phone] Telephone: [phone]  9:13 AM 12/06/2020

## 2020-12-06 NOTE — Patient Instructions (Signed)
ICD-10-CM   1. Pulmonary infiltrate present on computed tomography  R91.8     2. History of rheumatoid arthritis  Z87.39     3. Multiple lung nodules on CT  R91.8     4. Personal history of COVID-19  Z86.16       Bronchoscopy august 2022 - negative for organism CT OCt 2022 improved nodules significantly Glad uptodte with vaccines  Plan - avodi respiratory infections with masking and avoiding clustering of humans  Followup  - followup in 1 year - can decide CT chest based on followup

## 2020-12-07 ENCOUNTER — Ambulatory Visit (INDEPENDENT_AMBULATORY_CARE_PROVIDER_SITE_OTHER): Payer: Medicare Other

## 2020-12-07 DIAGNOSIS — I420 Dilated cardiomyopathy: Secondary | ICD-10-CM

## 2020-12-07 LAB — CUP PACEART REMOTE DEVICE CHECK
Battery Remaining Longevity: 52 mo
Battery Remaining Percentage: 50 %
Battery Voltage: 2.89 V
Brady Statistic RV Percent Paced: 1.1 %
Date Time Interrogation Session: 20221025020016
HighPow Impedance: 72 Ohm
HighPow Impedance: 72 Ohm
Implantable Lead Implant Date: 20160622
Implantable Lead Location: 753860
Implantable Lead Model: 181
Implantable Lead Serial Number: 331169
Implantable Pulse Generator Implant Date: 20160622
Lead Channel Impedance Value: 380 Ohm
Lead Channel Pacing Threshold Amplitude: 0.75 V
Lead Channel Pacing Threshold Pulse Width: 0.5 ms
Lead Channel Sensing Intrinsic Amplitude: 7.8 mV
Lead Channel Setting Pacing Amplitude: 2.5 V
Lead Channel Setting Pacing Pulse Width: 0.5 ms
Lead Channel Setting Sensing Sensitivity: 0.5 mV
Pulse Gen Serial Number: 7280026

## 2020-12-10 ENCOUNTER — Encounter (HOSPITAL_COMMUNITY): Payer: Self-pay

## 2020-12-10 ENCOUNTER — Other Ambulatory Visit (HOSPITAL_COMMUNITY): Payer: Self-pay | Admitting: *Deleted

## 2020-12-10 MED ORDER — DOFETILIDE 250 MCG PO CAPS: ORAL_CAPSULE | ORAL | 11 refills | Status: DC

## 2020-12-16 ENCOUNTER — Other Ambulatory Visit: Payer: Self-pay | Admitting: *Deleted

## 2020-12-16 MED ORDER — ATORVASTATIN CALCIUM 20 MG PO TABS: 20.0000 mg | ORAL_TABLET | Freq: Every day | ORAL | 3 refills | Status: DC

## 2020-12-16 NOTE — Progress Notes (Signed)
Remote ICD transmission.   

## 2020-12-28 ENCOUNTER — Telehealth: Payer: Self-pay | Admitting: Internal Medicine

## 2021-01-04 NOTE — Telephone Encounter (Signed)
Going to route this to [place] to see if she might be able to help [place] out with this. Please advise.

## 2021-01-05 DIAGNOSIS — I10 Essential (primary) hypertension: Secondary | ICD-10-CM | POA: Diagnosis not present

## 2021-01-05 DIAGNOSIS — Z23 Encounter for immunization: Secondary | ICD-10-CM | POA: Diagnosis not present

## 2021-01-05 DIAGNOSIS — I428 Other cardiomyopathies: Secondary | ICD-10-CM | POA: Diagnosis not present

## 2021-01-05 DIAGNOSIS — Z0000 Encounter for general adult medical examination without abnormal findings: Secondary | ICD-10-CM | POA: Diagnosis not present

## 2021-01-05 DIAGNOSIS — I5020 Unspecified systolic (congestive) heart failure: Secondary | ICD-10-CM | POA: Diagnosis not present

## 2021-01-05 DIAGNOSIS — I700 Atherosclerosis of aorta: Secondary | ICD-10-CM | POA: Diagnosis not present

## 2021-01-05 NOTE — Telephone Encounter (Signed)
Spoke with Marisue Ivan who states visit was billed correctly with the correct modifiers to indicate the visit was a telemed visit. I called [employer] and they said Tammy was out of network and his copay for out of network is $50 instead of in network copay of $30. I have sent an email to the provider credentialing department of [facility] to get clarification why Tammy would be out of network when everyone else is in network within our practice. Contacted the patient to update him on what was being done- patient verbalized understanding and was thankful for the follow up.

## 2021-01-10 ENCOUNTER — Other Ambulatory Visit (HOSPITAL_COMMUNITY): Payer: Self-pay | Admitting: Cardiology

## 2021-01-10 DIAGNOSIS — H109 Unspecified conjunctivitis: Secondary | ICD-10-CM | POA: Diagnosis not present

## 2021-01-11 NOTE — Telephone Encounter (Signed)
Forwarded message to [place] via email. Per Budd Palmer, "Terry [name] is in network (all of our providers are).  They are all credentialed the same way with our payers. I have forwarded this to our Presenter, broadcasting to resolve."

## 2021-01-19 ENCOUNTER — Ambulatory Visit (HOSPITAL_COMMUNITY)
Admission: RE | Admit: 2021-01-19 | Discharge: 2021-01-19 | Disposition: A | Discharge status: Home / Self Care | Payer: Medicare Other | Source: Ambulatory Visit | Attending: Cardiology | Admitting: Cardiology

## 2021-01-19 ENCOUNTER — Ambulatory Visit (HOSPITAL_BASED_OUTPATIENT_CLINIC_OR_DEPARTMENT_OTHER)
Admission: RE | Admit: 2021-01-19 | Discharge: 2021-01-19 | Disposition: A | Discharge status: Home / Self Care | Payer: Medicare Other | Source: Ambulatory Visit | Attending: Cardiology | Admitting: Cardiology

## 2021-01-19 ENCOUNTER — Encounter (HOSPITAL_COMMUNITY): Payer: Self-pay | Admitting: Cardiology

## 2021-01-19 ENCOUNTER — Other Ambulatory Visit: Payer: Self-pay

## 2021-01-19 ENCOUNTER — Encounter: Payer: Self-pay | Admitting: Cardiology

## 2021-01-19 VITALS — BP 90/60 | HR 65 | Wt 205.2 lb

## 2021-01-19 DIAGNOSIS — I080 Rheumatic disorders of both mitral and aortic valves: Secondary | ICD-10-CM | POA: Insufficient documentation

## 2021-01-19 DIAGNOSIS — Z79899 Other long term (current) drug therapy: Secondary | ICD-10-CM | POA: Insufficient documentation

## 2021-01-19 DIAGNOSIS — I480 Paroxysmal atrial fibrillation: Secondary | ICD-10-CM

## 2021-01-19 DIAGNOSIS — Z7901 Long term (current) use of anticoagulants: Secondary | ICD-10-CM | POA: Diagnosis not present

## 2021-01-19 DIAGNOSIS — Z95810 Presence of automatic (implantable) cardiac defibrillator: Secondary | ICD-10-CM | POA: Diagnosis not present

## 2021-01-19 DIAGNOSIS — I5022 Chronic systolic (congestive) heart failure: Secondary | ICD-10-CM

## 2021-01-19 DIAGNOSIS — I130 Hypertensive heart and chronic kidney disease with heart failure and stage 1 through stage 4 chronic kidney disease, or unspecified chronic kidney disease: Secondary | ICD-10-CM | POA: Insufficient documentation

## 2021-01-19 DIAGNOSIS — N183 Chronic kidney disease, stage 3 unspecified: Secondary | ICD-10-CM | POA: Insufficient documentation

## 2021-01-19 DIAGNOSIS — I493 Ventricular premature depolarization: Secondary | ICD-10-CM | POA: Diagnosis not present

## 2021-01-19 DIAGNOSIS — I420 Dilated cardiomyopathy: Secondary | ICD-10-CM | POA: Diagnosis not present

## 2021-01-19 LAB — LIPID PANEL
Cholesterol: 109 mg/dL (ref 0–200)
HDL: 32 mg/dL — ABNORMAL LOW (ref >40)
LDL Cholesterol: 60 mg/dL (ref 0–99)
Total CHOL/HDL Ratio: 3.4 RATIO
Triglycerides: 84 mg/dL (ref <150)
VLDL: 17 mg/dL (ref 0–40)

## 2021-01-19 LAB — BASIC METABOLIC PANEL
Anion gap: 12 (ref 5–15)
BUN: 24 mg/dL — ABNORMAL HIGH (ref 8–23)
CO2: 25 mmol/L (ref 22–32)
Calcium: 9.4 mg/dL (ref 8.9–10.3)
Chloride: 99 mmol/L (ref 98–111)
Creatinine, Ser: 1.53 mg/dL — ABNORMAL HIGH (ref 0.61–1.24)
GFR, Estimated: 50 mL/min — ABNORMAL LOW (ref >60)
Glucose, Bld: 96 mg/dL (ref 70–99)
Potassium: 4.9 mmol/L (ref 3.5–5.1)
Sodium: 136 mmol/L (ref 135–145)

## 2021-01-19 LAB — CBC
HCT: 35.8 % — ABNORMAL LOW (ref 39.0–52.0)
Hemoglobin: 11.9 g/dL — ABNORMAL LOW (ref 13.0–17.0)
MCH: 28.8 pg (ref 26.0–34.0)
MCHC: 33.2 g/dL (ref 30.0–36.0)
MCV: 86.7 fL (ref 80.0–100.0)
Platelets: 230 10*3/uL (ref 150–400)
RBC: 4.13 MIL/uL — ABNORMAL LOW (ref 4.22–5.81)
RDW: 13.4 % (ref 11.5–15.5)
WBC: 9 10*3/uL (ref 4.0–10.5)
nRBC: 0 % (ref 0.0–0.2)

## 2021-01-19 LAB — ECHOCARDIOGRAM COMPLETE
Area-P 1/2: 3.37 cm2
P 1/2 time: 878 msec
S' Lateral: 4.4 cm

## 2021-01-19 LAB — MAGNESIUM: Magnesium: 2.4 mg/dL (ref 1.7–2.4)

## 2021-01-19 NOTE — Patient Instructions (Signed)
It was great to see you today! No medication changes are needed at this time.  Labs today We will only contact you if something comes back abnormal or we need to make some changes. Otherwise no news is good news!  Your physician recommends that you schedule a follow-up appointment in: 6 months with Dr McLean  Do the following things EVERYDAY: Weigh yourself in the morning before breakfast. Write it down and keep it in a log. Take your medicines as prescribed Eat low salt foods--Limit salt (sodium) to 2000 mg per day.  Stay as active as you can everyday Limit all fluids for the day to less than 2 liters  At the [facility], you and your health needs are our priority. As part of our continuing mission to provide you with exceptional heart care, we have created designated Provider Care Teams. These Care Teams include your primary Cardiologist (physician) and Advanced Practice Providers (APPs- Physician Assistants and Nurse Practitioners) who all work together to provide you with the care you need, when you need it.   You may see any of the following providers on your designated Care Team at your next follow up: Dr Daniel Bensimhon Dr Dalton McLean Amy Clegg, NP Brittainy Simmons, PA Jessica Milford,NP Lindsay Finch, PA Lauren Kemp, PharmD   Please be sure to bring in all your medications bottles to every appointment.    

## 2021-01-19 NOTE — Progress Notes (Signed)
Patient ID: Terry [name], male   DOB: 12-18-1955, 65 y.o.   MRN: [id number] Primary Care: [name], MD Primary Cardiologist: Armanda Magic, MD HF Cardiologist: Dr [name]  HPI: Terry [name] is a 65 y.o. 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 Back in 2006, an echo showed EF 15-20%.  LHC at that time showed normal coronaries.  He has been managed for cardiomyopathy since that time. In 12/14, EF had improved to 50%.  However, he has had a decline since then.  Last echo in 5/16 showed EF 25%.  Atrial fibrillation was first noted in 2013.  In 7/15, he had DCCV to NSR.  In 1/16, he was noted to be back in atrial fibrillation and appears to have been in atrial fibrillation since that time. He had St Jude ICD placed by Dr [name] in 6/16.  Given fall in EF, Lexiscan Cardiolite was done. This showed EF 39% with partially reversible inferior and apical perfusion defect. He had LHC in 7/16 showing no significant CAD.  In 8/16, he was admitted for Tikosyn initiation and converted to NSR.  Echo in 8/18 showed EF 35-40%, mild to moderately decreased RV systolic function.  Echo in 8/19 showed EF 40-45%, mild-moderate MR, mild to moderately decreased RV systolic function.   Holter in 8/19 showed 8% PVCs.   He had right TKR in 10/19 without complications.   Echo in 10/20 showed EF stable at 40-45%. Echo in 12/21 showed EF 35-40%, moderate RV enlargement with normal systolic function, 4.2 cm ascending aorta.   Echo was done today and reviewed, EF 40% with mild LV dilation, mild RV dilation, normal RV systolic function, mild-moderate MR.   He presents today for followup of CHF.  He is doing quite well symptomatically.  No problems with yardwork like leaf-blowing.  He can walk 6-7 miles/day with no problems.  No significant exertional dyspnea.  No chest pain.  He has been traveling in [place].    ECG (personally reviewed):  NSR, PVCs, QTc 469 msec  HIV negative, ferritin normal, immunofixation with no monoclonal protein.  Labs (6/16): K 4.2, Creatinine 1.08, HCT 37.5 Labs (08/18/14): K 4.2, Creatinine 1.0 Labs (8/16): K 4.7, creatinine 1.18 Labs (9/16): Mg 2.1, creatinine 1.16, K 4.6 Labs (6/18): K 4.4, creatinine 1.25 Labs (9/18): K 4.4, creatinine 1.24 Labs (12/18): K 4.1, creatinine 1.2, hgb 11.6 Labs (10/19): K 4.3, creatinine 1.0 Labs (3/21): K 4.7, creatinine 1.52 Labs (9/21): K 4.6, creaetinine 1.57 Labs (12/21): K 4.4, creatinine 1.75 Labs (2/22): LDL 58 Labs (7/22): K 4.5, creatinine 1.55  PMH 1. Nonischemic dilated cardiomyopathy: Echo (2006) with EF 15-20%.  LHC in 10/06 showed normal coronaries.  By 2014, EF had improved to 50%.  Echo in 1/16 showed EF 35-40%.  Echo (5/16) showed EF 25% with mild MR.  St Jude ICD 6/16.  HIV negative, immunofixation with no monoclonal protein, and ferritin normal.  Lexiscan Cardiolite (7/16) with EF 39% and partially reversible inferior and apical perfusion defect (intermediate risk).   LHC (7/16) with EF 35-40%, no significant CAD.  - Echo (8/17): EF 45-50% - Echo (8/18): EF 35-40%, moderate diastoilc dysfunction, mild AI, mild MR, mild to moderately decreased RV systolic function.   - Echo (8/19): EF 40-45%, mild-moderate MR, mild to moderately decreased RV systolic function.  - Echo (10/20): EF 40-45%, mild LVH, normal RV, mild MR.  - Echo (12/21): EF 35-40%,  moderate RV enlargement with normal systolic function, 4.2 cm ascending aorta.  - Echo (12/22): EF 40% with mild LV dilation, mild RV dilation, normal RV systolic function, mild-moderate MR. 2. Atrial fibrillation: Paroxysmal.  First noted in 2013.  DCCV in 7/15.  Back in atrial fibrillation 1/16.  Converted back to NSR with Tikosyn in 8/16.  3. HTN 4. Rheumatoid Arthritis: Has been off all medications for several years.   5. H/o bilateral THR 6. Gynecomastia with spironolactone.  7. PVCs: Holter in 8/19  showed 8% PVCs.  8. Ascending aortic aneurysm: 4.2 cm ascending aorta on 12/21 echo.  - CT chest (7/22) with 4.1 cm ascending aorta  SH: Lives at home with wife and 2 children and a grandchild. Never smoker, drinks 1 or 2 beers a week with dinner. 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   FH: Father with MI at 64, brother with atrial fibrillation, mother with renal failure.   Review of systems complete and found to be negative unless listed in HPI.    Current Outpatient Medications  Medication Sig Dispense Refill   atorvastatin (LIPITOR) 20 MG tablet Take 1 tablet (20 mg total) by mouth daily. 90 tablet 3   carvedilol (COREG) 25 MG tablet TAKE 1 TABLET BY MOUTH TWICE A DAY 180 tablet 1   dofetilide (TIKOSYN) 250 MCG capsule TAKE 1 CAPSULE (250 MCG TOTAL) BY MOUTH 2 TIMES DAILY. PLEASE CALL FOR OFFICE VISIT [phone] 60 capsule 11   ENTRESTO 97-103 MG TAKE 1 TABLET BY MOUTH TWICE A DAY 180 tablet 1   eplerenone (INSPRA) 50 MG tablet TAKE 1 TABLET BY MOUTH EVERY DAY 90 tablet 1   FARXIGA 10 MG TABS tablet TAKE 1 TABLET BY MOUTH DAILY BEFORE BREAKFAST. 90 tablet 3   furosemide (LASIX) 40 MG tablet TAKE 1 TABLET BY MOUTH EVERY DAY 90 tablet 3   multivitamin-iron-minerals-folic acid (CENTRUM) chewable tablet Chew 1 tablet by mouth daily.     XARELTO 20 MG TABS tablet TAKE 1 TABLET BY MOUTH DAILY WITH SUPPER 30 tablet 5   No current facility-administered medications for this encounter.   Allergies  Allergen Reactions   Other Other (See Comments)   Spironolactone     Gynecomastia   Sulfa Antibiotics     Thrush and hives   Vitals:   01/19/21 1108  BP: 90/60  Pulse: 65  SpO2: 99%  Weight: 93.1 kg (205 lb 3.2 oz)    Wt Readings from Last 3 Encounters:  01/19/21 93.1 kg (205 lb 3.2 oz)  12/06/20 92.4 kg (203 lb 9.6 oz)  11/05/20 93.9 kg (207 lb)    PHYSICAL EXAM: General: NAD Neck: No JVD, no thyromegaly or thyroid nodule.  Lungs: Clear to auscultation  bilaterally with normal respiratory effort. CV: Nondisplaced PMI.  Heart regular S1/S2, no S3/S4, no murmur.  No peripheral edema.  No carotid bruit.  Normal pedal pulses.  Abdomen: Soft, nontender, no hepatosplenomegaly, no distention.  Skin: Intact without lesions or rashes.  Neurologic: Alert and oriented x 3.  Psych: Normal affect. Extremities: No clubbing or cyanosis.  HEENT: Normal.   ASSESSMENT & PLAN: 1. Chronic systolic HF: Nonischemic cardiomyopathy x years.  He has a Research officer, political party ICD and is not a candidate for CRT due to narrow QRS.  7/16 LHC showed no significant CAD.  Most recent echo in 12/22 with EF 40%, stable (reviewed today).  NYHA class I-II symptoms, not volume overloaded by exam.  - Continue dapagliflozin 10 mg daily.    -  Continue Coreg 25 mg BID - Continue Entresto 97/103 bid.   - Gynecomastia resolved off spironolactone.  Continue eplerenone 50 mg daily. BMET today.  2.  Atrial fibrillation: Paroxysmal.  Holding NSR on Tikosyn.  QTc ok on ECG today.  - BMET and Mg today.  - Continue Xarelto 20 mg daily. CBC today.  3. PVCs: Holter in 8/19 showed 8% PVCs.  This is unlikely to affect his cardiomyopathy.  Still with occasional PVCs, denies palpitations.  4. CKD: Stage 3.   - Continue dapagliflozin.  5. Dilated ascending aorta: 4.1 cm on CT chest in 7/22. Reassess in 1 year.   Followup in 6 months   Loralie Champagne, MD  01/19/2021

## 2021-01-24 ENCOUNTER — Other Ambulatory Visit: Payer: Self-pay | Admitting: Cardiology

## 2021-01-24 DIAGNOSIS — H5212 Myopia, left eye: Secondary | ICD-10-CM | POA: Diagnosis not present

## 2021-01-24 DIAGNOSIS — I700 Atherosclerosis of aorta: Secondary | ICD-10-CM

## 2021-01-24 DIAGNOSIS — H02831 Dermatochalasis of right upper eyelid: Secondary | ICD-10-CM | POA: Diagnosis not present

## 2021-01-24 DIAGNOSIS — H02834 Dermatochalasis of left upper eyelid: Secondary | ICD-10-CM | POA: Diagnosis not present

## 2021-01-24 DIAGNOSIS — I7121 Aneurysm of the ascending aorta, without rupture: Secondary | ICD-10-CM

## 2021-01-24 DIAGNOSIS — I77810 Thoracic aortic ectasia: Secondary | ICD-10-CM

## 2021-01-26 NOTE — Telephone Encounter (Signed)
Spoke with Fleet Contras at billing- states patient made the $50 copayment on 01/20/2021. Left voicemail for patient to call back to discuss. Sending a message to [place] for an update.

## 2021-01-28 ENCOUNTER — Encounter: Payer: Self-pay | Admitting: Internal Medicine

## 2021-01-28 NOTE — Telephone Encounter (Signed)
Left voicemail for patient to call back to discuss billing.

## 2021-01-28 NOTE — Telephone Encounter (Signed)
Spoke with patient- informed him that the [employer] team did an investigation and they found [employer] had incorrectly dropped TP from the network.  They have notified [employer] to correct and refile all claims. TP is in network, but it may be a few weeks before [facility] can correct.

## 2021-02-16 ENCOUNTER — Other Ambulatory Visit: Payer: Self-pay | Admitting: Cardiology

## 2021-02-21 DIAGNOSIS — S0502XA Injury of conjunctiva and corneal abrasion without foreign body, left eye, initial encounter: Secondary | ICD-10-CM | POA: Diagnosis not present

## 2021-02-23 DIAGNOSIS — S0502XD Injury of conjunctiva and corneal abrasion without foreign body, left eye, subsequent encounter: Secondary | ICD-10-CM | POA: Diagnosis not present

## 2021-03-02 DIAGNOSIS — S0502XD Injury of conjunctiva and corneal abrasion without foreign body, left eye, subsequent encounter: Secondary | ICD-10-CM | POA: Diagnosis not present

## 2021-03-08 ENCOUNTER — Ambulatory Visit (INDEPENDENT_AMBULATORY_CARE_PROVIDER_SITE_OTHER): Payer: Medicare Other

## 2021-03-08 DIAGNOSIS — I420 Dilated cardiomyopathy: Secondary | ICD-10-CM | POA: Diagnosis not present

## 2021-03-08 LAB — CUP PACEART REMOTE DEVICE CHECK
Battery Remaining Longevity: 47 mo
Battery Remaining Percentage: 46 %
Battery Voltage: 2.87 V
Brady Statistic RV Percent Paced: 1.2 %
Date Time Interrogation Session: 20230124020016
HighPow Impedance: 66 Ohm
HighPow Impedance: 66 Ohm
Implantable Lead Implant Date: 20160622
Implantable Lead Location: 753860
Implantable Lead Model: 181
Implantable Lead Serial Number: 331169
Implantable Pulse Generator Implant Date: 20160622
Lead Channel Impedance Value: 380 Ohm
Lead Channel Pacing Threshold Amplitude: 0.75 V
Lead Channel Pacing Threshold Pulse Width: 0.5 ms
Lead Channel Sensing Intrinsic Amplitude: 7.4 mV
Lead Channel Setting Pacing Amplitude: 2.5 V
Lead Channel Setting Pacing Pulse Width: 0.5 ms
Lead Channel Setting Sensing Sensitivity: 0.5 mV
Pulse Gen Serial Number: 7280026

## 2021-03-18 NOTE — Progress Notes (Signed)
Remote ICD transmission.   

## 2021-04-06 ENCOUNTER — Encounter: Payer: Self-pay | Admitting: Cardiology

## 2021-04-06 MED ORDER — CARVEDILOL 25 MG PO TABS: 25.0000 mg | ORAL_TABLET | Freq: Two times a day (BID) | ORAL | 3 refills | Status: DC

## 2021-04-18 ENCOUNTER — Other Ambulatory Visit: Payer: Self-pay | Admitting: Cardiology

## 2021-04-18 NOTE — Telephone Encounter (Signed)
Prescription refill request for Xarelto received.  ? ?Indication: afib  ?Last office visit: 01/19/2021, [place]  ?Weight: 93.1 kg  ?Age: 66 yo  ?Scr: 1.53, 01/19/2021 ?CrCl: 63 ml/min  ? ?Refill sent.  ?

## 2021-04-25 ENCOUNTER — Other Ambulatory Visit: Payer: Self-pay

## 2021-04-25 NOTE — Progress Notes (Unsigned)
Date:  04/25/2021   ID:  Terry [name], DOB Mar 14, 1955, MRN [id number]   PCP:  Shirline Frees, MD  Cardiologist:  Fransico Him, MD Electrophysiologist: Cristopher Peru, MD  Chief Complaint:  CHF, HTN, PAF, DCM  History of Present Illness:    Terry [name] is a 66 y.o. 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 Back in 2006, an echo showed EF 15-20%.  LHC at that time showed normal coronaries.  He has been managed for cardiomyopathy since that time. In 12/14, EF had improved to 50%.  However, he then had a decline with echo in 5/16 showing EF 25%.  Atrial fibrillation was first noted in 2013.  In 7/15, he had DCCV to NSR.  In 1/16, he was noted to be back in atrial fibrillation. He had St Jude ICD placed by Dr Lovena Le in 6/16.  Given fall in EF, Lexiscan Cardiolite was done. This showed EF 39% with partially reversible inferior and apical perfusion defect. He had LHC in 7/16 showing no significant CAD.  In 8/16, he was admitted for Tikosyn initiation and converted to NSR.  Echo in 8/18 showed EF 35-40%, mild to moderately decreased RV systolic function.     Echo in 8/19 showed EF 40-45%, mild-moderate MR, mild to moderately decreased RV systolic function, mild to moderate MR and dilated aortic root at 3mm and ascending aorta 45mm.  Holter showed 8% PVCs by tele monitor.  He is followed by EP for his ICD. Workup for DCM included HIV (neg), ferritin (normal) and no monoclonal Ab on SPEP/UPEP.   He is followed as well by AHF service.    He is here today for followup and is doing well.  He denies any chest pain or pressure, SOB, DOE, PND, orthopnea, LE edema, dizziness, palpitations or syncope. He is compliant with his meds and is tolerating meds with no SE.     Prior CV studies:   The following studies were reviewed today:  2D echo 01/2020 IMPRESSIONS    1. Left ventricular ejection fraction, by estimation, is 35  to 40%. The  left ventricle has moderately decreased function. The left ventricle  demonstrates global hypokinesis. Left ventricular diastolic parameters are  consistent with Grade I diastolic  dysfunction (impaired relaxation).   2. Right ventricular systolic function is normal. The right ventricular  size is moderately enlarged. There is normal pulmonary artery systolic  pressure. The estimated right ventricular systolic pressure is [id number] mmHg.   3. Left atrial size was severely dilated.   4. Right atrial size was moderately dilated.   5. The mitral valve is normal in structure. Mild mitral valve  regurgitation. No evidence of mitral stenosis.   6. The aortic valve is tricuspid. Aortic valve regurgitation is mild. No  aortic stenosis is present.   7. Aortic dilatation noted. There is mild dilatation of the ascending  aorta, measuring 42 mm.   8. The inferior vena cava is normal in size with greater than 50%  respiratory variability, suggesting right atrial pressure of 3 mmHg.   Past Medical History:  Diagnosis Date   AICD (automatic cardioverter/defibrillator) present    Anemia    "S/P knee & hip OR"   Aortic atherosclerosis (HCC)    Aortic insufficiency    mild    Ascending aorta dilation (HCC)    3mm by echo and CT [id number]   Chronic systolic CHF (congestive heart  failure), NYHA class 1 (HCC)    Complication of anesthesia    hx of irregular beat after anesthesia in ~ 1990's   GERD (gastroesophageal reflux disease)    H/O hematuria    H/O hiatal hernia    Hepatitis 1960's   "caught it from my brother"   History of blood transfusion 2013; 2014   S/P knee and hip OR   Hypertension    Nonischemic dilated cardiomyopathy ([place])    EF 40-45% by echo 2019.  S/P AICD for primary prevention.  Repeat EF 01/2020 35-40%.    PAF (paroxysmal atrial fibrillation) ([place])    "off and on since 2013" (08/05/2014)   PVC (premature ventricular contraction)    PVC load 8%   Rheumatoid  arthritis ([place]) dx'd 1995   Past Surgical History:  Procedure Laterality Date   BRONCHIAL WASHINGS  10/05/2020   Procedure: BRONCHIAL WASHINGS;  Surgeon: Brand Males, MD;  Location: [facility];  Service: Endoscopy;;   CARDIAC CATHETERIZATION  ~ 2004   normal   CARDIAC CATHETERIZATION N/A 09/07/2014   Procedure: Left Heart Cath and Coronary Angiography;  Surgeon: Larey Dresser, MD;  Location: [place] [facility];  Service: Cardiovascular;  Laterality: N/A;   CARDIAC DEFIBRILLATOR PLACEMENT  08/05/2014   St. 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    JOINT REPLACEMENT     NASAL SEPTUM SURGERY  ~ [place]  09/08/2011   Procedure: TOTAL HIP ARTHROPLASTY ANTERIOR APPROACH;  Surgeon: Mcarthur Rossetti, MD;  Location: [facility];  Service: Orthopedics;  Laterality: Right;  Right total hip replacement, left knee steroid injection   TOTAL KNEE ARTHROPLASTY Left 11/29/2012   Procedure: LEFT TOTAL KNEE ARTHROPLASTY;  Surgeon: Mcarthur Rossetti, MD;  Location: [facility];  Service: Orthopedics;  Laterality: Left;  FEMORAL NERVE BLOCK IN HOLDING AREA LEFT LEG   TOTAL KNEE ARTHROPLASTY Right 11/30/2017   Procedure: RIGHT TOTAL KNEE ARTHROPLASTY;  Surgeon: Mcarthur Rossetti, MD;  Location: [facility];  Service: Orthopedics;  Laterality: Right;   URETHRAL FISTULA REPAIR  ~ [place]  "several times"   VIDEO BRONCHOSCOPY N/A 10/05/2020   Procedure: VIDEO BRONCHOSCOPY WITHOUT FLUORO;  Surgeon: Brand Males, MD;  Location: [facility];   Service: Endoscopy;  Laterality: N/A;     No outpatient medications have been marked as taking for the 04/26/21 encounter (Appointment) with Sueanne Margarita, MD.     Allergies:   Other, Spironolactone, and Sulfa antibiotics   Social History   Tobacco Use   Smoking status: Never   Smokeless tobacco: Never  Vaping Use   Vaping Use: Never used  Substance Use Topics   Alcohol use: Yes    Alcohol/week: 1.0 standard drink    Types: 1 Cans of beer per week   Drug use: No     Family Hx: The patient's family history includes Heart attack (age of onset: 30) in his father; Heart disease in his father; Heart failure in his brother.  ROS:   Please see  the history of present illness.     All other systems reviewed and are negative.   Labs/Other Tests and Data Reviewed:    Recent Labs: 01/19/2021: BUN 24; Creatinine, Ser 1.53; Hemoglobin 11.9; Magnesium 2.4; Platelets 230; Potassium 4.9; Sodium 136   Recent Lipid Panel Lab Results  Component Value Date/Time   CHOL 109 01/19/2021 11:42 AM   CHOL 111 03/22/2020 08:45 AM   TRIG 84 01/19/2021 11:42 AM   HDL 32 (L) 01/19/2021 11:42 AM   HDL 32 (L) 03/22/2020 08:45 AM   CHOLHDL 3.4 01/19/2021 11:42 AM   LDLCALC 60 01/19/2021 11:42 AM   LDLCALC 58 03/22/2020 08:45 AM    Wt Readings from Last 3 Encounters:  01/19/21 205 lb 3.2 oz (93.1 kg)  12/06/20 203 lb 9.6 oz (92.4 kg)  11/05/20 207 lb (93.9 kg)     Objective:    Vital Signs:  There were no vitals taken for this visit.   GEN: Well nourished, well developed in no acute distress HEENT: Normal NECK: No JVD; No carotid bruits LYMPHATICS: No lymphadenopathy CARDIAC:RRR, no murmurs, rubs, gallops RESPIRATORY:  Clear to auscultation without rales, wheezing or rhonchi  ABDOMEN: Soft, non-tender, non-distended MUSCULOSKELETAL:  No edema; No deformity  SKIN: Warm and dry NEUROLOGIC:  Alert and oriented x 3 PSYCHIATRIC:  Normal affect   ASSESSMENT & PLAN:    1.  Chronic  systolic CHF -  -He has stable NHYA Class I symptoms.   -He remains very active working out in his yard, walking 13mi daily -He did not tolerate spiro due to gynecomastia and is now on Eplerenone 50mg  daily.   -he appears euvolemic on exam today -continue prescription drug management with Carvedilol 25mg  BID, Lasix 40mg  daily, Entresto 97-103mg  BID, Eplerenone 50mg  daily and Farxiga 10mg  daily with PRN refills.  -I have personally reviewed and interpreted outside labs performed by patient's PCP which showed ***  -EF 35-40% by echo 01/2020 on max guideline directed HF meds and increased to 40% on echo 01/2021   2.  HTN  -BP is controlled on exam today -continue prescription drug management with Carvedilol 25mg  BID, Entresto 97-182mmg BID, Inspra 50mg  daily with PRN refills   3.  Paroxysmal atrial fibrillation  -he continues to maintain NSR on exam today -he denies any palpitations and no bleeding problems on DOAC -Continue prescription drug management with Carvedilol 25mg  BID and Tikosyn 224mcg BID with PRN refills -He is on Xarelto 20mg  daily for a CHADS2VASC score of 3 (HTN, CHF and atherosclerosis of the aorta) -I have personally reviewed and interpreted outside labs performed by patient's PCP which showed ***    4.  PVCs  - Holter monitor showed 8% PVC load and therefore likely not the etiology of his DCM. -continue BB -he denies any palpitations   5.  Nonischemic DCM  -Workup for secondary causes were negative (HIV/monoclonal Ab/normal ferritin).  -Cardiac cath 08/2014 with no CAD. -S/P AICD for primary prevention followed in device clinic by Dr. Lovena Le - no events on recent ICD interrogation -Not a candidate for CRT due to narrow QRS.  6.  Dilated ascending aorta -mildly dilated on echo 01/2020 at 84mm and 72mm by echo 01/2021 -repeat echo 01/2022  7.  HLD -LDL goal < 70  -I have personally reviewed and interpreted outside labs performed by patient's PCP which showed LDL 60,  HDL 32, TAGS 84 on 01/19/2021  -continue prescription drug management with Atorvastatin 20mg  daily with PRN refills  8.  RA -he  has pulmonary nodules followed by Pulmonary -PASP was 71mmHg on echo 01/2021  Medication Adjustments/Labs and Tests Ordered: Current medicines are reviewed at length with the patient today.  Concerns regarding medicines are outlined above.  Tests Ordered: No orders of the defined types were placed in this encounter.  Medication Changes: No orders of the defined types were placed in this encounter.   Disposition:  Follow up in 6 month(s) with me  Signed, Fransico Him, MD  04/25/2021 5:30 PM    [place]

## 2021-04-25 NOTE — Progress Notes (Signed)
Opened in error

## 2021-04-26 ENCOUNTER — Encounter: Payer: Self-pay | Admitting: Cardiology

## 2021-04-26 ENCOUNTER — Ambulatory Visit: Payer: Medicare Other | Admitting: Cardiology

## 2021-04-26 ENCOUNTER — Other Ambulatory Visit: Payer: Self-pay

## 2021-04-26 VITALS — BP 118/66 | HR 66 | Ht 71.0 in | Wt 209.8 lb

## 2021-04-26 DIAGNOSIS — E7800 Pure hypercholesterolemia, unspecified: Secondary | ICD-10-CM | POA: Diagnosis not present

## 2021-04-26 DIAGNOSIS — I5022 Chronic systolic (congestive) heart failure: Secondary | ICD-10-CM

## 2021-04-26 DIAGNOSIS — I493 Ventricular premature depolarization: Secondary | ICD-10-CM

## 2021-04-26 DIAGNOSIS — I10 Essential (primary) hypertension: Secondary | ICD-10-CM | POA: Diagnosis not present

## 2021-04-26 DIAGNOSIS — I77810 Thoracic aortic ectasia: Secondary | ICD-10-CM

## 2021-04-26 DIAGNOSIS — I420 Dilated cardiomyopathy: Secondary | ICD-10-CM

## 2021-04-26 DIAGNOSIS — M069 Rheumatoid arthritis, unspecified: Secondary | ICD-10-CM | POA: Diagnosis not present

## 2021-04-26 DIAGNOSIS — I480 Paroxysmal atrial fibrillation: Secondary | ICD-10-CM

## 2021-04-26 MED ORDER — DOFETILIDE 250 MCG PO CAPS: ORAL_CAPSULE | ORAL | 11 refills | Status: DC

## 2021-04-26 MED ORDER — CARVEDILOL 25 MG PO TABS: 25.0000 mg | ORAL_TABLET | Freq: Two times a day (BID) | ORAL | 3 refills | Status: DC

## 2021-04-26 MED ORDER — DAPAGLIFLOZIN PROPANEDIOL 10 MG PO TABS: 10.0000 mg | ORAL_TABLET | Freq: Every day | ORAL | 3 refills | Status: DC

## 2021-04-26 MED ORDER — ENTRESTO 97-103 MG PO TABS: 1.0000 | ORAL_TABLET | Freq: Two times a day (BID) | ORAL | 3 refills | Status: DC

## 2021-04-26 MED ORDER — ATORVASTATIN CALCIUM 20 MG PO TABS: 20.0000 mg | ORAL_TABLET | Freq: Every day | ORAL | 3 refills | Status: DC

## 2021-04-26 MED ORDER — FUROSEMIDE 40 MG PO TABS: 40.0000 mg | ORAL_TABLET | Freq: Every day | ORAL | 3 refills | Status: DC

## 2021-04-26 MED ORDER — RIVAROXABAN 20 MG PO TABS: 20.0000 mg | ORAL_TABLET | Freq: Every day | ORAL | 3 refills | Status: DC

## 2021-04-26 NOTE — Patient Instructions (Signed)

## 2021-04-26 NOTE — Addendum Note (Signed)
Addended by: Antonieta Iba on: 04/26/2021 08:47 AM ? ? Modules accepted: Orders ? ?

## 2021-06-07 ENCOUNTER — Ambulatory Visit (INDEPENDENT_AMBULATORY_CARE_PROVIDER_SITE_OTHER): Payer: Medicare Other

## 2021-06-07 DIAGNOSIS — I420 Dilated cardiomyopathy: Secondary | ICD-10-CM | POA: Diagnosis not present

## 2021-06-07 LAB — CUP PACEART REMOTE DEVICE CHECK
Battery Remaining Longevity: 43 mo
Battery Remaining Percentage: 42 %
Battery Voltage: 2.86 V
Brady Statistic RV Percent Paced: 1.1 %
Date Time Interrogation Session: 20230425020018
HighPow Impedance: 74 Ohm
HighPow Impedance: 74 Ohm
Implantable Lead Implant Date: 20160622
Implantable Lead Location: 753860
Implantable Lead Model: 181
Implantable Lead Serial Number: 331169
Implantable Pulse Generator Implant Date: 20160622
Lead Channel Impedance Value: 380 Ohm
Lead Channel Pacing Threshold Amplitude: 0.75 V
Lead Channel Pacing Threshold Pulse Width: 0.5 ms
Lead Channel Sensing Intrinsic Amplitude: 6.8 mV
Lead Channel Setting Pacing Amplitude: 2.5 V
Lead Channel Setting Pacing Pulse Width: 0.5 ms
Lead Channel Setting Sensing Sensitivity: 0.5 mV
Pulse Gen Serial Number: 7280026

## 2021-06-23 NOTE — Progress Notes (Signed)
Remote ICD transmission.   

## 2021-07-25 IMAGING — CT CT CHEST W/O CM
2 of 4 series · 14 of 36 positions shown, 17 images · non-contrast
Comparison: None.

CLINICAL DATA: Follow-up thoracic aortic aneurysm in this
64-year-old male, abnormality discovered on echocardiogram

EXAM:
CT CHEST WITHOUT CONTRAST
TECHNIQUE: Multidetector CT imaging of the chest was performed following the
standard protocol without IV contrast.

[Series 2: thorax · axial · 0.84mm/px · z∈[-134,+178]mm · 11 of 186 slices shown, 14 images]
[im 15/186  mediastinal]
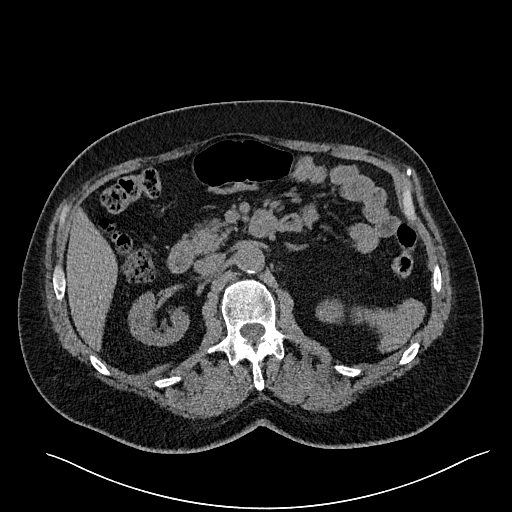
[im 15/186  lung]
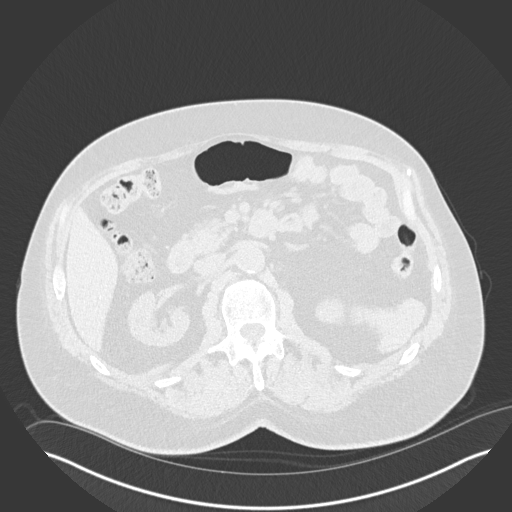
[im 29/186  lung]
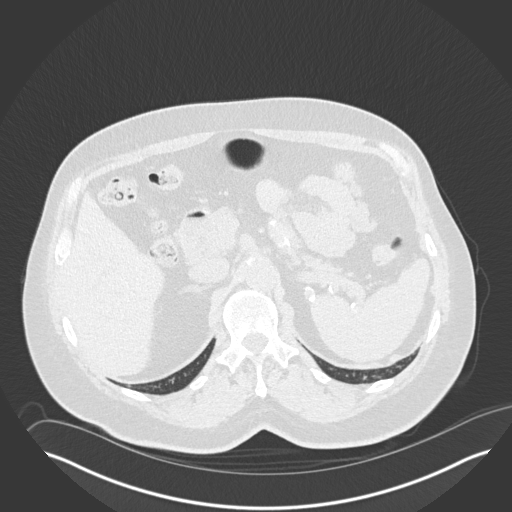
[im 43/186  lung]
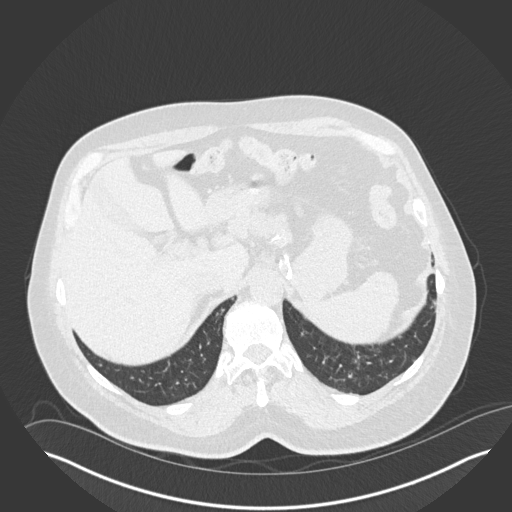
[im 57/186  lung]
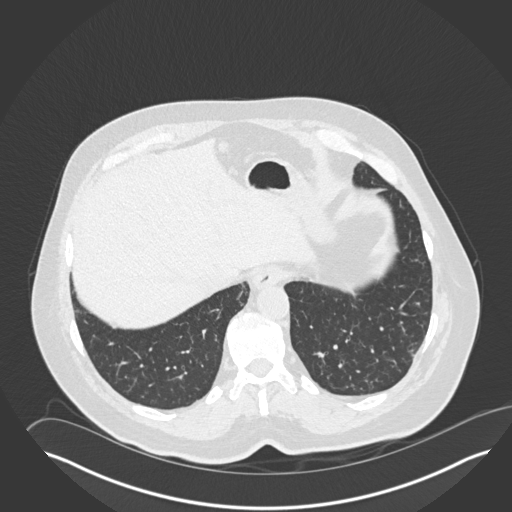
[im 72/186  mediastinal]
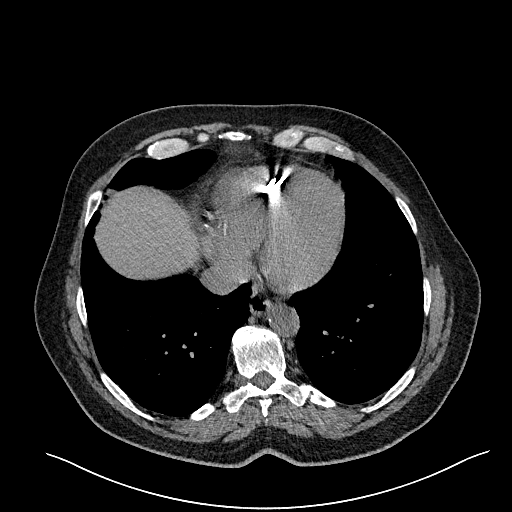
[im 72/186  lung]
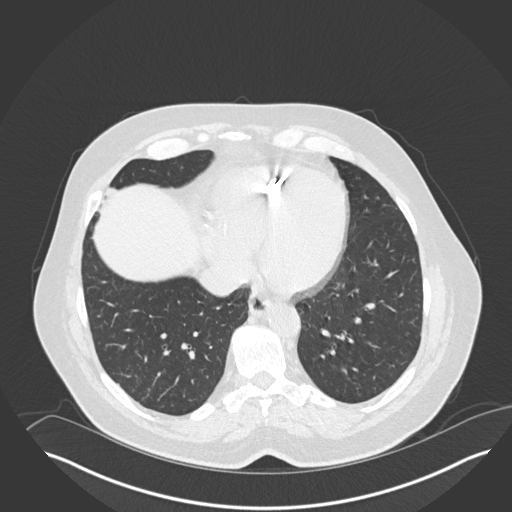
[im 100/186  lung]
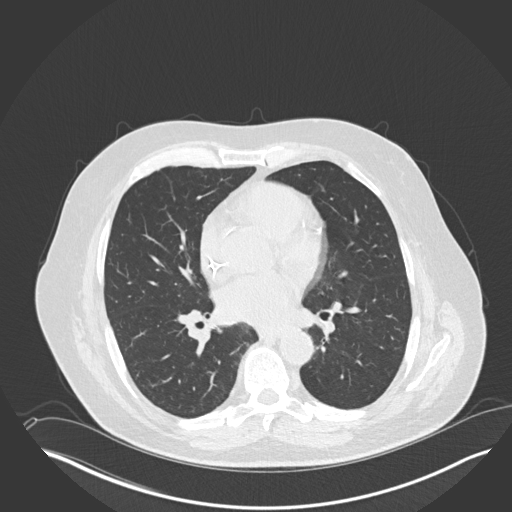
[im 114/186  lung]
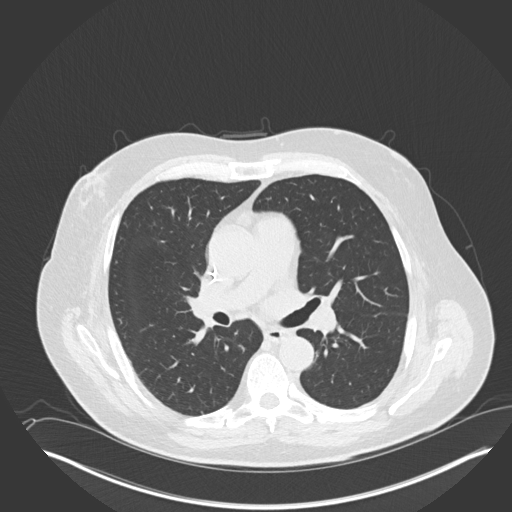
[im 129/186  lung]
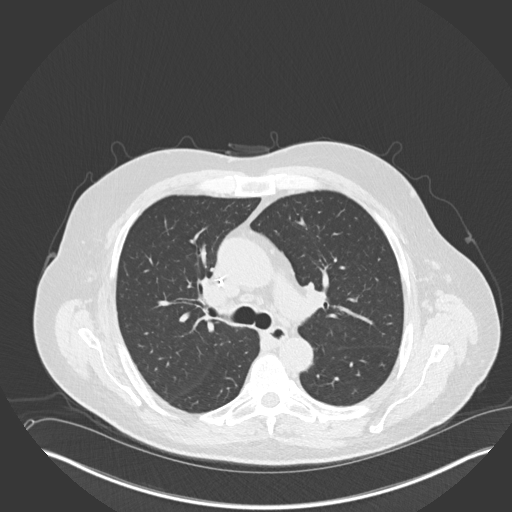
[im 143/186  mediastinal]
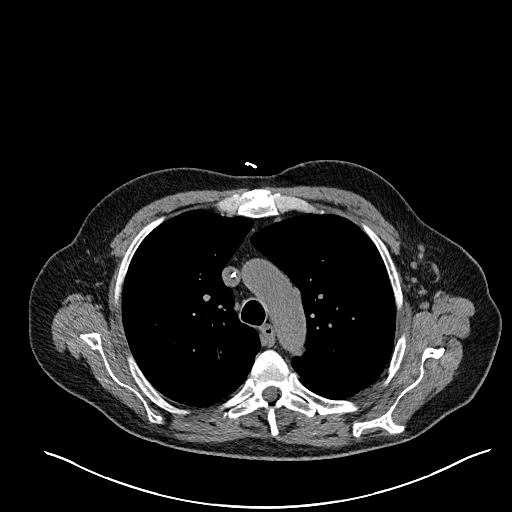
[im 143/186  lung]
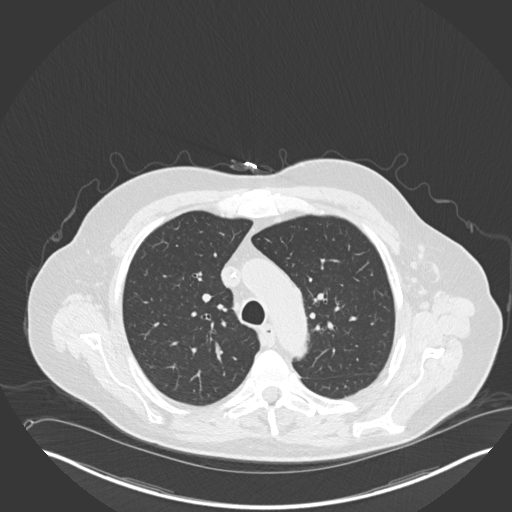
[im 157/186  lung]
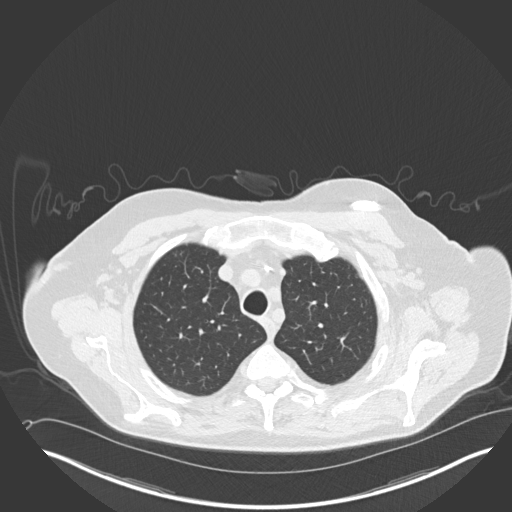
[im 171/186  lung]
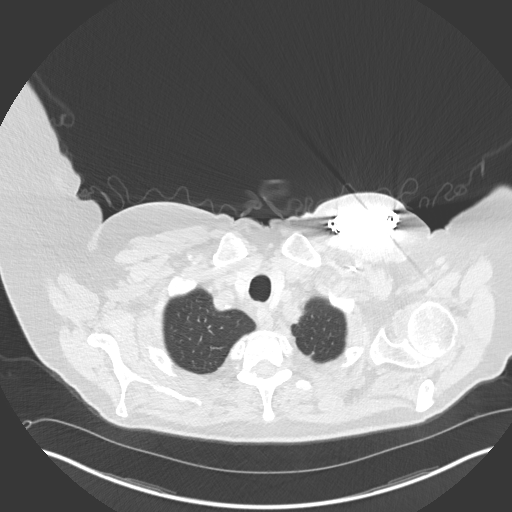

[Series 5: coronal · coronal · 0.82mm/px · 3 of 136 slices shown]
[im 28/136  lung]
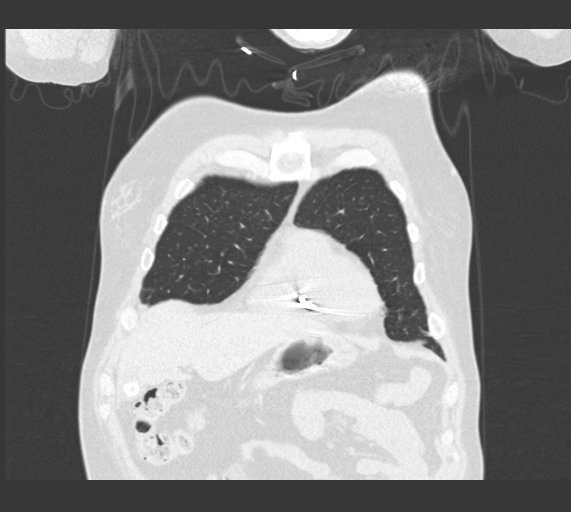
[im 55/136  lung]
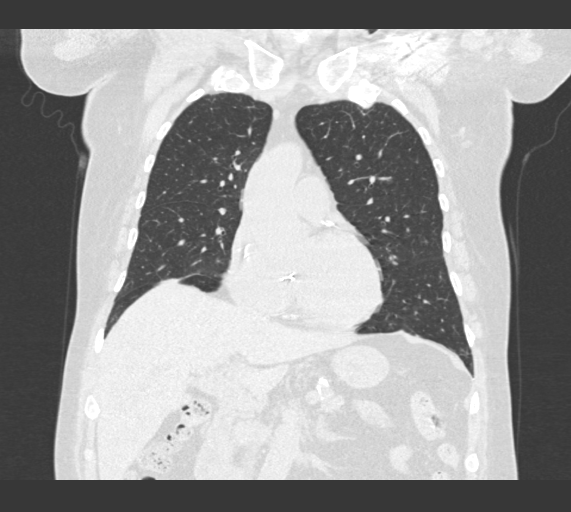
[im 82/136  lung]
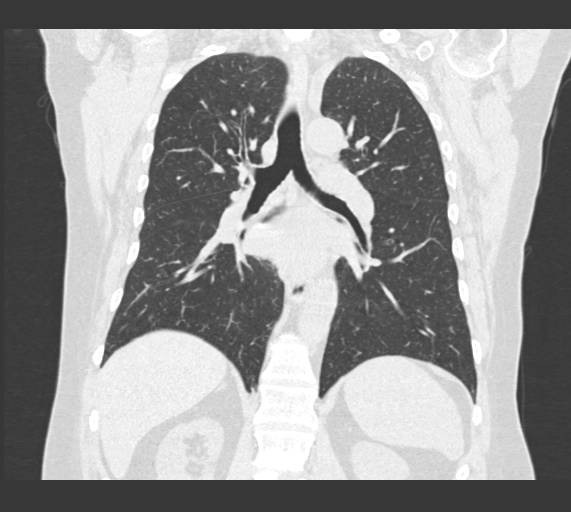

[14 of 36 positions shown; findings below may reference images not displayed]

FINDINGS: Cardiovascular: LEFT-sided single lead cardiac pacer defibrillator,
power pack over the LEFT chest.

Ascending thoracic aorta approximately 4.2 cm maximal caliber.
Scattered atheromatous calcifications. Signs of coronary artery
disease. No pericardial effusion. Heart size is normal. Central
pulmonary vasculature is normal caliber. Limited assessment of
cardiovascular structures given lack of intravenous contrast.

Mediastinum/Nodes: Thoracic inlet structures are normal. No axillary
lymphadenopathy. No mediastinal lymphadenopathy. Esophagus grossly
normal. No hilar adenopathy, limited assessment on noncontrast
imaging.

Lungs/Pleura: No consolidation. No pleural effusion. Airways are
patent.

Small calcified nodule in the RIGHT upper lobe compatible with small
granuloma. Scarring along the lateral RIGHT chest and in the
dependent medial RIGHT chest.

Scattered areas of tree-in-bud nodularity at the lung bases with
millimetric nodules seen in the dependent chest, 1-2 mm in size, for
instance a tiny RIGHT lower lobe nodule on image 110 of series 7.

Nodular pattern in the medial RIGHT chest in the as ago esophageal
recess with small nodule measuring approximately 5 mm on image 88 of
series 7 amidst other small nodules and areas of ground-glass
attenuation.

Upper Abdomen: Cholelithiasis with moderate size gallstones or
gallbladder lumen, largest approximately 1.5 cm. No pericholecystic
stranding. Imaged portions of liver, pancreas, spleen and adrenal
glands are normal.

No acute process related to imaged portions of kidneys and
gastrointestinal tract.

Musculoskeletal: No acute bone finding. No destructive bone process.
IMPRESSION: 1. Ascending thoracic aorta approximately 4.2 cm maximal caliber.
Recommend annual imaging followup by CTA or MRA. This recommendation
follows 6363 ACCF/AHA/AATS/ACR/ASA/SCA/TOWERY/BERTOLOTTO/GREENBERG/ENGBERS Guidelines
for the Diagnosis and Management of Patients with Thoracic Aortic
Disease. Circulation. 6363; 121: E266-e369. Aortic aneurysm NOS
(QNGD3-3BP.D)
2. Scattered areas of tree-in-bud nodularity at the bases. More
focal collection of nodules and ground-glass in the as ago
esophageal recess. Findings are likely related to chronic infection.
Largest nodule approximately 5 mm. Some of these areas show
ground-glass features. Non-contrast chest CT at 3-6 months is
recommended. If nodules persist and are stable at that time,
consider additional non-contrast chest CT examinations at 2 and 4
years. This recommendation follows the consensus statement:
Guidelines for Management of Incidental Pulmonary Nodules Detected
[DATE].
3. Cholelithiasis without evidence of acute cholecystitis.
4. Aortic atherosclerosis.

Aortic Atherosclerosis (QNGD3-HBJ.J).

## 2021-08-15 ENCOUNTER — Other Ambulatory Visit (HOSPITAL_COMMUNITY): Payer: Self-pay | Admitting: Cardiology

## 2021-09-01 ENCOUNTER — Encounter: Payer: Self-pay | Admitting: Internal Medicine

## 2021-09-06 ENCOUNTER — Ambulatory Visit (INDEPENDENT_AMBULATORY_CARE_PROVIDER_SITE_OTHER): Payer: Medicare Other

## 2021-09-06 DIAGNOSIS — I420 Dilated cardiomyopathy: Secondary | ICD-10-CM

## 2021-09-06 LAB — CUP PACEART REMOTE DEVICE CHECK
Battery Remaining Longevity: 38 mo
Battery Remaining Percentage: 37 %
Battery Voltage: 2.84 V
Brady Statistic RV Percent Paced: 1 %
Date Time Interrogation Session: 20230725020020
HighPow Impedance: 77 Ohm
HighPow Impedance: 77 Ohm
Implantable Lead Implant Date: 20160622
Implantable Lead Location: 753860
Implantable Lead Model: 181
Implantable Lead Serial Number: 331169
Implantable Pulse Generator Implant Date: 20160622
Lead Channel Impedance Value: 430 Ohm
Lead Channel Pacing Threshold Amplitude: 0.75 V
Lead Channel Pacing Threshold Pulse Width: 0.5 ms
Lead Channel Sensing Intrinsic Amplitude: 6.3 mV
Lead Channel Setting Pacing Amplitude: 2.5 V
Lead Channel Setting Pacing Pulse Width: 0.5 ms
Lead Channel Setting Sensing Sensitivity: 0.5 mV
Pulse Gen Serial Number: 7280026

## 2021-09-28 ENCOUNTER — Encounter (HOSPITAL_COMMUNITY): Payer: Self-pay | Admitting: Cardiology

## 2021-09-28 ENCOUNTER — Ambulatory Visit (HOSPITAL_COMMUNITY)
Admission: RE | Admit: 2021-09-28 | Discharge: 2021-09-28 | Disposition: A | Discharge status: Home / Self Care | Payer: Medicare Other | Source: Ambulatory Visit | Attending: Cardiology | Admitting: Cardiology

## 2021-09-28 VITALS — BP 94/60 | HR 60 | Wt 209.6 lb

## 2021-09-28 DIAGNOSIS — I5022 Chronic systolic (congestive) heart failure: Secondary | ICD-10-CM | POA: Diagnosis not present

## 2021-09-28 DIAGNOSIS — I4819 Other persistent atrial fibrillation: Secondary | ICD-10-CM | POA: Insufficient documentation

## 2021-09-28 LAB — BASIC METABOLIC PANEL
Anion gap: 10 (ref 5–15)
BUN: 23 mg/dL (ref 8–23)
CO2: 22 mmol/L (ref 22–32)
Calcium: 9.4 mg/dL (ref 8.9–10.3)
Chloride: 104 mmol/L (ref 98–111)
Creatinine, Ser: 1.41 mg/dL — ABNORMAL HIGH (ref 0.61–1.24)
GFR, Estimated: 55 mL/min — ABNORMAL LOW (ref >60)
Glucose, Bld: 99 mg/dL (ref 70–99)
Potassium: 4.6 mmol/L (ref 3.5–5.1)
Sodium: 136 mmol/L (ref 135–145)

## 2021-09-28 LAB — CBC
HCT: 39.6 % (ref 39.0–52.0)
Hemoglobin: 12.5 g/dL — ABNORMAL LOW (ref 13.0–17.0)
MCH: 27.2 pg (ref 26.0–34.0)
MCHC: 31.6 g/dL (ref 30.0–36.0)
MCV: 86.3 fL (ref 80.0–100.0)
Platelets: 229 10*3/uL (ref 150–400)
RBC: 4.59 MIL/uL (ref 4.22–5.81)
RDW: 14.1 % (ref 11.5–15.5)
WBC: 11.1 10*3/uL — ABNORMAL HIGH (ref 4.0–10.5)
nRBC: 0 % (ref 0.0–0.2)

## 2021-09-28 LAB — MAGNESIUM: Magnesium: 2.2 mg/dL (ref 1.7–2.4)

## 2021-09-28 NOTE — Patient Instructions (Signed)
There has been no changes to your medication  Labs done today, your results will be available in MyChart, we will contact you for abnormal readings.  Your physician has requested that you have an echocardiogram. Echocardiography is a painless test that uses sound waves to create images of your heart. It provides your doctor with information about the size and shape of your heart and how well your heart's chambers and valves are working. This procedure takes approximately one hour. There are no restrictions for this procedure.  Your physician recommends that you schedule a follow-up appointment in: 4 months with echocardiogram ( December 2023)  ** please call the office in October to schedule your appointment **  If you have any questions or concerns before your next appointment please send [place] a message through [place] or call our office at [phone].    TO LEAVE A MESSAGE FOR THE NURSE SELECT OPTION 2, PLEASE LEAVE A MESSAGE INCLUDING: YOUR NAME DATE OF BIRTH CALL BACK NUMBER REASON FOR CALL**this is important as we prioritize the call backs  YOU WILL RECEIVE A CALL BACK THE SAME DAY AS LONG AS YOU CALL BEFORE 4:00 PM  At the [facility], you and your health needs are our priority. As part of our continuing mission to provide you with exceptional heart care, we have created designated Provider Care Teams. These Care Teams include your primary Cardiologist (physician) and Advanced Practice Providers (APPs- Physician Assistants and Nurse Practitioners) who all work together to provide you with the care you need, when you need it.   You may see any of the following providers on your designated Care Team at your next follow up: Dr Arvilla Meres Dr Carron Curie, NP Robbie Lis, [place] [facility] [place], [place] Karle Plumber, PharmD   Please be sure to bring in all your medications bottles to every appointment.

## 2021-09-29 NOTE — Progress Notes (Signed)
Patient ID: Terry [name], male   DOB: 1955/11/27, 66 y.o.   MRN: [id number] Primary Care: Terry Blamer, MD Primary Cardiologist: Terry Magic, MD HF Cardiologist: Terry [name]  HPI: Terry [name] is a 66 y.o. 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 Back in 2006, an echo showed EF 15-20%.  LHC at that time showed normal coronaries.  He has been managed for cardiomyopathy since that time. In 12/14, EF had improved to 50%.  However, he has had a decline since then.  Last echo in 5/16 showed EF 25%.  Atrial fibrillation was first noted in 2013.  In 7/15, he had DCCV to NSR.  In 1/16, he was noted to be back in atrial fibrillation and appears to have been in atrial fibrillation since that time. He had St Jude ICD placed by Terry [name] in 6/16.  Given fall in EF, Lexiscan Cardiolite was done. This showed EF 39% with partially reversible inferior and apical perfusion defect. He had LHC in 7/16 showing no significant CAD.  In 8/16, he was admitted for Tikosyn initiation and converted to NSR.  Echo in 8/18 showed EF 35-40%, mild to moderately decreased RV systolic function.  Echo in 8/19 showed EF 40-45%, mild-moderate MR, mild to moderately decreased RV systolic function.   Holter in 8/19 showed 8% PVCs.   He had right TKR in 10/19 without complications.   Echo in 10/20 showed EF stable at 40-45%. Echo in 12/21 showed EF 35-40%, moderate RV enlargement with normal systolic function, 4.2 cm ascending aorta.   Echo in 12/22 showed EF 40% with mild LV dilation, mild RV dilation, normal RV systolic function, mild-moderate MR.   He presents today for followup of CHF.  He took a recent trip to [place] and had no problems.  No significant exertional dyspnea.  No chest pain.  No orthopnea/PND.  No lightheadedness. He is swimming 40 minutes/day for exercise.   ECG (personally reviewed): NSR, PVCs, LAFB with septal Qs, QTc  464  St Jude device interrogation: no VT, stable thoracic impedance.   HIV negative, ferritin normal, immunofixation with no monoclonal protein.  Labs (6/16): K 4.2, Creatinine 1.08, HCT 37.5 Labs (08/18/14): K 4.2, Creatinine 1.0 Labs (8/16): K 4.7, creatinine 1.18 Labs (9/16): Mg 2.1, creatinine 1.16, K 4.6 Labs (6/18): K 4.4, creatinine 1.25 Labs (9/18): K 4.4, creatinine 1.24 Labs (12/18): K 4.1, creatinine 1.2, hgb 11.6 Labs (10/19): K 4.3, creatinine 1.0 Labs (3/21): K 4.7, creatinine 1.52 Labs (9/21): K 4.6, creaetinine 1.57 Labs (12/21): K 4.4, creatinine 1.75 Labs (2/22): LDL 58 Labs (7/22): K 4.5, creatinine 1.55 Labs (12/22): LDL 60, creatinine 1.53  PMH 1. Nonischemic dilated cardiomyopathy: Echo (2006) with EF 15-20%.  LHC in 10/06 showed normal coronaries.  By 2014, EF had improved to 50%.  Echo in 1/16 showed EF 35-40%.  Echo (5/16) showed EF 25% with mild MR.  St Jude ICD 6/16.  HIV negative, immunofixation with no monoclonal protein, and ferritin normal.  Lexiscan Cardiolite (7/16) with EF 39% and partially reversible inferior and apical perfusion defect (intermediate risk).   LHC (7/16) with EF 35-40%, no significant CAD.  - Echo (8/17): EF 45-50% - Echo (8/18): EF 35-40%, moderate diastoilc dysfunction, mild AI, mild MR, mild to moderately decreased RV systolic function.   - Echo (8/19): EF 40-45%, mild-moderate MR, mild to moderately decreased RV systolic function.  - Echo (10/20): EF 40-45%, mild  LVH, normal RV, mild MR.  - Echo (12/21): EF 35-40%, moderate RV enlargement with normal systolic function, 4.2 cm ascending aorta.  - Echo (12/22): EF 40% with mild LV dilation, mild RV dilation, normal RV systolic function, mild-moderate MR. 2. Atrial fibrillation: Paroxysmal.  First noted in 2013.  DCCV in 7/15.  Back in atrial fibrillation 1/16.  Converted back to NSR with Tikosyn in 8/16.  3. HTN 4. Rheumatoid Arthritis: Has been off all medications for several years.    5. H/o bilateral THR 6. Gynecomastia with spironolactone.  7. PVCs: Holter in 8/19 showed 8% PVCs.  8. Ascending aortic aneurysm: 4.2 cm ascending aorta on 12/21 echo.  - CT chest (7/22) with 4.1 cm ascending aorta  SH: Lives at home with wife and 2 children and a grandchild. Never smoker, drinks 1 or 2 beers a week with dinner. 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   FH: Father with MI at 74, brother with atrial fibrillation, mother with renal failure.   Review of systems complete and found to be negative unless listed in HPI.    Current Outpatient Medications  Medication Sig Dispense Refill   atorvastatin (LIPITOR) 20 MG tablet Take 1 tablet (20 mg total) by mouth daily. 90 tablet 3   carvedilol (COREG) 25 MG tablet Take 1 tablet (25 mg total) by mouth 2 (two) times daily. 180 tablet 3   dapagliflozin propanediol (FARXIGA) 10 MG TABS tablet Take 1 tablet (10 mg total) by mouth daily before breakfast. 90 tablet 3   dofetilide (TIKOSYN) 250 MCG capsule TAKE 1 CAPSULE (250 MCG TOTAL) BY MOUTH 2 TIMES DAILY. PLEASE CALL FOR OFFICE VISIT [phone] 60 capsule 11   eplerenone (INSPRA) 50 MG tablet TAKE 1 TABLET BY MOUTH EVERY DAY 90 tablet 1   furosemide (LASIX) 40 MG tablet Take 1 tablet (40 mg total) by mouth daily. 90 tablet 3   multivitamin-iron-minerals-folic acid (CENTRUM) chewable tablet Chew 1 tablet by mouth daily.     rivaroxaban (XARELTO) 20 MG TABS tablet Take 1 tablet (20 mg total) by mouth daily with supper. 90 tablet 3   sacubitril-valsartan (ENTRESTO) 97-103 MG Take 1 tablet by mouth 2 (two) times daily. 180 tablet 3   No current facility-administered medications for this encounter.   Allergies  Allergen Reactions   Other Other (See Comments)   Spironolactone     Gynecomastia   Sulfa Antibiotics     Thrush and hives   Vitals:   09/28/21 1017  BP: 94/60  Pulse: 60  SpO2: 99%  Weight: 95.1 kg (209 lb 9.6 oz)    Wt Readings from Last 3  Encounters:  09/28/21 95.1 kg (209 lb 9.6 oz)  04/26/21 95.2 kg (209 lb 12.8 oz)  01/19/21 93.1 kg (205 lb 3.2 oz)    PHYSICAL EXAM: General: NAD Neck: No JVD, no thyromegaly or thyroid nodule.  Lungs: Clear to auscultation bilaterally with normal respiratory effort. CV: Nondisplaced PMI.  Heart regular S1/S2, no S3/S4, no murmur.  No peripheral edema.  No carotid bruit.  Normal pedal pulses.  Abdomen: Soft, nontender, no hepatosplenomegaly, no distention.  Skin: Intact without lesions or rashes.  Neurologic: Alert and oriented x 3.  Psych: Normal affect. Extremities: No clubbing or cyanosis.  HEENT: Normal.   ASSESSMENT & PLAN: 1. Chronic systolic HF: Nonischemic cardiomyopathy x years.  He has a Secondary school teacher ICD and is not a candidate for CRT due to narrow QRS.  7/16 LHC showed no significant  CAD.  Most recent echo in 12/22 with EF 40%, stable.  NYHA class I-II symptoms, not volume overloaded by exam.  - Continue dapagliflozin 10 mg daily.    - Continue Coreg 25 mg BID - Continue Entresto 97/103 bid.   - Gynecomastia resolved off spironolactone.  Continue eplerenone 50 mg daily. BMET today.  - Repeat echo in 12/23.  2.  Atrial fibrillation: Paroxysmal.  Holding NSR on Tikosyn.  QTc ok on ECG today.  - BMET and Mg today.  - Continue Xarelto 20 mg daily. CBC today.  3. PVCs: Holter in 8/19 showed 8% PVCs.  This is unlikely to affect his cardiomyopathy.  Still with occasional PVCs, denies palpitations.  4. CKD: Stage 3.   - Continue dapagliflozin, BMET today.  5. Dilated ascending aorta: 4.1 cm on CT chest in 7/22. Reassess by echo in 12/23.   Followup in 4 months with echo  Loralie Champagne, MD  09/29/2021

## 2021-10-04 NOTE — Progress Notes (Signed)
Remote ICD transmission.   

## 2021-10-07 ENCOUNTER — Encounter (HOSPITAL_BASED_OUTPATIENT_CLINIC_OR_DEPARTMENT_OTHER): Payer: Self-pay | Admitting: Cardiology

## 2021-10-11 ENCOUNTER — Encounter: Payer: Self-pay | Admitting: Cardiology

## 2021-10-17 ENCOUNTER — Encounter (HOSPITAL_BASED_OUTPATIENT_CLINIC_OR_DEPARTMENT_OTHER): Payer: Self-pay | Admitting: Cardiology

## 2021-11-01 DIAGNOSIS — J019 Acute sinusitis, unspecified: Secondary | ICD-10-CM | POA: Diagnosis not present

## 2021-11-14 ENCOUNTER — Telehealth: Payer: Self-pay | Admitting: Internal Medicine

## 2021-11-14 DIAGNOSIS — R918 Other nonspecific abnormal finding of lung field: Secondary | ICD-10-CM

## 2021-11-14 NOTE — Telephone Encounter (Signed)
Plan from 12/06/20:     Patient Instructions        ICD-10-CM    1. Pulmonary infiltrate present on computed tomography  R91.8       2. History of rheumatoid arthritis  Z87.39       3. Multiple lung nodules on CT  R91.8       4. Personal history of COVID-19  Z86.16           Bronchoscopy august 2022 - negative for organism CT OCt 2022 improved nodules significantly Glad uptodte with vaccines   Plan - avodi respiratory infections with masking and avoiding clustering of humans   Followup  - followup in 1 year - can decide CT chest based on followup    Called and spoke with pt letting him know this info. Pt stated that if okay, he would like to go ahead and have a Ct performed prior to OV. MR, please advise on this if you are okay with a CT order being placed for pt to have CT done prior to upcoming OV.

## 2021-11-14 NOTE — Telephone Encounter (Signed)
Okay to get CT chest without contrast ahead of his visit with me

## 2021-11-14 NOTE — Telephone Encounter (Signed)
Patient is wanting to schedule CT scan before his yearly on 12/06/2021- no order placed. Please advise and call patient back.

## 2021-11-16 NOTE — Telephone Encounter (Signed)
CT order has been placed. Nothing further needed. 

## 2021-11-17 ENCOUNTER — Ambulatory Visit: Payer: Medicare Other | Attending: Internal Medicine | Admitting: Internal Medicine

## 2021-11-17 VITALS — BP 86/64 | HR 59 | Ht 71.0 in | Wt 211.0 lb

## 2021-11-17 DIAGNOSIS — I480 Paroxysmal atrial fibrillation: Secondary | ICD-10-CM

## 2021-11-17 DIAGNOSIS — I5022 Chronic systolic (congestive) heart failure: Secondary | ICD-10-CM

## 2021-11-17 DIAGNOSIS — Z95810 Presence of automatic (implantable) cardiac defibrillator: Secondary | ICD-10-CM | POA: Diagnosis not present

## 2021-11-17 NOTE — Patient Instructions (Signed)
Medication Instructions:  Your physician recommends that you continue on your current medications as directed. Please refer to the Current Medication list given to you today.  *If you need a refill on your cardiac medications before your next appointment, please call your pharmacy*  Lab Work: None ordered.  If you have labs (blood work) drawn today and your tests are completely normal, you will receive your results only by: [place] (if you have MyChart) OR A paper copy in the mail If you have any lab test that is abnormal or we need to change your treatment, we will call you to review the results.  Testing/Procedures: None ordered.  Follow-Up: At [facility], you and your health needs are our priority.  As part of our continuing mission to provide you with exceptional heart care, we have created designated Provider Care Teams.  These Care Teams include your primary Cardiologist (physician) and Advanced Practice Providers (APPs -  Physician Assistants and Nurse Practitioners) who all work together to provide you with the care you need, when you need it.  We recommend signing up for the patient portal called "MyChart".  Sign up information is provided on this After Visit Summary.  MyChart is used to connect with patients for Virtual Visits (Telemedicine).  Patients are able to view lab/test results, encounter notes, upcoming appointments, etc.  Non-urgent messages can be sent to your provider as well.   To learn more about what you can do with MyChart, go to [email or web address].    Your next appointment:   1 year(s)  The format for your next appointment:   In Person  Provider:   Cristopher Peru, MD{or one of the following Advanced Practice Providers on your designated Care Team:   Tommye Standard, [place] Legrand Como "Jonni Sanger" Chalmers Cater, [place]  Remote monitoring is used to monitor your ICD from home. This monitoring reduces the number of office visits required to check your device to one  time per year. It allows [place] to keep an eye on the functioning of your device to ensure it is working properly. You are scheduled for a device check from home on 12/06/21. You may send your transmission at any time that day. If you have a wireless device, the transmission will be sent automatically. After your physician reviews your transmission, you will receive a postcard with your next transmission date.  Important Information About [place]

## 2021-11-17 NOTE — Progress Notes (Signed)
ekg 

## 2021-11-17 NOTE — Progress Notes (Signed)
HPI Terry [name] returns today for followup. He is a pleasant 66 yo man with a non-ischemic CM, persistent atrial fib, s/p ICD insertion. He denies palpitations, chest pain or sob. He has been swimming but stopped a few weeks ago but has gone back to walking. He has not had syncope. No ICD shock. Allergies  Allergen Reactions   Other Other (See Comments)   Spironolactone     Gynecomastia   Sulfa Antibiotics     Thrush and hives     Current Outpatient Medications  Medication Sig Dispense Refill   atorvastatin (LIPITOR) 20 MG tablet Take 1 tablet (20 mg total) by mouth daily. 90 tablet 3   carvedilol (COREG) 25 MG tablet Take 1 tablet (25 mg total) by mouth 2 (two) times daily. 180 tablet 3   dapagliflozin propanediol (FARXIGA) 10 MG TABS tablet Take 1 tablet (10 mg total) by mouth daily before breakfast. 90 tablet 3   dofetilide (TIKOSYN) 250 MCG capsule TAKE 1 CAPSULE (250 MCG TOTAL) BY MOUTH 2 TIMES DAILY. PLEASE CALL FOR OFFICE VISIT [phone] 60 capsule 11   eplerenone (INSPRA) 50 MG tablet TAKE 1 TABLET BY MOUTH EVERY DAY 90 tablet 1   furosemide (LASIX) 40 MG tablet Take 1 tablet (40 mg total) by mouth daily. 90 tablet 3   multivitamin-iron-minerals-folic acid (CENTRUM) chewable tablet Chew 1 tablet by mouth daily.     rivaroxaban (XARELTO) 20 MG TABS tablet Take 1 tablet (20 mg total) by mouth daily with supper. 90 tablet 3   sacubitril-valsartan (ENTRESTO) 97-103 MG Take 1 tablet by mouth 2 (two) times daily. 180 tablet 3   Influenza Vac Split Quad (FLUZONE QUADRIVALENT IM) TO BE ADMINISTERED BY PHARMACIST FOR IMMUNIZATION     Zoster Vaccine Adjuvanted [facility]) injection      No current facility-administered medications for this visit.     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  S/P AICD for primary prevention.  Repeat EF 01/2020 35-40%.    PAF (paroxysmal atrial fibrillation) ([place])    "off and on since 2013" (08/05/2014)   PVC (premature ventricular contraction)    PVC load 8%   Rheumatoid arthritis ([place]) dx'd 1995    ROS:   All systems reviewed and negative except as noted in the HPI.   Past Surgical History:  Procedure Laterality Date   BRONCHIAL WASHINGS  10/05/2020   Procedure: BRONCHIAL WASHINGS;  Surgeon: Brand Males, MD;  Location: [facility] ENDOSCOPY;  Service: Endoscopy;;   CARDIAC CATHETERIZATION  ~ 2004   normal   CARDIAC CATHETERIZATION N/A 09/07/2014   Procedure: Left Heart Cath and Coronary Angiography;  Surgeon: Larey Dresser, MD;  Location: [facility]  INVASIVE CV LAB;  Service: Cardiovascular;  Laterality: N/A;   CARDIAC DEFIBRILLATOR PLACEMENT  08/05/2014   St. 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    JOINT REPLACEMENT     NASAL SEPTUM SURGERY  ~ 1975   TONSILLECTOMY     TOTAL HIP ARTHROPLASTY  09/08/2011   Procedure: TOTAL HIP ARTHROPLASTY ANTERIOR APPROACH;  Surgeon: [name], MD;   Location: [facility] ORS;  Service: Orthopedics;  Laterality: Right;  Right total hip replacement, left knee steroid injection   TOTAL KNEE ARTHROPLASTY Left 11/29/2012   Procedure: LEFT TOTAL KNEE ARTHROPLASTY;  Surgeon: [name], MD;  Location: [facility] ORS;  Service: Orthopedics;  Laterality: Left;  FEMORAL NERVE BLOCK IN HOLDING AREA LEFT LEG   TOTAL KNEE ARTHROPLASTY Right 11/30/2017   Procedure: RIGHT TOTAL KNEE ARTHROPLASTY;  Surgeon: [name], MD;  Location: [facility] OR[facility];  Service: Orthopedics;  Laterality: Right;   URETHRAL FISTULA REPAIR  ~ 1996   URETHRAL STRICTURE DILATATION  "several times"   VIDEO BRONCHOSCOPY N/A 10/05/2020   Procedure: VIDEO BRONCHOSCOPY WITHOUT FLUORO;  Surgeon: Kalman Shan, MD;  Location: [facility] ENDOS[facility];  Service: Endoscopy;  Laterality: N/A;     Family History  Problem Relation Age of Onset   Heart disease Father    Heart attack Father 74   Heart failure Brother      Social History   Socioeconomic History   Marital status: Married    Spouse name: Not on file   Number of children: Not on file   Years of education: Not on file   Highest education level: Not on file  Occupational History   Not on file  Tobacco Use   Smoking status: Never   Smokeless tobacco: Never  Vaping Use   Vaping Use: Never used  Substance and Sexual Activity   Alcohol use: Yes    Alcohol/week: 1.0 standard drink of alcohol    Types: 1 Cans of beer per week   Drug use: No   Sexual activity: Yes  Other Topics Concern   Not on file  Social History Narrative   Not on file   Social Determinants of Health   Financial Resource Strain: Not on file  Food Insecurity: Not on file  Transportation Needs: Not on file  Physical Activity: Not on file  Stress: Not on file  Social Connections: Not on file  Intimate Partner Violence: Not on file     BP (!) 86/64   Pulse (!) 59   Ht 5\' 11"  (1.803 m)   Wt 211 lb (95.7 kg)   SpO2 94%   BMI 29.43 kg/m    Physical Exam:  Well appearing NAD HEENT: Unremarkable Neck:  No JVD, no thyromegally Lymphatics:  No adenopathy Back:  No CVA tenderness Lungs:  Clear with no wheezes HEART:  Regular rate rhythm, no murmurs, no rubs, no clicks Abd:  soft, positive bowel sounds, no organomegally, no rebound, no guarding Ext:  2 plus pulses, no edema, no cyanosis, no clubbing Skin:  No rashes no nodules Neuro:  CN II through XII  intact, motor grossly intact  EKG - nsr  DEVICE  Normal device function.  See PaceArt for details.   Assess/Plan:   1. Chronic systolic heart failure - his symptoms remain class 2A. He will continue his current meds. 2. ICD - his St. Jude single chamber ICD is working normally. 3. Persistent atrial fib - he is maintaining NSR on dofetilide. 4. Coags - he has not had any bleeding on xarelto.     Sharlot Gowda Floretta Petro,MD

## 2021-11-30 ENCOUNTER — Ambulatory Visit
Admission: RE | Admit: 2021-11-30 | Discharge: 2021-11-30 | Disposition: A | Discharge status: Home / Self Care | Payer: Medicare Other | Source: Ambulatory Visit | Attending: Internal Medicine | Admitting: Internal Medicine

## 2021-11-30 DIAGNOSIS — R918 Other nonspecific abnormal finding of lung field: Secondary | ICD-10-CM

## 2021-11-30 DIAGNOSIS — I700 Atherosclerosis of aorta: Secondary | ICD-10-CM | POA: Diagnosis not present

## 2021-12-02 ENCOUNTER — Encounter (HOSPITAL_COMMUNITY): Payer: Self-pay | Admitting: Cardiology

## 2021-12-02 ENCOUNTER — Encounter (HOSPITAL_BASED_OUTPATIENT_CLINIC_OR_DEPARTMENT_OTHER): Payer: Self-pay | Admitting: Cardiology

## 2021-12-02 DIAGNOSIS — I700 Atherosclerosis of aorta: Secondary | ICD-10-CM

## 2021-12-06 ENCOUNTER — Encounter: Payer: Self-pay | Admitting: Internal Medicine

## 2021-12-06 ENCOUNTER — Ambulatory Visit: Payer: Medicare Other | Admitting: Internal Medicine

## 2021-12-06 ENCOUNTER — Encounter (HOSPITAL_BASED_OUTPATIENT_CLINIC_OR_DEPARTMENT_OTHER): Payer: Self-pay | Admitting: Cardiology

## 2021-12-06 ENCOUNTER — Encounter (HOSPITAL_COMMUNITY): Payer: Self-pay | Admitting: Cardiology

## 2021-12-06 ENCOUNTER — Ambulatory Visit (INDEPENDENT_AMBULATORY_CARE_PROVIDER_SITE_OTHER): Payer: Medicare Other

## 2021-12-06 VITALS — BP 108/62 | HR 65 | Temp 97.6°F | Ht 71.0 in | Wt 211.4 lb

## 2021-12-06 DIAGNOSIS — J849 Interstitial pulmonary disease, unspecified: Secondary | ICD-10-CM

## 2021-12-06 DIAGNOSIS — R918 Other nonspecific abnormal finding of lung field: Secondary | ICD-10-CM

## 2021-12-06 DIAGNOSIS — J672 Bird fancier's lung: Secondary | ICD-10-CM | POA: Diagnosis not present

## 2021-12-06 DIAGNOSIS — E559 Vitamin D deficiency, unspecified: Secondary | ICD-10-CM

## 2021-12-06 DIAGNOSIS — Z7729 Contact with and (suspected ) exposure to other hazardous substances: Secondary | ICD-10-CM | POA: Diagnosis not present

## 2021-12-06 DIAGNOSIS — R0989 Other specified symptoms and signs involving the circulatory and respiratory systems: Secondary | ICD-10-CM | POA: Diagnosis not present

## 2021-12-06 DIAGNOSIS — I420 Dilated cardiomyopathy: Secondary | ICD-10-CM

## 2021-12-06 LAB — HEPATIC FUNCTION PANEL
ALT: 7 U/L (ref 0–53)
AST: 16 U/L (ref 0–37)
Albumin: 4 g/dL (ref 3.5–5.2)
Alkaline Phosphatase: 84 U/L (ref 39–117)
Bilirubin, Direct: 0.1 mg/dL (ref 0.0–0.3)
Total Bilirubin: 0.5 mg/dL (ref 0.2–1.2)
Total Protein: 7.6 g/dL (ref 6.0–8.3)

## 2021-12-06 LAB — CBC WITH DIFFERENTIAL/PLATELET
Basophils Absolute: 0.1 10*3/uL (ref 0.0–0.1)
Basophils Relative: 1 % (ref 0.0–3.0)
Eosinophils Absolute: 0.9 10*3/uL — ABNORMAL HIGH (ref 0.0–0.7)
Eosinophils Relative: 8.6 % — ABNORMAL HIGH (ref 0.0–5.0)
HCT: 35.1 % — ABNORMAL LOW (ref 39.0–52.0)
Hemoglobin: 11.4 g/dL — ABNORMAL LOW (ref 13.0–17.0)
Lymphocytes Relative: 17.2 % (ref 12.0–46.0)
Lymphs Abs: 1.8 10*3/uL (ref 0.7–4.0)
MCHC: 32.4 g/dL (ref 30.0–36.0)
MCV: 83.3 fl (ref 78.0–100.0)
Monocytes Absolute: 1 10*3/uL (ref 0.1–1.0)
Monocytes Relative: 9.4 % (ref 3.0–12.0)
Neutro Abs: 6.7 10*3/uL (ref 1.4–7.7)
Neutrophils Relative %: 63.8 % (ref 43.0–77.0)
Platelets: 265 10*3/uL (ref 150.0–400.0)
RBC: 4.22 Mil/uL (ref 4.22–5.81)
RDW: 14.6 % (ref 11.5–15.5)
WBC: 10.4 10*3/uL (ref 4.0–10.5)

## 2021-12-06 LAB — BASIC METABOLIC PANEL
BUN: 26 mg/dL — ABNORMAL HIGH (ref 6–23)
CO2: 27 mEq/L (ref 19–32)
Calcium: 9.3 mg/dL (ref 8.4–10.5)
Chloride: 101 mEq/L (ref 96–112)
Creatinine, Ser: 1.37 mg/dL (ref 0.40–1.50)
GFR: 53.81 mL/min — ABNORMAL LOW (ref >60.00)
Glucose, Bld: 105 mg/dL — ABNORMAL HIGH (ref 70–99)
Potassium: 4.3 mEq/L (ref 3.5–5.1)
Sodium: 135 mEq/L (ref 135–145)

## 2021-12-06 LAB — HEMOGLOBIN A1C: Hgb A1c MFr Bld: 6.3 % (ref 4.6–6.5)

## 2021-12-06 MED ORDER — PREDNISONE 10 MG PO TABS: ORAL_TABLET | ORAL | 0 refills | Status: AC

## 2021-12-06 NOTE — Progress Notes (Signed)
IOV 03/26/2020  Subjective:  Patient ID: Terry [name], male , DOB: 1956-01-15 , age 66 y.o. , MRN: [id number] , ADDRESS: [place] [address] PCP Shirline Frees, MD Patient Care Team: Shirline Frees, MD as PCP - General (Family Medicine) Sueanne Margarita, MD as PCP - Cardiology (Cardiology)  This Provider for this visit: Treatment Team:  Attending Provider: Brand Males, MD    03/26/2020 -   Chief Complaint  Patient presents with   Consult     HPI Terry [name] 66 y.o. -non-smoker but with a long history of rheumatoid arthritis.  He is not on any immunomodulators for over 15 years.  He feels his quality of life is better with the immunomodulators.  He keeps himself physically active.  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  He had a CT scan of the chest for this that showed nonspecific nodules.  I personally visualized this and agree with this.  Some of these are scattered groundglass opacities some of them are tree-in-bud.  Particularly in the right lower lobe and also in the esophageal recess.  He denies any postnasal drip or acid reflux.  He denies any shortness of breath.  Of note he has cough that is chronic for the last 1 year.  He has been on Entresto for a few years.  A year ago Dr. Loralie Champagne his cardiologist increase the [facility] and therefore his cough got worse.  His cough is rated at a scale of 3 out of 10 both early in the morning and late at night.  Early morning is particularly more.  Nevertheless overall the burden is mild.  There is no sputum production.  There is no shortness of breath or wheezing.    CT Chest data -January 28, 2020 personally visualized.  And agree with the findings.   IMPRESSION: 1. Ascending thoracic aorta approximately 4.2 cm maximal caliber. Recommend annual imaging followup by CTA or MRA. This recommendation follows 2010 ACCF/AHA/AATS/ACR/ASA/SCA/SCAI/SIR/STS/SVM Guidelines for the Diagnosis and  Management of Patients with Thoracic Aortic Disease. Circulation. 2010; 121: M355-H741. Aortic aneurysm NOS (ICD10-I71.9) 2. Scattered areas of tree-in-bud nodularity at the bases. More focal collection of nodules and ground-glass in the as ago esophageal recess. Findings are likely related to chronic infection. Largest nodule approximately 5 mm. Some of these areas show ground-glass features. Non-contrast chest CT at 3-6 months is recommended. If nodules persist and are stable at that time, consider additional non-contrast chest CT examinations at 2 and 4 years. This recommendation follows the consensus statement: Guidelines for Management of Incidental Pulmonary Nodules Detected on CT Images: From the Fleischner Society 2017; Radiology 2017; 284:228-243. 3. Cholelithiasis without evidence of acute cholecystitis. 4. Aortic atherosclerosis.   Aortic Atherosclerosis (ICD10-I70.0).     Electronically Signed   By: Zetta Bills M.D.  OV 09/21/2020  Subjective:  Patient ID: Terry [name], male , DOB: Apr 07, 1955 , age 33 y.o. , MRN: [id number] , ADDRESS: [place] [address] PCP Shirline Frees, MD Patient Care Team: Shirline Frees, MD as PCP - General (Family Medicine) Sueanne Margarita, MD as PCP - Cardiology (Cardiology)  This Provider for this visit: Treatment Team:  Attending Provider: Brand Males, MD    09/21/2020 -   Chief Complaint  Patient presents with   Follow-up    Pt states he has been doing okay since last visit and denies any concerns.   Follow-up mild chronic cough in the setting of  Entresto Follow-up history of rheumatoid arthritis not on immunomodulators Follow-up nonspecific CT scan infiltrates with multiple lung nodules -no shortness of breath  HPI [name] 66 y.o. -returns for follow-up.  Since I last saw him in May 2022 he went to [place] and [place].  While in [place] mid May 2022 he developed mild COVID infection.  He recovered  from it.  He never had much symptoms.  He feels fine right now.  He just has mild cough because of Entresto.  No shortness of breath.  We went over the CT scan results showing waxing and waning tree-in-bud opacities particular in the lower lobe but overall increased.  Also slight increase in nodularity particularly 7 mm left lower lobe.  He is quite worried about this.  His wife indicated that he worries and is anxious quite a bit.  Explained to him in the setting of recent COVID this could be COVID-related accentuation of abnormalities and we could opt to follow-up in several months to see if things settle down.  He reflected on this.  After this we also discussed about the idea of having a bronchoscopy with lavage right now to rule out any MAI.  We went over the differential diagnosis of MAI, interstitial reticular abnormalities with potential to transform into ILD and rheumatoid nodules.  Based on this he wants to proceed with a bronchoscopy with lavage at this point to rule out any infection.  He is preparing to go to [place] in the end of September 2022.  Therefore he wants all his bases covered.   Risks of pneumothorax, hemothorax, sedation/anesthesia complications such as cardiac or respiratory arrest or hypotension, stroke and bleeding all explained. Benefits of diagnosis but limitations of non-diagnosis also explained. Patient verbalized understanding and wished to proceed.      CT Chest data July 2022    IMPRESSION: 1. Moderate patchy tree-in-bud opacities and scattered centrilobular nodules in the peripheral lungs bilaterally, most prominent at the lung bases, overall increased, although with some waxing and waning behavior. Minimal cylindrical bronchiolectasis at the lung bases. Findings are most compatible with a nonspecific chronic or recurrent infectious or inflammatory bronchiolitis, with the differential including atypical mycobacterial infection (MAI) or  recurrent aspiration. 2. The largest new pulmonary nodule measures 7 mm in the posterior left lower lobe, favor inflammatory. Suggest attention on follow-up chest CT in 6-12 months. 3. 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 Recommend annual imaging followup by CTA or MRA. This recommendation follows 2010 ACCF/AHA/AATS/ACR/ASA/SCA/SCAI/SIR/STS/SVM Guidelines for the Diagnosis and Management of Patients with Thoracic Aortic Disease. Circulation. 2010; 121: D638-V564. Aortic aneurysm NOS (ICD10-I71.9). 4. Three-vessel coronary atherosclerosis. 5. Cholelithiasis. 6. Aortic Atherosclerosis (ICD10-I70.0).     Electronically Signed   By: Ilona Sorrel M.D.   On: 08/17/2020 11:40   Video visit Sept 2022  History of Present Illness: 66 year old male seen for pulmonary consult March 26, 2020 for abnormal CT chest with pulmonary nodules and chronic cough Medical history significant for rheumatoid arthritis, chronic systolic congestive heart failure due to nonischemic cardiomyopathy status post [facility] ICD June 2016.  (Echo 2006 EF 15 to 20%), A. Fib COVID infection May 2022 reported as mild symptoms  Today's video visit is a 6-week follow-up.  Patient is being followed for abnormal CT chest with scattered pulmonary nodules.  And chronic cough in the setting of Entresto.  Patient does have congestive heart failure/cardiomyopathy.  Recent CT chest done August 17, 2020 showed patchy tree-in-bud opacities and scattered nodules maximum at  7 mm.  And some minimal bronchiectasis.  Patient was set up for bronchoscopy with lavage.  This was done on October 05, 2020 airway exam showed minimum clear secretions, normal mucosa and no visible endobronchial lesions.  BAL culture was negative with normal respiratory flora, AFB culture was negative, pneumocystis jiroveci Ag negative, fungal culture negative, cytology identified no malignant cells, benign bronchial cells and pulmonary  macrophages. Patient says overall his breathing is doing okay.  He remains very active.  O2 saturations at home remain around 98% on room air.  Patient swims at least 45 minutes each day.  Denies any significant cough. We reviewed his culture reports.   OV 12/06/2020  Subjective:  Patient ID: Terry [name], male , DOB: 12/15/1955 , age 50 y.o. , MRN: [id number] , ADDRESS: [place] [address] PCP Shirline Frees, MD Patient Care Team: Shirline Frees, MD as PCP - General (Family Medicine) Sueanne Margarita, MD as PCP - Cardiology (Cardiology)  This Provider for this visit: Treatment Team:  Attending Provider: Brand Males, MD    12/06/2020 -   Chief Complaint  Patient presents with   Follow-up    No c/o, follow up on Super D CT    HPI returns for follow Terry [name] 66 y.o. -returns for follow-up.  Presents with his wife.  He just came back from a trip to [place] in [place].  He was walking 5 to 6 miles a day.  Felt really good.  He had bronchoscopy with lavage in August 2022 before this trip and the cultures are negative.  This was reviewed by nurse practitioner.  He now had a follow-up CT scan of the chest that shows significant improvement.  This is reviewed below.  He has no new complaints.  He is up-to-date with his respiratory vaccines.    CT Chest data 12/01/20  IMPRESSION: Decreased size of the LEFT lower lobe pulmonary nodule now 2 mm compatible with a resolving infectious or inflammatory changes.   Patchy tree-in-bud nodularity and subtle ground-glass seen on the previous exam also improved and compatible with improving appearance of sequela of chronic infection.   Three-vessel coronary artery disease.   Mild aneurysmal dilation of the ascending thoracic aorta 4.1 cm is similar to the prior study. Recommend annual imaging followup by CTA or MRA. This recommendation follows 2010 ACCF/AHA/AATS/ACR/ASA/SCA/SCAI/SIR/STS/SVM Guidelines for  the Diagnosis and Management of Patients with Thoracic Aortic Disease. Circulation. 2010; 121: Y850-Y774. Aortic aneurysm NOS (ICD10-I71.9)   Cholelithiasis.   Aortic Atherosclerosis (ICD10-I70.0).     Electronically Signed   By: Zetta Bills M.D.   On: 12/02/2020 08:45  OV 12/06/2021  Subjective:  Patient ID: Terry [name], male , DOB: 01-12-1956 , age 85 y.o. , MRN: [id number] , ADDRESS: [place] [address] PCP Shirline Frees, MD Patient Care Team: Shirline Frees, MD as PCP - General (Family Medicine) Sueanne Margarita, MD as PCP - Cardiology (Cardiology)  This Provider for this visit: Treatment Team:  Attending Provider: Brand Males, MD   Follow-up mild chronic cough in the setting of Entresto Follow-up history of rheumatoid arthritis not on immunomodulators Follow-up nonspecific CT scan infiltrates with multiple lung nodules -no shortness of breath - July 2022  - bronch bal - neg aug 2022. BAL lymphocyte 25%  - CT improved - oct 2022 Mild covi mid may 2022 in [place]  12/06/2021 -   Chief Complaint  Patient presents with   Follow-up    Follow-up visit PT  states no problems with breathing     HPI AXTON CIHLAR 66 y.o. - presents with wife. ROV is a year to date. He is doing well. Minimal symptoms. Had sinus infection a month ago and Rx with doxy. In May 2023 went to [place] and [place]. NExt may 2024 plans to go to [place].Overall well  other than mild cough early in the moerning attributed to entrerstoe. He does stationary boke an has no dyspnea.  However, on exam there was rLL crackles and on CT - Dr Weber Cooks feels nodularity is worse than last year and features fit in with HP. I also think there is aRLL intestritual infiltrate that is new since last year. So, asking about exposures- he has had 2parakeets x 20 years in kitchen. Wife cleans them but he sits near them in the mornings. Occasinally he cleans them. He also has some  down jackets which he likes veyr much  BAL last year showed 25% lymphocytes     Narrative & Impression  CLINICAL DATA:  66 year old male with history of pulmonary nodules. Follow-up study.   EXAM: CT CHEST WITHOUT CONTRAST   TECHNIQUE: Multidetector CT imaging of the chest was performed following the standard protocol without IV contrast.   RADIATION DOSE REDUCTION: This exam was performed according to the departmental dose-optimization program which includes automated exposure control, adjustment of the mA and/or kV according to patient size and/or use of iterative reconstruction technique.   COMPARISON:  Chest CT 12/01/2020.   FINDINGS: Cardiovascular: Heart size is normal. There is no significant pericardial fluid, thickening or pericardial calcification. There is aortic atherosclerosis, as well as atherosclerosis of the great vessels of the mediastinum and the coronary arteries, including calcified atherosclerotic plaque in the left main, left anterior descending, left circumflex and right coronary arteries. Left-sided pacemaker/AICD with lead tip terminating in the right ventricular apex.   Mediastinum/Nodes: No pathologically enlarged mediastinal or hilar lymph nodes. Please note that accurate exclusion of hilar adenopathy is limited on noncontrast CT scans. Esophagus is unremarkable in appearance. No axillary lymphadenopathy.   Lungs/Pleura: Diffuse bronchial wall thickening, mild thickening of the peribronchovascular interstitium and widespread centrilobular ground-glass attenuation micro nodularity scattered throughout the lungs bilaterally, overall, increased compared to the prior examination. No other definite larger more suspicious appearing pulmonary nodules or masses are noted. No confluent consolidative airspace disease. No pleural effusions.   Upper Abdomen: Numerous calcified gallstones in the gallbladder measuring up to 1.7 cm in diameter.  Gallbladder is nearly completely collapsed in otherwise unremarkable in appearance. Atherosclerosis in the abdominal aorta.   Musculoskeletal: There are no aggressive appearing lytic or blastic lesions noted in the visualized portions of the skeleton.   IMPRESSION: 1. There is a spectrum of findings in the lungs suggestive of probable hypersensitivity pneumonitis. Alternatively, mild atypical infection could be considered in the appropriate clinical setting. Further clinical evaluation is recommended. 2. Aortic atherosclerosis, in addition to left main and three-vessel coronary artery disease. Please note that although the presence of coronary artery calcium documents the presence of coronary artery disease, the severity of this disease and any potential stenosis cannot be assessed on this non-gated CT examination. Assessment for potential risk factor modification, dietary therapy or pharmacologic therapy may be warranted, if clinically indicated. 3. Cholelithiasis.   Aortic Atherosclerosis (ICD10-I70.0).     Electronically Signed   By: Vinnie Langton M.D.   On: 12/02/2021 07:59    PFT      No data to display  has a past medical history of AICD (automatic cardioverter/defibrillator) present, Anemia, Aortic atherosclerosis ([place]), Aortic insufficiency, Ascending aorta dilation (H[facility]), Chronic systolic CHF (congestive heart failure), NYHA class 1 ([place]), Complication of anesthesia, GERD (gastroesophageal reflux disease), H/O hematuria, H/O hiatal hernia, Hepatitis (1960's), History of blood transfusion (2013; 2014), Hypertension, Nonischemic dilated cardiomyopathy ([place]), PAF (paroxysmal atrial fibrillation) ([place]), PVC (premature ventricular contraction), and Rheumatoid arthritis ([place]) (dx'd 1995).   reports that he has never smoked. He has never used smokeless tobacco.  Past Surgical History:  Procedure Laterality Date   BRONCHIAL WASHINGS  10/05/2020    Procedure: BRONCHIAL WASHINGS;  Surgeon: Brand Males, MD;  Location: [facility];  Service: Endoscopy;;   CARDIAC CATHETERIZATION  ~ 2004   normal   CARDIAC CATHETERIZATION N/A 09/07/2014   Procedure: Left Heart Cath and Coronary Angiography;  Surgeon: Larey Dresser, MD;  Location: [place] [facility];  Service: Cardiovascular;  Laterality: N/A;   CARDIAC DEFIBRILLATOR PLACEMENT  08/05/2014   St. 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    JOINT REPLACEMENT     NASAL SEPTUM SURGERY  ~ [place]  09/08/2011   Procedure: TOTAL HIP ARTHROPLASTY ANTERIOR APPROACH;  Surgeon: Mcarthur Rossetti, MD;  Location: [facility];  Service: Orthopedics;  Laterality: Right;  Right total hip replacement, left knee steroid injection   TOTAL KNEE ARTHROPLASTY Left 11/29/2012   Procedure: LEFT TOTAL KNEE ARTHROPLASTY;  Surgeon: Mcarthur Rossetti, MD;  Location: [facility];  Service: Orthopedics;  Laterality: Left;  FEMORAL NERVE BLOCK IN HOLDING AREA LEFT LEG   TOTAL KNEE ARTHROPLASTY Right 11/30/2017   Procedure: RIGHT TOTAL KNEE ARTHROPLASTY;  Surgeon: Mcarthur Rossetti, MD;  Location: [facility];  Service: Orthopedics;  Laterality: Right;   URETHRAL FISTULA REPAIR  ~ [place]  "several times"   VIDEO BRONCHOSCOPY N/A 10/05/2020   Procedure: VIDEO BRONCHOSCOPY WITHOUT FLUORO;  Surgeon: Brand Males, MD;  Location: [facility];  Service: Endoscopy;  Laterality: N/A;    Allergies  Allergen Reactions   Other Other (See Comments)    Spironolactone     Gynecomastia   Sulfa Antibiotics     Thrush and hives    Immunization History  Administered Date(s) Administered   Fluad Quad(high Dose 65+) 10/10/2021   Influenza Split 11/30/2008, 10/26/2011, 10/18/2013, 08/06/2014, 10/11/2014, 10/22/2015, 09/30/2016, 09/28/2018, 09/28/2019   Influenza,inj,Quad PF,6+ Mos 09/30/2016, 10/06/2017, 09/28/2018   Influenza-Unspecified 10/17/2020   PFIZER Comirnaty(Gray Top)Covid-19 Tri-Sucrose Vaccine 05/12/2020   PFIZER(Purple Top)SARS-COV-2 Vaccination 04/30/2019, 05/21/2019, 11/23/2019   Pfizer Covid-19 Vaccine Bivalent Booster 70yr & up 10/24/2020, 06/14/2021   Pneumococcal Polysaccharide-23 08/06/2014   Respiratory Syncytial Virus Vaccine,Recomb Aduvanted(Arexvy) 10/15/2021   Td 12/20/2016   Tdap 11/26/2006   Zoster Recombinat (Shingrix) 10/26/2018, 12/28/2018   Zoster, Live 12/14/2015, 10/26/2018, 12/28/2018    Family History  Problem Relation Age of Onset   Heart disease Father    Heart attack Father 595  Heart failure Brother      Current Outpatient Medications:    atorvastatin (LIPITOR)  20 MG tablet, Take 1 tablet (20 mg total) by mouth daily., Disp: 90 tablet, Rfl: 3   carvedilol (COREG) 25 MG tablet, Take 1 tablet (25 mg total) by mouth 2 (two) times daily., Disp: 180 tablet, Rfl: 3   dapagliflozin propanediol (FARXIGA) 10 MG TABS tablet, Take 1 tablet (10 mg total) by mouth daily before breakfast., Disp: 90 tablet, Rfl: 3   dofetilide (TIKOSYN) 250 MCG capsule, TAKE 1 CAPSULE (250 MCG TOTAL) BY MOUTH 2 TIMES DAILY. PLEASE CALL FOR OFFICE VISIT [phone], Disp: 60 capsule, Rfl: 11   eplerenone (INSPRA) 50 MG tablet, TAKE 1 TABLET BY MOUTH EVERY DAY, Disp: 90 tablet, Rfl: 1   furosemide (LASIX) 40 MG tablet, Take 1 tablet (40 mg total) by mouth daily., Disp: 90 tablet, Rfl: 3   Influenza Vac Split Quad (FLUZONE QUADRIVALENT IM), TO BE ADMINISTERED BY PHARMACIST FOR IMMUNIZATION, Disp: , Rfl:     multivitamin-iron-minerals-folic acid (CENTRUM) chewable tablet, Chew 1 tablet by mouth daily., Disp: , Rfl:    predniSONE (DELTASONE) 10 MG tablet, Take 4 tablets (40 mg total) by mouth daily with breakfast for 7 days, THEN 3 tablets (30 mg total) daily with breakfast for 7 days, THEN 2 tablets (20 mg total) daily with breakfast for 7 days, THEN 1 tablet (10 mg total) daily with breakfast for 7 days, THEN 0.5 tablets (5 mg total) daily with breakfast for 7 days. 75mMON,WED,FRIx7DAYS,Then5mgMON,ThenSTOP., Disp: 90 tablet, Rfl: 0   rivaroxaban (XARELTO) 20 MG TABS tablet, Take 1 tablet (20 mg total) by mouth daily with supper., Disp: 90 tablet, Rfl: 3   sacubitril-valsartan (ENTRESTO) 97-103 MG, Take 1 tablet by mouth 2 (two) times daily., Disp: 180 tablet, Rfl: 3   Zoster Vaccine Adjuvanted ([facility] injection, , Disp: , Rfl:       Objective:   Vitals:   12/06/21 0849  BP: 108/62  Pulse: 65  Temp: 97.6 F (36.4 C)  TempSrc: Oral  SpO2: 96%  Weight: 211 lb 6.4 oz (95.9 kg)  Height: _0  (1.803 m)    Estimated body mass index is 29.48 kg/m as calculated from the following:   Height as of this encounter: _1  (1.803 m).   Weight as of this encounter: 211 lb 6.4 oz (95.9 kg).  _2 @  Filed Weights   12/06/21 0849  Weight: 211 lb 6.4 oz (95.9 kg)     Physical Exam  General: No distress. Looks well Neuro: Alert and Oriented x 3. GCS 15. Speech normal Psych: Pleasant Resp:  Barrel Chest - n0.  Wheeze - no, Crackles - YES RLL > ? LLL, No overt respiratory distress CVS: Normal heart sounds. Murmurs - no Ext: Stigmata of Connective Tissue Disease - RA deforimiteis of hand + HEENT: Normal upper airway. PEERL +. No post nasal drip        Assessment:       ICD-10-CM   1. 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 Multiple lung nodules on CT  R91.8     4. ILD (  interstitial lung disease) ([place])  J84.9     5. Respiratory crackles at right lung base  R09.89     6. Vitamin D deficiency  E55.9 Vitamin D 1,25 dihydroxy    Vitamin D 1,25 dihydroxy         Plan:     Patient Instructions     ICD-10-CM   1. 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 Multiple lung nodules on CT  R91.8     4. ILD (interstitial lung disease) ([place])  J84.9     5. 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 long term parakeets in your house x 20 years  Plan -Take ILD questionnaire home and bring it back   -Get rid of all down jackets in the house including your wife's  -Remove the birds from indoor environment in the house -Make sure there is no mold in the house -D0 safety blood work before starting steroids  -CBC, chemistry, liver function test, QuantiFERON gold, hemoglobin A1c, Vit D -Do 59-monthprednisone course  -We discussed various side effects of prednisone including mood changes, blood sugar changes and blood pressure changes  -Please take Take prednisone 462monce daily x 7 days, then 302mnce daily x 7 days, then 34m67mce daily x 7 days, then prednisone 10mg50me daily  x 7 days and then 5 mg once daily x7 days and then 5 mg Monday Wednesday Friday x7 days  and then 5 mg on Monday only and then stop  Follow-up -4-week video visit with nurse practitioner to report on prednisone therapy - Spirometry and DLCO in 2-3 months -Return to see Dr. RamasChase [place] visit in 2-3 months  -ILD questionnaire at  follow-up   Have also emailed requesting placingon ILD conerence   ( Level 05 visit: Estb 40-54 min  New 60-74 min   in  visit type: on-site physical face to visit  in total care time and counseling or/and coordination of care by this undersigned MD - Dr MuralBrand Maless includes one or more of the following on this same day 12/06/2021: pre-charting, chart review, note writing, documentation discussion of test results, diagnostic or treatment recommendations, prognosis, risks and benefits of management options, instructions, education, compliance or risk-factor reduction. It excludes time spent by the CMA [facility] staff in the care of the patient. Actual time 54 mi42   SIGNATURE    Dr. MuralBrand Males., F.C.C.P,  Pulmonary and Critical Care Medicine Staff Physician, [facility] - Interstitial Lung Disease  Program  Pulmonary [id number]: [phone] answer or between  15:00h - 7:00h: call [phone] Telephone: [phone]  10:40 PM 12/06/2021

## 2021-12-06 NOTE — Patient Instructions (Addendum)
ICD-10-CM   1. 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 Multiple lung nodules on CT  R91.8     4. ILD (interstitial lung disease) ([place])  J84.9     5. 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 long term parakeets in your house x 20 years  Plan -Take ILD questionnaire home and bring it back   -Get rid of all down jackets in the house including your wife's  -Remove the birds from indoor environment in the house -Make sure there is no mold in the house -D0 safety blood work before starting steroids  -CBC, chemistry, liver function test, QuantiFERON gold, hemoglobin A1c, Vit D -Do 21-month prednisone course  -We discussed various side effects of prednisone including mood changes, blood sugar changes and blood pressure changes  -Please take Take prednisone 40mg  once daily x 7 days, then 30mg  once daily x 7 days, then 20mg  once daily x 7 days, then prednisone 10mg  once daily  x 7 days and then 5 mg once daily x7 days and then 5 mg Monday Wednesday Friday x7 days  and then 5 mg on Monday only and then stop  Follow-up -4-week video visit with nurse practitioner to report on prednisone therapy - Spirometry and DLCO in 2-3 months -Return to see Dr. Chase Caller 30-minute visit in 2-3 months  -ILD questionnaire at follow-up

## 2021-12-06 NOTE — Progress Notes (Signed)
Some resuls still pending but overall normal except mild anemial -- stable and high blood eosinophils. PLAN: get RAST allergy panel and blood IgE 12/07/21  Before the prednisone fully kicks in

## 2021-12-07 ENCOUNTER — Encounter: Payer: Self-pay | Admitting: Internal Medicine

## 2021-12-07 DIAGNOSIS — D721 Eosinophilia, unspecified: Secondary | ICD-10-CM

## 2021-12-08 ENCOUNTER — Other Ambulatory Visit: Payer: Medicare Other

## 2021-12-08 DIAGNOSIS — D721 Eosinophilia, unspecified: Secondary | ICD-10-CM

## 2021-12-08 LAB — CUP PACEART REMOTE DEVICE CHECK
Battery Remaining Longevity: 34 mo
Battery Remaining Percentage: 33 %
Battery Voltage: 2.83 V
Brady Statistic RV Percent Paced: 1 %
Date Time Interrogation Session: 20231024123725
HighPow Impedance: 80 Ohm
HighPow Impedance: 80 Ohm
Implantable Lead Connection Status: 753985
Implantable Lead Implant Date: 20160622
Implantable Lead Location: 753860
Implantable Lead Model: 181
Implantable Lead Serial Number: 331169
Implantable Pulse Generator Implant Date: 20160622
Lead Channel Impedance Value: 440 Ohm
Lead Channel Pacing Threshold Amplitude: 0.75 V
Lead Channel Pacing Threshold Pulse Width: 0.5 ms
Lead Channel Sensing Intrinsic Amplitude: 7.7 mV
Lead Channel Setting Pacing Amplitude: 2.5 V
Lead Channel Setting Pacing Pulse Width: 0.5 ms
Lead Channel Setting Sensing Sensitivity: 0.5 mV
Pulse Gen Serial Number: 7280026
Zone Setting Status: 755011

## 2021-12-08 NOTE — Telephone Encounter (Signed)
I called the patient and he reports that he has made an appointment  for his blood work and he will call back for a follow up at a later date. Nothing further needed.

## 2021-12-09 ENCOUNTER — Ambulatory Visit (HOSPITAL_COMMUNITY)
Admission: RE | Admit: 2021-12-09 | Discharge: 2021-12-09 | Disposition: A | Discharge status: Home / Self Care | Payer: Medicare Other | Source: Ambulatory Visit | Attending: Cardiology | Admitting: Cardiology

## 2021-12-09 DIAGNOSIS — I700 Atherosclerosis of aorta: Secondary | ICD-10-CM

## 2021-12-09 LAB — RESPIRATORY ALLERGY PROFILE REGION II ~~LOC~~
Allergen, A. alternata, m6: <0.1 kU/L
Allergen, Cedar tree, t12: 0.12 kU/L — ABNORMAL HIGH
Allergen, Comm Silver Birch, t9: 0.16 kU/L — ABNORMAL HIGH
Allergen, Cottonwood, t14: 0.23 kU/L — ABNORMAL HIGH
Allergen, D pternoyssinus,d7: 0.37 kU/L — ABNORMAL HIGH
Allergen, Mouse Urine Protein, e78: <0.1 kU/L
Allergen, Mulberry, t76: <0.1 kU/L
Allergen, Oak,t7: 0.12 kU/L — ABNORMAL HIGH
Allergen, P. notatum, m1: <0.1 kU/L
Aspergillus fumigatus, m3: <0.1 kU/L
Bermuda Grass: 0.12 kU/L — ABNORMAL HIGH
Box Elder IgE: 0.32 kU/L — ABNORMAL HIGH
CLADOSPORIUM HERBARUM (M2) IGE: <0.1 kU/L
COMMON RAGWEED (SHORT) (W1) IGE: <0.1 kU/L
Cat Dander: <0.1 kU/L
Class: 0
Class: 0
Class: 0
Class: 0
Class: 0
Class: 0
Class: 0
Class: 0
Class: 0
Class: 0
Class: 0
Class: 0
Class: 0
Class: 0
Class: 1
Class: 2
Cockroach: <0.1 kU/L
D. farinae: 0.75 kU/L — ABNORMAL HIGH
Dog Dander: <0.1 kU/L
Elm IgE: 0.27 kU/L — ABNORMAL HIGH
IgE (Immunoglobulin E), Serum: 86 kU/L (ref <=114)
Johnson Grass: <0.1 kU/L
Pecan/Hickory Tree IgE: 0.14 kU/L — ABNORMAL HIGH
Rough Pigweed  IgE: <0.1 kU/L
Sheep Sorrel IgE: <0.1 kU/L
Timothy Grass: <0.1 kU/L

## 2021-12-09 LAB — INTERPRETATION:

## 2021-12-10 ENCOUNTER — Encounter (HOSPITAL_BASED_OUTPATIENT_CLINIC_OR_DEPARTMENT_OTHER): Payer: Self-pay | Admitting: Cardiology

## 2021-12-10 ENCOUNTER — Encounter: Payer: Self-pay | Admitting: Cardiology

## 2021-12-10 DIAGNOSIS — R931 Abnormal findings on diagnostic imaging of heart and coronary circulation: Secondary | ICD-10-CM | POA: Insufficient documentation

## 2021-12-10 LAB — VITAMIN D 1,25 DIHYDROXY
Vitamin D 1, 25 (OH)2 Total: 21 pg/mL (ref 18–72)
Vitamin D2 1, 25 (OH)2: <8 pg/mL
Vitamin D3 1, 25 (OH)2: 21 pg/mL

## 2021-12-11 LAB — QUANTIFERON-TB GOLD PLUS
Mitogen-NIL: 3.17 IU/mL
NIL: 0.01 IU/mL
QuantiFERON-TB Gold Plus: NEGATIVE
TB1-NIL: 0 IU/mL
TB2-NIL: 0.01 IU/mL

## 2021-12-12 ENCOUNTER — Encounter (HOSPITAL_COMMUNITY): Payer: Self-pay | Admitting: Cardiology

## 2021-12-12 ENCOUNTER — Telehealth: Payer: Self-pay

## 2021-12-12 ENCOUNTER — Other Ambulatory Visit: Payer: Self-pay | Admitting: Cardiology

## 2021-12-12 DIAGNOSIS — R931 Abnormal findings on diagnostic imaging of heart and coronary circulation: Secondary | ICD-10-CM

## 2021-12-12 DIAGNOSIS — E7800 Pure hypercholesterolemia, unspecified: Secondary | ICD-10-CM

## 2021-12-12 DIAGNOSIS — E785 Hyperlipidemia, unspecified: Secondary | ICD-10-CM

## 2021-12-12 DIAGNOSIS — I420 Dilated cardiomyopathy: Secondary | ICD-10-CM

## 2021-12-12 NOTE — Telephone Encounter (Signed)
   Shared Decision Making/Informed Consent The risks [chest pain, shortness of breath, cardiac arrhythmias, dizziness, blood pressure fluctuations, myocardial infarction, stroke/transient ischemic attack, nausea, vomiting, allergic reaction, radiation exposure, metallic taste sensation and life-threatening complications (estimated to be 1 in 10,000)], benefits (risk stratification, diagnosing coronary artery disease, treatment guidance) and alternatives of a cardiac PET stress test were discussed in detail with Terry [name] and he agrees to proceed.

## 2021-12-12 NOTE — Telephone Encounter (Signed)
The patient has been notified of the result and verbalized understanding.  All questions (if any) were answered. Michaelyn Barter, RN 12/12/2021 2:04 PM   Patient is already scheduled for echo in December.   Placed order for lab and NM PET CT. Patient will get lab work tomorrow. Will send instructions through mychart for PET CT.   Placed order for attestation for Dr. Radford Pax to sign.

## 2021-12-12 NOTE — Telephone Encounter (Signed)
-----   Message from Sueanne Margarita, MD sent at 12/10/2021  6:50 PM EDT ----- Coronary Ca score markedly elevated - please get an FLP and set up for Stress PET CT.  He also has a mildly dilated ascending aorta at 85mm.  Get echo in 1 year for dilated aorta

## 2021-12-13 ENCOUNTER — Ambulatory Visit: Payer: Medicare Other | Attending: Cardiology

## 2021-12-13 DIAGNOSIS — R931 Abnormal findings on diagnostic imaging of heart and coronary circulation: Secondary | ICD-10-CM

## 2021-12-13 DIAGNOSIS — E785 Hyperlipidemia, unspecified: Secondary | ICD-10-CM | POA: Diagnosis not present

## 2021-12-13 DIAGNOSIS — E7800 Pure hypercholesterolemia, unspecified: Secondary | ICD-10-CM

## 2021-12-13 DIAGNOSIS — I420 Dilated cardiomyopathy: Secondary | ICD-10-CM

## 2021-12-14 ENCOUNTER — Telehealth: Payer: Self-pay

## 2021-12-14 DIAGNOSIS — E7800 Pure hypercholesterolemia, unspecified: Secondary | ICD-10-CM

## 2021-12-14 DIAGNOSIS — R931 Abnormal findings on diagnostic imaging of heart and coronary circulation: Secondary | ICD-10-CM

## 2021-12-14 LAB — LIPID PANEL
Chol/HDL Ratio: 2.9 ratio (ref 0.0–5.0)
Cholesterol, Total: 123 mg/dL (ref 100–199)
HDL: 42 mg/dL (ref >39)
LDL Chol Calc (NIH): 64 mg/dL (ref 0–99)
Triglycerides: 89 mg/dL (ref 0–149)
VLDL Cholesterol Cal: 17 mg/dL (ref 5–40)

## 2021-12-14 LAB — NMR, LIPOPROFILE
Cholesterol, Total: 129 mg/dL (ref 100–199)
HDL Particle Number: 26 umol/L — ABNORMAL LOW (ref >=30.5)
HDL-C: 43 mg/dL (ref >39)
LDL Particle Number: 947 nmol/L (ref <1000)
LDL Size: 20.4 nm — ABNORMAL LOW (ref >20.5)
LDL-C (NIH Calc): 67 mg/dL (ref 0–99)
LP-IR Score: 50 — ABNORMAL HIGH (ref <=45)
Small LDL Particle Number: 553 nmol/L — ABNORMAL HIGH (ref <=527)
Triglycerides: 100 mg/dL (ref 0–149)

## 2021-12-14 LAB — LIPOPROTEIN A (LPA): Lipoprotein (a): 26 nmol/L (ref <75.0)

## 2021-12-14 MED ORDER — ATORVASTATIN CALCIUM 40 MG PO TABS: 40.0000 mg | ORAL_TABLET | Freq: Every day | ORAL | 3 refills | Status: DC

## 2021-12-14 NOTE — Telephone Encounter (Signed)
Sueanne Margarita, MD  12/14/2021  2:11 PM EDT     NMR panel with normal LDL particle number but small LDL particle # is above goal and LDL size on the smaller end.  Increase Atorvastatin to 40mg  daily and check NMR panel in 6 weeks.  Also get a HbA1C with next lipid panel    The patient has been notified of the result and verbalized understanding.  All questions (if any) were answered. Bernestine Amass, RN 12/14/2021 3:28 PM  Rx has been sent in and labs have been scheduled.

## 2021-12-15 ENCOUNTER — Encounter (HOSPITAL_COMMUNITY): Payer: Self-pay | Admitting: Cardiology

## 2021-12-26 ENCOUNTER — Encounter (HOSPITAL_BASED_OUTPATIENT_CLINIC_OR_DEPARTMENT_OTHER): Payer: Self-pay | Admitting: Cardiology

## 2021-12-26 NOTE — Progress Notes (Signed)
Remote ICD transmission.   

## 2021-12-27 ENCOUNTER — Other Ambulatory Visit: Payer: Self-pay | Admitting: Orthopedic Surgery

## 2021-12-27 DIAGNOSIS — M25519 Pain in unspecified shoulder: Secondary | ICD-10-CM

## 2021-12-27 DIAGNOSIS — M19011 Primary osteoarthritis, right shoulder: Secondary | ICD-10-CM | POA: Diagnosis not present

## 2021-12-27 DIAGNOSIS — M25511 Pain in right shoulder: Secondary | ICD-10-CM | POA: Diagnosis not present

## 2021-12-27 DIAGNOSIS — M25512 Pain in left shoulder: Secondary | ICD-10-CM | POA: Diagnosis not present

## 2021-12-28 ENCOUNTER — Other Ambulatory Visit: Payer: Self-pay | Admitting: Internal Medicine

## 2022-01-03 ENCOUNTER — Encounter: Payer: Self-pay | Admitting: Adult Health

## 2022-01-03 ENCOUNTER — Telehealth: Payer: Self-pay | Admitting: *Deleted

## 2022-01-03 ENCOUNTER — Telehealth (INDEPENDENT_AMBULATORY_CARE_PROVIDER_SITE_OTHER): Payer: Medicare Other | Admitting: Adult Health

## 2022-01-03 VITALS — BP 98/60 | HR 66 | Temp 97.4°F | Ht 71.0 in | Wt 206.0 lb

## 2022-01-03 DIAGNOSIS — D721 Eosinophilia, unspecified: Secondary | ICD-10-CM

## 2022-01-03 DIAGNOSIS — Z7729 Contact with and (suspected ) exposure to other hazardous substances: Secondary | ICD-10-CM

## 2022-01-03 DIAGNOSIS — J672 Bird fancier's lung: Secondary | ICD-10-CM

## 2022-01-03 NOTE — Addendum Note (Signed)
Addended by: Delrae Rend on: 01/03/2022 02:05 PM   Modules accepted: Orders

## 2022-01-03 NOTE — Progress Notes (Signed)
Virtual Visit via Video Note  I connected with Terry [name] on 01/03/22 at 10:00 AM EST by a video enabled telemedicine application and verified that I am speaking with the correct person using two identifiers.  Location: Patient: Home  Provider: Office    I discussed the limitations of evaluation and management by telemedicine and the availability of in person appointments. The patient expressed understanding and agreed to proceed.  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 Fib   Today's video visit is a 1 month follow-up.  Patient was seen last visit for a an exacerbation of hypersensitivity pneumonitis. Presented with increased cough , sinus drainage , and dyspnea.  Patient history positive for long-term exposure to down coats and parakeets in his home.  Patient was recommended to remove all birds from his home.  And was started on a slow prednisone taper.  He is currently on prednisone 5mg  daily. Has 10 days left of taper.  Lab work showed negative QuantiFERON gold, allergy panel was positive for dust, trees, IgE 86. Elevated absolute eosinophils at 900 .  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  He has gotten rid of all down clothing and pillows  and all birds are gone. Cleaned the house he is feeling much better with resolution of cough . Dyspnea is gone. Going to gym twice a week without any issues. Was able to yard work.    Observations/Objective: Echo 01/19/2021 EF 40%, Gr 1 DD . RVSP 28.8 mmHg.   Bronchoscopy august 2022 - negative for  organism , 25% lymphocytes , neg cytology   CT chest 11/2020 mild subpleural reticulation in lung bases , tree in bud nodularity completely resolved from previous study . Decreased nodule in LLL    Assessment and Plan: Hypersensitivity Pneumonitis - recent flare -long term home exposure to birds/down coat/beddings- perceived clinical benefit with steroids and removal of birds/down bedding/clothing in home .  Rec taper prednisone to off as directed over next 2 weeks.   Plan  . Patient Instructions  Finish Prednisone as directed  Activity as tolerated.  Follow up with Dr. 12/2020 in 6 weeks with PFT and As needed       Follow Up Instructions:    I discussed the assessment and treatment plan with the patient. The patient was provided an opportunity to ask questions and all were answered. The patient agreed with the plan and demonstrated an understanding of the instructions.   The patient was advised to call back or seek an in-person evaluation if the symptoms worsen or if the condition fails to improve as anticipated.  I provided 30  minutes of non-face-to-face time during this encounter.   Marchelle Gearing, NP

## 2022-01-03 NOTE — Telephone Encounter (Signed)
ATC patient to schedule him for a 6 week follow up with pft.  LVM to return call to schedule.  Dr. Marchelle Gearing currently has openings on January 4th and there are also PFT openings currently for that day as well.  When he returns call, please schedule him for f/u (PFT and OV).

## 2022-01-03 NOTE — Patient Instructions (Signed)
Finish Prednisone as directed  Activity as tolerated.  Follow up with Dr. Marchelle Gearing in 6 weeks with PFT and As needed

## 2022-01-13 DIAGNOSIS — I480 Paroxysmal atrial fibrillation: Secondary | ICD-10-CM | POA: Diagnosis not present

## 2022-01-13 DIAGNOSIS — Z0000 Encounter for general adult medical examination without abnormal findings: Secondary | ICD-10-CM | POA: Diagnosis not present

## 2022-01-13 DIAGNOSIS — I5020 Unspecified systolic (congestive) heart failure: Secondary | ICD-10-CM | POA: Diagnosis not present

## 2022-01-13 DIAGNOSIS — I10 Essential (primary) hypertension: Secondary | ICD-10-CM | POA: Diagnosis not present

## 2022-01-13 DIAGNOSIS — D649 Anemia, unspecified: Secondary | ICD-10-CM | POA: Diagnosis not present

## 2022-01-13 DIAGNOSIS — J679 Hypersensitivity pneumonitis due to unspecified organic dust: Secondary | ICD-10-CM | POA: Diagnosis not present

## 2022-01-13 DIAGNOSIS — N3000 Acute cystitis without hematuria: Secondary | ICD-10-CM | POA: Diagnosis not present

## 2022-01-16 ENCOUNTER — Encounter (HOSPITAL_BASED_OUTPATIENT_CLINIC_OR_DEPARTMENT_OTHER): Payer: Self-pay | Admitting: Cardiology

## 2022-01-17 ENCOUNTER — Encounter (HOSPITAL_BASED_OUTPATIENT_CLINIC_OR_DEPARTMENT_OTHER): Payer: Self-pay | Admitting: Cardiology

## 2022-01-25 DIAGNOSIS — H5213 Myopia, bilateral: Secondary | ICD-10-CM | POA: Diagnosis not present

## 2022-01-25 DIAGNOSIS — H02831 Dermatochalasis of right upper eyelid: Secondary | ICD-10-CM | POA: Diagnosis not present

## 2022-01-25 DIAGNOSIS — H02834 Dermatochalasis of left upper eyelid: Secondary | ICD-10-CM | POA: Diagnosis not present

## 2022-01-25 DIAGNOSIS — H10503 Unspecified blepharoconjunctivitis, bilateral: Secondary | ICD-10-CM | POA: Diagnosis not present

## 2022-01-25 DIAGNOSIS — H527 Unspecified disorder of refraction: Secondary | ICD-10-CM | POA: Diagnosis not present

## 2022-01-30 ENCOUNTER — Ambulatory Visit
Admission: RE | Admit: 2022-01-30 | Discharge: 2022-01-30 | Disposition: A | Discharge status: Home / Self Care | Payer: Medicare Other | Source: Ambulatory Visit | Attending: Orthopedic Surgery | Admitting: Orthopedic Surgery

## 2022-01-30 DIAGNOSIS — M25519 Pain in unspecified shoulder: Secondary | ICD-10-CM

## 2022-01-30 DIAGNOSIS — M19011 Primary osteoarthritis, right shoulder: Secondary | ICD-10-CM | POA: Diagnosis not present

## 2022-01-30 DIAGNOSIS — Z950 Presence of cardiac pacemaker: Secondary | ICD-10-CM | POA: Diagnosis not present

## 2022-01-30 DIAGNOSIS — Z01818 Encounter for other preprocedural examination: Secondary | ICD-10-CM | POA: Diagnosis not present

## 2022-01-31 ENCOUNTER — Ambulatory Visit (HOSPITAL_COMMUNITY)
Admission: RE | Admit: 2022-01-31 | Discharge: 2022-01-31 | Disposition: A | Discharge status: Home / Self Care | Payer: Medicare Other | Source: Ambulatory Visit | Attending: Family Medicine | Admitting: Family Medicine

## 2022-01-31 ENCOUNTER — Encounter (HOSPITAL_BASED_OUTPATIENT_CLINIC_OR_DEPARTMENT_OTHER): Payer: Self-pay | Admitting: Cardiology

## 2022-01-31 ENCOUNTER — Encounter (HOSPITAL_COMMUNITY): Payer: Self-pay | Admitting: Cardiology

## 2022-01-31 ENCOUNTER — Other Ambulatory Visit (HOSPITAL_COMMUNITY): Payer: Self-pay | Admitting: Cardiology

## 2022-01-31 ENCOUNTER — Ambulatory Visit (HOSPITAL_BASED_OUTPATIENT_CLINIC_OR_DEPARTMENT_OTHER)
Admission: RE | Admit: 2022-01-31 | Discharge: 2022-01-31 | Disposition: A | Discharge status: Home / Self Care | Payer: Medicare Other | Source: Ambulatory Visit | Attending: Cardiology | Admitting: Cardiology

## 2022-01-31 VITALS — BP 94/58 | HR 59 | Wt 210.2 lb

## 2022-01-31 DIAGNOSIS — I5022 Chronic systolic (congestive) heart failure: Secondary | ICD-10-CM

## 2022-01-31 DIAGNOSIS — I130 Hypertensive heart and chronic kidney disease with heart failure and stage 1 through stage 4 chronic kidney disease, or unspecified chronic kidney disease: Secondary | ICD-10-CM | POA: Insufficient documentation

## 2022-01-31 DIAGNOSIS — Z79899 Other long term (current) drug therapy: Secondary | ICD-10-CM | POA: Diagnosis not present

## 2022-01-31 DIAGNOSIS — Z7901 Long term (current) use of anticoagulants: Secondary | ICD-10-CM | POA: Insufficient documentation

## 2022-01-31 DIAGNOSIS — N183 Chronic kidney disease, stage 3 unspecified: Secondary | ICD-10-CM | POA: Insufficient documentation

## 2022-01-31 DIAGNOSIS — I493 Ventricular premature depolarization: Secondary | ICD-10-CM | POA: Insufficient documentation

## 2022-01-31 DIAGNOSIS — Z95810 Presence of automatic (implantable) cardiac defibrillator: Secondary | ICD-10-CM | POA: Insufficient documentation

## 2022-01-31 DIAGNOSIS — I420 Dilated cardiomyopathy: Secondary | ICD-10-CM | POA: Insufficient documentation

## 2022-01-31 DIAGNOSIS — N62 Hypertrophy of breast: Secondary | ICD-10-CM | POA: Insufficient documentation

## 2022-01-31 DIAGNOSIS — I4819 Other persistent atrial fibrillation: Secondary | ICD-10-CM | POA: Insufficient documentation

## 2022-01-31 DIAGNOSIS — I2510 Atherosclerotic heart disease of native coronary artery without angina pectoris: Secondary | ICD-10-CM | POA: Diagnosis not present

## 2022-01-31 LAB — ECHOCARDIOGRAM COMPLETE
AR max vel: 2.6 cm2
AV Area VTI: 2.62 cm2
AV Area mean vel: 2.71 cm2
AV Mean grad: 4 mmHg
AV Peak grad: 6.8 mmHg
Ao pk vel: 1.3 m/s
Area-P 1/2: 2.32 cm2
MV M vel: 4.22 m/s
MV Peak grad: 71.2 mmHg
P 1/2 time: 954 msec
S' Lateral: 4 cm

## 2022-01-31 MED ORDER — ATORVASTATIN CALCIUM 80 MG PO TABS: 80.0000 mg | ORAL_TABLET | Freq: Every day | ORAL | 3 refills | Status: DC

## 2022-01-31 NOTE — Progress Notes (Incomplete)
Echocardiogram 2D Echocardiogram has been performed.  Lucendia Herrlich 01/31/2022, 9:07 AM

## 2022-01-31 NOTE — Progress Notes (Signed)
Patient ID: Terry [name], male   DOB: 1955-06-09, 66 y.o.   MRN: [id number] Primary Care: Shirline Frees, MD Primary Cardiologist: Fransico Him, MD HF Cardiologist: Dr Aundra Dubin  HPI: Terry [name] is a 66 y.o. 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 Back in 2006, an echo showed EF 15-20%.  LHC at that time showed normal coronaries.  He has been managed for cardiomyopathy since that time. In 12/14, EF had improved to 50%.  However, he has had a decline since then.  Last echo in 5/16 showed EF 25%.  Atrial fibrillation was first noted in 2013.  In 7/15, he had DCCV to NSR.  In 1/16, he was noted to be back in atrial fibrillation and appears to have been in atrial fibrillation since that time. He had St Jude ICD placed by Dr Lovena Le in 6/16.  Given fall in EF, Lexiscan Cardiolite was done. This showed EF 39% with partially reversible inferior and apical perfusion defect. He had LHC in 7/16 showing no significant CAD.  In 8/16, he was admitted for Tikosyn initiation and converted to NSR.  Echo in 8/18 showed EF 35-40%, mild to moderately decreased RV systolic function.  Echo in 8/19 showed EF 40-45%, mild-moderate MR, mild to moderately decreased RV systolic function.   Holter in 8/19 showed 8% PVCs.   He had right TKR in [id number] without complications.   Echo in 10/20 showed EF stable at 40-45%. Echo in 12/21 showed EF 35-40%, moderate RV enlargement with normal systolic function, 4.2 cm ascending aorta.   Echo in 12/22 showed EF 40% with mild LV dilation, mild RV dilation, normal RV systolic function, mild-moderate MR.   Patient had calcium score in 10/23, 3129 Agatston units (98th percentile).  He was sent up for cardiac PET, still pending.   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  He got rid of the [place] and was given a course of prednisone, coughing resolved.   Echo was  done today and reviewed, EF 35%, global hypokinesis, mildly decreased RV systolic function, mild MR, mild AI, normal IVC.   He presents today for followup of CHF.  He is no longer coughing and is off prednisone.  Symptomatically doing well, no chest pain or exertional dyspnea.  He exercises several times/week at the gym and swims laps as well. No lightheadedness.  No chest pain. Planning to go on trip to [place] and [place] later in May.  Has right shoulder pain from rotator cuff injury.  No orthopnea/PND.  No palpitations.   ECG (personally reviewed): NSR, PVC, LAFB with septal Qs, QTc 493  St Jude device interrogation: no VT, stable thoracic impedance.   HIV negative, ferritin normal, immunofixation with no monoclonal protein.  Labs (6/16): K 4.2, Creatinine 1.08, HCT 37.5 Labs (08/18/14): K 4.2, Creatinine 1.0 Labs (8/16): K 4.7, creatinine 1.18 Labs (9/16): Mg 2.1, creatinine 1.16, K 4.6 Labs (6/18): K 4.4, creatinine 1.25 Labs (9/18): K 4.4, creatinine 1.24 Labs (12/18): K 4.1, creatinine 1.2, hgb 11.6 Labs (10/19): K 4.3, creatinine 1.0 Labs (3/21): K 4.7, creatinine 1.52 Labs (9/21): K 4.6, creaetinine 1.57 Labs (12/21): K 4.4, creatinine 1.75 Labs (2/22): LDL 58 Labs (7/22): K 4.5, creatinine 1.55 Labs (12/22): LDL 60, creatinine 1.53 Labs (10/23): LDL 64, Lp(a) 26, LDL particle number [id number], K 4.3, creatinine 1.37  PMH 1. Nonischemic dilated cardiomyopathy: Echo (2006) with EF 15-20%.  LHC in 10/06 showed normal coronaries.  By 2014, EF had improved to 50%.  Echo in 1/16 showed EF 35-40%.  Echo (5/16) showed EF 25% with mild MR.  St Jude ICD 6/16.  HIV negative, immunofixation with no monoclonal protein, and ferritin normal.  Lexiscan Cardiolite (7/16) with EF 39% and partially reversible inferior and apical perfusion defect (intermediate risk).   LHC (7/16) with EF 35-40%, no significant CAD.  - Echo (8/17): EF 45-50% - Echo (8/18): EF 35-40%, moderate diastoilc dysfunction, mild  AI, mild MR, mild to moderately decreased RV systolic function.   - Echo (8/19): EF 40-45%, mild-moderate MR, mild to moderately decreased RV systolic function.  - Echo (10/20): EF 40-45%, mild LVH, normal RV, mild MR.  - Echo (12/21): EF 35-40%, moderate RV enlargement with normal systolic function, 4.2 cm ascending aorta.  - Echo (12/22): EF 40% with mild LV dilation, mild RV dilation, normal RV systolic function, mild-moderate MR. - Echo (12/23): EF 35%, global hypokinesis, mildly decreased RV systolic function, mild MR, mild AI, normal IVC. 2. Atrial fibrillation: Paroxysmal.  First noted in 2013.  DCCV in 7/15.  Back in atrial fibrillation 1/16.  Converted back to NSR with Tikosyn in 8/16.  3. HTN 4. Rheumatoid Arthritis: Has been off all medications for several years.   5. H/o bilateral THR 6. Gynecomastia with spironolactone.  7. PVCs: Holter in 8/19 showed 8% PVCs.  8. Ascending aortic aneurysm: 4.2 cm ascending aorta on 12/21 echo.  - CT chest (7/22) with 4.1 cm ascending aorta - CT chest (10/23) with 4.2 can ascending aorta.  9. CAD: Calcium score in 10/23, 3129 Agatston units (98th percentile).  10. Hypersensitivity pneumonitis: Down and parakeet.  Resolved with prednisone and avoidance of triggers.   SH: Lives at home with wife and 2 children and a grandchild. Never smoker, drinks 1 or 2 beers a week with dinner. 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   FH: Father with MI at 40, brother with atrial fibrillation, mother with renal failure.   Review of systems complete and found to be negative unless listed in HPI.    Current Outpatient Medications  Medication Sig Dispense Refill   carvedilol (COREG) 25 MG tablet Take 1 tablet (25 mg total) by mouth 2 (two) times daily. 180 tablet 3   dapagliflozin propanediol (FARXIGA) 10 MG TABS tablet Take 1 tablet (10 mg total) by mouth daily before breakfast. 90 tablet 3   dofetilide (TIKOSYN) 250 MCG capsule TAKE 1  CAPSULE (250 MCG TOTAL) BY MOUTH 2 TIMES DAILY. PLEASE CALL FOR OFFICE VISIT [phone] 60 capsule 11   furosemide (LASIX) 40 MG tablet Take 1 tablet (40 mg total) by mouth daily. 90 tablet 3   multivitamin-iron-minerals-folic acid (CENTRUM) chewable tablet Chew 1 tablet by mouth daily.     rivaroxaban (XARELTO) 20 MG TABS tablet Take 1 tablet (20 mg total) by mouth daily with supper. 90 tablet 3   sacubitril-valsartan (ENTRESTO) 97-103 MG Take 1 tablet by mouth 2 (two) times daily. 180 tablet 3   atorvastatin (LIPITOR) 80 MG tablet Take 1 tablet (80 mg total) by mouth daily. 90 tablet 3   eplerenone (INSPRA) 50 MG tablet TAKE 1 TABLET BY MOUTH EVERY DAY 90 tablet 1   No current facility-administered medications for this encounter.   Allergies  Allergen Reactions   Other Other (See Comments)   Spironolactone     Gynecomastia   Sulfa Antibiotics     Thrush and hives  Vitals:   01/31/22 0914  BP: (!) 94/58  Pulse: (!) 59  SpO2: 99%  Weight: 95.3 kg (210 lb 3.2 oz)    Wt Readings from Last 3 Encounters:  01/31/22 95.3 kg (210 lb 3.2 oz)  01/03/22 93.4 kg (206 lb)  12/06/21 95.9 kg (211 lb 6.4 oz)    PHYSICAL EXAM: General: NAD Neck: No JVD, no thyromegaly or thyroid nodule.  Lungs: Clear to auscultation bilaterally with normal respiratory effort. CV: Nondisplaced PMI.  Heart regular S1/S2, no S3/S4, no murmur.  No peripheral edema.  No carotid bruit.  Normal pedal pulses.  Abdomen: Soft, nontender, no hepatosplenomegaly, no distention.  Skin: Intact without lesions or rashes.  Neurologic: Alert and oriented x 3.  Psych: Normal affect. Extremities: No clubbing or cyanosis.  HEENT: Normal.   ASSESSMENT & PLAN: 1. Chronic systolic HF: Nonischemic cardiomyopathy x years.  He has a Secondary school teacher ICD and is not a candidate for CRT due to narrow QRS.  7/16 LHC showed no significant CAD.  Echo today showed EF 35% with mildly decreased RV systolic function.  This is fairly stable.  NYHA  class I-II symptoms with good exercise tolerance, not volume overloaded by exam or [place].  - Continue dapagliflozin 10 mg daily.    - Continue Coreg 25 mg bid.  - Continue Entresto 97/103 bid.   - Gynecomastia resolved off spironolactone. Continue eplerenone 50 mg daily. BMET today.  2.  Atrial fibrillation: Paroxysmal.  Holding NSR on Tikosyn.  QTc ok on ECG today.  - BMET and Mg today.  - Continue Xarelto 20 mg daily. 3. PVCs: Holter in 8/19 showed 8% PVCs.  This is unlikely to affect his cardiomyopathy.  Still with occasional PVCs, denies palpitations.  4. CKD: Stage 3.   - Continue dapagliflozin, BMET today.  5. Dilated ascending aorta: 4.2 cm on CT chest in 10/23.  6. Hypersensitivity pneumonitis: Cough resolved after getting rid of down around his house and getting rid of parakeet.  7. CAD: Calcium score 3129, 98th percentile.  He is asymptomatic with no chest pain or exertional dyspnea.    - Increase atorvastatin to 80 mg daily with lipids/LFTs in 2 months (goal LDL < 55).  - Cardiac PET scan has been ordered to look for ischemia.   Marca Ancona, MD  01/31/2022

## 2022-01-31 NOTE — Patient Instructions (Signed)
INCREASE Atorvastatin to 80mg  daily   Blood work in 2 months  Your physician recommends that you schedule a follow-up appointment in: 3 months  If you have any questions or concerns before your next appointment please send a message through [place] or call our office at [phone].    TO LEAVE A MESSAGE FOR THE NURSE SELECT OPTION 2, PLEASE LEAVE A MESSAGE INCLUDING: YOUR NAME DATE OF BIRTH CALL BACK NUMBER REASON FOR CALL**this is important as we prioritize the call backs  YOU WILL RECEIVE A CALL BACK THE SAME DAY AS LONG AS YOU CALL BEFORE 4:00 PM  At the [facility], you and your health needs are our priority. As part of our continuing mission to provide you with exceptional heart care, we have created designated Provider Care Teams. These Care Teams include your primary Cardiologist (physician) and Advanced Practice Providers (APPs- Physician Assistants and Nurse Practitioners) who all work together to provide you with the care you need, when you need it.   You may see any of the following providers on your designated Care Team at your next follow up: Dr [phone] Dr Arvilla Meres Dr. Marca Ancona, NP Marcos Eke, Robbie Lis [facility] [place], [place], NP Brynda Peon, PharmD   Please be sure to bring in all your medications bottles to every appointment.

## 2022-02-07 DIAGNOSIS — M19011 Primary osteoarthritis, right shoulder: Secondary | ICD-10-CM | POA: Diagnosis not present

## 2022-02-09 ENCOUNTER — Telehealth (HOSPITAL_COMMUNITY): Payer: Self-pay | Admitting: *Deleted

## 2022-02-09 NOTE — Telephone Encounter (Signed)
Patient returning call regarding his upcoming cardiac imaging study; pt verbalizes understanding of appt date/time, parking situation and where to check in, pre-test NPO status and verified current allergies; name and call back number provided for further questions should they arise  Larey Brick RN Navigator Cardiac Imaging [facility] [phone] office [phone] cell  Patient aware to avoid caffeine 12 hour prior to his cardiac PET scan.

## 2022-02-09 NOTE — Telephone Encounter (Signed)
Attempted to call patient regarding upcoming cardiac PET appointment. Left message on voicemail with name and callback number  Samie Reasons RN Navigator Cardiac Imaging [facility] [phone] Office [phone] Cell  Reminder for patient to avoid caffeine 12 hours prior to his cardiac PET scan. 

## 2022-02-10 DIAGNOSIS — J0100 Acute maxillary sinusitis, unspecified: Secondary | ICD-10-CM | POA: Diagnosis not present

## 2022-02-14 ENCOUNTER — Encounter (HOSPITAL_COMMUNITY)
Admission: RE | Admit: 2022-02-14 | Discharge: 2022-02-14 | Disposition: A | Discharge status: Home / Self Care | Payer: Medicare Other | Source: Ambulatory Visit | Attending: Cardiology | Admitting: Cardiology

## 2022-02-14 ENCOUNTER — Other Ambulatory Visit: Payer: Self-pay

## 2022-02-14 ENCOUNTER — Encounter (HOSPITAL_BASED_OUTPATIENT_CLINIC_OR_DEPARTMENT_OTHER): Payer: Self-pay | Admitting: Cardiology

## 2022-02-14 ENCOUNTER — Telehealth: Payer: Self-pay | Admitting: Cardiology

## 2022-02-14 ENCOUNTER — Emergency Department (HOSPITAL_COMMUNITY)
Admission: EM | Admit: 2022-02-14 | Discharge: 2022-02-14 | Disposition: A | Discharge status: Home / Self Care | Payer: Medicare Other | Attending: Emergency Medicine | Admitting: Emergency Medicine

## 2022-02-14 DIAGNOSIS — I5020 Unspecified systolic (congestive) heart failure: Secondary | ICD-10-CM | POA: Diagnosis not present

## 2022-02-14 DIAGNOSIS — Z96653 Presence of artificial knee joint, bilateral: Secondary | ICD-10-CM | POA: Diagnosis not present

## 2022-02-14 DIAGNOSIS — E785 Hyperlipidemia, unspecified: Secondary | ICD-10-CM | POA: Insufficient documentation

## 2022-02-14 DIAGNOSIS — Z96642 Presence of left artificial hip joint: Secondary | ICD-10-CM | POA: Diagnosis not present

## 2022-02-14 DIAGNOSIS — I420 Dilated cardiomyopathy: Secondary | ICD-10-CM

## 2022-02-14 DIAGNOSIS — R000 Tachycardia, unspecified: Secondary | ICD-10-CM | POA: Diagnosis present

## 2022-02-14 DIAGNOSIS — Z7901 Long term (current) use of anticoagulants: Secondary | ICD-10-CM | POA: Insufficient documentation

## 2022-02-14 DIAGNOSIS — I4891 Unspecified atrial fibrillation: Secondary | ICD-10-CM | POA: Diagnosis not present

## 2022-02-14 DIAGNOSIS — Z79899 Other long term (current) drug therapy: Secondary | ICD-10-CM | POA: Diagnosis not present

## 2022-02-14 DIAGNOSIS — I110 Hypertensive heart disease with heart failure: Secondary | ICD-10-CM | POA: Insufficient documentation

## 2022-02-14 DIAGNOSIS — R931 Abnormal findings on diagnostic imaging of heart and coronary circulation: Secondary | ICD-10-CM | POA: Insufficient documentation

## 2022-02-14 LAB — NM PET CT CARDIAC PERFUSION MULTI W/ABSOLUTE BLOODFLOW
LV dias vol: 95 mL (ref 62–150)
LV sys vol: 79 mL
MBFR: 2.37
Nuc Rest EF: 28 %
Nuc Stress EF: 17 %
Rest MBF: 0.84 ml/g/min
Rest Nuclear Isotope Dose: 24.7 mCi
ST Depression (mm): 0 mm
Stress MBF: 1.99 ml/g/min
Stress Nuclear Isotope Dose: 24.7 mCi
TID: 0.9

## 2022-02-14 LAB — BASIC METABOLIC PANEL
Anion gap: 12 (ref 5–15)
BUN: 23 mg/dL (ref 8–23)
CO2: 24 mmol/L (ref 22–32)
Calcium: 8.7 mg/dL — ABNORMAL LOW (ref 8.9–10.3)
Chloride: 100 mmol/L (ref 98–111)
Creatinine, Ser: 1.36 mg/dL — ABNORMAL HIGH (ref 0.61–1.24)
GFR, Estimated: 57 mL/min — ABNORMAL LOW (ref >60)
Glucose, Bld: 146 mg/dL — ABNORMAL HIGH (ref 70–99)
Potassium: 3.9 mmol/L (ref 3.5–5.1)
Sodium: 136 mmol/L (ref 135–145)

## 2022-02-14 LAB — CBC WITH DIFFERENTIAL/PLATELET
Abs Immature Granulocytes: 0.05 10*3/uL (ref 0.00–0.07)
Basophils Absolute: 0.1 10*3/uL (ref 0.0–0.1)
Basophils Relative: 1 %
Eosinophils Absolute: 0.8 10*3/uL — ABNORMAL HIGH (ref 0.0–0.5)
Eosinophils Relative: 7 %
HCT: 37.3 % — ABNORMAL LOW (ref 39.0–52.0)
Hemoglobin: 12.1 g/dL — ABNORMAL LOW (ref 13.0–17.0)
Immature Granulocytes: 0 %
Lymphocytes Relative: 16 %
Lymphs Abs: 2 10*3/uL (ref 0.7–4.0)
MCH: 28.4 pg (ref 26.0–34.0)
MCHC: 32.4 g/dL (ref 30.0–36.0)
MCV: 87.6 fL (ref 80.0–100.0)
Monocytes Absolute: 1.1 10*3/uL — ABNORMAL HIGH (ref 0.1–1.0)
Monocytes Relative: 10 %
Neutro Abs: 8 10*3/uL — ABNORMAL HIGH (ref 1.7–7.7)
Neutrophils Relative %: 66 %
Platelets: 235 10*3/uL (ref 150–400)
RBC: 4.26 MIL/uL (ref 4.22–5.81)
RDW: 14.4 % (ref 11.5–15.5)
WBC: 12 10*3/uL — ABNORMAL HIGH (ref 4.0–10.5)
nRBC: 0 % (ref 0.0–0.2)

## 2022-02-14 LAB — MAGNESIUM: Magnesium: 2.2 mg/dL (ref 1.7–2.4)

## 2022-02-14 MED ORDER — SACUBITRIL-VALSARTAN 97-103 MG PO TABS
1.0000 | ORAL_TABLET | Freq: Once | ORAL | Status: DC
  Filled 2022-02-14: qty 1

## 2022-02-14 MED ORDER — REGADENOSON 0.4 MG/5ML IV SOLN
INTRAVENOUS | Status: AC
  Administered 2022-02-14: 0.4 mg via INTRAVENOUS
  Filled 2022-02-14: qty 5

## 2022-02-14 MED ORDER — RUBIDIUM RB82 GENERATOR (RUBYFILL)
24.7000 | PACK | Freq: Once | INTRAVENOUS | Status: AC
  Administered 2022-02-14: 24.7 via INTRAVENOUS

## 2022-02-14 MED ORDER — CARVEDILOL 12.5 MG PO TABS: 25.0000 mg | ORAL_TABLET | Freq: Once | ORAL | Status: DC

## 2022-02-14 MED ORDER — REGADENOSON 0.4 MG/5ML IV SOLN: 0.4000 mg | Freq: Once | INTRAVENOUS | Status: AC

## 2022-02-14 NOTE — Progress Notes (Signed)
ED Charge Nurse called Mendel Ryder, RN) and notified of pt arrival and room was assigned. Pt was a/o and had no s/s of distress at time of arrival the the ED. Wife is at the bedside. VS and EKG assessed and pt was in NSR with HR of 74. Report given to nurse at bedside. Pt continue to denie SOB, chest pain, and Nausea and vomiting. Pt is continuing to monitored and treated per MD orders.

## 2022-02-14 NOTE — Telephone Encounter (Signed)
Epic shows that patient was discharged from ED at 10:58 AM. Was admitted for Afib RVR and palpitations during stress test.

## 2022-02-14 NOTE — Telephone Encounter (Signed)
Patient states he went in for his PET scan and his HR was elevated to the 150's. Patient states his HR has dropped down to 70-80 since then (currently at 76, BP is 112/68), but they are refusing to release him. Patient states he has already gone without his medication due to them refusing to release him. Patient would like to know if our office can contact [facility] to have him discharged.

## 2022-02-14 NOTE — Discharge Instructions (Addendum)
We evaluated you for your A-fib RVR.  By the time you arrived in the emergency department, your heart was in a normal rhythm.  We checked your electrolytes and they were normal.  Please follow-up with your cardiologist to go over the results of your stress test.  Please return to the emergency department if you develop any new symptoms such as palpitations or rapid heartbeat, fainting, dizziness, chest pain, difficulty breathing, or any other concerning symptoms.

## 2022-02-14 NOTE — ED Triage Notes (Signed)
Pt to ED from [facility] pt hr noted to be in the 180's in nuc med. Pt denies symptoms.

## 2022-02-14 NOTE — Progress Notes (Addendum)
Pt is alert, calm and pleasant and has no s/s of distress. Pt denies SOB, chest pain, or dysphoretic. Pt is reading AFIB on the monitor and pt has a hx of AFIB. Pt reports that he has no taken his Carvedilol or Entresto this am. Pt has defibrillator. Cola was given and no change in HR or rhythm.   Cardiologist (Dr. Gasper Sells) was notified by Judson Roch (Coordinator) and orders were given and was told to take pt to the Emergency Department for further evaluation. Will continue to monitor and tx pt according to MD orders. Pt has no s/s of of distress upon leaving for the Emergency Department.

## 2022-02-14 NOTE — ED Provider Notes (Signed)
[facility] Provider Note  CSN: [id number] Arrival date & time: 02/14/22 5027  Chief Complaint(s) Tachycardia  HPI Terry [name] is a 67 y.o. male with a history of CHF, A-fib on Xarelto, hypertension presenting to the emergency department with A-fib.  Patient had a stress test today for further workup of his coronary artery disease.  He reports that he developed palpitations during the stress test.  He was found to be in A-fib with RVR and transferred to the emergency department.  A-fib with RVR self resolved prior to arrival in the emergency department.  Patient has no complaints.  He has no symptoms such as nausea, vomiting, chest pain, shortness of breath, leg swelling, abdominal pain, headaches, or any other symptoms.   Past Medical History Past Medical History:  Diagnosis Date   Agatston coronary artery calcium score greater than 400    Cor Ca score 3129 on 11/2021   AICD (automatic cardioverter/defibrillator) present    Anemia    "S/P knee & hip OR"   Aortic atherosclerosis (HCC)    Aortic insufficiency    mild    Ascending aorta dilation (HCC)    37mm by echo and CT 01/2020 and CT 2023   Chronic systolic CHF (congestive heart failure), NYHA class 1 (HCC)    Complication of anesthesia    hx of irregular beat after anesthesia in ~ 1990's   GERD (gastroesophageal reflux disease)    H/O hematuria    H/O hiatal hernia    Hepatitis 1960's   "caught it from my brother"   History of blood transfusion 2013; 2014   S/P knee and hip OR   Hypertension    Nonischemic dilated cardiomyopathy (HCC)    EF 40-45% by echo 2019.  S/P AICD for primary prevention.  Repeat EF 01/2020 35-40%.    PAF (paroxysmal atrial fibrillation) (HCC)    "off and on since 2013" (08/05/2014)   PVC (premature ventricular contraction)    PVC load 8%   Rheumatoid arthritis (HCC) dx'd 1995   Patient Active Problem List   Diagnosis Date Noted   Agatston coronary artery  calcium score greater than 400 12/10/2021   Pulmonary infiltrates    Aortic atherosclerosis (HCC)    Ascending aortic aneurysm (HCC)    ICD (implantable cardioverter-defibrillator) in place 11/08/2018   PAF (paroxysmal atrial fibrillation) (HCC) 05/14/2018   PVC (premature ventricular contraction) 05/14/2018   Bradycardia with 31-40 beats per minute 12/01/2017   Status post total knee replacement, right 11/30/2017   Unilateral primary osteoarthritis, right knee 10/17/2017   S/P ICD (internal cardiac defibrillator) procedure 08/05/2014   Nonischemic dilated cardiomyopathy (HCC)    Hypertension    Aortic insufficiency    Persistent atrial fibrillation (HCC)    Chronic systolic heart failure (HCC)    Aortic root dilatation (HCC)    Urinary retention 12/01/2012   Degenerative arthritis of left knee 11/29/2012   Degenerative arthritis of hip 09/08/2011   Home Medication(s) Prior to Admission medications   Medication Sig Start Date End Date Taking? Authorizing Provider  atorvastatin (LIPITOR) 80 MG tablet Take 1 tablet (80 mg total) by mouth daily. 01/31/22   Laurey Morale, MD  carvedilol (COREG) 25 MG tablet Take 1 tablet (25 mg total) by mouth 2 (two) times daily. 04/26/21   Quintella Reichert, MD  dapagliflozin propanediol (FARXIGA) 10 MG TABS tablet Take 1 tablet (10 mg total) by mouth daily before breakfast. 04/26/21   Quintella Reichert, MD  dofetilide (  TIKOSYN) 250 MCG capsule TAKE 1 CAPSULE (250 MCG TOTAL) BY MOUTH 2 TIMES DAILY. PLEASE CALL FOR OFFICE VISIT [phone] 04/26/21   Quintella Reichert, MD  eplerenone (INSPRA) 50 MG tablet TAKE 1 TABLET BY MOUTH EVERY DAY 01/31/22   Laurey Morale, MD  furosemide (LASIX) 40 MG tablet Take 1 tablet (40 mg total) by mouth daily. 04/26/21   Quintella Reichert, MD  multivitamin-iron-minerals-folic acid (CENTRUM) chewable tablet Chew 1 tablet by mouth daily.    [provider]  rivaroxaban (XARELTO) 20 MG TABS tablet Take 1 tablet (20 mg  total) by mouth daily with supper. 04/26/21   Turner, Cornelious Bryant, MD  sacubitril-valsartan (ENTRESTO) 97-103 MG Take 1 tablet by mouth 2 (two) times daily. 04/26/21   Quintella Reichert, MD                                                                                                                                    Past Surgical History Past Surgical History:  Procedure Laterality Date   BRONCHIAL WASHINGS  10/05/2020   Procedure: BRONCHIAL WASHINGS;  Surgeon: Kalman Shan, MD;  Location: [facility];  Service: Endoscopy;;   CARDIAC CATHETERIZATION  ~ 2004   normal   CARDIAC CATHETERIZATION N/A 09/07/2014   Procedure: Left Heart Cath and Coronary Angiography;  Surgeon: Laurey Morale, MD;  Location: [facility] INVASIVE CV LAB;  Service: Cardiovascular;  Laterality: N/A;   CARDIAC DEFIBRILLATOR PLACEMENT  08/05/2014   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    JOINT REPLACEMENT     NASAL SEPTUM SURGERY  ~ 1975   TONSILLECTOMY     TOTAL HIP ARTHROPLASTY  09/08/2011   Procedure: TOTAL HIP ARTHROPLASTY ANTERIOR APPROACH;  Surgeon: [name], MD;  Location: [facility];  Service: Orthopedics;  Laterality: Right;  Right total hip replacement, left knee steroid injection   TOTAL KNEE ARTHROPLASTY Left 11/29/2012   Procedure: LEFT TOTAL KNEE ARTHROPLASTY;  Surgeon: [name], MD;  Location: [facility];  Service: Orthopedics;  Laterality: Left;  FEMORAL NERVE BLOCK IN HOLDING AREA LEFT LEG   TOTAL KNEE ARTHROPLASTY Right 11/30/2017   Procedure: RIGHT TOTAL KNEE ARTHROPLASTY;  Surgeon: [name], MD;  Location: [facility];  Service: Orthopedics;  Laterality: Right;   URETHRAL FISTULA REPAIR  ~ 1996   URETHRAL STRICTURE DILATATION  "several times"   VIDEO BRONCHOSCOPY N/A 10/05/2020   Procedure: VIDEO BRONCHOSCOPY WITHOUT FLUORO;  Surgeon: Kalman Shan, MD;  Location: [facility];  Service: Endoscopy;  Laterality: N/A;   Family History Family History  Problem Relation Age of Onset   Heart disease Father    Heart attack Father 66   Heart failure Brother     Social History Social History   Tobacco Use   Smoking status: Never   Smokeless tobacco: Never  Vaping Use   Vaping Use: Never used  Substance Use Topics   Alcohol use: Yes    Alcohol/week: 1.0 standard drink of alcohol    Types: 1 Cans of beer per week   Drug use: No   Allergies Other, Spironolactone, and Sulfa antibiotics  Review of Systems Review of Systems  All other systems reviewed and are negative.   Physical Exam Vital Signs  I have reviewed the triage vital signs BP 112/68   Pulse 66   Temp 98 F (36.7 C)   Resp 13   Ht 5\' 11"  (1.803 m)   Wt 95.3 kg   SpO2 97%   BMI 29.30 kg/m  Physical Exam Vitals and nursing note reviewed.  Constitutional:      General: He is not in acute distress.    Appearance: Normal appearance.  HENT:     Mouth/Throat:     Mouth: Mucous membranes are moist.  Eyes:     Conjunctiva/sclera: Conjunctivae normal.  Cardiovascular:     Rate and Rhythm: Normal rate and regular rhythm.  Pulmonary:     Effort: Pulmonary effort is normal. No respiratory distress.     Breath sounds: Normal breath sounds.  Abdominal:     General: Abdomen is flat.     Palpations: Abdomen is soft.     Tenderness: There is no abdominal tenderness.  Musculoskeletal:     Right lower leg: No edema.     Left lower leg: No edema.  Skin:    General: Skin is warm and dry.     Capillary Refill: Capillary refill takes less than 2 seconds.  Neurological:     Mental Status: He is  alert and oriented to person, place, and time. Mental status is at baseline.  Psychiatric:        Mood and Affect: Mood normal.        Behavior: Behavior normal.     ED Results and Treatments Labs (all labs ordered are listed, but only abnormal results are displayed) Labs Reviewed  BASIC METABOLIC PANEL - Abnormal; Notable for the following components:      Result Value   Glucose, Bld 146 (*)    Creatinine, Ser 1.36 (*)    Calcium 8.7 (*)    GFR, Estimated 57 (*)    All other components within normal limits  CBC WITH DIFFERENTIAL/PLATELET - Abnormal; Notable for the following components:   WBC 12.0 (*)    Hemoglobin 12.1 (*)    HCT 37.3 (*)    Neutro Abs 8.0 (*)    Monocytes Absolute 1.1 (*)    Eosinophils Absolute 0.8 (*)    All other components within normal limits  MAGNESIUM  Radiology No results found.  Pertinent labs & imaging results that were available during my care of the patient were reviewed by me and considered in my medical decision making (see MDM for details).  Medications Ordered in ED Medications - No data to display                                                                                                                                   Procedures Procedures  (including critical care time)  Medical Decision Making / ED Course   MDM:  67 year old male presenting to the emergency department with A-fib, now resolved.  Patient well-appearing, denies any complaints and his exam is reassuring.  A-fib likely triggered due to stress test.  He is currently without any symptoms.  He denies any chest pain or loss of consciousness during the stress test.  Electrolytes checked and reassuring.  Will discharge with further outpatient follow-up with his cardiologist to obtain results of stress testing which is not resulted at this time. Dr.  Gasper Sells aware of patient in ER and plan for discharge.      Lab Tests: -I ordered, reviewed, and interpreted labs.   The pertinent results include:   Labs Reviewed  BASIC METABOLIC PANEL - Abnormal; Notable for the following components:      Result Value   Glucose, Bld 146 (*)    Creatinine, Ser 1.36 (*)    Calcium 8.7 (*)    GFR, Estimated 57 (*)    All other components within normal limits  CBC WITH DIFFERENTIAL/PLATELET - Abnormal; Notable for the following components:   WBC 12.0 (*)    Hemoglobin 12.1 (*)    HCT 37.3 (*)    Neutro Abs 8.0 (*)    Monocytes Absolute 1.1 (*)    Eosinophils Absolute 0.8 (*)    All other components within normal limits  MAGNESIUM    Notable for reassuring electrolytes  EKG   EKG Interpretation  Date/Time:  Tuesday February 14 2022 09:39:42 EST Ventricular Rate:  94 PR Interval:  169 QRS Duration: 108 QT Interval:  474 QTC Calculation: 458 R Axis:   -58 Text Interpretation: Sinus rhythm Paired ventricular premature complexes Left anterior fascicular block Anteroseptal infarct, old Confirmed by Garnette Gunner [phone]) on 02/14/2022 10:24:59 AM          Medicines ordered and prescription drug management: Meds ordered this encounter  Medications   DISCONTD: sacubitril-valsartan (ENTRESTO) 97-103 mg per tablet    Order Specific Question:   ACE-inhibitors have NOT been administered in the past 36-hours.    Answer:   YES (confirmed by ordering provider)   DISCONTD: carvedilol (COREG) tablet 25 mg    -I have reviewed the patients home medicines and have made adjustments as needed  Cardiac Monitoring: The patient was maintained on a cardiac monitor.  I personally viewed and interpreted the cardiac monitored which showed an underlying rhythm of: NSR with frequent  PACs  Reevaluation: After the interventions noted above, I reevaluated the patient and found that they have resolved  Co morbidities that complicate the patient  evaluation  Past Medical History:  Diagnosis Date   Agatston coronary artery calcium score greater than 400    Cor Ca score 3129 on 11/2021   AICD (automatic cardioverter/defibrillator) present    Anemia    "S/P knee & hip OR"   Aortic atherosclerosis (HCC)    Aortic insufficiency    mild    Ascending aorta dilation (HCC)    19mm by echo and CT 01/2020 and CT 2023   Chronic systolic CHF (congestive heart failure), NYHA class 1 (HCC)    Complication of anesthesia    hx of irregular beat after anesthesia in ~ 1990's   GERD (gastroesophageal reflux disease)    H/O hematuria    H/O hiatal hernia    Hepatitis 1960's   "caught it from my brother"   History of blood transfusion 2013; 2014   S/P knee and hip OR   Hypertension    Nonischemic dilated cardiomyopathy (HCC)    EF 40-45% by echo 2019.  S/P AICD for primary prevention.  Repeat EF 01/2020 35-40%.    PAF (paroxysmal atrial fibrillation) (HCC)    "off and on since 2013" (08/05/2014)   PVC (premature ventricular contraction)    PVC load 8%   Rheumatoid arthritis (HCC) dx'd 1995      Dispostion: Disposition decision including need for hospitalization was considered, and patient discharged from emergency department.    Final Clinical Impression(s) / ED Diagnoses Final diagnoses:  Atrial fibrillation with RVR (HCC)     This chart was dictated using voice recognition software.  Despite best efforts to proofread,  errors can occur which can change the documentation meaning.    Lonell Grandchild, MD 02/14/22 [phone]

## 2022-02-15 ENCOUNTER — Telehealth: Payer: Self-pay

## 2022-02-15 ENCOUNTER — Encounter (HOSPITAL_BASED_OUTPATIENT_CLINIC_OR_DEPARTMENT_OTHER): Payer: Self-pay | Admitting: Cardiology

## 2022-02-15 ENCOUNTER — Encounter (HOSPITAL_COMMUNITY): Payer: Self-pay

## 2022-02-15 ENCOUNTER — Telehealth: Payer: Self-pay | Admitting: *Deleted

## 2022-02-15 NOTE — Telephone Encounter (Signed)
-----   Message from Traci R Turner, MD sent at 02/15/2022  9:50 AM EST ----- Please let patient know that I talked with Dr. McLean and we both agree that this study is actually low risk.  It shows a small defect in the front part of his heart but there is normal blood flow in the LAD that supplies blood to this area of the heart.  Recommend continuing medical therapy unless he is having CP ----- Message ----- From: McLean, Dalton S, MD Sent: 02/15/2022   9:49 AM EST To: Traci R Turner, MD  Got message from Terry [name] about his cardiac PET.  He had very mild anterolateral ischemia with normal flow reserve in LAD territory.  With no ischemic symptoms, think he does not need cath as long as you agree.     

## 2022-02-15 NOTE — Telephone Encounter (Signed)
   Pre-operative Risk Assessment    Patient Name: Terry [name]  DOB: January 15, 1956 MRN: [id number]      Request for Surgical Clearance    Procedure:   RIGHT REVERSE SHOULDER ARTHROPLASTY  Date of Surgery:  Clearance TBD                                 Surgeon:  DR. Victorino December Surgeon's Group or Practice Name:  [facility] Phone number:  [phone]  [facility] Fax number:  [phone]   Type of Clearance Requested:   - Medical  - Pharmacy:  Hold Rivaroxaban (Xarelto) NOT INDICATED HOW LONG   Type of Anesthesia:   CHOICE   Additional requests/questions:    Astrid Divine   02/15/2022, 12:50 PM

## 2022-02-15 NOTE — Telephone Encounter (Signed)
Reviewed results with patient. He states he is not experiencing any chest pain but verbalizes understanding to call in to office if he starts to notice intermittent or continuous chest pain, pressure or tightness, especially with exertion. Patient verbalizes understanding to call EMS/911 if he experiences severe continuous chest pain that does not get better with rest.

## 2022-02-15 NOTE — Telephone Encounter (Signed)
-----   Message from Sueanne Margarita, MD sent at 02/15/2022  9:50 AM EST ----- Please let patient know that I talked with Dr. Aundra Dubin and we both agree that this study is actually low risk.  It shows a small defect in the front part of his heart but there is normal blood flow in the LAD that supplies blood to this area of the heart.  Recommend continuing medical therapy unless he is having CP ----- Message ----- From: Larey Dresser, MD Sent: 02/15/2022   9:49 AM EST To: Sueanne Margarita, MD  Got message from Evie Lacks about his cardiac PET.  He had very mild anterolateral ischemia with normal flow reserve in LAD territory.  With no ischemic symptoms, think he does not need cath as long as you agree.

## 2022-02-16 NOTE — Telephone Encounter (Signed)
Patient with diagnosis of afib on Xarelto for anticoagulation.    Procedure: right reverse shoulder arthroplasty Date of procedure: TBD  CHA2DS2-VASc Score = 4  This indicates a 4.8% annual risk of stroke. The patient's score is based upon: CHF History: 1 HTN History: 1 Diabetes History: 0 Stroke History: 0 Vascular Disease History: 1 Age Score: 1 Gender Score: 0   CrCl 73mL/min using adjusted body weight Platelet count 235K  Per office protocol, patient can hold Xarelto for 3 days prior to procedure.    **This guidance is not considered finalized until pre-operative APP has relayed final recommendations.**

## 2022-02-17 NOTE — Telephone Encounter (Signed)
   Name: Terry [name]  DOB: [birth year]  MRN: [id number]   Primary Cardiologist: Fransico Him, MD  Chart reviewed as part of pre-operative protocol coverage. Patient was contacted 02/17/2022 in reference to pre-operative risk assessment for pending surgery as outlined below. Terry [name] was last seen on 11/17/21 by Dr. Cristopher Peru.    Since that day, Terry [name] has done well with 1 episode of atrial fibrillation that he was seen in the ED for that converted without any intervention spontaneously.  Terry [name] was contacted today by phone and he reported no recurrence of atrial fibrillation or new cardiac complaints.  He is able to complete 4 metabolic equivalents of activity at this time..  Therefore, based on ACC/AHA guidelines, the patient would be at acceptable risk for the planned procedure without further cardiovascular testing.  He was advised that if he develops new symptoms prior to surgery to contact our office to arrange for a follow-up visit, and he verbalized understanding.  Per office protocol, patient can hold Xarelto for 3 days prior to procedure.    I will route this recommendation to the requesting party via Epic fax function and remove from pre-op pool. Please call with questions.  Mable Fill, Marissa Nestle, NP 02/17/2022, 10:55 AM   .Starr Sinclair

## 2022-02-17 NOTE — Telephone Encounter (Signed)
Patient returned phone call regarding surgical clearance please see clearance note for further details.  Ambrose Pancoast, NP

## 2022-02-21 ENCOUNTER — Encounter: Payer: Self-pay | Admitting: Internal Medicine

## 2022-02-21 ENCOUNTER — Ambulatory Visit (INDEPENDENT_AMBULATORY_CARE_PROVIDER_SITE_OTHER): Payer: Medicare Other | Admitting: Internal Medicine

## 2022-02-21 ENCOUNTER — Ambulatory Visit: Payer: Medicare Other | Admitting: Internal Medicine

## 2022-02-21 VITALS — BP 110/64 | HR 72 | Temp 98.6°F | Ht 71.0 in | Wt 214.0 lb

## 2022-02-21 DIAGNOSIS — Z8739 Personal history of other diseases of the musculoskeletal system and connective tissue: Secondary | ICD-10-CM

## 2022-02-21 DIAGNOSIS — R918 Other nonspecific abnormal finding of lung field: Secondary | ICD-10-CM

## 2022-02-21 DIAGNOSIS — J672 Bird fancier's lung: Secondary | ICD-10-CM

## 2022-02-21 DIAGNOSIS — Z7729 Contact with and (suspected ) exposure to other hazardous substances: Secondary | ICD-10-CM

## 2022-02-21 DIAGNOSIS — J849 Interstitial pulmonary disease, unspecified: Secondary | ICD-10-CM | POA: Diagnosis not present

## 2022-02-21 NOTE — Progress Notes (Signed)
IOV 03/26/2020  Subjective:  Patient ID: Terry [name], male , DOB: 1955-06-30 , age 68 y.o. , MRN: [id number] , ADDRESS: [place] [address] PCP Shirline Frees, MD Patient Care Team: Shirline Frees, MD as PCP - General (Family Medicine) Sueanne Margarita, MD as PCP - Cardiology (Cardiology)  This Provider for this visit: Treatment Team:  Attending Provider: Brand Males, MD    03/26/2020 -   Chief Complaint  Patient presents with   Consult     HPI Terry [name] 66 y.o. -non-smoker but with a long history of rheumatoid arthritis.  He is not on any immunomodulators for over 15 years.  He feels his quality of life is better with the immunomodulators.  He keeps himself physically active.  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  He had a CT scan of the chest for this that showed nonspecific nodules.  I personally visualized this and agree with this.  Some of these are scattered groundglass opacities some of them are tree-in-bud.  Particularly in the right lower lobe and also in the esophageal recess.  He denies any postnasal drip or acid reflux.  He denies any shortness of breath.  Of note he has cough that is chronic for the last 1 year.  He has been on Entresto for a few years.  A year ago Dr. Loralie Champagne his cardiologist increase the [facility] and therefore his cough got worse.  His cough is rated at a scale of 3 out of 10 both early in the morning and late at night.  Early morning is particularly more.  Nevertheless overall the burden is mild.  There is no sputum production.  There is no shortness of breath or wheezing.    CT Chest data -January 28, 2020 personally visualized.  And agree with the findings.   IMPRESSION: 1. Ascending thoracic aorta approximately 4.2 cm maximal caliber. Recommend annual imaging followup by CTA or MRA. This recommendation follows 2010 ACCF/AHA/AATS/ACR/ASA/SCA/SCAI/SIR/STS/SVM Guidelines for the Diagnosis and  Management of Patients with Thoracic Aortic Disease. Circulation. 2010; 121: C947-S962. Aortic aneurysm NOS (ICD10-I71.9) 2. Scattered areas of tree-in-bud nodularity at the bases. More focal collection of nodules and ground-glass in the as ago esophageal recess. Findings are likely related to chronic infection. Largest nodule approximately 5 mm. Some of these areas show ground-glass features. Non-contrast chest CT at 3-6 months is recommended. If nodules persist and are stable at that time, consider additional non-contrast chest CT examinations at 2 and 4 years. This recommendation follows the consensus statement: Guidelines for Management of Incidental Pulmonary Nodules Detected on CT Images: From the Fleischner Society 2017; Radiology 2017; 284:228-243. 3. Cholelithiasis without evidence of acute cholecystitis. 4. Aortic atherosclerosis.   Aortic Atherosclerosis (ICD10-I70.0).     Electronically Signed   By: Zetta Bills M.D.  OV 09/21/2020  Subjective:  Patient ID: Terry [name], male , DOB: January 14, 1956 , age 64 y.o. , MRN: [id number] , ADDRESS: [place] [address] PCP Shirline Frees, MD Patient Care Team: Shirline Frees, MD as PCP - General (Family Medicine) Sueanne Margarita, MD as PCP - Cardiology (Cardiology)  This Provider for this visit: Treatment Team:  Attending Provider: Brand Males, MD    09/21/2020 -   Chief Complaint  Patient presents with   Follow-up    Pt states he has been doing okay since last visit and denies any concerns.   Follow-up mild chronic cough in the setting of Entresto Follow-up  history of rheumatoid arthritis not on immunomodulators Follow-up nonspecific CT scan infiltrates with multiple lung nodules -no shortness of breath  HPI Terry [name] 67 y.o. -returns for follow-up.  Since I last saw him in May 2022 he went to [place] and [place].  While in [place] mid May 2022 he developed mild COVID infection.  He recovered  from it.  He never had much symptoms.  He feels fine right now.  He just has mild cough because of Entresto.  No shortness of breath.  We went over the CT scan results showing waxing and waning tree-in-bud opacities particular in the lower lobe but overall increased.  Also slight increase in nodularity particularly 7 mm left lower lobe.  He is quite worried about this.  His wife indicated that he worries and is anxious quite a bit.  Explained to him in the setting of recent COVID this could be COVID-related accentuation of abnormalities and we could opt to follow-up in several months to see if things settle down.  He reflected on this.  After this we also discussed about the idea of having a bronchoscopy with lavage right now to rule out any MAI.  We went over the differential diagnosis of MAI, interstitial reticular abnormalities with potential to transform into ILD and rheumatoid nodules.  Based on this he wants to proceed with a bronchoscopy with lavage at this point to rule out any infection.  He is preparing to go to [place] in the end of September 2022.  Therefore he wants all his bases covered.   Risks of pneumothorax, hemothorax, sedation/anesthesia complications such as cardiac or respiratory arrest or hypotension, stroke and bleeding all explained. Benefits of diagnosis but limitations of non-diagnosis also explained. Patient verbalized understanding and wished to proceed.      CT Chest data July 2022    IMPRESSION: 1. Moderate patchy tree-in-bud opacities and scattered centrilobular nodules in the peripheral lungs bilaterally, most prominent at the lung bases, overall increased, although with some waxing and waning behavior. Minimal cylindrical bronchiolectasis at the lung bases. Findings are most compatible with a nonspecific chronic or recurrent infectious or inflammatory bronchiolitis, with the differential including atypical mycobacterial infection (MAI) or  recurrent aspiration. 2. The largest new pulmonary nodule measures 7 mm in the posterior left lower lobe, favor inflammatory. Suggest attention on follow-up chest CT in 6-12 months. 3. 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 Recommend annual imaging followup by CTA or MRA. This recommendation follows 2010 ACCF/AHA/AATS/ACR/ASA/SCA/SCAI/SIR/STS/SVM Guidelines for the Diagnosis and Management of Patients with Thoracic Aortic Disease. Circulation. 2010; 121: V425-Z563. Aortic aneurysm NOS (ICD10-I71.9). 4. Three-vessel coronary atherosclerosis. 5. Cholelithiasis. 6. Aortic Atherosclerosis (ICD10-I70.0).     Electronically Signed   By: Ilona Sorrel M.D.   On: 08/17/2020 11:40   Video visit Sept 2022  History of Present Illness: 67 year old male seen for pulmonary consult March 26, 2020 for abnormal CT chest with pulmonary nodules and chronic cough Medical history significant for rheumatoid arthritis, chronic systolic congestive heart failure due to nonischemic cardiomyopathy status post [facility] ICD June 2016.  (Echo 2006 EF 15 to 20%), A. Fib COVID infection May 2022 reported as mild symptoms  Today's video visit is a 6-week follow-up.  Patient is being followed for abnormal CT chest with scattered pulmonary nodules.  And chronic cough in the setting of Entresto.  Patient does have congestive heart failure/cardiomyopathy.  Recent CT chest done August 17, 2020 showed patchy tree-in-bud opacities and scattered nodules maximum at 7 mm.  And some minimal bronchiectasis.  Patient was set up for bronchoscopy with lavage.  This was done on October 05, 2020 airway exam showed minimum clear secretions, normal mucosa and no visible endobronchial lesions.  BAL culture was negative with normal respiratory flora, AFB culture was negative, pneumocystis jiroveci Ag negative, fungal culture negative, cytology identified no malignant cells, benign bronchial cells and pulmonary  macrophages. Patient says overall his breathing is doing okay.  He remains very active.  O2 saturations at home remain around 98% on room air.  Patient swims at least 45 minutes each day.  Denies any significant cough. We reviewed his culture reports.   OV 12/06/2020  Subjective:  Patient ID: Terry [name], male , DOB: [birth year] , age 25 y.o. , MRN: [id number] , ADDRESS: [place] [address] PCP Shirline Frees, MD Patient Care Team: Shirline Frees, MD as PCP - General (Family Medicine) Sueanne Margarita, MD as PCP - Cardiology (Cardiology)  This Provider for this visit: Treatment Team:  Attending Provider: Brand Males, MD    12/06/2020 -   Chief Complaint  Patient presents with   Follow-up    No c/o, follow up on Super D CT    HPI returns for follow Terry [name] 67 y.o. -returns for follow-up.  Presents with his wife.  He just came back from a trip to [place] in [place].  He was walking 5 to 6 miles a day.  Felt really good.  He had bronchoscopy with lavage in August 2022 before this trip and the cultures are negative.  This was reviewed by nurse practitioner.  He now had a follow-up CT scan of the chest that shows significant improvement.  This is reviewed below.  He has no new complaints.  He is up-to-date with his respiratory vaccines.    CT Chest data 12/01/20  IMPRESSION: Decreased size of the LEFT lower lobe pulmonary nodule now 2 mm compatible with a resolving infectious or inflammatory changes.   Patchy tree-in-bud nodularity and subtle ground-glass seen on the previous exam also improved and compatible with improving appearance of sequela of chronic infection.   Three-vessel coronary artery disease.   Mild aneurysmal dilation of the ascending thoracic aorta 4.1 cm is similar to the prior study. Recommend annual imaging followup by CTA or MRA. This recommendation follows 2010 ACCF/AHA/AATS/ACR/ASA/SCA/SCAI/SIR/STS/SVM Guidelines for  the Diagnosis and Management of Patients with Thoracic Aortic Disease. Circulation. 2010; 121: M841-L244. Aortic aneurysm NOS (ICD10-I71.9)   Cholelithiasis.   Aortic Atherosclerosis (ICD10-I70.0).     Electronically Signed   By: Zetta Bills M.D.   On: 12/02/2020 08:45  OV 12/06/2021  Subjective:  Patient ID: Terry [name], male , DOB: 1956/01/19 , age 61 y.o. , MRN: [id number] , ADDRESS: [place] [address] PCP Shirline Frees, MD Patient Care Team: Shirline Frees, MD as PCP - General (Family Medicine) Sueanne Margarita, MD as PCP - Cardiology (Cardiology)  This Provider for this visit: Treatment Team:  Attending Provider: Brand Males, MD   12/06/2021 -   Chief Complaint  Patient presents with   Follow-up    Follow-up visit PT states no problems with breathing     HPI Terry [name] 67 y.o. - presents with wife. ROV is a year to date. He is doing well. Minimal symptoms. Had sinus infection a month ago and Rx with doxy. In May 2023 went to [place] and [place]. NExt may 2024 plans to go to [place].Overall well  other  than mild cough early in the moerning attributed to entrerstoe. He does stationary boke an has no dyspnea.  However, on exam there was rLL crackles and on CT - Dr Weber Cooks feels nodularity is worse than last year and features fit in with HP. I also think there is aRLL intestritual infiltrate that is new since last year. So, asking about exposures- he has had 2parakeets x 20 years in kitchen. Wife cleans them but he sits near them in the mornings. Occasinally he cleans them. He also has some down jackets which he likes veyr much  BAL last year showed 25% lymphocytes     Narrative & Impression  CLINICAL DATA:  67 year old male with history of pulmonary nodules. Follow-up study.   EXAM: CT CHEST WITHOUT CONTRAST   TECHNIQUE: Multidetector CT imaging of the chest was performed following the standard protocol without  IV contrast.   RADIATION DOSE REDUCTION: This exam was performed according to the departmental dose-optimization program which includes automated exposure control, adjustment of the mA and/or kV according to patient size and/or use of iterative reconstruction technique.   COMPARISON:  Chest CT 12/01/2020.   FINDINGS: Cardiovascular: Heart size is normal. There is no significant pericardial fluid, thickening or pericardial calcification. There is aortic atherosclerosis, as well as atherosclerosis of the great vessels of the mediastinum and the coronary arteries, including calcified atherosclerotic plaque in the left main, left anterior descending, left circumflex and right coronary arteries. Left-sided pacemaker/AICD with lead tip terminating in the right ventricular apex.   Mediastinum/Nodes: No pathologically enlarged mediastinal or hilar lymph nodes. Please note that accurate exclusion of hilar adenopathy is limited on noncontrast CT scans. Esophagus is unremarkable in appearance. No axillary lymphadenopathy.   Lungs/Pleura: Diffuse bronchial wall thickening, mild thickening of the peribronchovascular interstitium and widespread centrilobular ground-glass attenuation micro nodularity scattered throughout the lungs bilaterally, overall, increased compared to the prior examination. No other definite larger more suspicious appearing pulmonary nodules or masses are noted. No confluent consolidative airspace disease. No pleural effusions.   Upper Abdomen: Numerous calcified gallstones in the gallbladder measuring up to 1.7 cm in diameter. Gallbladder is nearly completely collapsed in otherwise unremarkable in appearance. Atherosclerosis in the abdominal aorta.   Musculoskeletal: There are no aggressive appearing lytic or blastic lesions noted in the visualized portions of the skeleton.   IMPRESSION: 1. 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 Alternatively, mild atypical infection could be considered in the appropriate clinical setting. Further clinical evaluation is recommended. 2. Aortic atherosclerosis, in addition to left main and three-vessel coronary artery disease. Please note that although the presence of coronary artery calcium documents the presence of coronary artery disease, the severity of this disease and any potential stenosis cannot be assessed on this non-gated CT examination. Assessment for potential risk factor modification, dietary therapy or pharmacologic therapy may be warranted, if clinically indicated. 3. Cholelithiasis.   Aortic Atherosclerosis (ICD10-I70.0).     Electronically Signed   By: Vinnie Langton M.D.   On: 12/02/2021 07:59   NOv 2023 OV with APP   History of Present Illness: 67 year old male followed for abnormal CT chest with pulmonary nodularity and chronic cough Medical history significant for rheumatoid arthritis, chronic systolic congestive heart failure due to nonischemic cardiomyopathy status post [facility] ICD June 2016 Echo 2006 EF 15 to 20%), A. Fib   Today's video visit is a 1 month follow-up.  Patient was seen last visit for a an exacerbation of hypersensitivity  pneumonitis. Presented with increased cough , sinus drainage , and dyspnea.  Patient history positive for long-term exposure to down coats and parakeets in his home.  Patient was recommended to remove all birds from his home.  And was started on a slow prednisone taper.  He is currently on prednisone 21m daily. Has 10 days left of taper.  Lab work showed negative QuantiFERON gold, allergy panel was positive for dust, trees, IgE 86. Elevated absolute eosinophils at 900 .  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   He has gotten rid of all down clothing and pillows  and all birds are gone. Cleaned the house he is feeling much better with resolution of cough . Dyspnea is gone. Going to gym twice a week without any issues. Was able to yard work.    OV 02/21/2022  Subjective:  Patient ID: BWanda [name] male , DOB: [birth year], age 67y.o. , MRN: [id number], [id number] HShirline Frees MD Patient Care Team: HShirline Frees MD as PCP - General (Family Medicine) TSueanne Margarita MD as PCP - Cardiology (Cardiology)  This Provider for this visit: Treatment Team:  Attending Provider: RBrand Males MD    02/21/2022 -   Chief Complaint  Patient presents with   Follow-up    Doing well,  Did get birds out of home.  PFT today review    Follow-up mild chronic cough in the setting of Entresto Follow-up history of rheumatoid arthritis not on immunomodulators Follow-up nonspecific CT scan infiltrates with multiple lung nodules -no shortness of breath - July 2022  - bronch bal - neg aug 2022. BAL lymphocyte 25%  - CT improved - oct 2022  -High-resolution CT chest October 2023 raise the specter of hypersensitive pneumonitis Follow-up RAST allergy panel + October 2023 Mild covi [place] HPI Terry [place] [name] 66 y.o. -returns for follow-up.  Since his last visit with me we presented him at the multidisciplinary case conference in November 2023.  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  Question was aspiration.  I did talk to him.  He said in the past when he used to eat fast used to choke on food but he has not had this problem for a while.  Because he eats and chews his food slowly.  In the interim he is gotten rid of all organic antigen exposure and this actually helped him.  He does not have any residual cough.  He is not short of breath he is doing swimming and yard  work.  He finished the prednisone taper I gave him that this also actually helped him.  He is not having any sinus drainage or wheezing.  He did do the ILD questionnaire and it is as below.  [place] Integrated Comprehensive ILD Questionnaire  Symptoms:   SYMPTOM SCALE - ILD 02/21/2022  Current weight   O2 use ra  Shortness of Breath 0 -> 5 scale with 5 being worst (score 6 If unable to do)  At rest 00  Simple tasks - showers, clothes change, eating, shaving 0  Household (dishes, doing bed, laundry) 00  Shopping 0  Walking level at own pace 0  Walking up Stairs 0  Total (30-36) Dyspnea Score 0  Non-dyspnea symptoms (0-> 5 scale) 02/21/2022  How bad is your cough? 00  How bad is your fatigue 0  How bad is nausea 0  How bad is vomiting?  0  How bad is diarrhea? 0  How bad is anxiety? 00  How bad is depression 0  Any chronic pain - if so where and how bad 0     Past Medical History :  -As below   ROS:  Arthralgia  FAMILY HISTORY of LUNG DISEASE:  No family history of lung disease  PERSONAL EXPOSURE HISTORY:  -His parents smoked when he was a child and there was passive smoking.  [place] the [place] the last 22 years.  He did have pet bird and he got rid of it after the last visit.  He did get rid of feather pillow/Dubay.  He does do some gardening.  He is retired currently.  He did have some down jackets  -No smoking marijuana or cocaine. HOME  EXPOSURE and HOBBY DETAILS :  x  OCCUPATIONAL HISTORY (122 questions) : -Detail organic and inorganic antigen history is negative  PULMONARY TOXICITY HISTORY (27 items):  BAL. lymphocytosis in the past  INVESTIGATIONS: Multidisciplinary case conference [phone] 2023 notes:  According to Dr. Burt Ek radiologist CT most recent - lot of micronodules. More pronoucned in Lower lungs but also diffuse across both lungs . tree in bud +. No HC. No TB. No AT. No mosaic attenuation.   Currently worse since 2021. Ddx is ASpiration, Follicular bronchiolitis. DR Burt Ek does not think is HP. It is not ILD protocol CT Per Dr Burt Ek  get ILD protocol to look for air trapping. Per Dr Erin Fulling ask for aspiration. Cliniccally this is HP but radiologically not sure. Short course steroids ok        Latest Ref Rng & Units 02/21/2022    2:12 PM  PFT Results  FVC-Predicted Pre % 97  P  Pre FEV1/FVC % % 76  P  FEV1-Pre L 3.52  P  FEV1-Predicted Pre % 99  P  DLCO uncorrected ml/min/mmHg 20.09  P  DLCO UNC% % 73  P  DLCO corrected ml/min/mmHg 21.79  P  DLCO COR %Predicted % 79  P  DLVA Predicted % 73  P    P Preliminary result         has a past medical history of [place] coronary artery calcium score greater than 400, AICD (automatic cardioverter/defibrillator) present, Anemia, Aortic atherosclerosis ([place]), Aortic insufficiency, Ascending aorta dilation ([facility]), Chronic systolic CHF (congestive heart failure), NYHA class 1 ([place]), Complication of anesthesia, GERD (gastroesophageal reflux disease), H/O hematuria, H/O hiatal hernia, Hepatitis (1960's), History of blood transfusion (2013; 2014), Hypertension, Nonischemic dilated cardiomyopathy ([place]), PAF (paroxysmal atrial fibrillation) ([place]), PVC (premature ventricular contraction), and Rheumatoid arthritis ([place]) (dx'd 1995).   reports that he has never smoked. He has never used smokeless tobacco.  Past Surgical History:  Procedure Laterality Date   BRONCHIAL WASHINGS  10/05/2020   Procedure: BRONCHIAL WASHINGS;  Surgeon: Brand Males, MD;  Location: [facility];  Service: Endoscopy;;   CARDIAC CATHETERIZATION  ~ 2004   normal   CARDIAC CATHETERIZATION N/A 09/07/2014   Procedure: Left Heart Cath and Coronary Angiography;  Surgeon: Larey Dresser, MD;  Location: [place] [facility];  Service: Cardiovascular;  Laterality: N/A;   CARDIAC DEFIBRILLATOR PLACEMENT  08/05/2014   St. 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    JOINT REPLACEMENT     NASAL SEPTUM SURGERY  ~ [place]  09/08/2011   Procedure: TOTAL HIP ARTHROPLASTY ANTERIOR APPROACH;  Surgeon: Mcarthur Rossetti, MD;  Location: [facility];  Service: Orthopedics;  Laterality: Right;  Right total hip replacement, left knee steroid injection   TOTAL KNEE ARTHROPLASTY Left 11/29/2012   Procedure: LEFT TOTAL KNEE ARTHROPLASTY;  Surgeon: Mcarthur Rossetti, MD;  Location: [facility];  Service: Orthopedics;  Laterality: Left;  FEMORAL NERVE BLOCK IN HOLDING AREA LEFT LEG   TOTAL KNEE ARTHROPLASTY Right 11/30/2017   Procedure: RIGHT TOTAL KNEE ARTHROPLASTY;  Surgeon: Mcarthur Rossetti, MD;  Location: [facility];  Service: Orthopedics;  Laterality: Right;   URETHRAL FISTULA REPAIR  ~ [place]  "several times"   VIDEO BRONCHOSCOPY N/A 10/05/2020   Procedure: VIDEO BRONCHOSCOPY WITHOUT FLUORO;  Surgeon: Brand Males, MD;  Location: [facility];  Service: Endoscopy;  Laterality: N/A;    Allergies  Allergen Reactions   Other Other (See Comments)   Spironolactone     Gynecomastia   Sulfa Antibiotics     Thrush and hives    Immunization History  Administered Date(s) Administered   COVID-19, mRNA, vaccine(Comirnaty)12 years and older 12/15/2021   Fluad Quad(high Dose 65+) 10/10/2021   Influenza Split 11/30/2008, 10/26/2011, 10/18/2013, 08/06/2014, 10/11/2014, 10/22/2015, 09/30/2016, 09/28/2018, 09/28/2019   Influenza,inj,Quad PF,6+ Mos 09/30/2016, 10/06/2017, 09/28/2018    Influenza-Unspecified 10/17/2020   Moderna Covid-19 Vaccine Bivalent Booster 24yr & up 12/15/2021   PFIZER Comirnaty(Gray Top)Covid-19 Tri-Sucrose Vaccine 05/12/2020   PFIZER(Purple Top)SARS-COV-2 Vaccination 04/30/2019, 05/21/2019, 11/23/2019   Pfizer Covid-19 Vaccine Bivalent Booster 136yr& up 10/24/2020, 06/14/2021   Pneumococcal Polysaccharide-23 08/06/2014   Respiratory Syncytial Virus Vaccine,Recomb Aduvanted(Arexvy) 10/15/2021   Td 12/20/2016   Tdap 11/26/2006   Zoster Recombinat (Shingrix) 10/26/2018, 12/28/2018   Zoster, Live 12/14/2015, 10/26/2018, 12/28/2018    Family History  Problem Relation Age of Onset   Heart disease Father    Heart attack Father [id number] Heart failure Brother      Current Outpatient Medications:    atorvastatin (LIPITOR) 80 MG tablet, Take 1 tablet (80 mg total) by mouth daily., Disp: 90 tablet, Rfl: 3   carvedilol (COREG) 25 MG tablet, Take 1 tablet (25 mg total) by mouth 2 (two) times daily., Disp: 180 tablet, Rfl: 3   dapagliflozin propanediol (FARXIGA) 10 MG TABS tablet, Take 1 tablet (10 mg total) by mouth daily before breakfast., Disp: 90 tablet, Rfl: 3   dofetilide (TIKOSYN) 250 MCG capsule, TAKE 1 CAPSULE (250 MCG TOTAL) BY MOUTH 2 TIMES DAILY. PLEASE CALL FOR OFFICE VISIT [phone]: 60 capsule, Rfl: 11   eplerenone (INSPRA) 50 MG tablet, TAKE 1 TABLET BY MOUTH EVERY DAY, Disp: 90 tablet, Rfl: 1   furosemide (LASIX) 40 MG tablet, Take 1 tablet (40 mg total) by mouth daily., Disp: 90 tablet, Rfl: 3   multivitamin-iron-minerals-folic acid (CENTRUM) chewable tablet, Chew 1 tablet by mouth daily., Disp: , Rfl:    rivaroxaban (XARELTO) 20 MG TABS tablet, Take 1 tablet (20  mg total) by mouth daily with supper., Disp: 90 tablet, Rfl: 3   sacubitril-valsartan (ENTRESTO) 97-103 MG, Take 1 tablet by mouth 2 (two) times daily., Disp: 180 tablet, Rfl: 3      Objective:   Vitals:   02/21/22 1427  BP: 110/64  Pulse: 72  Temp: 98.6 F (37 C)   TempSrc: Oral  SpO2: 97%  Weight: 214 lb (97.1 kg)  Height: _0  (1.803 m)    Estimated body mass index is 29.85 kg/m as calculated from the following:   Height as of this encounter: _1  (1.803 m).   Weight as of this encounter: 214 lb (97.1 kg).  _2 @  Filed Weights   02/21/22 1427  Weight: 214 lb (97.1 kg)     Physical Exam    General: No distress. Looks wwell Neuro: Alert and Oriented x 3. GCS 15. Speech normal Psych: Pleasant Resp:  Barrel Chest - no.  Wheeze - no, Crackles - no, No overt respiratory distress CVS: Normal heart sounds. 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 PEERL +. No post nasal drip        Assessment:       ICD-10-CM   1. 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 Multiple lung nodules on CT  R91.8     3. ILD (interstitial lung disease) ([place])  J84.9     4. Long-term exposure involving bird droppings  Z77.29     5. History of rheumatoid arthritis  Z87.39          Plan:     Patient Instructions     ICD-10-CM   1. 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 Multiple lung nodules on CT  R91.8     3. ILD (interstitial lung disease) ([place])  J84.9     4. Long-term exposure involving bird droppings  Z77.29     5. History of rheumatoid arthritis  Z87.39       Glad you are feeling normal after completely getting rid of down jackets and also birds from the indoor environment and after completing steroid course.  Current lung function is essentially normal.  Physical exam is normal.   Plan -  -Best approach is serial monitoring  -Repeat spirometry and DLCO in 4-5 months  -Get high-resolution CT chest supine and prone with inspiratory and expiratory volume in October/November 2024  Follow-up -4-5 months after completing spirometry and DLCO   -ILD symptom at follow-up   (Level 04: Estb 30-39 min nt in total care time and counseling  or/and coordination of care by this undersigned MD - Dr Brand Males. This includes one or more of the following on this same day 02/21/2022: pre-charting, chart review, note writing, documentation discussion of test results, diagnostic or treatment recommendations, prognosis, risks and benefits of management options, instructions, education, compliance or risk-factor reduction. It excludes time spent by the [place] or office staff in the care of the patient . Actual time is 33 min)   SIGNATURE    Dr. Brand Males, M.D., F.C.C.P,  Pulmonary and Critical Care Medicine Staff Physician, [place] Director - Interstitial Lung Disease  Program  [facility] [place], [place], [address]  Pager: [phone], If no answer or between  15:00h - 7:00h: call [phone] Telephone: [phone]  3:09 PM 02/21/2022

## 2022-02-21 NOTE — Patient Instructions (Signed)
Spirometry/Diffusion Capacity performed today.   

## 2022-02-21 NOTE — Progress Notes (Signed)
Spirometry/Diffusion Capacity performed today.   

## 2022-02-21 NOTE — Patient Instructions (Addendum)
ICD-10-CM   1. 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 Multiple lung nodules on CT  R91.8     3. ILD (interstitial lung disease) ([place])  J84.9     4. Long-term exposure involving bird droppings  Z77.29     5. History of rheumatoid arthritis  Z87.39       Glad you are feeling normal after completely getting rid of down jackets and also birds from the indoor environment and after completing steroid course.  Current lung function is essentially normal.  Physical exam is normal.   Plan -  -Best approach is serial monitoring  -Repeat spirometry and DLCO in 4-5 months  -Get high-resolution CT chest supine and prone with inspiratory and expiratory volume in October/November 2024  Follow-up -4-5 months after completing spirometry and DLCO   -ILD symptom at follow-up

## 2022-02-24 LAB — PULMONARY FUNCTION TEST
DL/VA % pred: 73 %
DL/VA: 3.03 ml/min/mmHg/L
DLCO cor % pred: 79 %
DLCO cor: 21.79 ml/min/mmHg
DLCO unc % pred: 73 %
DLCO unc: 20.09 ml/min/mmHg
FEF 25-75 Pre: 2.82 L/sec
FEF2575-%Pred-Pre: 102 %
FEV1-%Pred-Pre: 99 %
FEV1-Pre: 3.52 L
FEV1FVC-%Pred-Pre: 102 %
FEV6-%Pred-Pre: 102 %
FEV6-Pre: 4.61 L
FEV6FVC-%Pred-Pre: 105 %
FVC-%Pred-Pre: 97 %
Pre FEV1/FVC ratio: 76 %
Pre FEV6/FVC Ratio: 100 %

## 2022-02-27 ENCOUNTER — Ambulatory Visit: Payer: Medicare Other | Attending: Cardiology

## 2022-02-27 DIAGNOSIS — E7800 Pure hypercholesterolemia, unspecified: Secondary | ICD-10-CM

## 2022-02-27 DIAGNOSIS — Z5181 Encounter for therapeutic drug level monitoring: Secondary | ICD-10-CM | POA: Diagnosis not present

## 2022-02-27 DIAGNOSIS — R931 Abnormal findings on diagnostic imaging of heart and coronary circulation: Secondary | ICD-10-CM | POA: Diagnosis not present

## 2022-02-28 LAB — NMR, LIPOPROFILE
Cholesterol, Total: 95 mg/dL — ABNORMAL LOW (ref 100–199)
HDL Particle Number: 24.1 umol/L — ABNORMAL LOW (ref >=30.5)
HDL-C: 31 mg/dL — ABNORMAL LOW (ref >39)
LDL Particle Number: 701 nmol/L (ref <1000)
LDL Size: 19.7 nm — ABNORMAL LOW (ref >20.5)
LDL-C (NIH Calc): 43 mg/dL (ref 0–99)
LP-IR Score: 50 — ABNORMAL HIGH (ref <=45)
Small LDL Particle Number: 561 nmol/L — ABNORMAL HIGH (ref <=527)
Triglycerides: 112 mg/dL (ref 0–149)

## 2022-02-28 LAB — HEMOGLOBIN A1C
Est. average glucose Bld gHb Est-mCnc: 128 mg/dL
Hgb A1c MFr Bld: 6.1 % — ABNORMAL HIGH (ref 4.8–5.6)

## 2022-03-07 ENCOUNTER — Ambulatory Visit: Payer: Medicare Other | Attending: Internal Medicine

## 2022-03-07 DIAGNOSIS — I420 Dilated cardiomyopathy: Secondary | ICD-10-CM | POA: Diagnosis not present

## 2022-03-07 LAB — CUP PACEART REMOTE DEVICE CHECK
Battery Remaining Longevity: 29 mo
Battery Remaining Percentage: 29 %
Battery Voltage: 2.81 V
Brady Statistic RV Percent Paced: 1.3 %
Date Time Interrogation Session: 20240123020017
HighPow Impedance: 75 Ohm
HighPow Impedance: 75 Ohm
Implantable Lead Connection Status: 753985
Implantable Lead Implant Date: 20160622
Implantable Lead Location: 753860
Implantable Lead Model: 181
Implantable Lead Serial Number: 331169
Implantable Pulse Generator Implant Date: 20160622
Lead Channel Impedance Value: 360 Ohm
Lead Channel Pacing Threshold Amplitude: 0.75 V
Lead Channel Pacing Threshold Pulse Width: 0.5 ms
Lead Channel Sensing Intrinsic Amplitude: 7.1 mV
Lead Channel Setting Pacing Amplitude: 2.5 V
Lead Channel Setting Pacing Pulse Width: 0.5 ms
Lead Channel Setting Sensing Sensitivity: 0.5 mV
Pulse Gen Serial Number: 7280026
Zone Setting Status: 755011

## 2022-03-11 NOTE — Patient Instructions (Signed)
SURGICAL WAITING ROOM VISITATION Patients having surgery or a procedure may have no more than 2 support people in the waiting area - these visitors may rotate in the visitor waiting room.   Due to an increase in RSV and influenza rates and associated hospitalizations, children ages 75 and under may not visit patients in [facility] hospitals. If the patient needs to stay at the hospital during part of their recovery, the visitor guidelines for inpatient rooms apply.  PRE-OP VISITATION  Pre-op nurse will coordinate an appropriate time for 1 support person to accompany the patient in pre-op.  This support person may not rotate.  This visitor will be contacted when the time is appropriate for the visitor to come back in the pre-op area.  Please refer to the [facility] website for the visitor guidelines for Inpatients (after your surgery is over and you are in a regular room).  You are not required to quarantine at this time prior to your surgery. However, you must do this: Hand Hygiene often Do NOT share personal items Notify your provider if you are in close contact with someone who has COVID or you develop fever 100.4 or greater, new onset of sneezing, cough, sore throat, shortness of breath or body aches.  If you test positive for Covid or have been in contact with anyone that has tested positive in the last 10 days please notify you surgeon.    Your procedure is scheduled on:  Friday   March 24, 2022  Report to [facility] Main Entrance: [place] entrance where the [employer] is available.   Report to admitting at: 05:15 AM  +++++Call this number if you have any questions or problems the morning of surgery [phone]  Do not eat food after Midnight the night prior to your surgery/procedure.  After Midnight you may have the following liquids until   04:15 AM DAY OF SURGERY  Clear Liquid Diet Water Black Coffee (sugar ok, NO MILK/CREAM OR CREAMERS)  Tea (sugar ok, NO  MILK/CREAM OR CREAMERS) regular and decaf                             Plain Jell-O  with no fruit (NO RED)                                           Fruit ices (not with fruit pulp, NO RED)                                     Popsicles (NO RED)                                                                  Juice: apple, WHITE grape, WHITE cranberry Sports drinks like Gatorade or Powerade (NO RED)                   The day of surgery:  Drink ONE (1) Pre-Surgery Clear Ensure at 04:15 AM the morning of surgery. Drink in one sitting. Do not sip.  This drink was given to you during your hospital pre-op appointment visit. Nothing else to drink after completing the Pre-Surgery Clear Ensure : No candy, chewing gum or throat lozenges.    FOLLOW ANY ADDITIONAL PRE OP INSTRUCTIONS YOU RECEIVED FROM YOUR SURGEON'S OFFICE!!!   Oral Hygiene is also important to reduce your risk of infection.        Remember - BRUSH YOUR TEETH THE MORNING OF SURGERY WITH YOUR REGULAR TOOTHPASTE  Take ONLY these medicines the morning of surgery with A SIP OF WATER: Carvedilol (Coreg), Dofetilde (Tikosyn).                  You may not have any metal on your body including jewelry, and body piercing  Do not wear  lotions, powders, cologne, or deodorant  Men may shave face and neck.  Patients discharged on the day of surgery will not be allowed to drive home.  Someone NEEDS to stay with you for the first 24 hours after anesthesia.  Do not bring your home medications to the hospital. The Pharmacy will dispense medications listed on your medication list to you during your admission in the Hospital.  Please read over the following fact sheets you were given: IF YOU HAVE QUESTIONS ABOUT YOUR PRE-OP INSTRUCTIONS, PLEASE CALL [phone]  ([place])   [place] - Preparing for Surgery Before surgery, you can play an important role.  Because skin is not sterile, your skin needs to be as free of germs as possible.  You  can reduce the number of germs on your skin by washing with CHG (chlorahexidine gluconate) soap before surgery.  CHG is an antiseptic cleaner which kills germs and bonds with the skin to continue killing germs even after washing. Please DO NOT use if you have an allergy to CHG or antibacterial soaps.  If your skin becomes reddened/irritated stop using the CHG and inform your nurse when you arrive at Short Stay. Do not shave (including legs and underarms) for at least 48 hours prior to the first CHG shower.  You may shave your face/neck.  Please follow these instructions carefully:  1.  Shower with CHG Soap the night before surgery and the  morning of surgery.  2.  If you choose to wash your hair, wash your hair first as usual with your normal  shampoo.  3.  After you shampoo, rinse your hair and body thoroughly to remove the shampoo.                             4.  Use CHG as you would any other liquid soap.  You can apply chg directly to the skin and wash.  Gently with a scrungie or clean washcloth.  5.  Apply the CHG Soap to your body ONLY FROM THE NECK DOWN.   Do not use on face/ open                           Wound or open sores. Avoid contact with eyes, ears mouth and genitals (private parts).                       Wash face,  Genitals (private parts) with your normal soap.             6.  Wash thoroughly, paying special attention to the area where your  surgery  will be  performed.  7.  Thoroughly rinse your body with warm water from the neck down.  8.  DO NOT shower/wash with your normal soap after using and rinsing off the CHG Soap.            9.  Pat yourself dry with a clean towel.            10.  Wear clean pajamas.            11.  Place clean sheets on your bed the night of your first shower and do not  sleep with pets.  ON THE DAY OF SURGERY : Do not apply any lotions/deodorants the morning of surgery.  Please wear clean clothes to the hospital/surgery center.   Preparing for Total  Shoulder Arthroplasty ================================================================= Please follow these instructions carefully, in addition to any other special Bathing information that was explained to you at the Presurgical Appointment:  BENZOYL PEROXIDE 5% GEL: Used to kill bacteria on the skin which could cause an infection at the surgery site.   Please do not use if you have an allergy to benzoyl peroxide. If your skin becomes reddened/irritated stop using the benzoyl peroxide and inform your Doctor.   Starting two days before surgery, apply as follows:  1. Apply benzoyl peroxide gel in the morning and at night. Apply after taking a shower. If you are not taking a shower, clean entire shoulder front, back, and side, along with the armpit with a clean wet washcloth.  2. Place a quarter-sized dollop of the gel on your SHOULDER and rub in thoroughly, making sure to cover the front, back, and side of your shoulder, along with the armpit.   2 Days prior to Surgery      Wednesday  03-22-22 First Application  _______ Morning Second Application  _______ Night  Day Before Surgery           Thursday   03-23-22   First Application   ______ Morning  On the night before surgery, wash your entire body (except hair, face and private areas) with CHG Soap. THEN, rub in the LAST application of the Benzoyl Peroxide Gel on your shoulder.   3. On the Morning of Surgery wash your BODY AGAIN with CHG Soap (except hair, face and private areas)  4. DO NOT USE THE BENZOYL PEROXIDE GEL ON THE DAY OF YOUR SURGERY      FAILURE TO FOLLOW THESE INSTRUCTIONS MAY RESULT IN THE CANCELLATION OF YOUR SURGERY  PATIENT SIGNATURE_________________________________  NURSE SIGNATURE__________________________________  ________________________________________________________________________       Terry [name]    An incentive spirometer is a tool that can help keep your lungs clear and active. This tool  measures how well you are filling your lungs with each breath. Taking long deep breaths may help reverse or decrease the chance of developing breathing (pulmonary) problems (especially infection) following: A long period of time when you are unable to move or be active. BEFORE THE PROCEDURE  If the spirometer includes an indicator to show your best effort, your nurse or respiratory therapist will set it to a desired goal. If possible, sit up straight or lean slightly forward. Try not to slouch. Hold the incentive spirometer in an upright position. INSTRUCTIONS FOR USE  Sit on the edge of your bed if possible, or sit up as far as you can in bed or on a chair. Hold the incentive spirometer in an upright position. Breathe out normally. Place the mouthpiece in your mouth and  seal your lips tightly around it. Breathe in slowly and as deeply as possible, raising the piston or the ball toward the top of the column. Hold your breath for 3-5 seconds or for as long as possible. Allow the piston or ball to fall to the bottom of the column. Remove the mouthpiece from your mouth and breathe out normally. Rest for a few seconds and repeat Steps 1 through 7 at least 10 times every 1-2 hours when you are awake. Take your time and take a few normal breaths between deep breaths. The spirometer may include an indicator to show your best effort. Use the indicator as a goal to work toward during each repetition. After each set of 10 deep breaths, practice coughing to be sure your lungs are clear. If you have an incision (the cut made at the time of surgery), support your incision when coughing by placing a pillow or rolled up towels firmly against it. Once you are able to get out of bed, walk around indoors and cough well. You may stop using the incentive spirometer when instructed by your caregiver.  RISKS AND COMPLICATIONS Take your time so you do not get dizzy or light-headed. If you are in pain, you may need to  take or ask for pain medication before doing incentive spirometry. It is harder to take a deep breath if you are having pain. AFTER USE Rest and breathe slowly and easily. It can be helpful to keep track of a log of your progress. Your caregiver can provide you with a simple table to help with this. If you are using the spirometer at home, follow these instructions: [place] IF:  You are having difficultly using the spirometer. You have trouble using the spirometer as often as instructed. Your pain medication is not giving enough relief while using the spirometer. You develop fever of 100.5 F (38.1 C) or higher.                                                                                                    SEEK IMMEDIATE MEDICAL CARE IF:  You cough up bloody sputum that had not been present before. You develop fever of 102 F (38.9 C) or greater. You develop worsening pain at or near the incision site. MAKE SURE YOU:  Understand these instructions. Will watch your condition. Will get help right away if you are not doing well or get worse. Document Released: 06/12/2006 Document Revised: 04/24/2011 Document Reviewed: 08/13/2006 [facility] Patient Information 2014 [place], [place].

## 2022-03-11 NOTE — Progress Notes (Signed)
COVID Vaccine received:  []  No [x]  Yes Date of any COVID positive Test in last 90 days:  PCP - Shirline Frees, MD at [place] Cardiologist - Fransico Him, MD EP- Crissie Sickles, NP Pulmonology- Murali Ramaswamy,MD  Chest x-ray - 2016  epic EKG -  02-16-2022  epic Stress Test - 02-14-2022  epic ECHO - 01-31-2022  epic Cardiac Cath - 2016  [place] by Dr. Loralie Champagne.   epic  PCR screen: [x]  Ordered & Completed                      []   No Order but Needs PROFEND                      []   N/A for this surgery  Surgery Plan:  [x]  Ambulatory                            []  Outpatient in bed                            []  Admit  Anesthesia:    []  General  []  Spinal                           [x]   Choice []   MAC  Pacemaker / ICD device []  No [x]  Yes        Device order form faxed []  No    []   Yes      Faxed to: Patient has [employer] VR ICD- implanted 08-05-2014.  Last checked: 03-07-2022   Spinal Cord Stimulator:[x]  No []  Yes      (Remind patient to bring remote DOS) Other Implants:   History of Sleep Apnea? [x]  No []  Yes   CPAP used?- [x]  No []  Yes    Does the patient monitor blood sugar? []  No []  Yes  [x]  N/A  Last dose of SGLT-2 inhibitors- FARXIGA 10 mg for Heart and CKD,  SGLT-2 instructions:    Blood Thinner / Instructions:  Xarelto - may hold 3 days per Ambrose Pancoast NP 02-17-22  phone note Aspirin Instructions: none  ERAS Protocol Ordered: []  No  [x]  Yes PRE-SURGERY [x]  ENSURE  []  G2  Patient is to be NPO after: 04:15 am  Comments:   Activity level: Patient can / can not climb a flight of stairs without difficulty; []  No CP  []  No SOB, but would have ______   Patient can / can not perform ADLs without assistance.   Anesthesia review: NICM, CHF NYHA class l-ll, A.Fib- cardioversions & on Tikosyn, Xarelto, ICD (Dr. Lovena Le 08-05-2014), Has RA, and Ascending aortic aneurysm.  Patient denies shortness of breath, fever, cough and chest pain at PAT  appointment.  Patient verbalized understanding and agreement to the Pre-Surgical Instructions that were given to them at this PAT appointment. Patient was also educated of the need to review these PAT instructions again prior to his/her surgery.I reviewed the appropriate phone numbers to call if they have any and questions or concerns.

## 2022-03-13 ENCOUNTER — Other Ambulatory Visit: Payer: Self-pay

## 2022-03-13 ENCOUNTER — Encounter (HOSPITAL_COMMUNITY)
Admission: RE | Admit: 2022-03-13 | Discharge: 2022-03-13 | Disposition: A | Discharge status: Home / Self Care | Payer: Medicare Other | Source: Ambulatory Visit | Attending: Orthopedic Surgery | Admitting: Orthopedic Surgery

## 2022-03-13 ENCOUNTER — Encounter (HOSPITAL_COMMUNITY): Payer: Self-pay

## 2022-03-13 VITALS — BP 103/75 | HR 68 | Temp 98.7°F | Resp 16 | Ht 71.0 in | Wt 216.0 lb

## 2022-03-13 DIAGNOSIS — I2510 Atherosclerotic heart disease of native coronary artery without angina pectoris: Secondary | ICD-10-CM | POA: Diagnosis not present

## 2022-03-13 DIAGNOSIS — J984 Other disorders of lung: Secondary | ICD-10-CM | POA: Diagnosis not present

## 2022-03-13 DIAGNOSIS — Z01818 Encounter for other preprocedural examination: Secondary | ICD-10-CM

## 2022-03-13 DIAGNOSIS — Z01812 Encounter for preprocedural laboratory examination: Secondary | ICD-10-CM | POA: Insufficient documentation

## 2022-03-13 LAB — BASIC METABOLIC PANEL
Anion gap: 11 (ref 5–15)
BUN: 25 mg/dL — ABNORMAL HIGH (ref 8–23)
CO2: 26 mmol/L (ref 22–32)
Calcium: 9 mg/dL (ref 8.9–10.3)
Chloride: 99 mmol/L (ref 98–111)
Creatinine, Ser: 1.5 mg/dL — ABNORMAL HIGH (ref 0.61–1.24)
GFR, Estimated: 51 mL/min — ABNORMAL LOW (ref >60)
Glucose, Bld: 113 mg/dL — ABNORMAL HIGH (ref 70–99)
Potassium: 4.8 mmol/L (ref 3.5–5.1)
Sodium: 136 mmol/L (ref 135–145)

## 2022-03-13 LAB — CBC
HCT: 35.4 % — ABNORMAL LOW (ref 39.0–52.0)
Hemoglobin: 11.3 g/dL — ABNORMAL LOW (ref 13.0–17.0)
MCH: 27.9 pg (ref 26.0–34.0)
MCHC: 31.9 g/dL (ref 30.0–36.0)
MCV: 87.4 fL (ref 80.0–100.0)
Platelets: 288 10*3/uL (ref 150–400)
RBC: 4.05 MIL/uL — ABNORMAL LOW (ref 4.22–5.81)
RDW: 13.6 % (ref 11.5–15.5)
WBC: 11.1 10*3/uL — ABNORMAL HIGH (ref 4.0–10.5)
nRBC: 0 % (ref 0.0–0.2)

## 2022-03-14 ENCOUNTER — Telehealth: Payer: Self-pay

## 2022-03-14 LAB — SURGICAL PCR SCREEN
MRSA, PCR: POSITIVE — AB
Staphylococcus aureus: POSITIVE — AB

## 2022-03-14 NOTE — Telephone Encounter (Signed)
Received a medical clearance form from [facility] , requesting Cardiology clearance for the following procedure: Rt reverse shoulder arthoplasty. Medical clearance form was signed by Dr. Loralie Champagne and successfully faxed to [phone] on Tuesday, January 30,. Form will be scanned into patients chart.

## 2022-03-14 NOTE — Progress Notes (Signed)
Patient's PCR screen is positive for MSRA and STAPH. Appropriate notes have been placed on the patient's chart. This note has been routed to Dr.Rogers for review. The Patient's surgery is currently scheduled for: 03-24-2022 at [facility].  Leota Jacobsen, BSN, CVRN-BC   Pre-Surgical Testing Nurse [place]  [phone]

## 2022-03-17 ENCOUNTER — Encounter: Payer: Self-pay | Admitting: Internal Medicine

## 2022-03-17 NOTE — Progress Notes (Signed)
PERIOPERATIVE PRESCRIPTION FOR IMPLANTED CARDIAC DEVICE PROGRAMMING  Patient Information: Name:  Terry [name]  DOB:  1955/08/03  MRN:  [id number]    lanned Procedure:  reverse shoulder arthroplasty  Surgeon:  Dr. Stann Mainland  Date of Procedure:  03/24/2022  Cautery will be used.  Position during surgery:  unknown   Please send documentation back to:  [facility] (Fax # [phone])  Device Information:  Clinic EP Physician:  Cristopher Peru, MD   Device Type:  Defibrillator Manufacturer and Phone #:  [employer]: [phone] Pacemaker Dependent?:  No. Date of Last Device Check:  03/07/22 Normal Device Function?:  Yes.    Electrophysiologist's Recommendations:  Have magnet available. Provide continuous ECG monitoring when magnet is used or reprogramming is to be performed.  Procedure may interfere with device function.  Magnet should be placed over device during procedure.  Per Device Clinic Standing Orders, Wanda Plump, RN  1:46 PM 03/17/2022

## 2022-03-17 NOTE — Progress Notes (Signed)
Anesthesia Chart Review   Case: [id number] Date/Time: 03/24/22 0700   Procedure: REVERSE SHOULDER ARTHROPLASTY (Right: Shoulder) - 120   Anesthesia type: Choice   Pre-op diagnosis: Right shoulder osteoarthritis   Location: [facility] 08 / [facility]   Surgeons: Nicholes Stairs, MD       DISCUSSION:67 y.o. never smoker with h/o RA, HTN, nonischemic dilated cardiomyopathy, AICD in place, PAF, ILD, ascending aorta dilation, right shoulder OA scheduled for above procedure 03/24/2022 with Dr. Nicholes Stairs.   Per cardiology preoperative evaluation 02/17/2022, "Chart reviewed as part of pre-operative protocol coverage. Patient was contacted 02/17/2022 in reference to pre-operative risk assessment for pending surgery as outlined below. Terry [name] was last seen on 11/17/21 by Dr. Cristopher Peru.     Since that day, Terry [name] has done well with 1 episode of atrial fibrillation that he was seen in the ED for that converted without any intervention spontaneously.  Terry [name] was contacted today by phone and he reported no recurrence of atrial fibrillation or new cardiac complaints.  He is able to complete 4 metabolic equivalents of activity at this time..   Therefore, based on ACC/AHA guidelines, the patient would be at acceptable risk for the planned procedure without further cardiovascular testing.  He was advised that if he develops new symptoms prior to surgery to contact our office to arrange for a follow-up visit, and he verbalized understanding.   Per office protocol, patient can hold Xarelto for 3 days prior to procedure."  Pt reports last dose of Xarelto 03/20/2022.  VS: BP 103/75 Comment: right arm sitting  Pulse 68   Temp 37.1 C   Resp 16   Ht 5\' 11"  (1.803 m)   Wt 98 kg   SpO2 96%   BMI 30.13 kg/m   PROVIDERS: Terry Frees, MD is PCP   Cardiologist - Terry Him, MD   Pulmonology- Terry Ramaswamy,MD  LABS: Labs reviewed: Acceptable for surgery. (all labs ordered are  listed, but only abnormal results are displayed)  Labs Reviewed  SURGICAL PCR SCREEN - Abnormal; Notable for the following components:      Result Value   MRSA, PCR POSITIVE (*)    Staphylococcus aureus POSITIVE (*)    All other components within normal limits  BASIC METABOLIC PANEL - Abnormal; Notable for the following components:   Glucose, Bld 113 (*)    BUN 25 (*)    Creatinine, Ser 1.50 (*)    GFR, Estimated 51 (*)    All other components within normal limits  CBC - Abnormal; Notable for the following components:   WBC 11.1 (*)    RBC 4.05 (*)    Hemoglobin 11.3 (*)    HCT 35.4 (*)    All other components within normal limits     IMAGES:   EKG:   CV: Echo 01/31/2022 1. Left ventricular ejection fraction, by estimation, is 35%. The left  ventricle has moderately decreased function. The left ventricle  demonstrates global hypokinesis. Left ventricular diastolic parameters are  consistent with Grade I diastolic  dysfunction (impaired relaxation).   2. Right ventricular systolic function is mildly reduced. The right  ventricular size is normal. There is normal pulmonary artery systolic  pressure. The estimated right ventricular systolic pressure is 63.8 mmHg.   3. Left atrial size was mildly dilated.   4. The mitral valve is normal in structure. Mild mitral valve  regurgitation. No evidence of mitral stenosis.   5. The aortic valve is  tricuspid. Aortic valve regurgitation is mild. No  aortic stenosis is present.   6. Aortic dilatation noted. There is mild dilatation of the aortic root,  measuring 43 mm.   7. The inferior vena cava is normal in size with greater than 50%  respiratory variability, suggesting right atrial pressure of 3 mmHg.  Past Medical History:  Diagnosis Date   Agatston coronary artery calcium score greater than 400    Cor Ca score 3129 on 11/2021   AICD (automatic cardioverter/defibrillator) present 08/05/2014   St Jude Ellipse VR   Anemia     "S/P knee & hip OR"   Aortic atherosclerosis (HCC)    Aortic insufficiency    mild    Ascending aorta dilation (HCC)    67mm by echo and CT 01/2020 and CT 1478   Chronic systolic CHF (congestive heart failure), NYHA class 1 (HCC)    Complication of anesthesia    hx of irregular beat after anesthesia in ~ 1990's   GERD (gastroesophageal reflux disease)    H/O hematuria    H/O hiatal hernia    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  S/P AICD for primary prevention.  Repeat EF 01/2020 35-40%.    PAF (paroxysmal atrial fibrillation) ([place])    "off and on since 2013" (08/05/2014)   PVC (premature ventricular contraction)    PVC load 8%   Rheumatoid arthritis ([place]) dx'd 1995    Past Surgical History:  Procedure Laterality Date   BRONCHIAL WASHINGS  10/05/2020   Procedure: BRONCHIAL WASHINGS;  Surgeon: Brand Males, MD;  [facility];  Service: Endoscopy;;   CARDIAC CATHETERIZATION  ~ 2004   normal   CARDIAC CATHETERIZATION N/A 09/07/2014   Procedure: Left Heart Cath and Coronary Angiography;  Surgeon: Larey Dresser, MD;  Location: [facility];  Service: Cardiovascular;  Laterality: N/A;   CARDIAC DEFIBRILLATOR PLACEMENT  08/05/2014   St. Jude   CARDIOVERSION N/A 08/21/2013   Procedure: CARDIOVERSION;  Surgeon: Sueanne Margarita, MD;  Location: [facility];  Service: Cardiovascular;  Laterality: N/A;   COLONOSCOPY  2010; 2015   "polyps; no polyps"   ELBOW SURGERY Left 2021   Dr. 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    JOINT REPLACEMENT     NASAL SEPTUM SURGERY  ~ [place]  09/08/2011   Procedure: TOTAL HIP ARTHROPLASTY ANTERIOR APPROACH;  Surgeon: Mcarthur Rossetti, MD;  [facility];  Service: Orthopedics;  Laterality: Right;  Right total hip replacement, left knee steroid injection   TOTAL KNEE ARTHROPLASTY Left 11/29/2012   Procedure: LEFT TOTAL KNEE ARTHROPLASTY;  Surgeon: Mcarthur Rossetti, MD;  [facility];  Service: Orthopedics;  Laterality: Left;  FEMORAL NERVE BLOCK IN HOLDING AREA LEFT LEG   TOTAL KNEE ARTHROPLASTY Right 11/30/2017   Procedure: RIGHT TOTAL KNEE ARTHROPLASTY;  Surgeon: Mcarthur Rossetti, MD;  [facility];  Service: Orthopedics;  Laterality: Right;   URETHRAL FISTULA REPAIR  ~ [place]  "several times"  VIDEO BRONCHOSCOPY N/A 10/05/2020   Procedure: VIDEO BRONCHOSCOPY WITHOUT FLUORO;  Surgeon: Brand Males, MD;  [facility];  Service: Endoscopy;  Laterality: N/A;    MEDICATIONS:  atorvastatin (LIPITOR) 80 MG tablet   carvedilol (COREG) 25 MG tablet   dapagliflozin propanediol (FARXIGA) 10 MG TABS tablet   dofetilide (TIKOSYN) 250 MCG capsule   eplerenone (INSPRA) 50 MG tablet   furosemide (LASIX) 40 MG tablet   Multiple Vitamin (MULTIVITAMIN WITH MINERALS) TABS tablet   rivaroxaban (XARELTO) 20 MG TABS tablet   sacubitril-valsartan (ENTRESTO) 97-103 MG   No current facility-administered medications for this encounter.   Terry Felix Ward, PA-C [facility] [phone]

## 2022-03-20 DIAGNOSIS — R350 Frequency of micturition: Secondary | ICD-10-CM | POA: Diagnosis not present

## 2022-03-21 ENCOUNTER — Encounter: Payer: Self-pay | Admitting: Cardiology

## 2022-03-21 ENCOUNTER — Encounter (HOSPITAL_COMMUNITY): Payer: Self-pay | Admitting: Cardiology

## 2022-03-23 NOTE — Anesthesia Preprocedure Evaluation (Addendum)
Anesthesia Evaluation  Patient identified by MRN, date of birth, ID band Patient awake    Reviewed: Allergy & Precautions, NPO status , Patient's Chart, lab work & pertinent test results, reviewed documented beta blocker date and time   History of Anesthesia Complications (+) history of anesthetic complications (h/o irregular HB after anesthesia in the 90s)  Airway Mallampati: III  TM Distance: >3 FB Neck ROM: Full    Dental  (+) Dental Advisory Given   Pulmonary neg pulmonary ROS   Pulmonary exam normal breath sounds clear to auscultation       Cardiovascular hypertension, Pt. on medications and Pt. on home beta blockers (-) angina + CAD and +CHF (EF 35%)  + dysrhythmias Atrial Fibrillation + Cardiac Defibrillator (St. Jude) + Valvular Problems/Murmurs (mild) AI and MR  Rhythm:Regular Rate:Normal  HLD, ascending aorta dilation (43 mm)  NM PET CT Myocardial Perfusion Study 02/14/2022: Narrative & Impression     Findings are consistent with mild ischemia; there is very mild perfusion defect mid anterol lateral with normal LAD stress flow.   There study is high risk in the setting of severely reduced LVEF- patient went into A fib RVR during study that did not self convert until ED- this may lead to diminished LVEF   Rest left ventricular function is abnormal. Rest global function is severely reduced. Rest EF: 28 %. Stress left ventricular function is abnormal. Stress global function is severely reduced. There were multiple regional abnormalities. Stress EF: 17 %. End diastolic cavity size is normal. End systolic cavity size is mildly enlarged.   Coronary calcium was present on the attenuation correction CT images. Moderate coronary calcifications were present. Coronary calcifications were present in the left anterior descending artery, left circumflex artery and right coronary artery distribution(s).CIED leads noted.  Aortic  atherosclerosis noted.  TTE 01/31/2022: IMPRESSIONS     1. Left ventricular ejection fraction, by estimation, is 35%. The left  ventricle has moderately decreased function. The left ventricle  demonstrates global hypokinesis. Left ventricular diastolic parameters are  consistent with Grade I diastolic  dysfunction (impaired relaxation).   2. Right ventricular systolic function is mildly reduced. The right  ventricular size is normal. There is normal pulmonary artery systolic  pressure. The estimated right ventricular systolic pressure is 123456 mmHg.   3. Left atrial size was mildly dilated.   4. The mitral valve is normal in structure. Mild mitral valve  regurgitation. No evidence of mitral stenosis.   5. The aortic valve is tricuspid. Aortic valve regurgitation is mild. No  aortic stenosis is present.   6. Aortic dilatation noted. There is mild dilatation of the aortic root,  measuring 43 mm.   7. The inferior vena cava is normal in size with greater than 50%  respiratory variability, suggesting right atrial pressure of 3 mmHg.      Neuro/Psych negative neurological ROS     GI/Hepatic hiatal hernia,GERD  ,,(+) Hepatitis - (in the 1960s)  Endo/Other  negative endocrine ROS    Renal/GU negative Renal ROS     Musculoskeletal  (+) Arthritis , Rheumatoid disorders,    Abdominal   Peds  Hematology  (+) Blood dyscrasia, anemia   Anesthesia Other Findings Last Xarelto: 03/20/2022  Per note 03/17/2022: Electrophysiologist's Recommendations:    Have magnet available.  Provide continuous ECG monitoring when magnet is used or reprogramming is to be performed.   Procedure may interfere with device function.  Magnet should be placed over device during procedure.   Reproductive/Obstetrics  Anesthesia Physical Anesthesia Plan  ASA: 4  Anesthesia Plan: General and Regional   Post-op Pain Management: Regional block* and  Tylenol PO (pre-op)*   Induction: Intravenous  PONV Risk Score and Plan: 2 and Ondansetron, Dexamethasone and Treatment may vary due to age or medical condition  Airway Management Planned: Oral ETT  Additional Equipment: ClearSight  Intra-op Plan:   Post-operative Plan: Extubation in OR  Informed Consent: I have reviewed the patients History and Physical, chart, labs and discussed the procedure including the risks, benefits and alternatives for the proposed anesthesia with the patient or authorized representative who has indicated his/her understanding and acceptance.     Dental advisory given  Plan Discussed with: CRNA and Anesthesiologist  Anesthesia Plan Comments: (Discussed potential risks of nerve blocks including, but not limited to, infection, bleeding, nerve damage, seizures, pneumothorax, respiratory depression, and potential failure of the block. Alternatives to nerve blocks discussed. All questions answered.  Risks of general anesthesia discussed including, but not limited to, sore throat, hoarse voice, chipped/damaged teeth, injury to vocal cords, nausea and vomiting, allergic reactions, lung infection, heart attack, stroke, and death. All questions answered. )        Anesthesia Quick Evaluation

## 2022-03-24 ENCOUNTER — Encounter (HOSPITAL_COMMUNITY): Payer: Self-pay | Admitting: Orthopedic Surgery

## 2022-03-24 ENCOUNTER — Ambulatory Visit (HOSPITAL_COMMUNITY): Payer: Medicare Other | Admitting: Certified Registered"

## 2022-03-24 ENCOUNTER — Other Ambulatory Visit: Payer: Self-pay

## 2022-03-24 ENCOUNTER — Ambulatory Visit (HOSPITAL_COMMUNITY): Payer: Medicare Other

## 2022-03-24 ENCOUNTER — Encounter (HOSPITAL_COMMUNITY)
Admission: AD | Disposition: A | Discharge status: Home / Self Care | Payer: Self-pay | Source: Ambulatory Visit | Attending: Orthopedic Surgery

## 2022-03-24 ENCOUNTER — Observation Stay (HOSPITAL_COMMUNITY)
Admission: AD | Admit: 2022-03-24 | Discharge: 2022-03-25 | Disposition: A | Discharge status: Home / Self Care | Payer: Medicare Other | Source: Ambulatory Visit | Attending: Orthopedic Surgery | Admitting: Orthopedic Surgery

## 2022-03-24 ENCOUNTER — Ambulatory Visit (HOSPITAL_COMMUNITY): Payer: Medicare Other | Admitting: Physician Assistant

## 2022-03-24 DIAGNOSIS — Z7901 Long term (current) use of anticoagulants: Secondary | ICD-10-CM | POA: Diagnosis not present

## 2022-03-24 DIAGNOSIS — I480 Paroxysmal atrial fibrillation: Secondary | ICD-10-CM | POA: Insufficient documentation

## 2022-03-24 DIAGNOSIS — I5022 Chronic systolic (congestive) heart failure: Secondary | ICD-10-CM | POA: Diagnosis not present

## 2022-03-24 DIAGNOSIS — Z96641 Presence of right artificial hip joint: Secondary | ICD-10-CM | POA: Diagnosis not present

## 2022-03-24 DIAGNOSIS — M19011 Primary osteoarthritis, right shoulder: Secondary | ICD-10-CM | POA: Diagnosis not present

## 2022-03-24 DIAGNOSIS — Z96653 Presence of artificial knee joint, bilateral: Secondary | ICD-10-CM | POA: Diagnosis not present

## 2022-03-24 DIAGNOSIS — D649 Anemia, unspecified: Secondary | ICD-10-CM | POA: Diagnosis not present

## 2022-03-24 DIAGNOSIS — I2510 Atherosclerotic heart disease of native coronary artery without angina pectoris: Secondary | ICD-10-CM

## 2022-03-24 DIAGNOSIS — Z96611 Presence of right artificial shoulder joint: Secondary | ICD-10-CM | POA: Diagnosis present

## 2022-03-24 DIAGNOSIS — Z471 Aftercare following joint replacement surgery: Secondary | ICD-10-CM | POA: Diagnosis not present

## 2022-03-24 DIAGNOSIS — Z9889 Other specified postprocedural states: Secondary | ICD-10-CM | POA: Diagnosis not present

## 2022-03-24 DIAGNOSIS — I509 Heart failure, unspecified: Secondary | ICD-10-CM

## 2022-03-24 DIAGNOSIS — Z79899 Other long term (current) drug therapy: Secondary | ICD-10-CM | POA: Insufficient documentation

## 2022-03-24 DIAGNOSIS — I110 Hypertensive heart disease with heart failure: Secondary | ICD-10-CM

## 2022-03-24 DIAGNOSIS — M12811 Other specific arthropathies, not elsewhere classified, right shoulder: Secondary | ICD-10-CM | POA: Diagnosis not present

## 2022-03-24 DIAGNOSIS — G8918 Other acute postprocedural pain: Secondary | ICD-10-CM | POA: Diagnosis not present

## 2022-03-24 HISTORY — PX: REVERSE SHOULDER ARTHROPLASTY: SHX5054

## 2022-03-24 LAB — GLUCOSE, CAPILLARY: Glucose-Capillary: 109 mg/dL — ABNORMAL HIGH (ref 70–99)

## 2022-03-24 SURGERY — ARTHROPLASTY, SHOULDER, TOTAL, REVERSE
Anesthesia: Regional | Site: Shoulder | Laterality: Right

## 2022-03-24 MED ORDER — BUPIVACAINE LIPOSOME 1.3 % IJ SUSP
INTRAMUSCULAR | Status: DC | PRN
  Administered 2022-03-24: 10 mL via PERINEURAL

## 2022-03-24 MED ORDER — 0.9 % SODIUM CHLORIDE (POUR BTL) OPTIME
TOPICAL | Status: DC | PRN
  Administered 2022-03-24: 1000 mL

## 2022-03-24 MED ORDER — CHLORHEXIDINE GLUCONATE 0.12 % MT SOLN
15.0000 mL | Freq: Once | OROMUCOSAL | Status: AC
  Administered 2022-03-24: 15 mL via OROMUCOSAL

## 2022-03-24 MED ORDER — ATROPINE SULFATE 0.4 MG/ML IV SOLN
INTRAVENOUS | Status: DC | PRN
  Administered 2022-03-24: .2 mg via INTRAVENOUS

## 2022-03-24 MED ORDER — ONDANSETRON HCL 4 MG PO TABS: 4.0000 mg | ORAL_TABLET | Freq: Three times a day (TID) | ORAL | 0 refills | Status: DC | PRN

## 2022-03-24 MED ORDER — DOFETILIDE 250 MCG PO CAPS
250.0000 ug | ORAL_CAPSULE | Freq: Two times a day (BID) | ORAL | Status: DC
  Filled 2022-03-24: qty 1

## 2022-03-24 MED ORDER — EPHEDRINE SULFATE-NACL 50-0.9 MG/10ML-% IV SOSY
PREFILLED_SYRINGE | INTRAVENOUS | Status: DC | PRN
  Administered 2022-03-24: 5 mg via INTRAVENOUS

## 2022-03-24 MED ORDER — MIDAZOLAM HCL 2 MG/2ML IJ SOLN
INTRAMUSCULAR | Status: AC
  Filled 2022-03-24: qty 2

## 2022-03-24 MED ORDER — GLYCOPYRROLATE 0.2 MG/ML IJ SOLN
INTRAMUSCULAR | Status: DC | PRN
  Administered 2022-03-24 (×2): .2 mg via INTRAVENOUS

## 2022-03-24 MED ORDER — ISOPROPYL ALCOHOL 70 % SOLN
Status: DC | PRN
  Administered 2022-03-24: 1 via TOPICAL

## 2022-03-24 MED ORDER — ORAL CARE MOUTH RINSE: 15.0000 mL | OROMUCOSAL | Status: DC | PRN

## 2022-03-24 MED ORDER — VANCOMYCIN HCL IN DEXTROSE 1-5 GM/200ML-% IV SOLN
1000.0000 mg | Freq: Once | INTRAVENOUS | Status: AC
  Administered 2022-03-24: 1000 mg via INTRAVENOUS
  Filled 2022-03-24: qty 200

## 2022-03-24 MED ORDER — VANCOMYCIN HCL 1000 MG IV SOLR
INTRAVENOUS | Status: AC
  Filled 2022-03-24: qty 20

## 2022-03-24 MED ORDER — ONDANSETRON HCL 4 MG/2ML IJ SOLN
INTRAMUSCULAR | Status: DC | PRN
  Administered 2022-03-24: 4 mg via INTRAVENOUS

## 2022-03-24 MED ORDER — SODIUM CHLORIDE 0.9 % IV SOLN: INTRAVENOUS | Status: AC

## 2022-03-24 MED ORDER — SPIRONOLACTONE 25 MG PO TABS
25.0000 mg | ORAL_TABLET | Freq: Every day | ORAL | Status: DC
  Filled 2022-03-24: qty 1

## 2022-03-24 MED ORDER — ACETAMINOPHEN 325 MG PO TABS: 325.0000 mg | ORAL_TABLET | Freq: Four times a day (QID) | ORAL | Status: DC | PRN

## 2022-03-24 MED ORDER — MENTHOL 3 MG MT LOZG: 1.0000 | LOZENGE | OROMUCOSAL | Status: DC | PRN

## 2022-03-24 MED ORDER — OXYCODONE HCL 5 MG/5ML PO SOLN: 5.0000 mg | Freq: Once | ORAL | Status: DC | PRN

## 2022-03-24 MED ORDER — RIVAROXABAN 20 MG PO TABS
20.0000 mg | ORAL_TABLET | Freq: Every day | ORAL | Status: DC
  Filled 2022-03-24: qty 1

## 2022-03-24 MED ORDER — HYDROCODONE-ACETAMINOPHEN 7.5-325 MG PO TABS: 1.0000 | ORAL_TABLET | ORAL | Status: DC | PRN

## 2022-03-24 MED ORDER — PROPOFOL 10 MG/ML IV BOLUS
INTRAVENOUS | Status: DC | PRN
  Administered 2022-03-24: 100 mg via INTRAVENOUS

## 2022-03-24 MED ORDER — HYDROCODONE-ACETAMINOPHEN 5-325 MG PO TABS
1.0000 | ORAL_TABLET | ORAL | Status: DC | PRN
  Administered 2022-03-24: 1 via ORAL
  Filled 2022-03-24: qty 1

## 2022-03-24 MED ORDER — FENTANYL CITRATE (PF) 100 MCG/2ML IJ SOLN
INTRAMUSCULAR | Status: AC
  Filled 2022-03-24: qty 2

## 2022-03-24 MED ORDER — SODIUM CHLORIDE (PF) 0.9 % IJ SOLN
INTRAMUSCULAR | Status: AC
  Filled 2022-03-24: qty 10

## 2022-03-24 MED ORDER — ONDANSETRON HCL 4 MG/2ML IJ SOLN: 4.0000 mg | Freq: Four times a day (QID) | INTRAMUSCULAR | Status: DC | PRN

## 2022-03-24 MED ORDER — ROCURONIUM BROMIDE 10 MG/ML (PF) SYRINGE
PREFILLED_SYRINGE | INTRAVENOUS | Status: AC
  Filled 2022-03-24: qty 10

## 2022-03-24 MED ORDER — ONDANSETRON HCL 4 MG/2ML IJ SOLN
INTRAMUSCULAR | Status: AC
  Filled 2022-03-24: qty 2

## 2022-03-24 MED ORDER — VASOPRESSIN 20 UNIT/ML IV SOLN
INTRAVENOUS | Status: AC
  Filled 2022-03-24: qty 1

## 2022-03-24 MED ORDER — TRANEXAMIC ACID-NACL 1000-0.7 MG/100ML-% IV SOLN
1000.0000 mg | INTRAVENOUS | Status: AC
  Administered 2022-03-24: 1000 mg via INTRAVENOUS
  Filled 2022-03-24: qty 100

## 2022-03-24 MED ORDER — OXYCODONE HCL 5 MG PO TABS: 5.0000 mg | ORAL_TABLET | Freq: Once | ORAL | Status: DC | PRN

## 2022-03-24 MED ORDER — CEFAZOLIN SODIUM-DEXTROSE 2-4 GM/100ML-% IV SOLN
2.0000 g | INTRAVENOUS | Status: AC
  Administered 2022-03-24: 2 g via INTRAVENOUS
  Filled 2022-03-24: qty 100

## 2022-03-24 MED ORDER — TRANEXAMIC ACID-NACL 1000-0.7 MG/100ML-% IV SOLN
1000.0000 mg | Freq: Once | INTRAVENOUS | Status: AC
  Administered 2022-03-24: 1000 mg via INTRAVENOUS
  Filled 2022-03-24: qty 100

## 2022-03-24 MED ORDER — AMISULPRIDE (ANTIEMETIC) 5 MG/2ML IV SOLN: 10.0000 mg | Freq: Once | INTRAVENOUS | Status: DC | PRN

## 2022-03-24 MED ORDER — SUCCINYLCHOLINE CHLORIDE 200 MG/10ML IV SOSY
PREFILLED_SYRINGE | INTRAVENOUS | Status: DC | PRN
  Administered 2022-03-24: 120 mg via INTRAVENOUS

## 2022-03-24 MED ORDER — DEXAMETHASONE SODIUM PHOSPHATE 10 MG/ML IJ SOLN
INTRAMUSCULAR | Status: DC | PRN
  Administered 2022-03-24: 4 mg via INTRAVENOUS

## 2022-03-24 MED ORDER — FENTANYL CITRATE (PF) 100 MCG/2ML IJ SOLN
INTRAMUSCULAR | Status: DC | PRN
  Administered 2022-03-24 (×2): 50 ug via INTRAVENOUS

## 2022-03-24 MED ORDER — LIDOCAINE 2% (20 MG/ML) 5 ML SYRINGE
INTRAMUSCULAR | Status: DC | PRN
  Administered 2022-03-24: 100 mg via INTRAVENOUS

## 2022-03-24 MED ORDER — METOCLOPRAMIDE HCL 5 MG/ML IJ SOLN: 5.0000 mg | Freq: Three times a day (TID) | INTRAMUSCULAR | Status: DC | PRN

## 2022-03-24 MED ORDER — CARVEDILOL 25 MG PO TABS: 25.0000 mg | ORAL_TABLET | Freq: Two times a day (BID) | ORAL | Status: DC

## 2022-03-24 MED ORDER — PROPOFOL 10 MG/ML IV BOLUS
INTRAVENOUS | Status: AC
  Filled 2022-03-24: qty 20

## 2022-03-24 MED ORDER — MORPHINE SULFATE (PF) 2 MG/ML IV SOLN: 0.5000 mg | INTRAVENOUS | Status: DC | PRN

## 2022-03-24 MED ORDER — VASOPRESSIN 20 UNITS/100 ML INFUSION FOR SHOCK
0.0000 [IU]/min | INTRAVENOUS | Status: DC
  Administered 2022-03-24: .02 [IU]/min via INTRAVENOUS
  Filled 2022-03-24: qty 100

## 2022-03-24 MED ORDER — SUCCINYLCHOLINE CHLORIDE 200 MG/10ML IV SOSY
PREFILLED_SYRINGE | INTRAVENOUS | Status: AC
  Filled 2022-03-24: qty 10

## 2022-03-24 MED ORDER — ORAL CARE MOUTH RINSE: 15.0000 mL | Freq: Once | OROMUCOSAL | Status: AC

## 2022-03-24 MED ORDER — DAPAGLIFLOZIN PROPANEDIOL 10 MG PO TABS
10.0000 mg | ORAL_TABLET | Freq: Every day | ORAL | Status: DC
  Administered 2022-03-25: 10 mg via ORAL
  Filled 2022-03-24: qty 1

## 2022-03-24 MED ORDER — SACUBITRIL-VALSARTAN 97-103 MG PO TABS
1.0000 | ORAL_TABLET | Freq: Two times a day (BID) | ORAL | Status: DC
  Administered 2022-03-25: 1 via ORAL
  Filled 2022-03-24: qty 1

## 2022-03-24 MED ORDER — ATROPINE SULFATE 0.4 MG/ML IV SOLN
INTRAVENOUS | Status: AC
  Filled 2022-03-24: qty 1

## 2022-03-24 MED ORDER — VASOPRESSIN 20 UNIT/ML IV SOLN
INTRAVENOUS | Status: DC | PRN
  Administered 2022-03-24 (×4): 1 [IU] via INTRAVENOUS
  Administered 2022-03-24: 2 [IU] via INTRAVENOUS
  Administered 2022-03-24 (×3): 1 [IU] via INTRAVENOUS

## 2022-03-24 MED ORDER — ACETAMINOPHEN 500 MG PO TABS
500.0000 mg | ORAL_TABLET | Freq: Four times a day (QID) | ORAL | Status: AC
  Administered 2022-03-24 – 2022-03-25 (×4): 500 mg via ORAL
  Filled 2022-03-24 (×4): qty 1

## 2022-03-24 MED ORDER — SPIRONOLACTONE 25 MG PO TABS: 25.0000 mg | ORAL_TABLET | Freq: Every day | ORAL | Status: DC

## 2022-03-24 MED ORDER — CEFAZOLIN SODIUM-DEXTROSE 1-4 GM/50ML-% IV SOLN
1.0000 g | Freq: Four times a day (QID) | INTRAVENOUS | Status: AC
  Administered 2022-03-24 – 2022-03-25 (×3): 1 g via INTRAVENOUS
  Filled 2022-03-24 (×3): qty 50

## 2022-03-24 MED ORDER — BUPIVACAINE HCL (PF) 0.5 % IJ SOLN
INTRAMUSCULAR | Status: DC | PRN
  Administered 2022-03-24: 10 mL via PERINEURAL

## 2022-03-24 MED ORDER — PHENOL 1.4 % MT LIQD: 1.0000 | OROMUCOSAL | Status: DC | PRN

## 2022-03-24 MED ORDER — SODIUM CHLORIDE 0.9 % IV SOLN: INTRAVENOUS | Status: DC

## 2022-03-24 MED ORDER — SODIUM CHLORIDE (PF) 0.9 % IJ SOLN
INTRAMUSCULAR | Status: AC
  Filled 2022-03-24: qty 30

## 2022-03-24 MED ORDER — FUROSEMIDE 40 MG PO TABS: 40.0000 mg | ORAL_TABLET | Freq: Every day | ORAL | Status: DC

## 2022-03-24 MED ORDER — METOCLOPRAMIDE HCL 10 MG PO TABS: 5.0000 mg | ORAL_TABLET | Freq: Three times a day (TID) | ORAL | Status: DC | PRN

## 2022-03-24 MED ORDER — OXYCODONE HCL 5 MG PO TABS: 5.0000 mg | ORAL_TABLET | Freq: Four times a day (QID) | ORAL | 0 refills | Status: DC | PRN

## 2022-03-24 MED ORDER — GLYCOPYRROLATE 0.2 MG/ML IJ SOLN
INTRAMUSCULAR | Status: AC
  Filled 2022-03-24: qty 2

## 2022-03-24 MED ORDER — ACETAMINOPHEN 500 MG PO TABS
1000.0000 mg | ORAL_TABLET | Freq: Once | ORAL | Status: AC
  Administered 2022-03-24: 1000 mg via ORAL
  Filled 2022-03-24: qty 2

## 2022-03-24 MED ORDER — RIVAROXABAN 20 MG PO TABS: 20.0000 mg | ORAL_TABLET | Freq: Every day | ORAL | Status: DC

## 2022-03-24 MED ORDER — ATORVASTATIN CALCIUM 40 MG PO TABS
80.0000 mg | ORAL_TABLET | Freq: Every day | ORAL | Status: DC
  Administered 2022-03-24: 80 mg via ORAL
  Filled 2022-03-24: qty 2

## 2022-03-24 MED ORDER — ONDANSETRON HCL 4 MG PO TABS: 4.0000 mg | ORAL_TABLET | Freq: Four times a day (QID) | ORAL | Status: DC | PRN

## 2022-03-24 MED ORDER — FENTANYL CITRATE PF 50 MCG/ML IJ SOSY: 25.0000 ug | PREFILLED_SYRINGE | INTRAMUSCULAR | Status: DC | PRN

## 2022-03-24 MED ORDER — DEXAMETHASONE SODIUM PHOSPHATE 10 MG/ML IJ SOLN
INTRAMUSCULAR | Status: AC
  Filled 2022-03-24: qty 1

## 2022-03-24 MED ORDER — TRAZODONE HCL 50 MG PO TABS
50.0000 mg | ORAL_TABLET | Freq: Every evening | ORAL | Status: DC | PRN
  Administered 2022-03-24: 50 mg via ORAL
  Filled 2022-03-24: qty 1

## 2022-03-24 MED ORDER — ROCURONIUM BROMIDE 10 MG/ML (PF) SYRINGE
PREFILLED_SYRINGE | INTRAVENOUS | Status: DC | PRN
  Administered 2022-03-24: 20 mg via INTRAVENOUS
  Administered 2022-03-24: 50 mg via INTRAVENOUS

## 2022-03-24 MED ORDER — VANCOMYCIN HCL 1000 MG IV SOLR
INTRAVENOUS | Status: DC | PRN
  Administered 2022-03-24: 1000 mg via TOPICAL

## 2022-03-24 MED ORDER — SACUBITRIL-VALSARTAN 97-103 MG PO TABS
1.0000 | ORAL_TABLET | Freq: Two times a day (BID) | ORAL | Status: DC
  Filled 2022-03-24: qty 1

## 2022-03-24 MED ORDER — DOFETILIDE 250 MCG PO CAPS
250.0000 ug | ORAL_CAPSULE | Freq: Two times a day (BID) | ORAL | Status: DC
  Administered 2022-03-24 – 2022-03-25 (×2): 250 ug via ORAL
  Filled 2022-03-24 (×2): qty 1

## 2022-03-24 MED ORDER — LACTATED RINGERS IV SOLN: INTRAVENOUS | Status: DC

## 2022-03-24 MED ORDER — DOCUSATE SODIUM 100 MG PO CAPS
100.0000 mg | ORAL_CAPSULE | Freq: Two times a day (BID) | ORAL | Status: DC
  Administered 2022-03-24 – 2022-03-25 (×2): 100 mg via ORAL
  Filled 2022-03-24 (×2): qty 1

## 2022-03-24 MED ORDER — STERILE WATER FOR IRRIGATION IR SOLN
Status: DC | PRN
  Administered 2022-03-24: 2000 mL

## 2022-03-24 MED ORDER — PHENYLEPHRINE HCL-NACL 20-0.9 MG/250ML-% IV SOLN
INTRAVENOUS | Status: DC | PRN
  Administered 2022-03-24: 20 ug/min via INTRAVENOUS

## 2022-03-24 SURGICAL SUPPLY — 70 items
AID PSTN UNV HD RSTRNT DISP (MISCELLANEOUS) ×1
BAG COUNTER SPONGE SURGICOUNT (BAG) IMPLANT
BAG SPEC THK2 15X12 ZIP CLS (MISCELLANEOUS) ×1
BAG SPNG CNTER NS LX DISP (BAG) ×1
BAG ZIPLOCK 12X15 (MISCELLANEOUS) ×1 IMPLANT
BIT DRILL 2.7 W/STOP DISP (BIT) IMPLANT
BIT DRILL TWIST 2.7 (BIT) IMPLANT
BLADE SAG 18X100X1.27 (BLADE) ×1 IMPLANT
BRNG HUM +3 36 RVRS SHLDR (Shoulder) ×1 IMPLANT
BSPLAT GLND LRG AUG TPR ADPR (Joint) ×1 IMPLANT
CEMENT BONE DEPUY (Cement) IMPLANT
COMP REV AUG LG W/TAPER/GLENOI (Joint) ×1 IMPLANT
COMPONENT RV AUG LG W/TAPR/GLN (Joint) IMPLANT
COVER BACK TABLE 60X90IN (DRAPES) ×1 IMPLANT
COVER SURGICAL LIGHT HANDLE (MISCELLANEOUS) ×1 IMPLANT
DRAPE ORTHO SPLIT 77X108 STRL (DRAPES) ×2
DRAPE SHEET LG 3/4 BI-LAMINATE (DRAPES) ×1 IMPLANT
DRAPE SURG 17X11 SM STRL (DRAPES) ×1 IMPLANT
DRAPE SURG ORHT 6 SPLT 77X108 (DRAPES) ×2 IMPLANT
DRAPE TOP 10253 STERILE (DRAPES) ×1 IMPLANT
DRAPE U-SHAPE 47X51 STRL (DRAPES) ×1 IMPLANT
DRSG ADAPTIC 3X8 NADH LF (GAUZE/BANDAGES/DRESSINGS) IMPLANT
DRSG AQUACEL AG ADV 3.5X 6 (GAUZE/BANDAGES/DRESSINGS) IMPLANT
DRSG AQUACEL AG ADV 3.5X10 (GAUZE/BANDAGES/DRESSINGS) ×1 IMPLANT
DURAPREP 26ML APPLICATOR (WOUND CARE) IMPLANT
ELECT REM PT RETURN 15FT ADLT (MISCELLANEOUS) ×1 IMPLANT
FACESHIELD WRAPAROUND (MASK) ×1 IMPLANT
FACESHIELD WRAPAROUND OR TEAM (MASK) ×1 IMPLANT
GLENOID SPHERE 36MM CVD +3 (Orthopedic Implant) IMPLANT
GLOVE BIO SURGEON STRL SZ7.5 (GLOVE) ×4 IMPLANT
GLOVE BIOGEL PI IND STRL 8 (GLOVE) ×2 IMPLANT
GOWN STRL REUS W/ TWL XL LVL3 (GOWN DISPOSABLE) ×2 IMPLANT
GOWN STRL REUS W/TWL XL LVL3 (GOWN DISPOSABLE) ×2
KIT BASIN OR (CUSTOM PROCEDURE TRAY) ×1 IMPLANT
KIT TURNOVER KIT A (KITS) IMPLANT
MANIFOLD NEPTUNE II (INSTRUMENTS) ×1 IMPLANT
NDL TAPERED W/ NITINOL LOOP (MISCELLANEOUS) IMPLANT
NEEDLE TAPERED W/ NITINOL LOOP (MISCELLANEOUS) IMPLANT
NS IRRIG 1000ML POUR BTL (IV SOLUTION) ×1 IMPLANT
PACK SHOULDER (CUSTOM PROCEDURE TRAY) ×1 IMPLANT
PAD CAST 4YDX4 CTTN HI CHSV (CAST SUPPLIES) ×1 IMPLANT
PADDING CAST COTTON 4X4 STRL (CAST SUPPLIES) ×1
PIN THREADED REVERSE (PIN) IMPLANT
REAMER GUIDE BUSHING SURG DISP (MISCELLANEOUS) IMPLANT
REAMER GUIDE W/SCREW AUG (MISCELLANEOUS) IMPLANT
RESTRAINT HEAD UNIVERSAL NS (MISCELLANEOUS) IMPLANT
SCREW BONE STRL 6.5MMX30MM (Screw) IMPLANT
SCREW LOCKING 4.75MMX15MM (Screw) IMPLANT
SCREW LOCKING STRL 4.75X25X3.5 (Screw) IMPLANT
SLING ARM IMMOBILIZER LRG (SOFTGOODS) IMPLANT
SPONGE T-LAP 4X18 ~~LOC~~+RFID (SPONGE) IMPLANT
STAPLER VISISTAT 35W (STAPLE) IMPLANT
STEM HUM STD SHLD  9 (Stem) ×1 IMPLANT
STEM HUM STD SHLD 9 (Stem) IMPLANT
STRIP CLOSURE SKIN 1/2X4 (GAUZE/BANDAGES/DRESSINGS) ×1 IMPLANT
SUCTION FRAZIER HANDLE 10FR (MISCELLANEOUS) ×1
SUCTION TUBE FRAZIER 10FR DISP (MISCELLANEOUS) ×1 IMPLANT
SUT FIBERWIRE #2 38 T-5 BLUE (SUTURE)
SUT MNCRL AB 3-0 PS2 18 (SUTURE) ×1 IMPLANT
SUT VIC AB 0 CT1 36 (SUTURE) ×1 IMPLANT
SUT VIC AB 1 CT1 36 (SUTURE) ×1 IMPLANT
SUT VIC AB 2-0 CT1 27 (SUTURE) ×1
SUT VIC AB 2-0 CT1 TAPERPNT 27 (SUTURE) ×1 IMPLANT
SUTURE FIBERWR #2 38 T-5 BLUE (SUTURE) IMPLANT
SUTURE TAPE 1.3 40 TPR END (SUTURE) ×2 IMPLANT
SUTURETAPE 1.3 40 TPR END (SUTURE) ×2
TOWEL OR 17X26 10 PK STRL BLUE (TOWEL DISPOSABLE) ×1 IMPLANT
TRAY HUM REV SHOULDER 36 +3 (Shoulder) IMPLANT
TRAY HUMERAL NEUTRAL EXT 6 (Shoulder) IMPLANT
TUBE SUCTION HIGH CAP CLEAR NV (SUCTIONS) ×1 IMPLANT

## 2022-03-24 NOTE — Transfer of Care (Signed)
Immediate Anesthesia Transfer of Care Note  Patient: Terry [name]  Procedure(s) Performed: REVERSE SHOULDER ARTHROPLASTY (Right: Shoulder)  Patient Location: PACU  Anesthesia Type:General  Level of Consciousness: awake, alert , and patient cooperative  Airway & Oxygen Therapy: Patient Spontanous Breathing and Patient connected to face mask oxygen  Post-op Assessment: Report given to RN and Post -op Vital signs reviewed and stable  Post vital signs: Reviewed and stable  Last Vitals:  Vitals Value Taken Time  BP 93/61 03/24/22 0930  Temp    Pulse 34 03/24/22 0931  Resp 16 03/24/22 0931  SpO2 98 % 03/24/22 0931  Vitals shown include unvalidated device data.  Last Pain:  Vitals:   03/24/22 [id number]  TempSrc: Oral  PainSc:          Complications: No notable events documented.

## 2022-03-24 NOTE — Progress Notes (Signed)
[place] regarding AICD interrogation. Per representative, Windle Guard is the representative that will be coming to interrogate AICD.

## 2022-03-24 NOTE — Care Management CC44 (Signed)
Condition Code 44 Documentation Completed  Patient Details  Name: Terry [name] MRN: [id number] Date of Birth: 1955-03-19   Condition Code 44 given:  Yes Patient signature on Condition Code 44 notice:  Yes Documentation of 2 MD's agreement:  Yes Code 44 added to claim:  Yes    Dessa Phi, RN 03/24/2022, 2:05 PM

## 2022-03-24 NOTE — H&P (Signed)
ORTHOPAEDIC H&P  REQUESTING PHYSICIAN: Nicholes Stairs, MD  PCP:  Shirline Frees, MD  Chief Complaint: Right shoulder end-stage osteoarthritis  HPI: Terry [name] is a 67 y.o. male who complains of right shoulder pain and stiffness.  This been ongoing now for many months.  He has had pretty exhaustive conservative treatment over the last few years.  Unfortunately does have congestive heart failure and multiple other cardiac issues.  These are well-controlled and he has had very good optimization with his cardiologist here today for right shoulder arthroplasty.  Past Medical History:  Diagnosis Date   Agatston coronary artery calcium score greater than 400    Cor Ca score 3129 on 11/2021   AICD (automatic cardioverter/defibrillator) present 08/05/2014   St Jude Ellipse VR   Anemia    "S/P knee & hip OR"   Aortic atherosclerosis (HCC)    Aortic insufficiency    mild    Ascending aorta dilation (HCC)    60m by echo and CT 01/2020 and CT 299991111  Chronic systolic CHF (congestive heart failure), NYHA class 1 (HCC)    Complication of anesthesia    hx of irregular beat after anesthesia in ~ 1990's   GERD (gastroesophageal reflux disease)    H/O hematuria    H/O hiatal hernia    Hepatitis 1960's   "caught it from my brother"   History of blood transfusion 2013; 2014   S/P knee and hip OR   Hypertension    Nonischemic dilated cardiomyopathy ([place]    EF 40-45% by echo 2019.  S/P AICD for primary prevention.  Repeat EF 01/2020 35-40%.    PAF (paroxysmal atrial fibrillation) ([place]    "off and on since 2013" (08/05/2014)   PVC (premature ventricular contraction)    PVC load 8%   [place]   Past Surgical History:  Procedure Laterality Date   BRONCHIAL WASHINGS  10/05/2020   Procedure: BRONCHIAL WASHINGS;  Surgeon: RBrand Males MD;  [facility];  Service: Endoscopy;;   CARDIAC CATHETERIZATION  ~ 2004   normal   CARDIAC  CATHETERIZATION N/A 09/07/2014   Procedure: Left Heart Cath and Coronary Angiography;  Surgeon: DLarey Dresser MD;  [facility];  Service: Cardiovascular;  Laterality: N/A;   CARDIAC DEFIBRILLATOR PLACEMENT  08/05/2014   St. Jude   CARDIOVERSION N/A 08/21/2013   Procedure: CARDIOVERSION;  Surgeon: TSueanne Margarita MD;  [facility]  Service: Cardiovascular;  Laterality: N/A;   COLONOSCOPY  2010; 2015   "polyps; no polyps"   ELBOW SURGERY Left 2021   Dr. 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    JOINT REPLACEMENT     NASAL SEPTUM SURGERY  ~ [place] 09/08/2011   Procedure: TOTAL HIP ARTHROPLASTY ANTERIOR APPROACH;  Surgeon: CMcarthur Rossetti MD;  [facility];  Service: Orthopedics;  Laterality: Right;  Right total hip replacement, left knee steroid injection   TOTAL KNEE ARTHROPLASTY Left 11/29/2012   Procedure: LEFT TOTAL KNEE ARTHROPLASTY;  Surgeon: CMcarthur Rossetti MD;  [facility];  Service: Orthopedics;  Laterality: Left;  FEMORAL NERVE BLOCK IN HOLDING AREA LEFT LEG   TOTAL KNEE ARTHROPLASTY Right 11/30/2017  Procedure: RIGHT TOTAL KNEE ARTHROPLASTY;  Surgeon: Mcarthur Rossetti, MD;  [facility];  Service: Orthopedics;  Laterality: Right;   URETHRAL FISTULA REPAIR  ~ [place]  "several times"   VIDEO BRONCHOSCOPY N/A 10/05/2020   Procedure: VIDEO BRONCHOSCOPY WITHOUT FLUORO;  Surgeon: Brand Males, MD;  [facility];  Service: Endoscopy;  Laterality: N/A;   Social History   Socioeconomic History   Marital status: Married    Spouse name: Not on file   Number of children: Not on file   Years of education: Not on file   Highest education level:  Not on file  Occupational History   Not on file  Tobacco Use   Smoking status: Never   Smokeless tobacco: Never  Vaping Use   Vaping Use: Never used  Substance and Sexual Activity   Alcohol use: Yes    Alcohol/week: 1.0 standard drink of alcohol    Types: 1 Cans of beer per week    Comment: rare   Drug use: No   Sexual activity: Yes  Other Topics Concern   Not on file  Social History Narrative   Not on file   Social Determinants of Health   Financial Resource Strain: Not on file  Food Insecurity: Not on file  Transportation Needs: Not on file  Physical Activity: Not on file  Stress: Not on file  Social Connections: Not on file   Family History  Problem Relation Age of Onset   Heart disease Father    Heart attack Father 74   Heart failure Brother    Allergies  Allergen Reactions   Other Other (See Comments)   Spironolactone     Gynecomastia   Sulfa Antibiotics     Thrush and hives   Prior to Admission medications   Medication Sig Start Date End Date Taking? Authorizing Provider  atorvastatin (LIPITOR) 80 MG tablet Take 1 tablet (80 mg total) by mouth daily. Patient taking differently: Take 80 mg by mouth daily in the afternoon. 01/31/22  Yes Larey Dresser, MD  carvedilol (COREG) 25 MG tablet Take 1 tablet (25 mg total) by mouth 2 (two) times daily. 04/26/21  Yes Turner, Eber Hong, MD  dapagliflozin propanediol (FARXIGA) 10 MG TABS tablet Take 1 tablet (10 mg total) by mouth daily before breakfast. 04/26/21  Yes Turner, Eber Hong, MD  dofetilide (TIKOSYN) 250 MCG capsule TAKE 1 CAPSULE (250 MCG TOTAL) BY MOUTH 2 TIMES DAILY. PLEASE CALL FOR OFFICE VISIT [phone] 04/26/21  Yes Sueanne Margarita, MD  eplerenone (INSPRA) 50 MG tablet TAKE 1 TABLET BY MOUTH EVERY DAY 01/31/22  Yes Larey Dresser, MD  furosemide (LASIX) 40 MG tablet Take 1 tablet (40 mg total) by mouth daily. 04/26/21  Yes Turner, Eber Hong, MD  Multiple Vitamin (MULTIVITAMIN WITH MINERALS) TABS tablet Take  1 tablet by mouth in the morning. Centrum Multivitamin   Yes [provider]  rivaroxaban (XARELTO) 20 MG TABS tablet Take 1 tablet (20 mg total) by mouth daily with supper. Patient taking differently: Take 20 mg by mouth daily in the afternoon. 04/26/21  Yes Turner, Eber Hong, MD  sacubitril-valsartan (ENTRESTO) 97-103 MG Take 1 tablet by mouth 2 (two) times daily. 04/26/21  Yes Sueanne Margarita, MD   No results found.  Positive ROS: All other systems have been reviewed and were otherwise negative with the exception of those mentioned in the HPI and as above.  Physical Exam: General: Alert, no acute distress  Cardiovascular: No pedal edema Respiratory: No cyanosis, no use of accessory musculature GI: No organomegaly, abdomen is soft and non-tender Skin: No lesions in the area of chief complaint Neurologic: Sensation intact distally Psychiatric: Patient is competent for consent with normal mood and affect Lymphatic: No axillary or cervical lymphadenopathy  MUSCULOSKELETAL: Right upper extremity is warm and well-perfused with no open wounds or lesions.  Neurovascular intact.  Assessment: Right shoulder end-stage osteoarthritis  Plan: -Plan to proceed today with reverse arthroplasty right shoulder.  We again discussed the risk and benefits of the procedure including but not limited to bleeding, infection, damage to surrounding nerves and vessels, fracture, dislocation, stiffness, need for revision surgery as well as the risk of anesthesia.  -We have held Eliquis preoperatively and will resume that tomorrow.  He really desires to be performed as an outpatient.  We will assess his stability intraoperatively and in the PACU.  Potentially discharge home after surgery if he looks good.    Nicholes Stairs, MD Cell [phone]    03/24/2022 6:58 AM

## 2022-03-24 NOTE — Anesthesia Postprocedure Evaluation (Signed)
Anesthesia Post Note  Patient: Terry [name]  Procedure(s) Performed: REVERSE SHOULDER ARTHROPLASTY (Right: Shoulder)     Patient location during evaluation: PACU Anesthesia Type: Regional Level of consciousness: awake Pain management: pain level controlled Vital Signs Assessment: post-procedure vital signs reviewed and stable Respiratory status: spontaneous breathing, nonlabored ventilation and respiratory function stable Cardiovascular status: blood pressure returned to baseline and stable Postop Assessment: no apparent nausea or vomiting Anesthetic complications: no   No notable events documented.  Last Vitals:  Vitals:   03/24/22 1030 03/24/22 1112  BP: (!) 90/50 (!) 98/51  Pulse: (!) 35 70  Resp: 16 14  Temp:  37 C  SpO2: 92% 93%    Last Pain:  Vitals:   03/24/22 1123  TempSrc:   PainSc: 0-No pain                 Nilda Simmer

## 2022-03-24 NOTE — Consult Note (Signed)
Initial Consultation Note   Patient: Terry [name] [id number] DOB: 15-Sep-1955 PCP: Shirline Frees, MD DOA: 03/24/2022 DOS: the patient was seen and examined on 03/24/2022 Primary service: Nicholes Stairs, MD  Referring physician: Victorino December  Reason for consult: PVC's during surgery on 03/24/2022  Assessment/Plan: Assessment and Plan: S/p R reverse shoulder arthroplasty  - Pain control and DVT prophylaxis per the orthopedic service   PVC's during surgery  - Cardiac monitoring on the telemetry unit  - Likely from the effects of the surgery  - Current HR 72 and BP 90/50   3. Hypotension  - Hold home BP's for now (entresto, lasix and coreg)  - IV NS 70 cc/hr for 1L   DVT prophylaxis: Per orthopedic service    TRH will continue to follow the patient.  HPI: Terry [name] is a 67 y.o. male with past medical history of non-ischemic dilated cardiomyopathy s/p AICD placement. Pt presented today for R shoulder surgery. He underwent a RIGHT reverse shoulder arthroplasty with Dr. Stann Mainland. Dr. Stann Mainland subsequently reached out to the medicine service because the pt had PVCs during the operation and the anesthesiologist wanted to keep the pt in the hospital for cardiac monitoring. Pt was seen and examined in the PACU. Current HR is 72 and BP was 90/50. The PACU nurse advised the pt is going to go to a telemetry bed. Medicine will consult on this pt and follow along as a consulting service.  Review of Systems: As mentioned in the history of present illness. All other systems reviewed and are negative. Past Medical History:  Diagnosis Date   Agatston coronary artery calcium score greater than 400    Cor Ca score 3129 on 11/2021   AICD (automatic cardioverter/defibrillator) present 08/05/2014   St Jude Ellipse VR   Anemia    "S/P knee & hip OR"   Aortic atherosclerosis (HCC)    Aortic insufficiency    mild    Ascending aorta dilation (HCC)    39m by echo and CT 01/2020 and CT 2023    Chronic systolic CHF (congestive heart failure), NYHA class 1 (HCC)    Complication of anesthesia    hx of irregular beat after anesthesia in ~ 1990's   GERD (gastroesophageal reflux disease)    H/O hematuria    H/O hiatal hernia    Hepatitis 1960's   "caught it from my brother"   History of blood transfusion 2013; 2014   S/P knee and hip OR   Hypertension    Nonischemic dilated cardiomyopathy ([facility]    EF 40-45% by echo 2019.  S/P AICD for primary prevention.  Repeat EF 01/2020 35-40%.    PAF ([place]oxysmal atrial fibrillation) [place]    "off and on since 2013" (08/05/2014)   PVC (premature ventricular contraction)    PVC load 8%   Rheumatoid arthritis ([place] dx'd 1995   Past Surgical History:  Procedure Laterality Date   BRONCHIAL WASHINGS  10/05/2020   Procedure: BRONCHIAL WASHINGS;  Surgeon: RBrand Males MD;  [facility];  Service: Endoscopy;;   CARDIAC CATHETERIZATION  ~ 2004   normal   CARDIAC CATHETERIZATION N/A 09/07/2014   Procedure: Left Heart Cath and Coronary Angiography;  Surgeon: DLarey Dresser MD;  [facility];  Service: Cardiovascular;  Laterality: N/A;   CARDIAC DEFIBRILLATOR PLACEMENT  08/05/2014   St. Jude   CARDIOVERSION N/A 08/21/2013   Procedure: CARDIOVERSION;  Surgeon: TSueanne Margarita MD;  [facility]  Service: Cardiovascular;  Laterality: N/A;   COLONOSCOPY  2010; 2015   "polyps; no polyps"   ELBOW SURGERY Left 2021   Dr. 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    JOINT REPLACEMENT     NASAL SEPTUM SURGERY  ~ [place]  09/08/2011   Procedure: TOTAL HIP ARTHROPLASTY ANTERIOR APPROACH;  Surgeon: Mcarthur Rossetti, MD;  [facility];  Service: Orthopedics;  Laterality:  Right;  Right total hip replacement, left knee steroid injection   TOTAL KNEE ARTHROPLASTY Left 11/29/2012   Procedure: LEFT TOTAL KNEE ARTHROPLASTY;  Surgeon: Mcarthur Rossetti, MD;  [facility];  Service: Orthopedics;  Laterality: Left;  FEMORAL NERVE BLOCK IN HOLDING AREA LEFT LEG   TOTAL KNEE ARTHROPLASTY Right 11/30/2017   Procedure: RIGHT TOTAL KNEE ARTHROPLASTY;  Surgeon: Mcarthur Rossetti, MD;  [facility];  Service: Orthopedics;  Laterality: Right;   URETHRAL FISTULA REPAIR  ~ [place]  "several times"   VIDEO BRONCHOSCOPY N/A 10/05/2020   Procedure: VIDEO BRONCHOSCOPY WITHOUT FLUORO;  Surgeon: Brand Males, MD;  [facility];  Service: Endoscopy;  Laterality: N/A;   Social History:  reports that he has never smoked. He has never used smokeless tobacco. He reports current alcohol use of about 1.0 standard drink of alcohol per week. He reports that he does not use drugs.  Allergies  Allergen Reactions   Other Other (See Comments)   Spironolactone     Gynecomastia   Sulfa Antibiotics     Thrush and hives    Family History  Problem Relation Age of Onset   Heart disease Father    Heart attack Father 73   Heart failure Brother     Prior to Admission medications   Medication Sig Start Date End Date Taking? Authorizing Provider  atorvastatin (LIPITOR) 80 MG tablet Take 1 tablet (80 mg total) by mouth daily. Patient taking differently: Take 80 mg by mouth daily in the afternoon. 01/31/22  Yes Larey Dresser, MD  carvedilol (COREG) 25 MG tablet Take 1 tablet (25 mg total) by mouth 2 (two) times daily. 04/26/21  Yes Turner, Eber Hong, MD  dapagliflozin propanediol (FARXIGA) 10 MG TABS tablet Take 1 tablet (10 mg total) by mouth daily before breakfast. 04/26/21  Yes Turner, Eber Hong, MD  dofetilide (TIKOSYN) 250 MCG capsule TAKE 1 CAPSULE (250 MCG TOTAL) BY MOUTH 2 TIMES DAILY. PLEASE CALL FOR OFFICE VISIT [phone]  04/26/21  Yes Sueanne Margarita, MD  eplerenone (INSPRA) 50 MG tablet TAKE 1 TABLET BY MOUTH EVERY DAY 01/31/22  Yes Larey Dresser, MD  furosemide (LASIX) 40 MG tablet Take 1 tablet (40 mg total) by mouth daily. 04/26/21  Yes Turner, Eber Hong, MD  Multiple Vitamin (MULTIVITAMIN WITH MINERALS) TABS tablet Take 1 tablet by mouth in the morning. Centrum Multivitamin   Yes [provider]  rivaroxaban (XARELTO) 20 MG TABS tablet Take 1 tablet (20 mg total) by mouth daily with supper. Patient taking differently: Take 20 mg by mouth daily in the afternoon. 04/26/21  Yes Turner, Eber Hong, MD  sacubitril-valsartan (ENTRESTO) 97-103 MG Take 1 tablet by mouth 2 (two) times daily. 04/26/21  Yes Sueanne Margarita, MD    Physical  Exam: Vitals:   03/24/22 0955 03/24/22 1000 03/24/22 1015 03/24/22 1030  BP: (!) 93/59 (!) 91/49 (!) 97/55 (!) 90/50  Pulse: 76 (!) 38 (!) 38 (!) 35  Resp: 16 15 16 16  $ Temp:      TempSrc:      SpO2: 92% 92% 93% 92%  Weight:      Height:       Physical Exam Constitutional:      Appearance: Normal appearance.  HENT:     Head: Normocephalic and atraumatic.     Mouth/Throat:     Mouth: Mucous membranes are moist.  Cardiovascular:     Rate and Rhythm: Normal rate and regular rhythm.  Pulmonary:     Effort: Pulmonary effort is normal.  Abdominal:     General: Abdomen is flat.     Palpations: Abdomen is soft.  Musculoskeletal:     Cervical back: Neck supple.     Comments: R shoulder sling   Skin:    General: Skin is warm and dry.  Neurological:     Mental Status: He is alert. Mental status is at baseline.  Psychiatric:        Mood and Affect: Mood normal.     Data Reviewed:   There are no new results to review at this time.    Primary team communication: Medicine will follow along as a consulting service   Thank you very much for involving [place] in the care of your patient.  Author: Lucienne Minks , MD 03/24/2022 10:55 AM  For on call review  [email or web address].

## 2022-03-24 NOTE — Discharge Instructions (Signed)
Orthopedic surgery discharge instructions:  -Maintain postoperative bandage until follow-up appointment.  This is waterproof, and you may begin showering on postoperative day #3.  Do not submerge underwater.  Maintain that bandage until your follow-up appointment in 2 weeks.  -No lifting over 2 pounds with operateive arm.  You may use the arm immediately for activities of daily living such as bathing, washing your face and brushing your teeth, eating, and getting dressed.  Otherwise maintain your sling when you are out of the house and sleeping.  -Apply ice liberally to the shoulder throughout the day.  For mild to moderate pain use Tylenol and Advil as needed around-the-clock.  For breakthrough pain use oxycodone as necessary.  -You will return to see Dr. Julieann Drummonds in the office in 2 weeks for routine postoperative check with x-rays.  

## 2022-03-24 NOTE — Brief Op Note (Signed)
03/24/2022  8:58 AM  PATIENT:  Wanda Plump  67 y.o. male  PRE-OPERATIVE DIAGNOSIS:  Right shoulder osteoarthritis  POST-OPERATIVE DIAGNOSIS:  Right shoulder rotator cuff arthropathy  PROCEDURE:  Procedure(s) with comments: REVERSE SHOULDER ARTHROPLASTY (Right) - 120  SURGEON:  Surgeon(s) and Role:    * Stann Mainland, Elly Modena, MD - Primary  PHYSICIAN ASSISTANT: Jonelle Sidle, PA-C   ANESTHESIA:   regional and general  EBL:  50 mL   BLOOD ADMINISTERED:none  DRAINS: none   LOCAL MEDICATIONS USED:  NONE  SPECIMEN:  No Specimen  DISPOSITION OF SPECIMEN:  N/A  COUNTS:  YES  TOURNIQUET:  * No tourniquets in log *  DICTATION: .Note written in EPIC  PLAN OF CARE: Admit to inpatient   PATIENT DISPOSITION:  PACU - hemodynamically stable.   Delay start of Pharmacological VTE agent (>24hrs) due to surgical blood loss or risk of bleeding: not applicable

## 2022-03-24 NOTE — Progress Notes (Signed)
Report received from A. Hagen Tidd,RN. No change in assessment. Continue plan of care. Kohler Pellerito, Laurel Dimmer, RN

## 2022-03-24 NOTE — Anesthesia Procedure Notes (Signed)
Anesthesia Regional Block: Interscalene brachial plexus block   Pre-Anesthetic Checklist: , timeout performed,  Correct Patient, Correct Site, Correct Laterality,  Correct Procedure, Correct Position, site marked,  Risks and benefits discussed,  Surgical consent,  Pre-op evaluation,  At surgeon's request and post-op pain management  Laterality: Right  Prep: chloraprep       Needles:  Injection technique: Single-shot  Needle Type: Echogenic Stimulator Needle     Needle Length: 9cm  Needle Gauge: 21     Additional Needles:   Procedures:,,,, ultrasound used (permanent image in chart),,    Narrative:  Start time: 03/24/2022 6:55 AM End time: 03/24/2022 6:58 AM Injection made incrementally with aspirations every 5 mL.  Performed by: Personally  Anesthesiologist: Nilda Simmer, MD  Additional Notes: Discussed risks and benefits of nerve block including, but not limited to, prolonged and/or permanent nerve injury involving sensory and/or motor function. Monitors were applied and a time-out was performed. The nerve and associated structures were visualized under ultrasound guidance. After negative aspiration, local anesthetic was slowly injected around the nerve. There was no evidence of high pressure during the procedure. There were no paresthesias. VSS remained stable and the patient tolerated the procedure well.

## 2022-03-24 NOTE — Op Note (Signed)
03/24/2022  9:00 AM  PATIENT:  Terry [name]    PRE-OPERATIVE DIAGNOSIS:  Right shoulder rotator cuff arthropathy  POST-OPERATIVE DIAGNOSIS:  Same  PROCEDURE: Right REVERSE SHOULDER ARTHROPLASTY  SURGEON:  Nicholes Stairs, MD  ASSISTANT: Jonelle Sidle, PA-C  Assistant attestation:  PA Thereasa Solo was present for the entire procedure.  ANESTHESIA:   General with interscalene  ESTIMATED BLOOD LOSS: 50 cc  PREOPERATIVE INDICATIONS:  Terry [name] is a  67 y.o. male with a diagnosis of Right shoulder osteoarthritis who failed conservative measures and elected for surgical management.    The risks benefits and alternatives were discussed with the patient preoperatively including but not limited to the risks of infection, bleeding, nerve injury, cardiopulmonary complications, the need for revision surgery, dislocation, brachial plexus palsy, incomplete relief of pain, among others, and the patient was willing to proceed.  OPERATIVE IMPLANTS:  Zimmer size 9 stem inset with a +3 polyethylene liner Biomet comprehensive glenoid side with large superior augment oriented at the 12 o'clock position as well as 30 mm central screw and 4 peripheral locking screws 36 mm +3 mm lateralized glenosphere   OPERATIVE FINDINGS: Advanced degenerative changes of the glenohumeral joint with significant native varus as well as retroversion of his humerus.  He had advanced rotator cuff arthropathy with high riding humeral head and deficient superior and anterior rotator cuff.  Long head of biceps tendon was absent from the joint.  OPERATIVE PROCEDURE: The patient was brought to the operating room and placed in the supine position. General anesthesia was administered. IV antibiotics were given. A Foley was not placed. Time out was performed. The upper extremity was prepped and draped in usual sterile fashion. The patient was in a beachchair position. Deltopectoral approach was carried out.  The biceps tendon was  not identified in the groove. The subscapularis was released off of the bone.   I then performed circumferential releases of the humerus, and then dislocated the head, and then reamed with the reamer to the above named size.  I then applied the jig, and cut the humeral head in 30 of retroversion, and then turned my attention to the glenoid.  Deep retractors were placed, and I resected the labrum, and then placed a guidepin into the center position on the glenoid, with slight inferior inclination. I then reamed over the guidepin, and this created a small metaphyseal cancellus blush inferiorly, removing just the cartilage to the subchondral bone superiorly. The base plate was selected and impacted place, and then I secured it centrally with a nonlocking screw, and I had excellent purchase both inferiorly and superiorly. I placed a short locking screws on anterior and posterior aspects.  We did utilize the large Biomet augmented baseplate oriented at the 12 o'clock position.  The step reamer was utilized.  I then turned my attention to the glenosphere, and impacted this into place, placing slight inferior offset (set on A).   The glenoid sphere was completely seated, and had engagement of the [facility] taper. I then turned my attention back to the humerus.  I sequentially broached, and then trialed, and was found to restore soft tissue tension, and it had 2 finger tightness. Therefore the above named components were selected. The shoulder felt stable throughout functional motion.  I then impacted the real prosthesis into place, we then performed the inset reamer, as well as the real humeral tray, and reduced the shoulder. The shoulder had excellent motion, and was stable, and I irrigated the wounds copiously.  Subscapularis was not repaired  I then irrigated the shoulder copiously once more, repaired the deltopectoral interval with #2 FiberWire followed by subcutaneous Vicryl, then staples for the skin.  The patient was awakened and returned back in stable and satisfactory condition. There no complications and they tolerated the procedure well.  All counts were correct x2.  The patient does have a complex cardiac history and did have some a variant arrhythmias during surgery but these were self resolving.  His internal defibrillator was turned off during the case.  He was able to be awakened from anesthetic in stable condition with the defibrillator turned back on and he was transported to PACU stable condition as well.  Disposition:  Sling was applied to the operative extremity and he will be nonweightbearing other than activities of daily living.  He may begin using the arm immediately for the ADLs.  He will be admitted to a telemetry bed just for monitoring given his cardiac history.  We will resume Eliquis tomorrow morning.  Hopeful discharge over the weekend.  Follow-up with me in 2 weeks with x-rays of the right shoulder.

## 2022-03-24 NOTE — Anesthesia Procedure Notes (Signed)
Procedure Name: Intubation Date/Time: 03/24/2022 7:48 AM  Performed by: Eben Burow, CRNAPre-anesthesia Checklist: Patient identified, Emergency Drugs available, Suction available, Patient being monitored and Timeout performed Patient Re-evaluated:Patient Re-evaluated prior to induction Oxygen Delivery Method: Circle system utilized Preoxygenation: Pre-oxygenation with 100% oxygen Induction Type: IV induction Ventilation: Mask ventilation without difficulty Laryngoscope Size: Mac and 4 Grade View: Grade II Tube type: Oral Tube size: 7.5 mm Number of attempts: 1 Airway Equipment and Method: Stylet Placement Confirmation: ETT inserted through vocal cords under direct vision, positive ETCO2 and breath sounds checked- equal and bilateral Secured at: 23 cm Tube secured with: Tape Dental Injury: Teeth and Oropharynx as per pre-operative assessment

## 2022-03-24 NOTE — TOC Initial Note (Signed)
Transition of Care [facility]) - Initial/Assessment Note    Patient Details  Name: Terry [name] MRN: [id number] Date of Birth: 10-Dec-1955  [facility] CM/SW Contact:    Dessa Phi, RN Phone Number: 03/24/2022, 1:54 PM  Clinical Narrative:  From home w/spouse. Follow for d/c needs.                 Expected Discharge Plan: Home/Self Care Barriers to Discharge: Continued Medical Work up   Patient Goals and CMS Choice            Expected Discharge Plan and Services                                              Prior Living Arrangements/Services                       Activities of Daily Living Home Assistive Devices/Equipment: Eyeglasses, Blood pressure cuff ADL Screening (condition at time of admission) Patient's cognitive ability adequate to safely complete daily activities?: Yes Is the patient deaf or have difficulty hearing?: No Does the patient have difficulty seeing, even when wearing glasses/contacts?: No Does the patient have difficulty concentrating, remembering, or making decisions?: No Patient able to express need for assistance with ADLs?: Yes Does the patient have difficulty dressing or bathing?: No Independently performs ADLs?: Yes (appropriate for developmental age) Does the patient have difficulty walking or climbing stairs?: No Weakness of Legs: None Weakness of Arms/Hands: Right  Permission Sought/Granted                  Emotional Assessment              Admission diagnosis:  History of reverse total replacement of right shoulder joint [Z98.890] Patient Active Problem List   Diagnosis Date Noted   History of reverse total replacement of right shoulder joint 03/24/2022   Agatston coronary artery calcium score greater than 400 12/10/2021   Pulmonary infiltrates    Aortic atherosclerosis (HCC)    Ascending aortic aneurysm ([place])    ICD (implantable cardioverter-defibrillator) in place 11/08/2018   PAF  (paroxysmal atrial fibrillation) ([place]) 05/14/2018   PVC (premature ventricular contraction) 05/14/2018   Bradycardia with 31-40 beats per minute 12/01/2017   Status post total knee replacement, right 11/30/2017   Unilateral primary osteoarthritis, right knee 10/17/2017   S/P ICD (internal cardiac defibrillator) procedure 08/05/2014   Nonischemic dilated cardiomyopathy ([place])    Hypertension    Aortic insufficiency    Persistent atrial fibrillation ([place])    Chronic systolic heart failure ([place])    Aortic root dilatation ([place])    Urinary retention 12/01/2012   Degenerative arthritis of left knee 11/29/2012   Degenerative arthritis of hip 09/08/2011   PCP:  Shirline Frees, MD Pharmacy:   [id number] - [place], [place] - [address] [place] [address] Phone: [phone] Fax: [phone]     Social Determinants of Health (SDOH) Social History: SDOH Screenings   Food Insecurity: No Food Insecurity (03/24/2022)  Housing: Low Risk  (03/24/2022)  Transportation Needs: No Transportation Needs (03/24/2022)  Utilities: Not At Risk (03/24/2022)  Tobacco Use: Low Risk  (03/24/2022)   SDOH Interventions:     Readmission Risk Interventions     No data to display

## 2022-03-24 NOTE — Care Management Obs Status (Signed)
[facility] NOTIFICATION   Patient Details  Name: Terry [name] MRN: [id number] Date of Birth: [birth year]   Medicare Observation Status Notification Given:  Yes    MahabirJuliann Pulse, RN 03/24/2022, 2:04 PM

## 2022-03-25 DIAGNOSIS — M19011 Primary osteoarthritis, right shoulder: Secondary | ICD-10-CM | POA: Diagnosis not present

## 2022-03-25 DIAGNOSIS — I5022 Chronic systolic (congestive) heart failure: Secondary | ICD-10-CM | POA: Diagnosis not present

## 2022-03-25 DIAGNOSIS — Z96641 Presence of right artificial hip joint: Secondary | ICD-10-CM | POA: Diagnosis not present

## 2022-03-25 DIAGNOSIS — Z79899 Other long term (current) drug therapy: Secondary | ICD-10-CM | POA: Diagnosis not present

## 2022-03-25 DIAGNOSIS — Z96653 Presence of artificial knee joint, bilateral: Secondary | ICD-10-CM | POA: Diagnosis not present

## 2022-03-25 DIAGNOSIS — M12811 Other specific arthropathies, not elsewhere classified, right shoulder: Secondary | ICD-10-CM | POA: Diagnosis not present

## 2022-03-25 DIAGNOSIS — Z7901 Long term (current) use of anticoagulants: Secondary | ICD-10-CM | POA: Diagnosis not present

## 2022-03-25 DIAGNOSIS — I480 Paroxysmal atrial fibrillation: Secondary | ICD-10-CM | POA: Diagnosis not present

## 2022-03-25 DIAGNOSIS — I110 Hypertensive heart disease with heart failure: Secondary | ICD-10-CM | POA: Diagnosis not present

## 2022-03-25 LAB — BASIC METABOLIC PANEL WITH GFR
Anion gap: 12 (ref 5–15)
BUN: 20 mg/dL (ref 8–23)
CO2: 19 mmol/L — ABNORMAL LOW (ref 22–32)
Calcium: 8.4 mg/dL — ABNORMAL LOW (ref 8.9–10.3)
Chloride: 94 mmol/L — ABNORMAL LOW (ref 98–111)
Creatinine, Ser: 1.13 mg/dL (ref 0.61–1.24)
GFR, Estimated: >60 mL/min
Glucose, Bld: 144 mg/dL — ABNORMAL HIGH (ref 70–99)
Potassium: 4.2 mmol/L (ref 3.5–5.1)
Sodium: 125 mmol/L — ABNORMAL LOW (ref 135–145)

## 2022-03-25 LAB — CBC
HCT: 32.2 % — ABNORMAL LOW (ref 39.0–52.0)
Hemoglobin: 10 g/dL — ABNORMAL LOW (ref 13.0–17.0)
MCH: 27.9 pg (ref 26.0–34.0)
MCHC: 31.1 g/dL (ref 30.0–36.0)
MCV: 89.7 fL (ref 80.0–100.0)
Platelets: 226 10*3/uL (ref 150–400)
RBC: 3.59 MIL/uL — ABNORMAL LOW (ref 4.22–5.81)
RDW: 13.1 % (ref 11.5–15.5)
WBC: 15 10*3/uL — ABNORMAL HIGH (ref 4.0–10.5)
nRBC: 0 % (ref 0.0–0.2)

## 2022-03-25 NOTE — Discharge Summary (Signed)
In most cases prophylactic antibiotics for Dental procdeures after total joint surgery are not necessary.  Exceptions are as follows:  1. History of prior total joint infection  2. Severely immunocompromised (Organ Transplant, cancer chemotherapy, Rheumatoid biologic meds such as [place])  3. Poorly controlled diabetes (A1C &gt; 8.0, blood glucose over 200)  If you have one of these conditions, contact your surgeon for an antibiotic prescription, prior to your dental procedure. Orthopedic Discharge Summary        Physician Discharge Summary  Patient ID: Terry [name] MRN: [id number] DOB/AGE: 02-Jan-1956 67 y.o.  Admit date: 03/24/2022 Discharge date: 03/25/2022   Procedures:  Procedure(s) (LRB): REVERSE SHOULDER ARTHROPLASTY (Right)  Attending Physician:  Dr. Stann Mainland   Admission Diagnoses:   right shoulder cuff arthropathy  Discharge Diagnoses:  right shoulder cuff arthropathy   Past Medical History:  Diagnosis Date   Agatston coronary artery calcium score greater than 400    Cor Ca score 3129 on 11/2021   AICD (automatic cardioverter/defibrillator) present 08/05/2014   St Jude Ellipse VR   Anemia    "S/P knee & hip OR"   Aortic atherosclerosis (HCC)    Aortic insufficiency    mild    Ascending aorta dilation (HCC)    67m by echo and CT 01/2020 and CT 299991111  Chronic systolic CHF (congestive heart failure), NYHA class 1 (HCC)    Complication of anesthesia    hx of irregular beat after anesthesia in ~ 1990's   GERD (gastroesophageal reflux disease)    H/O hematuria    H/O hiatal hernia    Hepatitis 1960's   "caught it from my brother"   History of blood transfusion 2013; 2014   S/P knee and hip OR   Hypertension    Nonischemic dilated cardiomyopathy ([facility]    EF 40-45% by echo 2019.  S/P AICD for primary prevention.  Repeat EF 01/2020 35-40%.    PAF (paroxysmal atrial fibrillation) ([facility]    "off and on since 2013" (08/05/2014)   PVC (premature ventricular  contraction)    PVC load 8%   Rheumatoid arthritis ([place] dx[place] 1995    PCP: HShirline Frees MD   Discharged Condition: good  Hospital Course:  Patient underwent the above stated procedure on 03/24/2022. Patient tolerated the procedure well and brought to the recovery room in good condition and subsequently to the floor. Patient had an uncomplicated hospital course and was stable for discharge.   Disposition: Discharge disposition: 01-Home or Self Care      with follow up in 2 weeks    Follow-up Information     RNicholes Stairs MD Follow up in 2 week(s).   Specialty: Orthopedic Surgery Why: For wound re-check [address] [place] [id number]                Dental Antibiotics:  In most cases prophylactic antibiotics for Dental procdeures after total joint surgery are not necessary.  Exceptions are as follows:  1. History of prior total joint infection  2. Severely immunocompromised (Organ Transplant, cancer chemotherapy, Rheumatoid biologic meds such as [place]  3. Poorly controlled diabetes (A1C &gt; 8.0, blood glucose over 200)  If you have one of these conditions, contact your surgeon for an antibiotic prescription, prior to your dental procedure.  Discharge Instructions     Call MD / Call 911   Complete by: As directed    If you experience chest pain or shortness of breath,  CALL 911 and be transported to the hospital emergency room.  If you develope a fever above 101 F, pus (white drainage) or increased drainage or redness at the wound, or calf pain, call your surgeon's office.   Constipation Prevention   Complete by: As directed    Drink plenty of fluids.  Prune juice may be helpful.  You may use a stool softener, such as Colace (over the counter) 100 mg twice a day.  Use MiraLax (over the counter) for constipation as needed.   Diet - low sodium heart healthy   Complete by: As directed     Increase activity slowly as tolerated   Complete by: As directed    Post-operative opioid taper instructions:   Complete by: As directed    POST-OPERATIVE OPIOID TAPER INSTRUCTIONS: It is important to wean off of your opioid medication as soon as possible. If you do not need pain medication after your surgery it is ok to stop day one. Opioids include: Codeine, Hydrocodone(Norco, Vicodin), Oxycodone(Percocet, oxycontin) and hydromorphone amongst others.  Long term and even short term use of opiods can cause: Increased pain response Dependence Constipation Depression Respiratory depression And more.  Withdrawal symptoms can include Flu like symptoms Nausea, vomiting And more Techniques to manage these symptoms Hydrate well Eat regular healthy meals Stay active Use relaxation techniques(deep breathing, meditating, yoga) Do Not substitute Alcohol to help with tapering If you have been on opioids for less than two weeks and do not have pain than it is ok to stop all together.  Plan to wean off of opioids This plan should start within one week post op of your joint replacement. Maintain the same interval or time between taking each dose and first decrease the dose.  Cut the total daily intake of opioids by one tablet each day Next start to increase the time between doses. The last dose that should be eliminated is the evening dose.          Allergies as of 03/25/2022       Reactions   Other Other (See Comments)   Spironolactone    Gynecomastia   Sulfa Antibiotics    Thrush and hives        Medication List     TAKE these medications    atorvastatin 80 MG tablet Commonly known as: LIPITOR Take 1 tablet (80 mg total) by mouth daily. What changed: when to take this   carvedilol 25 MG tablet Commonly known as: COREG Take 1 tablet (25 mg total) by mouth 2 (two) times daily.   dapagliflozin propanediol 10 MG Tabs tablet Commonly known as: Farxiga Take 1 tablet (10  mg total) by mouth daily before breakfast.   dofetilide 250 MCG capsule Commonly known as: TIKOSYN TAKE 1 CAPSULE (250 MCG TOTAL) BY MOUTH 2 TIMES DAILY. PLEASE CALL FOR OFFICE VISIT [phone]   Entresto 97-103 MG Generic drug: sacubitril-valsartan Take 1 tablet by mouth 2 (two) times daily.   eplerenone 50 MG tablet Commonly known as: INSPRA TAKE 1 TABLET BY MOUTH EVERY DAY   furosemide 40 MG tablet Commonly known as: LASIX Take 1 tablet (40 mg total) by mouth daily.   multivitamin with minerals Tabs tablet Take 1 tablet by mouth in the morning. Centrum Multivitamin   ondansetron 4 MG tablet Commonly known as: Zofran Take 1 tablet (4 mg total) by mouth every 8 (eight) hours as needed for vomiting or nausea.   oxyCODONE 5 MG immediate release tablet Commonly known as: Roxicodone Take  1 tablet (5 mg total) by mouth every 6 (six) hours as needed for severe pain or breakthrough pain.   rivaroxaban 20 MG Tabs tablet Commonly known as: Xarelto Take 1 tablet (20 mg total) by mouth daily with supper. What changed: when to take this          Signed: Ventura Bruns 03/25/2022, 8:11 AM  [facility] is now [employer] [address], [place], [place], [place] [address] Phone: [place]

## 2022-03-25 NOTE — Hospital Course (Signed)
Terry [name] is a 67 y.o. male with past medical history of non-ischemic dilated cardiomyopathy s/p AICD placement to hospital for right reverse shoulder arthroplasty with Dr. Stann Mainland.  Patient did have some PVCs during surgery in anesthetist wanted to keep the patient in the hospital for cardiac monitoring.  Blood pressure slightly borderline low as well.  S/p R reverse shoulder arthroplasty  - Pain control and DVT prophylaxis per the orthopedic service    PVC's during surgery  Blood pressure better at this time.  On telemetry monitoring.  Likely from surgical intervention.   Hypotension  Received normal saline 70 mill per hour for 1 L.  Entresto Lasix and Coreg on hold at this time.  Latest blood pressure of 105/60.

## 2022-03-25 NOTE — Progress Notes (Signed)
   Subjective: 1 Day Post-Op Procedure(s) (LRB): REVERSE SHOULDER ARTHROPLASTY (Right)  Pt doing well this morning Denies any chest pain or cough Appreciate hospitalist consult Denies any new symptoms overnight Otherwise doing well Patient reports pain as mild.  Objective:   VITALS:   Vitals:   03/25/22 0000 03/25/22 0400  BP: 105/67 105/60  Pulse: (!) 46 61  Resp: 16 17  Temp: 98.3 F (36.8 C) 97.8 F (36.6 C)  SpO2: 93% 94%    Right shoulder incision healing well Nv intact distally No rashes or edema Gentle rom without guarding  LABS Recent Labs    03/25/22 0524  HGB 10.0*  HCT 32.2*  WBC 15.0*  PLT 226    Recent Labs    03/25/22 0524  NA 125*  K 4.2  BUN 20  CREATININE 1.13  GLUCOSE 144*     Assessment/Plan: 1 Day Post-Op Procedure(s) (LRB): REVERSE SHOULDER ARTHROPLASTY (Right) Will monitor him this morning Plan for d/c home this afternoon if medical team agrees Pain management as needed F/u in the office in 2 weeks    Brad Wendelin Reader PA-C, [facility] is now [employer] [facility] [place], [place], [place] [address] Phone: [phone] [email or web address] Facebook  [employer]

## 2022-03-25 NOTE — Progress Notes (Signed)
Patient to be discharged to home today. Patient and Patient's Wife given discharge teaching/instructions including all discharge Medications and schedules for these Medications. Understanding verbalized. Discharge AVS with the Patient at time of discharge.

## 2022-03-27 ENCOUNTER — Encounter (HOSPITAL_COMMUNITY): Payer: Self-pay | Admitting: Orthopedic Surgery

## 2022-04-03 ENCOUNTER — Ambulatory Visit: Payer: Medicare Other | Attending: Cardiology

## 2022-04-03 DIAGNOSIS — I5022 Chronic systolic (congestive) heart failure: Secondary | ICD-10-CM | POA: Insufficient documentation

## 2022-04-03 LAB — HEPATIC FUNCTION PANEL
ALT: 16 U/L (ref 0–44)
AST: 25 U/L (ref 15–41)
Albumin: 3.4 g/dL — ABNORMAL LOW (ref 3.5–5.0)
Alkaline Phosphatase: 78 U/L (ref 38–126)
Bilirubin, Direct: 0.2 mg/dL (ref 0.0–0.2)
Indirect Bilirubin: 0.6 mg/dL (ref 0.3–0.9)
Total Bilirubin: 0.8 mg/dL (ref 0.3–1.2)
Total Protein: 6.6 g/dL (ref 6.5–8.1)

## 2022-04-03 LAB — LIPID PANEL
Cholesterol: 94 mg/dL (ref 0–200)
HDL: 29 mg/dL — ABNORMAL LOW (ref >40)
LDL Cholesterol: 43 mg/dL (ref 0–99)
Total CHOL/HDL Ratio: 3.2 RATIO
Triglycerides: 109 mg/dL (ref <150)
VLDL: 22 mg/dL (ref 0–40)

## 2022-04-04 NOTE — Progress Notes (Signed)
Remote ICD transmission.   

## 2022-04-11 DIAGNOSIS — Z4789 Encounter for other orthopedic aftercare: Secondary | ICD-10-CM | POA: Diagnosis not present

## 2022-04-19 DIAGNOSIS — M25511 Pain in right shoulder: Secondary | ICD-10-CM | POA: Diagnosis not present

## 2022-04-19 DIAGNOSIS — M25611 Stiffness of right shoulder, not elsewhere classified: Secondary | ICD-10-CM | POA: Diagnosis not present

## 2022-04-24 ENCOUNTER — Encounter (HOSPITAL_COMMUNITY): Payer: Self-pay | Admitting: Cardiology

## 2022-04-24 ENCOUNTER — Encounter: Payer: Self-pay | Admitting: Cardiology

## 2022-04-25 DIAGNOSIS — M25511 Pain in right shoulder: Secondary | ICD-10-CM | POA: Diagnosis not present

## 2022-04-25 DIAGNOSIS — M25611 Stiffness of right shoulder, not elsewhere classified: Secondary | ICD-10-CM | POA: Diagnosis not present

## 2022-04-27 DIAGNOSIS — M25511 Pain in right shoulder: Secondary | ICD-10-CM | POA: Diagnosis not present

## 2022-04-27 DIAGNOSIS — M25611 Stiffness of right shoulder, not elsewhere classified: Secondary | ICD-10-CM | POA: Diagnosis not present

## 2022-05-01 NOTE — Progress Notes (Signed)
Patient ID: Terry [name], male   DOB: 1955-06-09, 67 y.o.   MRN: [id number] Primary Care: Shirline Frees, MD Primary Cardiologist: Fransico Him, MD HF Cardiologist: Dr Aundra Dubin  HPI: Terry [name] is a 67 y.o. 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 Back in 2006, an echo showed EF 15-20%.  LHC at that time showed normal coronaries.  He has been managed for cardiomyopathy since that time. In 12/14, EF had improved to 50%.  However, he has had a decline since then.  Last echo in 5/16 showed EF 25%.  Atrial fibrillation was first noted in 2013.  In 7/15, he had DCCV to NSR.  In 1/16, he was noted to be back in atrial fibrillation and appears to have been in atrial fibrillation since that time. He had St Jude ICD placed by Dr Lovena Le in 6/16.  Given fall in EF, Lexiscan Cardiolite was done. This showed EF 39% with partially reversible inferior and apical perfusion defect. He had LHC in 7/16 showing no significant CAD.  In 8/16, he was admitted for Tikosyn initiation and converted to NSR.  Echo in 8/18 showed EF 35-40%, mild to moderately decreased RV systolic function.  Echo in 8/19 showed EF 40-45%, mild-moderate MR, mild to moderately decreased RV systolic function.   Holter in 8/19 showed 8% PVCs.   He had right TKR in [id number] without complications.   Echo in 10/20 showed EF stable at 40-45%. Echo in 12/21 showed EF 35-40%, moderate RV enlargement with normal systolic function, 4.2 cm ascending aorta.   Echo in 12/22 showed EF 40% with mild LV dilation, mild RV dilation, normal RV systolic function, mild-moderate MR.   Patient had calcium score in 10/23, 3129 Agatston units (98th percentile).  He was sent up for cardiac PET, still pending.   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  He got rid of the [place] and was given a course of prednisone, coughing resolved.   Echo was  done today and reviewed, EF 35%, global hypokinesis, mildly decreased RV systolic function, mild MR, mild AI, normal IVC.   He presents today for followup of CHF.  He is no longer coughing and is off prednisone.  Symptomatically doing well, no chest pain or exertional dyspnea.  He exercises several times/week at the gym and swims laps as well. No lightheadedness.  No chest pain. Planning to go on trip to [place] and [place] later in May.  Has right shoulder pain from rotator cuff injury.  No orthopnea/PND.  No palpitations.   ECG (personally reviewed): NSR, PVC, LAFB with septal Qs, QTc 493  St Jude device interrogation: no VT, stable thoracic impedance.   HIV negative, ferritin normal, immunofixation with no monoclonal protein.  Labs (6/16): K 4.2, Creatinine 1.08, HCT 37.5 Labs (08/18/14): K 4.2, Creatinine 1.0 Labs (8/16): K 4.7, creatinine 1.18 Labs (9/16): Mg 2.1, creatinine 1.16, K 4.6 Labs (6/18): K 4.4, creatinine 1.25 Labs (9/18): K 4.4, creatinine 1.24 Labs (12/18): K 4.1, creatinine 1.2, hgb 11.6 Labs (10/19): K 4.3, creatinine 1.0 Labs (3/21): K 4.7, creatinine 1.52 Labs (9/21): K 4.6, creaetinine 1.57 Labs (12/21): K 4.4, creatinine 1.75 Labs (2/22): LDL 58 Labs (7/22): K 4.5, creatinine 1.55 Labs (12/22): LDL 60, creatinine 1.53 Labs (10/23): LDL 64, Lp(a) 26, LDL particle number 947, K 4.3, creatinine 1.37  PMH 1. Nonischemic dilated cardiomyopathy: Echo (2006) with EF 15-20%.  LHC in 10/06 showed normal coronaries.  By 2014, EF had improved to 50%.  Echo in 1/16 showed EF 35-40%.  Echo (5/16) showed EF 25% with mild MR.  St Jude ICD 6/16.  HIV negative, immunofixation with no monoclonal protein, and ferritin normal.  Lexiscan Cardiolite (7/16) with EF 39% and partially reversible inferior and apical perfusion defect (intermediate risk).   LHC (7/16) with EF 35-40%, no significant CAD.  - Echo (8/17): EF 45-50% - Echo (8/18): EF 35-40%, moderate diastoilc dysfunction, mild  AI, mild MR, mild to moderately decreased RV systolic function.   - Echo (8/19): EF 40-45%, mild-moderate MR, mild to moderately decreased RV systolic function.  - Echo (10/20): EF 40-45%, mild LVH, normal RV, mild MR.  - Echo (12/21): EF 35-40%, moderate RV enlargement with normal systolic function, 4.2 cm ascending aorta.  - Echo (12/22): EF 40% with mild LV dilation, mild RV dilation, normal RV systolic function, mild-moderate MR. - Echo (12/23): EF 35%, global hypokinesis, mildly decreased RV systolic function, mild MR, mild AI, normal IVC. 2. Atrial fibrillation: Paroxysmal.  First noted in 2013.  DCCV in 7/15.  Back in atrial fibrillation 1/16.  Converted back to NSR with Tikosyn in 8/16.  3. HTN 4. Rheumatoid Arthritis: Has been off all medications for several years.   5. H/o bilateral THR 6. Gynecomastia with spironolactone.  7. PVCs: Holter in 8/19 showed 8% PVCs.  8. Ascending aortic aneurysm: 4.2 cm ascending aorta on 12/21 echo.  - CT chest (7/22) with 4.1 cm ascending aorta - CT chest (10/23) with 4.2 can ascending aorta.  9. CAD: Calcium score in 10/23, 3129 Agatston units (98th percentile).  10. Hypersensitivity pneumonitis: Down and parakeet.  Resolved with prednisone and avoidance of triggers.   SH: Lives at home with wife and 2 children and a grandchild. Never smoker, drinks 1 or 2 beers a week with dinner. 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   FH: Father with MI at 74, brother with atrial fibrillation, mother with renal failure.   Review of systems complete and found to be negative unless listed in HPI.    Current Outpatient Medications  Medication Sig Dispense Refill   atorvastatin (LIPITOR) 80 MG tablet Take 1 tablet (80 mg total) by mouth daily. (Patient taking differently: Take 80 mg by mouth daily in the afternoon.) 90 tablet 3   carvedilol (COREG) 25 MG tablet Take 1 tablet (25 mg total) by mouth 2 (two) times daily. 180 tablet 3    dapagliflozin propanediol (FARXIGA) 10 MG TABS tablet Take 1 tablet (10 mg total) by mouth daily before breakfast. 90 tablet 3   dofetilide (TIKOSYN) 250 MCG capsule TAKE 1 CAPSULE (250 MCG TOTAL) BY MOUTH 2 TIMES DAILY. PLEASE CALL FOR OFFICE VISIT [phone] 60 capsule 11   eplerenone (INSPRA) 50 MG tablet TAKE 1 TABLET BY MOUTH EVERY DAY 90 tablet 1   furosemide (LASIX) 40 MG tablet Take 1 tablet (40 mg total) by mouth daily. 90 tablet 3   Multiple Vitamin (MULTIVITAMIN WITH MINERALS) TABS tablet Take 1 tablet by mouth in the morning. Centrum Multivitamin     ondansetron (ZOFRAN) 4 MG tablet Take 1 tablet (4 mg total) by mouth every 8 (eight) hours as needed for vomiting or nausea. 20 tablet 0   oxyCODONE (ROXICODONE) 5 MG immediate release tablet Take 1 tablet (5 mg total) by mouth every 6 (six) hours as needed for severe pain or breakthrough pain. 22 tablet 0  rivaroxaban (XARELTO) 20 MG TABS tablet Take 1 tablet (20 mg total) by mouth daily with supper. (Patient taking differently: Take 20 mg by mouth daily in the afternoon.) 90 tablet 3   sacubitril-valsartan (ENTRESTO) 97-103 MG Take 1 tablet by mouth 2 (two) times daily. 180 tablet 3   No current facility-administered medications for this visit.   Allergies  Allergen Reactions   Other Other (See Comments)   Spironolactone     Gynecomastia   Sulfa Antibiotics     Thrush and hives   There were no vitals filed for this visit.   Wt Readings from Last 3 Encounters:  03/24/22 97 kg (213 lb 13.5 oz)  03/13/22 98 kg (216 lb)  02/21/22 97.1 kg (214 lb)    PHYSICAL EXAM: General: NAD Neck: No JVD, no thyromegaly or thyroid nodule.  Lungs: Clear to auscultation bilaterally with normal respiratory effort. CV: Nondisplaced PMI.  Heart regular S1/S2, no S3/S4, no murmur.  No peripheral edema.  No carotid bruit.  Normal pedal pulses.  Abdomen: Soft, nontender, no hepatosplenomegaly, no distention.  Skin: Intact without lesions or  rashes.  Neurologic: Alert and oriented x 3.  Psych: Normal affect. Extremities: No clubbing or cyanosis.  HEENT: Normal.   ASSESSMENT & PLAN: 1. Chronic systolic HF: Nonischemic cardiomyopathy x years.  He has a Research officer, political party ICD and is not a candidate for CRT due to narrow QRS.  7/16 LHC showed no significant CAD.  Echo today showed EF 35% with mildly decreased RV systolic function.  This is fairly stable.  NYHA class I-II symptoms with good exercise tolerance, not volume overloaded by exam or [place].  - Continue dapagliflozin 10 mg daily.    - Continue Coreg 25 mg bid.  - Continue Entresto 97/103 bid.   - Gynecomastia resolved off spironolactone. Continue eplerenone 50 mg daily. BMET today.  2.  Atrial fibrillation: Paroxysmal.  Holding NSR on Tikosyn.  QTc ok on ECG today.  - BMET and Mg today.  - Continue Xarelto 20 mg daily. 3. PVCs: Holter in 8/19 showed 8% PVCs.  This is unlikely to affect his cardiomyopathy.  Still with occasional PVCs, denies palpitations.  4. CKD: Stage 3.   - Continue dapagliflozin, BMET today.  5. Dilated ascending aorta: 4.2 cm on CT chest in 10/23.  6. Hypersensitivity pneumonitis: Cough resolved after getting rid of down around his house and getting rid of parakeet.  7. CAD: Calcium score 3129, 98th percentile.  He is asymptomatic with no chest pain or exertional dyspnea.    - Increase atorvastatin to 80 mg daily with lipids/LFTs in 2 months (goal LDL < 55).  - Cardiac PET scan has been ordered to look for ischemia.   [place], [place]  05/01/2022

## 2022-05-02 ENCOUNTER — Encounter (HOSPITAL_COMMUNITY): Payer: Self-pay

## 2022-05-02 ENCOUNTER — Ambulatory Visit (HOSPITAL_COMMUNITY)
Admission: RE | Admit: 2022-05-02 | Discharge: 2022-05-02 | Disposition: A | Discharge status: Home / Self Care | Payer: Medicare Other | Source: Ambulatory Visit | Attending: Family Medicine | Admitting: Family Medicine

## 2022-05-02 VITALS — BP 110/70 | HR 58 | Wt 209.8 lb

## 2022-05-02 DIAGNOSIS — N62 Hypertrophy of breast: Secondary | ICD-10-CM | POA: Diagnosis not present

## 2022-05-02 DIAGNOSIS — Z95810 Presence of automatic (implantable) cardiac defibrillator: Secondary | ICD-10-CM | POA: Diagnosis not present

## 2022-05-02 DIAGNOSIS — Z7901 Long term (current) use of anticoagulants: Secondary | ICD-10-CM | POA: Diagnosis not present

## 2022-05-02 DIAGNOSIS — J679 Hypersensitivity pneumonitis due to unspecified organic dust: Secondary | ICD-10-CM | POA: Diagnosis not present

## 2022-05-02 DIAGNOSIS — I7121 Aneurysm of the ascending aorta, without rupture: Secondary | ICD-10-CM | POA: Diagnosis not present

## 2022-05-02 DIAGNOSIS — M25511 Pain in right shoulder: Secondary | ICD-10-CM | POA: Diagnosis not present

## 2022-05-02 DIAGNOSIS — Z96611 Presence of right artificial shoulder joint: Secondary | ICD-10-CM | POA: Diagnosis not present

## 2022-05-02 DIAGNOSIS — I130 Hypertensive heart and chronic kidney disease with heart failure and stage 1 through stage 4 chronic kidney disease, or unspecified chronic kidney disease: Secondary | ICD-10-CM | POA: Insufficient documentation

## 2022-05-02 DIAGNOSIS — R059 Cough, unspecified: Secondary | ICD-10-CM | POA: Diagnosis not present

## 2022-05-02 DIAGNOSIS — M069 Rheumatoid arthritis, unspecified: Secondary | ICD-10-CM | POA: Diagnosis not present

## 2022-05-02 DIAGNOSIS — M25611 Stiffness of right shoulder, not elsewhere classified: Secondary | ICD-10-CM | POA: Diagnosis not present

## 2022-05-02 DIAGNOSIS — I2510 Atherosclerotic heart disease of native coronary artery without angina pectoris: Secondary | ICD-10-CM | POA: Insufficient documentation

## 2022-05-02 DIAGNOSIS — N183 Chronic kidney disease, stage 3 unspecified: Secondary | ICD-10-CM | POA: Insufficient documentation

## 2022-05-02 DIAGNOSIS — I420 Dilated cardiomyopathy: Secondary | ICD-10-CM | POA: Insufficient documentation

## 2022-05-02 DIAGNOSIS — I4819 Other persistent atrial fibrillation: Secondary | ICD-10-CM | POA: Insufficient documentation

## 2022-05-02 DIAGNOSIS — Z79899 Other long term (current) drug therapy: Secondary | ICD-10-CM | POA: Diagnosis not present

## 2022-05-02 DIAGNOSIS — I493 Ventricular premature depolarization: Secondary | ICD-10-CM | POA: Diagnosis not present

## 2022-05-02 DIAGNOSIS — Z7984 Long term (current) use of oral hypoglycemic drugs: Secondary | ICD-10-CM | POA: Diagnosis not present

## 2022-05-02 DIAGNOSIS — I5022 Chronic systolic (congestive) heart failure: Secondary | ICD-10-CM

## 2022-05-02 DIAGNOSIS — I480 Paroxysmal atrial fibrillation: Secondary | ICD-10-CM | POA: Diagnosis not present

## 2022-05-02 LAB — BASIC METABOLIC PANEL
Anion gap: 8 (ref 5–15)
BUN: 23 mg/dL (ref 8–23)
CO2: 25 mmol/L (ref 22–32)
Calcium: 8.9 mg/dL (ref 8.9–10.3)
Chloride: 102 mmol/L (ref 98–111)
Creatinine, Ser: 1.36 mg/dL — ABNORMAL HIGH (ref 0.61–1.24)
GFR, Estimated: 57 mL/min — ABNORMAL LOW (ref >60)
Glucose, Bld: 133 mg/dL — ABNORMAL HIGH (ref 70–99)
Potassium: 4 mmol/L (ref 3.5–5.1)
Sodium: 135 mmol/L (ref 135–145)

## 2022-05-02 LAB — BRAIN NATRIURETIC PEPTIDE: B Natriuretic Peptide: 118.1 pg/mL — ABNORMAL HIGH (ref 0.0–100.0)

## 2022-05-02 NOTE — Patient Instructions (Signed)
Thank you for coming in today  Labs were done today, if any labs are abnormal the clinic will call you No news is good news  Medications: No changes   Follow up appointments:  Your physician recommends that you schedule a follow-up appointment in:  3 months with Dr. Kendall Flack will receive a reminder letter in the mail a few months in advance. If you don't receive a letter, please call our office to schedule the follow-up appointment.     Do the following things EVERYDAY: Weigh yourself in the morning before breakfast. Write it down and keep it in a log. Take your medicines as prescribed Eat low salt foods--Limit salt (sodium) to 2000 mg per day.  Stay as active as you can everyday Limit all fluids for the day to less than 2 liters   At the [facility], you and your health needs are our priority. As part of our continuing mission to provide you with exceptional heart care, we have created designated Provider Care Teams. These Care Teams include your primary Cardiologist (physician) and Advanced Practice Providers (APPs- Physician Assistants and Nurse Practitioners) who all work together to provide you with the care you need, when you need it.   You may see any of the following providers on your designated Care Team at your next follow up: Dr Glori Bickers Dr Loralie Champagne Dr. Roxana Hires, NP Lyda Jester, [place] [facility] [place], [place] Forestine Na, NP [name], PharmD   Please be sure to bring in all your medications bottles to every appointment.    Thank you for choosing [facility]  If you have any questions or concerns before your next appointment please send [place] a message through [place] or call our office at [phone].    TO LEAVE A MESSAGE FOR THE NURSE SELECT OPTION 2, PLEASE LEAVE A MESSAGE INCLUDING: YOUR NAME DATE OF BIRTH CALL BACK NUMBER REASON FOR CALL**this is  important as we prioritize the call backs  YOU WILL RECEIVE A CALL BACK THE SAME DAY AS LONG AS YOU CALL BEFORE 4:00 PM

## 2022-05-04 DIAGNOSIS — M25511 Pain in right shoulder: Secondary | ICD-10-CM | POA: Diagnosis not present

## 2022-05-04 DIAGNOSIS — M25611 Stiffness of right shoulder, not elsewhere classified: Secondary | ICD-10-CM | POA: Diagnosis not present

## 2022-05-05 ENCOUNTER — Encounter: Payer: Self-pay | Admitting: Cardiology

## 2022-05-05 ENCOUNTER — Other Ambulatory Visit: Payer: Self-pay | Admitting: Cardiology

## 2022-05-08 ENCOUNTER — Other Ambulatory Visit (HOSPITAL_COMMUNITY): Payer: Self-pay

## 2022-05-08 MED ORDER — ENTRESTO 97-103 MG PO TABS: 1.0000 | ORAL_TABLET | Freq: Two times a day (BID) | ORAL | 3 refills | Status: DC

## 2022-05-09 DIAGNOSIS — Z4789 Encounter for other orthopedic aftercare: Secondary | ICD-10-CM | POA: Diagnosis not present

## 2022-05-09 DIAGNOSIS — M25511 Pain in right shoulder: Secondary | ICD-10-CM | POA: Diagnosis not present

## 2022-05-09 DIAGNOSIS — M25611 Stiffness of right shoulder, not elsewhere classified: Secondary | ICD-10-CM | POA: Diagnosis not present

## 2022-05-11 ENCOUNTER — Encounter: Payer: Self-pay | Admitting: Cardiology

## 2022-05-11 DIAGNOSIS — M25511 Pain in right shoulder: Secondary | ICD-10-CM | POA: Diagnosis not present

## 2022-05-11 DIAGNOSIS — M25611 Stiffness of right shoulder, not elsewhere classified: Secondary | ICD-10-CM | POA: Diagnosis not present

## 2022-05-12 ENCOUNTER — Other Ambulatory Visit: Payer: Self-pay | Admitting: Cardiology

## 2022-05-12 NOTE — Telephone Encounter (Signed)
This is a CHF pt 

## 2022-05-13 ENCOUNTER — Other Ambulatory Visit: Payer: Self-pay | Admitting: Cardiology

## 2022-05-16 DIAGNOSIS — M25611 Stiffness of right shoulder, not elsewhere classified: Secondary | ICD-10-CM | POA: Diagnosis not present

## 2022-05-16 DIAGNOSIS — M25511 Pain in right shoulder: Secondary | ICD-10-CM | POA: Diagnosis not present

## 2022-05-17 ENCOUNTER — Encounter: Payer: Self-pay | Admitting: Cardiology

## 2022-05-17 ENCOUNTER — Ambulatory Visit: Payer: Medicare Other | Attending: Cardiology | Admitting: Cardiology

## 2022-05-17 VITALS — BP 100/58 | HR 68 | Temp 97.3°F | Ht 71.0 in | Wt 205.0 lb

## 2022-05-17 DIAGNOSIS — I493 Ventricular premature depolarization: Secondary | ICD-10-CM

## 2022-05-17 DIAGNOSIS — I480 Paroxysmal atrial fibrillation: Secondary | ICD-10-CM | POA: Diagnosis not present

## 2022-05-17 DIAGNOSIS — I77810 Thoracic aortic ectasia: Secondary | ICD-10-CM | POA: Diagnosis not present

## 2022-05-17 DIAGNOSIS — I420 Dilated cardiomyopathy: Secondary | ICD-10-CM

## 2022-05-17 DIAGNOSIS — I110 Hypertensive heart disease with heart failure: Secondary | ICD-10-CM

## 2022-05-17 DIAGNOSIS — I10 Essential (primary) hypertension: Secondary | ICD-10-CM

## 2022-05-17 DIAGNOSIS — E7800 Pure hypercholesterolemia, unspecified: Secondary | ICD-10-CM | POA: Diagnosis not present

## 2022-05-17 DIAGNOSIS — I5022 Chronic systolic (congestive) heart failure: Secondary | ICD-10-CM

## 2022-05-17 NOTE — Progress Notes (Signed)
Virtual Visit via Video Note   Because of Shaka D Adelson's co-morbid illnesses, he is at least at moderate risk for complications without adequate follow up.  This format is felt to be most appropriate for this patient at this time.  All issues noted in this document were discussed and addressed.  A limited physical exam was performed with this format.  Please refer to the patient's chart for his consent to telehealth for [facility].   Date:  05/17/2022   ID:  Terry [name], DOB 12/22/1955, MRN [id number] The patient was identified using 2 identifiers.  Patient Location: Home Provider Location: Home Office   PCP:  Shirline Frees, [place] Providers Cardiologist:  Fransico Him, MD Advanced Heart Failure:  Loralie Champagne, MD     Evaluation Performed:  Follow-Up Visit  Chief Complaint:  HF, HTN, PAF, DCM   History of Present Illness:    Terry [name] is a 67 y.o. 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 Back in 2006, an echo showed EF 15-20%.  LHC at that time showed normal coronaries.  He has been managed for cardiomyopathy since that time.   In 12/14, EF had improved to 50%.  However, he then had a decline with echo in 5/16 showing EF 25%.  Atrial fibrillation was first noted in 2013.  In 7/15, he had DCCV to NSR.  In 1/16, he was noted to be back in atrial fibrillation. He had St Jude ICD placed by Dr Lovena Le in 6/16.  Given fall in EF, Lexiscan Cardiolite was done. This showed EF 39% with partially reversible inferior and apical perfusion defect. He had LHC in 7/16 showing no significant CAD.  In 8/16, he was admitted for Tikosyn initiation and converted to NSR.  Echo in 8/18 showed EF 35-40%, mild to moderately decreased RV systolic function.     Echo in 8/19 showed EF 40-45%, mild-moderate MR, mild to moderately decreased RV systolic function, mild to moderate MR and  dilated aortic root at 29mm and ascending aorta 18mm.  Holter showed 8% PVCs by tele monitor.  He is followed by EP for his ICD. Workup for DCM included HIV (neg), ferritin (normal) and no monoclonal Ab on SPEP/UPEP.   He is followed as well by AHF service.    He is here today for followup and is doing well.  Since I saw I'm last he had a shoulder replacement and is now 8 weeks out from his surgery.  Recovery was hard in the beginning but now doing much better.  He is still doing rehab for his shoulder. He is still walking and working out at [employer] and is working out in the yard.  He denies any chest pain or pressure, SOB, DOE, PND, orthopnea, LE edema, dizziness (except if he stands up too fast), palpitations or syncope. He is compliant with hus meds and is tolerating meds with no SE.     Past Medical History:  Diagnosis Date   Agatston coronary artery calcium score greater than 400    Cor Ca score 3129 on 11/2021   AICD (automatic cardioverter/defibrillator) present 08/05/2014   St Jude Ellipse VR   Anemia    "S/P knee & hip OR"   Aortic atherosclerosis    Aortic insufficiency    mild    Ascending aorta dilation    24mm by echo and CT  01/2020 and CT 99991111   Chronic systolic CHF (congestive heart failure), NYHA class 1    Complication of anesthesia    hx of irregular beat after anesthesia in ~ 1990's   GERD (gastroesophageal reflux disease)    H/O hematuria    H/O hiatal hernia    Hepatitis 1960's   "caught it from my brother"   History of blood transfusion 2013; 2014   S/P knee and hip OR   Hypertension    Nonischemic dilated cardiomyopathy    EF 40-45% by echo 2019.  S/P AICD for primary prevention.  Repeat EF 01/2020 35-40%.    PAF (paroxysmal atrial fibrillation)    "off and on since 2013" (08/05/2014)   PVC (premature ventricular contraction)    PVC load 8%   Rheumatoid arthritis dx'd 1995   Past Surgical History:  Procedure Laterality Date   BRONCHIAL WASHINGS   10/05/2020   Procedure: BRONCHIAL WASHINGS;  Surgeon: Brand Males, MD;  Location: [facility];  Service: Endoscopy;;   CARDIAC CATHETERIZATION  ~ 2004   normal   CARDIAC CATHETERIZATION N/A 09/07/2014   Procedure: Left Heart Cath and Coronary Angiography;  Surgeon: Larey Dresser, MD;  Location: [place] [facility];  Service: Cardiovascular;  Laterality: N/A;   CARDIAC DEFIBRILLATOR PLACEMENT  08/05/2014   St. Jude   CARDIOVERSION N/A 08/21/2013   Procedure: CARDIOVERSION;  Surgeon: Sueanne Margarita, MD;  Location: [facility];  Service: Cardiovascular;  Laterality: N/A;   COLONOSCOPY  2010; 2015   "polyps; no polyps"   ELBOW SURGERY Left 2021   Dr. 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    JOINT REPLACEMENT     NASAL SEPTUM SURGERY  ~ [place] Right 03/24/2022   Procedure: REVERSE SHOULDER ARTHROPLASTY;  Surgeon: Nicholes Stairs, MD;  Location: [facility];  Service: Orthopedics;  Laterality: Right;  [place] ARTHROPLASTY  09/08/2011   Procedure: TOTAL HIP ARTHROPLASTY ANTERIOR APPROACH;  Surgeon: Mcarthur Rossetti, MD;  Location: [facility];  Service: Orthopedics;  Laterality: Right;  Right total hip replacement, left knee steroid injection   TOTAL KNEE ARTHROPLASTY Left 11/29/2012   Procedure: LEFT TOTAL KNEE ARTHROPLASTY;  Surgeon: Mcarthur Rossetti, MD;  Location: [facility];  Service: Orthopedics;  Laterality: Left;  FEMORAL NERVE BLOCK IN HOLDING AREA LEFT LEG   TOTAL KNEE ARTHROPLASTY Right 11/30/2017   Procedure: RIGHT TOTAL KNEE ARTHROPLASTY;  Surgeon: Mcarthur Rossetti, MD;  Location: [facility];  Service: Orthopedics;  Laterality: Right;   URETHRAL FISTULA REPAIR  ~ [place]  "several times"    VIDEO BRONCHOSCOPY N/A 10/05/2020   Procedure: VIDEO BRONCHOSCOPY WITHOUT FLUORO;  Surgeon: Brand Males, MD;  Location: [facility];  Service: Endoscopy;  Laterality: N/A;     Current Meds  Medication Sig   atorvastatin (LIPITOR) 80 MG tablet Take 1 tablet (80 mg total) by mouth daily.   carvedilol (COREG) 25 MG tablet Take 1 tablet (25 mg total) by mouth 2 (two) times daily.   dapagliflozin propanediol (FARXIGA) 10 MG TABS tablet TAKE 1 TABLET BY MOUTH DAILY BEFORE BREAKFAST.   dofetilide (TIKOSYN) 250 MCG capsule TAKE 1 CAPSULE (250 MCG TOTAL) BY MOUTH 2 TIMES DAILY. PLEASE  CALL FOR OFFICE VISIT [phone]   eplerenone (INSPRA) 50 MG tablet TAKE 1 TABLET BY MOUTH EVERY DAY   furosemide (LASIX) 40 MG tablet TAKE 1 TABLET BY MOUTH EVERY DAY   Multiple Vitamin (MULTIVITAMIN WITH MINERALS) TABS tablet Take 1 tablet by mouth in the morning. Centrum Multivitamin   rivaroxaban (XARELTO) 20 MG TABS tablet Take 1 tablet (20 mg total) by mouth daily with supper.   sacubitril-valsartan (ENTRESTO) 97-103 MG Take 1 tablet by mouth 2 (two) times daily.     Allergies:   Other, Spironolactone, and Sulfa antibiotics   Social History   Tobacco Use   Smoking status: Never   Smokeless tobacco: Never  Vaping Use   Vaping Use: Never used  Substance Use Topics   Alcohol use: Yes    Alcohol/week: 1.0 standard drink of alcohol    Types: 1 Cans of beer per week    Comment: rare   Drug use: No     Family Hx: The patient's family history includes Heart attack (age of onset: 63) in his father; Heart disease in his father; Heart failure in his brother.  ROS:   Please see the history of present illness.     All other systems reviewed and are negative.   Prior CV studies:   The following studies were reviewed today:  none  Labs/Other Tests and Data Reviewed:    EKG:  No ECG reviewed.  Recent Labs: 02/14/2022: Magnesium 2.2 03/25/2022: Hemoglobin 10.0; Platelets 226 04/03/2022: ALT  16 05/02/2022: B Natriuretic Peptide 118.1; BUN 23; Creatinine, Ser 1.36; Potassium 4.0; Sodium 135   Recent Lipid Panel Lab Results  Component Value Date/Time   CHOL 94 04/03/2022 09:04 AM   CHOL 123 12/13/2021 08:19 AM   TRIG 109 04/03/2022 09:04 AM   HDL 29 (L) 04/03/2022 09:04 AM   HDL 42 12/13/2021 08:19 AM   CHOLHDL 3.2 04/03/2022 09:04 AM   LDLCALC 43 04/03/2022 09:04 AM   LDLCALC 64 12/13/2021 08:19 AM    Wt Readings from Last 3 Encounters:  05/17/22 205 lb (93 kg)  05/02/22 209 lb 12.8 oz (95.2 kg)  03/24/22 213 lb 13.5 oz (97 kg)     Risk Assessment/Calculations:    CHA2DS2-VASc Score = 4   This indicates a 4.8% annual risk of stroke. The patient's score is based upon: CHF History: 1 HTN History: 1 Diabetes History: 0 Stroke History: 0 Vascular Disease History: 1 Age Score: 1 Gender Score: 0         Objective:    Vital Signs:  BP (!) 100/58   Pulse 68   Temp (!) 97.3 F (36.3 C) (Tympanic)   Ht 5\' 11"  (1.803 m)   Wt 205 lb (93 kg)   SpO2 97%   BMI 28.59 kg/m    VITAL SIGNS:  reviewed GEN:  no acute distress EYES:  sclerae anicteric, EOMI - Extraocular Movements Intact RESPIRATORY:  normal respiratory effort, symmetric expansion CARDIOVASCULAR:  no peripheral edema SKIN:  no rash, lesions or ulcers. MUSCULOSKELETAL:  no obvious deformities. NEURO:  alert and oriented x 3, no obvious focal deficit PSYCH:  normal affect  ASSESSMENT & PLAN:    1.  Chronic systolic CHF -  -He has a St Jude ICD and is not a candidate for CRT due to narrow QRS.  7/16 LHC showed no significant CAD.  Echo 12/23 EF 35% with mildly decreased RV systolic function.  He has stable NHYA Class I symptoms.   -He continues to be  active walking several miles daily and working out in his yard  -His weight is stable and he does not have any shortness of breath.  He has chronic lower extremity edema due to chronic venous insufficiency Etiology of HF: Nonischemic NYHA class / AHA  Stage: NYHA 1 Volume status & Diuretics: Appears euvolemic on Lasix 40 mg daily Vasodilators: Continue Entresto 97-103 mg twice daily Beta-Blocker: Continue carvedilol 25 mg twice daily MRA: Continue eplerenone 50 mg daily (gynecomastia on spironolactone) Cardiometabolic: A999333 with Farxiga 10 mg daily Devices therapies & Valvulopathies: St Jude ICD (not a candidate for CRT due to narrow QRS complex) -I have personally reviewed and interpreted outside labs performed by patient's PCP which showed serum creatinine 1.36 and potassium 4 on 05/02/2022   2.  HTN  -BP controlled at home -Prescription drug management with carvedilol 25 mg twice daily, eplerenone 50 mg daily and Entresto 97-103 mg twice daily with as needed refills    3.  Paroxysmal atrial fibrillation  -he has not had any palpitations since I saw him last>>he did have a bout of PAF during the PET CT which resolved -He denies any bleeding problems on Xarelto -Continue prescription drug management Tikosyn 250 mcg twice daily, carvedilol 25 mg twice daily and Xarelto 20 mg daily with as needed refills -He is on Xarelto 20mg  daily for a CHADS2VASC score of 3 (HTN, CHF and atherosclerosis of the aorta) -I have personally reviewed and interpreted outside labs performed by patient's PCP which showed hemoglobin 10 on 03/25/2022  4.  PVCs  - Holter monitor showed 8% PVC load and therefore likely not the etiology of his DCM. -he denies any palpitations -Continue carvedilol 25 mg twice daily   5.  Nonischemic DCM  -Workup for secondary causes were negative (HIV/monoclonal Ab/normal ferritin).  -Cardiac cath 08/2014 with no CAD. -S/P AICD for primary prevention followed in device clinic by Dr. Lovena Le - no events on recent ICD interrogation -Not a candidate for CRT due to narrow QRS.   6.  Dilated aortic root and ascending aorta -mildly dilated on echo 01/2020 at 35mm and 80mm by echo 01/2021 -repeat echo 01/2022 showed 43 mm aortic root  and 41 mm ascending aorta which is slightly increased from prior evaluation -I will get a gated chest CTA to assess further   7.  HLD -LDL goal < 70  -I have personally reviewed and interpreted outside labs performed by patient's PCP which showed LDL 43, triglycerides 109 on 04/03/2022 -Continue prescription drug management with atorvastatin 20 mg daily with as needed refills   8.  RA -he has pulmonary nodules followed by Pulmonary -PASP was normal on echo 01/2022  9.  Coronary artery calcifications -coronary Ca score 3129 -Cardiac PET 1/24 showed mild ischemia in mid anterolateral wall with normal LAD stress flow, low risk from ischemia standpoint.  -he has no CP or SOB -no CAD in 2016 -continue prescription drug management with Atorvastatin 80mg  daily with PRN refills   Time:   Today, I have spent 20 minutes with the patient with telehealth technology discussing the above problems.     Medication Adjustments/Labs and Tests Ordered: Current medicines are reviewed at length with the patient today.  Concerns regarding medicines are outlined above.   Tests Ordered: No orders of the defined types were placed in this encounter.   Medication Changes: No orders of the defined types were placed in this encounter.   Follow Up:  In Person 6 months  Signed, Fransico Him, MD  05/17/2022 8:53 AM    [place]

## 2022-05-17 NOTE — Patient Instructions (Signed)
Medication Instructions:  Your physician recommends that you continue on your current medications as directed. Please refer to the Current Medication list given to you today.  *If you need a refill on your cardiac medications before your next appointment, please call your pharmacy*   Lab Work: None.  If you have labs (blood work) drawn today and your tests are completely normal, you will receive your results only by: [place] (if you have MyChart) OR A paper copy in the mail If you have any lab test that is abnormal or we need to change your treatment, we will call you to review the results.   Testing/Procedures: None.   Follow-Up: At [facility], you and your health needs are our priority.  As part of our continuing mission to provide you with exceptional heart care, we have created designated Provider Care Teams.  These Care Teams include your primary Cardiologist (physician) and Advanced Practice Providers (APPs -  Physician Assistants and Nurse Practitioners) who all work together to provide you with the care you need, when you need it.  We recommend signing up for the patient portal called "MyChart".  Sign up information is provided on this After Visit Summary.  MyChart is used to connect with patients for Virtual Visits (Telemedicine).  Patients are able to view lab/test results, encounter notes, upcoming appointments, etc.  Non-urgent messages can be sent to your provider as well.   To learn more about what you can do with MyChart, go to [email or web address].    Your next appointment with Dr. Fransico Him, MD is Monday, Sept. 23, 2024 at 10:00 AM.

## 2022-05-18 DIAGNOSIS — M25611 Stiffness of right shoulder, not elsewhere classified: Secondary | ICD-10-CM | POA: Diagnosis not present

## 2022-05-18 DIAGNOSIS — M25511 Pain in right shoulder: Secondary | ICD-10-CM | POA: Diagnosis not present

## 2022-05-23 DIAGNOSIS — M25611 Stiffness of right shoulder, not elsewhere classified: Secondary | ICD-10-CM | POA: Diagnosis not present

## 2022-05-23 DIAGNOSIS — M25511 Pain in right shoulder: Secondary | ICD-10-CM | POA: Diagnosis not present

## 2022-05-25 DIAGNOSIS — M25511 Pain in right shoulder: Secondary | ICD-10-CM | POA: Diagnosis not present

## 2022-05-25 DIAGNOSIS — M25611 Stiffness of right shoulder, not elsewhere classified: Secondary | ICD-10-CM | POA: Diagnosis not present

## 2022-05-30 DIAGNOSIS — M25511 Pain in right shoulder: Secondary | ICD-10-CM | POA: Diagnosis not present

## 2022-05-30 DIAGNOSIS — M25611 Stiffness of right shoulder, not elsewhere classified: Secondary | ICD-10-CM | POA: Diagnosis not present

## 2022-06-01 DIAGNOSIS — M25611 Stiffness of right shoulder, not elsewhere classified: Secondary | ICD-10-CM | POA: Diagnosis not present

## 2022-06-01 DIAGNOSIS — M25511 Pain in right shoulder: Secondary | ICD-10-CM | POA: Diagnosis not present

## 2022-06-06 ENCOUNTER — Ambulatory Visit (INDEPENDENT_AMBULATORY_CARE_PROVIDER_SITE_OTHER): Payer: Medicare Other

## 2022-06-06 DIAGNOSIS — M25511 Pain in right shoulder: Secondary | ICD-10-CM | POA: Diagnosis not present

## 2022-06-06 DIAGNOSIS — I420 Dilated cardiomyopathy: Secondary | ICD-10-CM

## 2022-06-06 DIAGNOSIS — M25611 Stiffness of right shoulder, not elsewhere classified: Secondary | ICD-10-CM | POA: Diagnosis not present

## 2022-06-07 ENCOUNTER — Encounter: Payer: Self-pay | Admitting: Cardiology

## 2022-06-07 ENCOUNTER — Other Ambulatory Visit: Payer: Self-pay | Admitting: Cardiology

## 2022-06-07 LAB — CUP PACEART REMOTE DEVICE CHECK
Battery Remaining Longevity: 25 mo
Battery Remaining Percentage: 24 %
Battery Voltage: 2.8 V
Brady Statistic RV Percent Paced: 1 %
Date Time Interrogation Session: 20240423032223
HighPow Impedance: 69 Ohm
HighPow Impedance: 69 Ohm
Implantable Lead Connection Status: 753985
Implantable Lead Implant Date: 20160622
Implantable Lead Location: 753860
Implantable Lead Model: 181
Implantable Lead Serial Number: 331169
Implantable Pulse Generator Implant Date: 20160622
Lead Channel Impedance Value: 360 Ohm
Lead Channel Pacing Threshold Amplitude: 0.75 V
Lead Channel Pacing Threshold Pulse Width: 0.5 ms
Lead Channel Sensing Intrinsic Amplitude: 6.2 mV
Lead Channel Setting Pacing Amplitude: 2.5 V
Lead Channel Setting Pacing Pulse Width: 0.5 ms
Lead Channel Setting Sensing Sensitivity: 0.5 mV
Pulse Gen Serial Number: 7280026
Zone Setting Status: 755011

## 2022-06-07 MED ORDER — CARVEDILOL 25 MG PO TABS: 25.0000 mg | ORAL_TABLET | Freq: Two times a day (BID) | ORAL | 3 refills | Status: DC

## 2022-06-07 MED ORDER — RIVAROXABAN 20 MG PO TABS: 20.0000 mg | ORAL_TABLET | Freq: Every day | ORAL | 2 refills | Status: DC

## 2022-06-07 NOTE — Telephone Encounter (Signed)
Pt last saw Dr Mayford Knife 05/17/22, last labs 05/02/22 Creat 1.36, age 67, weight 93kg, CrCl 70.28, based on CrCl pt is on appropriate dosage of Xarelto  QD for afib.  Will refill rx.

## 2022-06-08 DIAGNOSIS — Z4789 Encounter for other orthopedic aftercare: Secondary | ICD-10-CM | POA: Diagnosis not present

## 2022-06-08 DIAGNOSIS — M25511 Pain in right shoulder: Secondary | ICD-10-CM | POA: Diagnosis not present

## 2022-06-08 DIAGNOSIS — M25611 Stiffness of right shoulder, not elsewhere classified: Secondary | ICD-10-CM | POA: Diagnosis not present

## 2022-06-09 DIAGNOSIS — J069 Acute upper respiratory infection, unspecified: Secondary | ICD-10-CM | POA: Diagnosis not present

## 2022-06-09 DIAGNOSIS — J029 Acute pharyngitis, unspecified: Secondary | ICD-10-CM | POA: Diagnosis not present

## 2022-06-13 DIAGNOSIS — J309 Allergic rhinitis, unspecified: Secondary | ICD-10-CM | POA: Diagnosis not present

## 2022-07-01 ENCOUNTER — Other Ambulatory Visit: Payer: Self-pay | Admitting: Cardiology

## 2022-07-05 NOTE — Progress Notes (Signed)
Remote ICD transmission.   

## 2022-07-06 ENCOUNTER — Encounter: Payer: Self-pay | Admitting: Cardiology

## 2022-07-06 ENCOUNTER — Other Ambulatory Visit (HOSPITAL_COMMUNITY): Payer: Self-pay | Admitting: Cardiology

## 2022-07-07 ENCOUNTER — Telehealth: Payer: Self-pay

## 2022-07-07 DIAGNOSIS — I4819 Other persistent atrial fibrillation: Secondary | ICD-10-CM

## 2022-07-07 MED ORDER — DOFETILIDE 250 MCG PO CAPS: ORAL_CAPSULE | ORAL | 11 refills | Status: DC

## 2022-07-07 NOTE — Telephone Encounter (Signed)
Patient requesting refill of tikosyn via Mychart.

## 2022-07-08 DIAGNOSIS — J0100 Acute maxillary sinusitis, unspecified: Secondary | ICD-10-CM | POA: Diagnosis not present

## 2022-07-17 ENCOUNTER — Telehealth: Payer: Self-pay

## 2022-07-17 NOTE — Telephone Encounter (Signed)
Device alert LeadAssuranceT Alert (V. Noise Reversion) No EGM's, no noise noted on presenting,  Noise reversion programmed off - route to triage for ongoing alerts [place], CVRS   Patient has ongoing Lead Assurance Alert (V. Noise Reversion). There have been no noise events for the past year. Reviewed with ST Jude rep, Bryan.   Past EGM noise episode (1 year ago) cleared but diagnostics were not cleared resulting in ongoing alerts for noise reversion and magnet response.  Patient will need to be in the office for [place] to make programming changes to fix this.  In meantime, did go into [employer] and programmed V Noise Reversion alert off until patient comes in to see [place] in October.  I have added to appointment notes to be be sure and clear diagnostics and re-program V noise alert back (Yellow zone in [place]) on as we do want to continue to monitor for noise given this is an ICD.  FYI to Dr. [name].

## 2022-08-26 DIAGNOSIS — N390 Urinary tract infection, site not specified: Secondary | ICD-10-CM | POA: Diagnosis not present

## 2022-08-31 ENCOUNTER — Other Ambulatory Visit (HOSPITAL_BASED_OUTPATIENT_CLINIC_OR_DEPARTMENT_OTHER): Payer: Self-pay

## 2022-09-05 ENCOUNTER — Ambulatory Visit (INDEPENDENT_AMBULATORY_CARE_PROVIDER_SITE_OTHER): Payer: Medicare Other

## 2022-09-05 DIAGNOSIS — I420 Dilated cardiomyopathy: Secondary | ICD-10-CM | POA: Diagnosis not present

## 2022-09-06 LAB — CUP PACEART REMOTE DEVICE CHECK
Battery Remaining Longevity: 19 mo
Battery Remaining Percentage: 19 %
Battery Voltage: 2.77 V
Brady Statistic RV Percent Paced: 1 %
Date Time Interrogation Session: 20240723020017
HighPow Impedance: 63 Ohm
HighPow Impedance: 63 Ohm
Implantable Lead Connection Status: 753985
Implantable Lead Implant Date: 20160622
Implantable Lead Location: 753860
Implantable Lead Model: 181
Implantable Lead Serial Number: 331169
Implantable Pulse Generator Implant Date: 20160622
Lead Channel Impedance Value: 360 Ohm
Lead Channel Pacing Threshold Amplitude: 0.75 V
Lead Channel Pacing Threshold Pulse Width: 0.5 ms
Lead Channel Sensing Intrinsic Amplitude: 6.7 mV
Lead Channel Setting Pacing Amplitude: 2.5 V
Lead Channel Setting Pacing Pulse Width: 0.5 ms
Lead Channel Setting Sensing Sensitivity: 0.5 mV
Pulse Gen Serial Number: 7280026
Zone Setting Status: 755011

## 2022-09-07 ENCOUNTER — Ambulatory Visit: Payer: Medicare Other | Admitting: Internal Medicine

## 2022-09-07 ENCOUNTER — Ambulatory Visit (INDEPENDENT_AMBULATORY_CARE_PROVIDER_SITE_OTHER): Payer: Medicare Other | Admitting: Internal Medicine

## 2022-09-07 DIAGNOSIS — J672 Bird fancier's lung: Secondary | ICD-10-CM

## 2022-09-07 LAB — PULMONARY FUNCTION TEST
DL/VA % pred: 68 %
DL/VA: 2.79 ml/min/mmHg/L
DLCO cor % pred: 75 %
DLCO cor: 20.7 ml/min/mmHg
DLCO unc % pred: 75 %
DLCO unc: 20.7 ml/min/mmHg
FEF 25-75 Pre: 2.91 L/sec
FEF2575-%Pred-Pre: 107 %
FEV1-%Pred-Pre: 108 %
FEV1-Pre: 3.79 L
FEV1FVC-%Pred-Pre: 98 %
FEV6-%Pred-Pre: 113 %
FEV6-Pre: 5.04 L
FEV6FVC-%Pred-Pre: 104 %
FVC-%Pred-Pre: 109 %
FVC-Pre: 5.16 L
Pre FEV1/FVC ratio: 73 %
Pre FEV6/FVC Ratio: 99 %

## 2022-09-07 NOTE — Patient Instructions (Signed)
Spiro/DLCO performed today. 

## 2022-09-07 NOTE — Progress Notes (Signed)
Spiro/DLCO performed today. 

## 2022-09-13 ENCOUNTER — Encounter (HOSPITAL_COMMUNITY): Payer: Self-pay | Admitting: Cardiology

## 2022-09-13 ENCOUNTER — Ambulatory Visit (HOSPITAL_COMMUNITY)
Admission: RE | Admit: 2022-09-13 | Discharge: 2022-09-13 | Disposition: A | Discharge status: Home / Self Care | Payer: Medicare Other | Source: Ambulatory Visit | Attending: Cardiology | Admitting: Cardiology

## 2022-09-13 VITALS — BP 90/50 | HR 54 | Wt 203.4 lb

## 2022-09-13 DIAGNOSIS — N183 Chronic kidney disease, stage 3 unspecified: Secondary | ICD-10-CM | POA: Insufficient documentation

## 2022-09-13 DIAGNOSIS — I130 Hypertensive heart and chronic kidney disease with heart failure and stage 1 through stage 4 chronic kidney disease, or unspecified chronic kidney disease: Secondary | ICD-10-CM | POA: Insufficient documentation

## 2022-09-13 DIAGNOSIS — I4819 Other persistent atrial fibrillation: Secondary | ICD-10-CM | POA: Diagnosis not present

## 2022-09-13 DIAGNOSIS — I2510 Atherosclerotic heart disease of native coronary artery without angina pectoris: Secondary | ICD-10-CM | POA: Diagnosis not present

## 2022-09-13 DIAGNOSIS — I77810 Thoracic aortic ectasia: Secondary | ICD-10-CM | POA: Insufficient documentation

## 2022-09-13 DIAGNOSIS — I428 Other cardiomyopathies: Secondary | ICD-10-CM | POA: Diagnosis not present

## 2022-09-13 DIAGNOSIS — I5022 Chronic systolic (congestive) heart failure: Secondary | ICD-10-CM | POA: Insufficient documentation

## 2022-09-13 DIAGNOSIS — I493 Ventricular premature depolarization: Secondary | ICD-10-CM | POA: Diagnosis not present

## 2022-09-13 DIAGNOSIS — R9431 Abnormal electrocardiogram [ECG] [EKG]: Secondary | ICD-10-CM | POA: Insufficient documentation

## 2022-09-13 DIAGNOSIS — I480 Paroxysmal atrial fibrillation: Secondary | ICD-10-CM | POA: Diagnosis not present

## 2022-09-13 LAB — BASIC METABOLIC PANEL
Anion gap: 12 (ref 5–15)
BUN: 24 mg/dL — ABNORMAL HIGH (ref 8–23)
CO2: 25 mmol/L (ref 22–32)
Calcium: 9.4 mg/dL (ref 8.9–10.3)
Chloride: 99 mmol/L (ref 98–111)
Creatinine, Ser: 1.5 mg/dL — ABNORMAL HIGH (ref 0.61–1.24)
GFR, Estimated: 51 mL/min — ABNORMAL LOW (ref >60)
Glucose, Bld: 112 mg/dL — ABNORMAL HIGH (ref 70–99)
Potassium: 4.5 mmol/L (ref 3.5–5.1)
Sodium: 136 mmol/L (ref 135–145)

## 2022-09-13 LAB — BRAIN NATRIURETIC PEPTIDE: B Natriuretic Peptide: 81.1 pg/mL (ref 0.0–100.0)

## 2022-09-13 LAB — CBC
HCT: 35.8 % — ABNORMAL LOW (ref 39.0–52.0)
Hemoglobin: 11.1 g/dL — ABNORMAL LOW (ref 13.0–17.0)
MCH: 26.2 pg (ref 26.0–34.0)
MCHC: 31 g/dL (ref 30.0–36.0)
MCV: 84.4 fL (ref 80.0–100.0)
Platelets: 253 10*3/uL (ref 150–400)
RBC: 4.24 MIL/uL (ref 4.22–5.81)
RDW: 16.6 % — ABNORMAL HIGH (ref 11.5–15.5)
WBC: 9.6 10*3/uL (ref 4.0–10.5)
nRBC: 0 % (ref 0.0–0.2)

## 2022-09-13 LAB — MAGNESIUM: Magnesium: 2.4 mg/dL (ref 1.7–2.4)

## 2022-09-13 NOTE — Progress Notes (Signed)
Patient ID: Terry [name], male   DOB: January 30, 1956, 67 y.o.   MRN: [id number] Primary Care: [name], MD Primary Cardiologist: Armanda Magic, MD HF Cardiologist: Dr [name]  HPI: Terry [name] is a 67 y.o. 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 Back in 2006, an echo showed EF 15-20%.  LHC at that time showed normal coronaries.  He has been managed for cardiomyopathy since that time. In 12/14, EF had improved to 50%.  However, he has had a decline since then.  Last echo in 5/16 showed EF 25%.  Atrial fibrillation was first noted in 2013.  In 7/15, he had DCCV to NSR.  In 1/16, he was noted to be back in atrial fibrillation and appears to have been in atrial fibrillation since that time. He had St Jude ICD placed by Dr [name] in 6/16.  Given fall in EF, Lexiscan Cardiolite was done. This showed EF 39% with partially reversible inferior and apical perfusion defect. He had LHC in 7/16 showing no significant CAD.  In 8/16, he was admitted for Tikosyn initiation and converted to NSR.  Echo in 8/18 showed EF 35-40%, mild to moderately decreased RV systolic function.  Echo in 8/19 showed EF 40-45%, mild-moderate MR, mild to moderately decreased RV systolic function.   Holter in 8/19 showed 8% PVCs.   He had right TKR in 10/19 without complications.   Echo in 10/20 showed EF stable at 40-45%. Echo in 12/21 showed EF 35-40%, moderate RV enlargement with normal systolic function, 4.2 cm ascending aorta.   Echo in 12/22 showed EF 40% with mild LV dilation, mild RV dilation, normal RV systolic function, mild-moderate MR.   Patient had calcium score in 10/23, 3129 Agatston units (98th percentile).  He was sent up for cardiac PET, still pending.   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  He got rid of the [place] and was given a course of prednisone, coughing resolved.   Echo  12/23 EF 35%, global hypokinesis, mildly decreased RV systolic function, mild MR, mild AI, normal IVC. Cardiac PET stress test showed mild ischemia in mid anterolateral wall with normal LAD stress flow, low risk from ischemia standpoint.   S/p R shoulder arthroplasty (2/24).  Today he returns for HF follow up with his wife. He is doing well overall.  Went on trip to [place] and [place] in May.  Did a lot of walking without any problems.  No dyspnea with stairs, hills.  He is swimming daily for 30-45 minutes.  No chest pain.  No orthopnea/PND.  No lightheadedness though BP is on the low side. Weight down 6 lbs.    ECG (personally reviewed): NSR, LAFB, septal Qs, QTc 439 msec  St Jude device interrogation (personally reviewed): Stable thoracic impedance, no VT   HIV negative, ferritin normal, immunofixation with no monoclonal protein.  Labs (10/19): K 4.3, creatinine 1.0 Labs (3/21): K 4.7, creatinine 1.52 Labs (9/21): K 4.6, creaetinine 1.57 Labs (12/21): K 4.4, creatinine 1.75 Labs (2/22): LDL 58 Labs (7/22): K 4.5, creatinine 1.55 Labs (12/22): LDL 60, creatinine 1.53 Labs (10/23): LDL 64, Lp(a) 26, LDL particle number 947, K 4.3, creatinine 1.37 Labs (2/24): K 4.2, creatinine 1.13, LDL 43 Labs (3/24): K 4, creatinine 1.36, BNP 48  PMH 1. Nonischemic dilated cardiomyopathy: Echo (2006) with EF 15-20%.  LHC in 10/06 showed normal coronaries.  By 2014, EF had  improved to 50%.  Echo in 1/16 showed EF 35-40%.  Echo (5/16) showed EF 25% with mild MR.  St Jude ICD 6/16.  HIV negative, immunofixation with no monoclonal protein, and ferritin normal.  Lexiscan Cardiolite (7/16) with EF 39% and partially reversible inferior and apical perfusion defect (intermediate risk).   LHC (7/16) with EF 35-40%, no significant CAD.  - Echo (8/17): EF 45-50% - Echo (8/18): EF 35-40%, moderate diastoilc dysfunction, mild AI, mild MR, mild to moderately decreased RV systolic function.   - Echo (8/19): EF 40-45%,  mild-moderate MR, mild to moderately decreased RV systolic function.  - Echo (10/20): EF 40-45%, mild LVH, normal RV, mild MR.  - Echo (12/21): EF 35-40%, moderate RV enlargement with normal systolic function, 4.2 cm ascending aorta.  - Echo (12/22): EF 40% with mild LV dilation, mild RV dilation, normal RV systolic function, mild-moderate MR. - Echo (12/23): EF 35%, global hypokinesis, mildly decreased RV systolic function, mild MR, mild AI, normal IVC. 2. Atrial fibrillation: Paroxysmal.  First noted in 2013.  DCCV in 7/15.  Back in atrial fibrillation 1/16.  Converted back to NSR with Tikosyn in 8/16.  3. HTN 4. Rheumatoid Arthritis: Has been off all medications for several years.   5. H/o bilateral THR 6. Gynecomastia with spironolactone.  7. PVCs: Holter in 8/19 showed 8% PVCs.  8. Ascending aortic aneurysm: 4.2 cm ascending aorta on 12/21 echo.  - CT chest (7/22) with 4.1 cm ascending aorta - CT chest (10/23) with 4.2 can ascending aorta.  9. CAD: Calcium score in 10/23, 3129 Agatston units (98th percentile).  - Cardiac PET stress test (1/24) showed mild ischemia in mid anterolateral wall with normal LAD stress flow, low risk from ischemia standpoint.  10. Hypersensitivity pneumonitis: Down and parakeet.  Resolved with prednisone and avoidance of triggers.   SH: Lives at home with wife and 2 children and a grandchild. Never smoker, drinks 1 or 2 beers a week with dinner. 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   FH: Father with MI at 11, brother with atrial fibrillation, mother with renal failure.   Review of systems complete and found to be negative unless listed in HPI.    Current Outpatient Medications  Medication Sig Dispense Refill   atorvastatin (LIPITOR) 80 MG tablet Take 1 tablet (80 mg total) by mouth daily. 90 tablet 3   carvedilol (COREG) 25 MG tablet Take 1 tablet (25 mg total) by mouth 2 (two) times daily. 180 tablet 3   dapagliflozin propanediol  (FARXIGA) 10 MG TABS tablet TAKE 1 TABLET BY MOUTH DAILY BEFORE BREAKFAST. 90 tablet 1   dofetilide (TIKOSYN) 250 MCG capsule TAKE 1 CAPSULE (250 MCG TOTAL) BY MOUTH 2 TIMES DAILY. PLEASE CALL FOR OFFICE VISIT [phone] 60 capsule 11   eplerenone (INSPRA) 50 MG tablet TAKE 1 TABLET BY MOUTH EVERY DAY 90 tablet 1   furosemide (LASIX) 40 MG tablet TAKE 1 TABLET BY MOUTH EVERY DAY 90 tablet 3   Multiple Vitamin (MULTIVITAMIN WITH MINERALS) TABS tablet Take 1 tablet by mouth in the morning. Centrum Multivitamin     rivaroxaban (XARELTO) 20 MG TABS tablet Take 1 tablet (20 mg total) by mouth daily with supper. 90 tablet 2   sacubitril-valsartan (ENTRESTO) 97-103 MG Take 1 tablet by mouth 2 (two) times daily. 180 tablet 3   No current facility-administered medications for this encounter.   Allergies  Allergen Reactions   Other Other (See Comments)   Spironolactone  Gynecomastia   Sulfa Antibiotics     Thrush and hives   BP (!) 90/50   Pulse (!) 54   Wt 92.3 kg (203 lb 6.4 oz)   SpO2 99%   BMI 28.37 kg/m   Wt Readings from Last 3 Encounters:  09/13/22 92.3 kg (203 lb 6.4 oz)  05/17/22 93 kg (205 lb)  05/02/22 95.2 kg (209 lb 12.8 oz)    PHYSICAL EXAM: General: NAD Neck: No JVD, no thyromegaly or thyroid nodule.  Lungs: Clear to auscultation bilaterally with normal respiratory effort. CV: Nondisplaced PMI.  Heart regular S1/S2, no S3/S4, no murmur.  No peripheral edema.  No carotid bruit.  Normal pedal pulses.  Abdomen: Soft, nontender, no hepatosplenomegaly, no distention.  Skin: Intact without lesions or rashes.  Neurologic: Alert and oriented x 3.  Psych: Normal affect. Extremities: No clubbing or cyanosis.  HEENT: Normal.   ASSESSMENT & PLAN: 1. Chronic systolic HF: Nonischemic cardiomyopathy x years.  He has a Secondary school teacher ICD and is not a candidate for CRT due to narrow QRS.  7/16 LHC showed no significant CAD.  Echo 12/23 EF 35% with mildly decreased RV systolic function.  NYHA class I-II. He is not volume overloaded by exam or [place].  - Continue Lasix 40 mg daily.  BMET/BNP today.  - Continue dapagliflozin 10 mg daily.    - Continue Coreg 25 mg bid.  - Continue Entresto 97/103 bid.   - Continue eplerenone 50 mg daily (gynecomastia on spiro).  2.  Atrial fibrillation: Paroxysmal.  Holding NSR on Tikosyn.  QTc ok on today's ECG.  - Continue Xarelto 20 mg daily. CBC today.  - Continue Tikosyn.  Check BMET and Mg today.  3. PVCs: Holter in 8/19 showed 8% PVCs.  This is unlikely to affect his cardiomyopathy.  Still with occasional PVCs, denies palpitations.  4. CKD: Stage 3.   - Continue dapagliflozin, BMET today.  5. Dilated ascending aorta: 4.2 cm on CT chest in 10/23.  6. Hypersensitivity pneumonitis: Cough resolved after getting rid of down around his house and getting rid of parakeet.  7. CAD: Calcium score 3129, 98th percentile.  Cardiac PET 1/24 showed mild ischemia in mid anterolateral wall with normal LAD stress flow, low risk from ischemia standpoint. He is asymptomatic with no chest pain or exertional dyspnea.  - Continue atorvastatin 80 mg daily, good lipids 2/24 (goal LDL < 55).   Follow up in 6 months with APP.   Marca Ancona, MD  09/13/2022

## 2022-09-13 NOTE — Patient Instructions (Signed)
Good to see you today!  No medication changes  Lab work done today we will call you with any abnormal results  Your physician recommends that you schedule a follow-up appointment in: 6 months(February) Call office in December to schedule an appointment   At the [facility], you and your health needs are our priority. As part of our continuing mission to provide you with exceptional heart care, we have created designated Provider Care Teams. These Care Teams include your primary Cardiologist (physician) and Advanced Practice Providers (APPs- Physician Assistants and Nurse Practitioners) who all work together to provide you with the care you need, when you need it.   You may see any of the following providers on your designated Care Team at your next follow up: Dr Arvilla Meres Dr Marca Ancona Dr. Marcos Eke, NP Robbie Lis, [place] [facility] [place], [place] Brynda Peon, NP Karle Plumber, PharmD   Please be sure to bring in all your medications bottles to every appointment.    Thank you for choosing [facility]  At the [facility], you and your health needs are our priority. As part of our continuing mission to provide you with exceptional heart care, we have created designated Provider Care Teams. These Care Teams include your primary Cardiologist (physician) and Advanced Practice Providers (APPs- Physician Assistants and Nurse Practitioners) who all work together to provide you with the care you need, when you need it.   You may see any of the following providers on your designated Care Team at your next follow up: Dr Arvilla Meres Dr Marca Ancona Dr. Marcos Eke, NP Robbie Lis, [place] [facility] [place], [place] Brynda Peon, NP Karle Plumber, PharmD   Please be sure to bring in all your medications bottles to every appointment.    Thank you  for choosing [facility]

## 2022-09-14 ENCOUNTER — Ambulatory Visit: Payer: Medicare Other | Admitting: Internal Medicine

## 2022-09-14 ENCOUNTER — Encounter: Payer: Self-pay | Admitting: Internal Medicine

## 2022-09-14 VITALS — BP 100/70 | HR 52 | Ht 71.0 in | Wt 204.2 lb

## 2022-09-14 DIAGNOSIS — J672 Bird fancier's lung: Secondary | ICD-10-CM | POA: Diagnosis not present

## 2022-09-14 NOTE — Addendum Note (Signed)
Addended by: Hedda Slade on: 09/14/2022 10:17 AM   Modules accepted: Orders

## 2022-09-14 NOTE — Progress Notes (Signed)
IOV 03/26/2020  Subjective:  Patient ID: Terry [name], male , DOB: 06-Feb-1956 , age 67 y.o. , MRN: [id number] , ADDRESS: [address] [place] [address] PCP [name], MD Patient Care Team: [name], MD as PCP - General (Family Medicine) Quintella Reichert, MD as PCP - Cardiology (Cardiology)  This Provider for this visit: Treatment Team:  Attending Provider: Kalman Shan, MD    03/26/2020 -   Chief Complaint  Patient presents with   Consult     HPI Terry [name] 67 y.o. -non-smoker but with a long history of rheumatoid arthritis.  He is not on any immunomodulators for over 15 years.  He feels his quality of life is better with the immunomodulators.  He keeps himself physically active.  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  He had a CT scan of the chest for this that showed nonspecific nodules.  I personally visualized this and agree with this.  Some of these are scattered groundglass opacities some of them are tree-in-bud.  Particularly in the right lower lobe and also in the esophageal recess.  He denies any postnasal drip or acid reflux.  He denies any shortness of breath.  Of note he has cough that is chronic for the last 1 year.  He has been on Entresto for a few years.  A year ago Dr. Marca Ancona his cardiologist increase the [facility] and therefore his cough got worse.  His cough is rated at a scale of 3 out of 10 both early in the morning and late at night.  Early morning is particularly more.  Nevertheless overall the burden is mild.  There is no sputum production.  There is no shortness of breath or wheezing.    CT Chest data -January 28, 2020 personally visualized.  And agree with the findings.   IMPRESSION: 1. Ascending thoracic aorta approximately 4.2 cm maximal caliber. Recommend annual imaging followup by CTA or MRA. This recommendation follows 2010 ACCF/AHA/AATS/ACR/ASA/SCA/SCAI/SIR/STS/SVM Guidelines for the Diagnosis and  Management of Patients with Thoracic Aortic Disease. Circulation. 2010; 121: U272-Z366. Aortic aneurysm NOS (ICD10-I71.9) 2. Scattered areas of tree-in-bud nodularity at the bases. More focal collection of nodules and ground-glass in the as ago esophageal recess. Findings are likely related to chronic infection. Largest nodule approximately 5 mm. Some of these areas show ground-glass features. Non-contrast chest CT at 3-6 months is recommended. If nodules persist and are stable at that time, consider additional non-contrast chest CT examinations at 2 and 4 years. This recommendation follows the consensus statement: Guidelines for Management of Incidental Pulmonary Nodules Detected on CT Images: From the Fleischner Society 2017; Radiology 2017; 284:228-243. 3. Cholelithiasis without evidence of acute cholecystitis. 4. Aortic atherosclerosis.   Aortic Atherosclerosis (ICD10-I70.0).     Electronically Signed   By: Donzetta Kohut M.D.  OV 09/21/2020  Subjective:  Patient ID: Terry [name], male , DOB: August 16, 1955 , age 40 y.o. , MRN: [id number] , ADDRESS: [address] [place] [address] PCP [name], MD Patient Care Team: [name], MD as PCP - General (Family Medicine) Quintella Reichert, MD as PCP - Cardiology (Cardiology)  This Provider for this visit: Treatment Team:  Attending Provider: Kalman Shan, MD    09/21/2020 -   Chief Complaint  Patient presents with   Follow-up    Pt states he has been doing okay since last visit and denies any concerns.   Follow-up mild chronic cough in the setting of  Entresto Follow-up history of rheumatoid arthritis not on immunomodulators Follow-up nonspecific CT scan infiltrates with multiple lung nodules -no shortness of breath  HPI Jaryan D Brill 67 y.o. -returns for follow-up.  Since I last saw him in May 2022 he went to [place] and [place].  While in [place] mid May 2022 he developed mild COVID infection.  He recovered  from it.  He never had much symptoms.  He feels fine right now.  He just has mild cough because of Entresto.  No shortness of breath.  We went over the CT scan results showing waxing and waning tree-in-bud opacities particular in the lower lobe but overall increased.  Also slight increase in nodularity particularly 7 mm left lower lobe.  He is quite worried about this.  His wife indicated that he worries and is anxious quite a bit.  Explained to him in the setting of recent COVID this could be COVID-related accentuation of abnormalities and we could opt to follow-up in several months to see if things settle down.  He reflected on this.  After this we also discussed about the idea of having a bronchoscopy with lavage right now to rule out any MAI.  We went over the differential diagnosis of MAI, interstitial reticular abnormalities with potential to transform into ILD and rheumatoid nodules.  Based on this he wants to proceed with a bronchoscopy with lavage at this point to rule out any infection.  He is preparing to go to [place] in the end of September 2022.  Therefore he wants all his bases covered.   Risks of pneumothorax, hemothorax, sedation/anesthesia complications such as cardiac or respiratory arrest or hypotension, stroke and bleeding all explained. Benefits of diagnosis but limitations of non-diagnosis also explained. Patient verbalized understanding and wished to proceed.      CT Chest data July 2022    IMPRESSION: 1. Moderate patchy tree-in-bud opacities and scattered centrilobular nodules in the peripheral lungs bilaterally, most prominent at the lung bases, overall increased, although with some waxing and waning behavior. Minimal cylindrical bronchiolectasis at the lung bases. Findings are most compatible with a nonspecific chronic or recurrent infectious or inflammatory bronchiolitis, with the differential including atypical mycobacterial infection (MAI) or  recurrent aspiration. 2. The largest new pulmonary nodule measures 7 mm in the posterior left lower lobe, favor inflammatory. Suggest attention on follow-up chest CT in 6-12 months. 3. 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 Recommend annual imaging followup by CTA or MRA. This recommendation follows 2010 ACCF/AHA/AATS/ACR/ASA/SCA/SCAI/SIR/STS/SVM Guidelines for the Diagnosis and Management of Patients with Thoracic Aortic Disease. Circulation. 2010; 121: Z610-R604. Aortic aneurysm NOS (ICD10-I71.9). 4. Three-vessel coronary atherosclerosis. 5. Cholelithiasis. 6. Aortic Atherosclerosis (ICD10-I70.0).     Electronically Signed   By: Delbert Phenix M.D.   On: 08/17/2020 11:40   Video visit Sept 2022  History of Present Illness: 66 year old male seen for pulmonary consult March 26, 2020 for abnormal CT chest with pulmonary nodules and chronic cough Medical history significant for rheumatoid arthritis, chronic systolic congestive heart failure due to nonischemic cardiomyopathy status post [facility] ICD June 2016.  (Echo 2006 EF 15 to 20%), A. Fib COVID infection May 2022 reported as mild symptoms  Today's video visit is a 6-week follow-up.  Patient is being followed for abnormal CT chest with scattered pulmonary nodules.  And chronic cough in the setting of Entresto.  Patient does have congestive heart failure/cardiomyopathy.  Recent CT chest done August 17, 2020 showed patchy tree-in-bud opacities and scattered nodules maximum at  7 mm.  And some minimal bronchiectasis.  Patient was set up for bronchoscopy with lavage.  This was done on October 05, 2020 airway exam showed minimum clear secretions, normal mucosa and no visible endobronchial lesions.  BAL culture was negative with normal respiratory flora, AFB culture was negative, pneumocystis jiroveci Ag negative, fungal culture negative, cytology identified no malignant cells, benign bronchial cells and pulmonary  macrophages. Patient says overall his breathing is doing okay.  He remains very active.  O2 saturations at home remain around 98% on room air.  Patient swims at least 45 minutes each day.  Denies any significant cough. We reviewed his culture reports.   OV 12/06/2020  Subjective:  Patient ID: Terry [name], male , DOB: July 19, 1955 , age 67 y.o. , MRN: [id number] , ADDRESS: [address] [place] [address] PCP [name], MD Patient Care Team: [name], MD as PCP - General (Family Medicine) Quintella Reichert, MD as PCP - Cardiology (Cardiology)  This Provider for this visit: Treatment Team:  Attending Provider: Kalman Shan, MD    12/06/2020 -   Chief Complaint  Patient presents with   Follow-up    No c/o, follow up on Super D CT    HPI returns for follow Terry [name] 67 y.o. -returns for follow-up.  Presents with his wife.  He just came back from a trip to [place] in [place].  He was walking 5 to 6 miles a day.  Felt really good.  He had bronchoscopy with lavage in August 2022 before this trip and the cultures are negative.  This was reviewed by nurse practitioner.  He now had a follow-up CT scan of the chest that shows significant improvement.  This is reviewed below.  He has no new complaints.  He is up-to-date with his respiratory vaccines.    CT Chest data 12/01/20  IMPRESSION: Decreased size of the LEFT lower lobe pulmonary nodule now 2 mm compatible with a resolving infectious or inflammatory changes.   Patchy tree-in-bud nodularity and subtle ground-glass seen on the previous exam also improved and compatible with improving appearance of sequela of chronic infection.   Three-vessel coronary artery disease.   Mild aneurysmal dilation of the ascending thoracic aorta 4.1 cm is similar to the prior study. Recommend annual imaging followup by CTA or MRA. This recommendation follows 2010 ACCF/AHA/AATS/ACR/ASA/SCA/SCAI/SIR/STS/SVM Guidelines for  the Diagnosis and Management of Patients with Thoracic Aortic Disease. Circulation. 2010; 121: N829-F621. Aortic aneurysm NOS (ICD10-I71.9)   Cholelithiasis.   Aortic Atherosclerosis (ICD10-I70.0).     Electronically Signed   By: Donzetta Kohut M.D.   On: 12/02/2020 08:45  OV 12/06/2021  Subjective:  Patient ID: Terry [name], male , DOB: 13-Feb-1956 , age 66 y.o. , MRN: [id number] , ADDRESS: [address] [place] [address] PCP [name], MD Patient Care Team: [name], MD as PCP - General (Family Medicine) Quintella Reichert, MD as PCP - Cardiology (Cardiology)  This Provider for this visit: Treatment Team:  Attending Provider: Kalman Shan, MD   12/06/2021 -   Chief Complaint  Patient presents with   Follow-up    Follow-up visit PT states no problems with breathing     HPI KATSUJI ESKER 67 y.o. - presents with wife. ROV is a year to date. He is doing well. Minimal symptoms. Had sinus infection a month ago and Rx with doxy. In May 2023 went to [place] and [place]. NExt may 2024 plans to go to [place].Overall  well  other than mild cough early in the moerning attributed to entrerstoe. He does stationary boke an has no dyspnea.  However, on exam there was rLL crackles and on CT - Dr Llana Aliment feels nodularity is worse than last year and features fit in with HP. I also think there is aRLL intestritual infiltrate that is new since last year. So, asking about exposures- he has had 2parakeets x 20 years in kitchen. Wife cleans them but he sits near them in the mornings. Occasinally he cleans them. He also has some down jackets which he likes veyr much  BAL last year showed 25% lymphocytes     Narrative & Impression  CLINICAL DATA:  67 year old male with history of pulmonary nodules. Follow-up study.   EXAM: CT CHEST WITHOUT CONTRAST   TECHNIQUE: Multidetector CT imaging of the chest was performed following the standard protocol without  IV contrast.   RADIATION DOSE REDUCTION: This exam was performed according to the departmental dose-optimization program which includes automated exposure control, adjustment of the mA and/or kV according to patient size and/or use of iterative reconstruction technique.   COMPARISON:  Chest CT 12/01/2020.   FINDINGS: Cardiovascular: Heart size is normal. There is no significant pericardial fluid, thickening or pericardial calcification. There is aortic atherosclerosis, as well as atherosclerosis of the great vessels of the mediastinum and the coronary arteries, including calcified atherosclerotic plaque in the left main, left anterior descending, left circumflex and right coronary arteries. Left-sided pacemaker/AICD with lead tip terminating in the right ventricular apex.   Mediastinum/Nodes: No pathologically enlarged mediastinal or hilar lymph nodes. Please note that accurate exclusion of hilar adenopathy is limited on noncontrast CT scans. Esophagus is unremarkable in appearance. No axillary lymphadenopathy.   Lungs/Pleura: Diffuse bronchial wall thickening, mild thickening of the peribronchovascular interstitium and widespread centrilobular ground-glass attenuation micro nodularity scattered throughout the lungs bilaterally, overall, increased compared to the prior examination. No other definite larger more suspicious appearing pulmonary nodules or masses are noted. No confluent consolidative airspace disease. No pleural effusions.   Upper Abdomen: Numerous calcified gallstones in the gallbladder measuring up to 1.7 cm in diameter. Gallbladder is nearly completely collapsed in otherwise unremarkable in appearance. Atherosclerosis in the abdominal aorta.   Musculoskeletal: There are no aggressive appearing lytic or blastic lesions noted in the visualized portions of the skeleton.   IMPRESSION: 1. 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 Alternatively, mild atypical infection could be considered in the appropriate clinical setting. Further clinical evaluation is recommended. 2. Aortic atherosclerosis, in addition to left main and three-vessel coronary artery disease. Please note that although the presence of coronary artery calcium documents the presence of coronary artery disease, the severity of this disease and any potential stenosis cannot be assessed on this non-gated CT examination. Assessment for potential risk factor modification, dietary therapy or pharmacologic therapy may be warranted, if clinically indicated. 3. Cholelithiasis.   Aortic Atherosclerosis (ICD10-I70.0).     Electronically Signed   By: Trudie Reed M.D.   On: 12/02/2021 07:59   NOv 2023 OV with APP   History of Present Illness: 67 year old male followed for abnormal CT chest with pulmonary nodularity and chronic cough Medical history significant for rheumatoid arthritis, chronic systolic congestive heart failure due to nonischemic cardiomyopathy status post [facility] ICD June 2016 Echo 2006 EF 15 to 20%), A. Fib   Today's video visit is a 1 month follow-up.  Patient was seen last visit for a an  exacerbation of hypersensitivity pneumonitis. Presented with increased cough , sinus drainage , and dyspnea.  Patient history positive for long-term exposure to down coats and parakeets in his home.  Patient was recommended to remove all birds from his home.  And was started on a slow prednisone taper.  He is currently on prednisone 5mg  daily. Has 10 days left of taper.  Lab work showed negative QuantiFERON gold, allergy panel was positive for dust, trees, IgE 86. Elevated absolute eosinophils at 900 .  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   He has gotten rid of all down clothing and pillows  and all birds are gone. Cleaned the house he is feeling much better with resolution of cough . Dyspnea is gone. Going to gym twice a week without any issues. Was able to yard work.    OV 02/21/2022  Subjective:  Patient ID: Terry [name], male , DOB: 1955/10/02 , age 68 y.o. , MRN: [id number] , ADDRESS: [address] [place] [address] PCP [name], MD Patient Care Team: [name], MD as PCP - General (Family Medicine) Quintella Reichert, MD as PCP - Cardiology (Cardiology)  This Provider for this visit: Treatment Team:  Attending Provider: Kalman Shan, MD    02/21/2022 -   Chief Complaint  Patient presents with   Follow-up    Doing well,  Did get birds out of home.  PFT today review    HPI JAMAARI CRULL 67 y.o. -returns for follow-up.  Since his last visit with me we presented him at the multidisciplinary case conference in November 2023.  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  Question was aspiration.  I did talk to him.  He said in the past when he used to eat fast used to choke on food but he has not had this problem for a while.  Because he eats and chews his food slowly.  In the interim he is gotten rid of all organic antigen exposure and this actually helped him.  He does not have any residual cough.  He is not short of breath he is doing swimming and yard work.  He finished the prednisone taper I gave him that this also actually helped him.  He is not having any sinus drainage or wheezing.  He did do the ILD questionnaire and it is as below.  [facility] Integrated Comprehensive ILD Questionnaire  Symptoms:    Past Medical History :  -As below   ROS:  Arthralgia  FAMILY HISTORY of LUNG DISEASE:  No family history of lung disease  PERSONAL EXPOSURE HISTORY:  -His parents smoked when he was a child and there was  passive smoking.  Currently [place] single[place] in the [place] for the last 22 years.  He did have pet bird and he got rid of it after the last visit.  He did get rid of feather pillow/Dubay.  He does do some gardening.  He is retired currently.  He did have some down jackets  -No smoking marijuana or cocaine. HOME  EXPOSURE and HOBBY DETAILS :  x  OCCUPATIONAL HISTORY (122 questions) : -Detail organic and inorganic antigen history is negative  PULMONARY TOXICITY HISTORY (27 items):  BAL. lymphocytosis in the past  INVESTIGATIONS: Multidisciplinary case conference [phone] 2023 notes:  According to Dr. Dorothey Baseman radiologist CT most recent - lot of micronodules. More pronoucned in Lower lungs but also diffuse across both lungs . tree in bud +. No HC. No TB. No AT. No mosaic attenuation.  Currently worse since 2021. Ddx is ASpiration, Follicular bronchiolitis. DR Dorothey Baseman does not think is HP. It is not ILD protocol CT Per Dr Dorothey Baseman  get ILD protocol to look for air trapping. Per Dr Francine Graven ask for aspiration. Cliniccally this is HP but radiologically not sure. Short course steroids ok   OV 09/14/2022  Subjective:  Patient ID: Terry [name], male , DOB: 04/07/1955 , age 59 y.o. , MRN: [id number] , ADDRESS: [address] [place] [address] PCP Noberto Retort, MD Patient Care Team: Noberto Retort, MD as PCP - General (Family Medicine) Quintella Reichert, MD as PCP - Cardiology (Cardiology) Laurey Morale, MD as PCP - Advanced Heart Failure (Cardiology)  This Provider for this visit: Treatment Team:  Attending Provider: Kalman Shan, MD    09/14/2022 -   Chief Complaint  Patient presents with   Follow-up    F/up, no complaints.    Follow-up mild chronic cough in the setting of Entresto - chronis c-CHF - seesm McLEan Follow-up history of rheumatoid arthritis not on immunomodulators Follow-up nonspecific CT scan infiltrates with multiple lung nodules  -no shortness of breath - July 2022  - bronch bal - neg aug 2022. BAL lymphocyte 25%  - CT improved - oct 2022  -High-resolution CT chest October 2023 raise the specter of hypersensitive pneumonitis Follow-up RAST allergy panel + October 2023 Mild covi mid may 2022 in [place]  HPI Royalton D Caporale 67 y.o. -returns for follow-up.  Presents with his wife.  He continues to be asymptomatic.  He had pulmonary function test and this continues to be stable.  He has gotten rid of all his down jackets.  He recently completed a trip to [place] in [place] in May 2024.  He tolerated this trip really well.  Yesterday's heart failure clinic Dr. [name].  I reviewed those notes.  He is stable.  There are no other new issues.  We are going to do 1 year follow-up CT scan in October/November 2024 but we will postpone this given his continued stability.  Additionally the cardiac PET CT scan of the chest in January 2024 did not show any worrisome pulmonary lesions.   CT jan 2024  Lungs/Pleura: No acute pulmonary findings or worrisome pulmonary lesions or nodules. Mild dependent atelectasis/edema but no pleural effusions.   Upper Abdomen: No significant upper abdominal findings. Cholelithiasis again noted along with vascular calcifications.   Musculoskeletal: No significant bony findings.   IMPRESSION: 1. No acute pulmonary findings or worrisome pulmonary lesions or nodules. 2. No mediastinal or hilar mass or adenopathy. 3. Extensive coronary artery calcifications. 4. Cholelithiasis.     Electronically Signed   By: Rudie Meyer M.D.   On: 02/14/2022 11:18   SYMPTOM SCALE - ILD 02/21/2022 09/14/2022   Current weight    O2 use ra ra  Shortness of Breath 0 -> 5 scale with 5 being worst (score 6 If unable to do)   At rest 00 0  Simple tasks - showers, clothes change, eating, shaving 0 0  Household (dishes, doing bed, laundry) 00 0  Shopping 0 0  Walking level at own pace 0 0  Walking up Stairs 0 0  Total  (30-36) Dyspnea Score 0 0      Non-dyspnea symptoms (  0-> 5 scale) 02/21/2022 09/14/2022   How bad is your cough? 00 0  How bad is your fatigue 0 00  How bad is nausea 0 0  How bad is vomiting?  0   How bad is diarrhea? 0 0  How bad is anxiety? 00 0  How bad is depression 0 0  Any chronic pain - if so where and how bad 0 0     PFT    Latest Ref Rng & Units 09/07/2022    3:17 PM 02/21/2022    2:22 PM  PFT Results  FVC-Pre L 5.16  P   FVC-Predicted Pre % 109  P 97   Pre FEV1/FVC % % 73  P 76   FEV1-Pre L 3.79  P 3.52   FEV1-Predicted Pre % 108  P 99   DLCO uncorrected ml/min/mmHg 20.70  P 20.09   DLCO UNC% % 75  P 73   DLCO corrected ml/min/mmHg 20.70  P 21.79   DLCO COR %Predicted % 75  P 79   DLVA Predicted % 68  P 73     P Preliminary result        LAB RESULTS last 96 hours No results found.  LAB RESULTS last 90 days Recent Results (from the past 2160 hour(s))  CUP PACEART REMOTE DEVICE CHECK     Status: None   Collection Time: 09/05/22  2:00 AM  Result Value Ref Range   Date Time Interrogation Session [id number]    Pulse Generator Manufacturer [employer]    Pulse Gen Model 1411-36C Ellipse VR    Pulse Gen Serial Number [id number]    Clinic Name [facility]    Implantable Pulse Generator Type Implantable Cardiac Defibulator    Implantable Pulse Generator Implant Date [id number]    Implantable Lead Manufacturer [employer] [facility]    Implantable Lead Model 0181 Endotak Reliance SG    Implantable Lead Serial Number [id number]    Implantable Lead Implant Date [id number]    Implantable Lead Location Detail 1 APEX    Implantable Lead Location [id number]    Implantable Lead Connection Status [id number]    Lead Channel Setting Sensing Sensitivity 0.5 mV   Lead Channel Setting Sensing Adaptation Mode Adaptive Sensing    Lead Channel Setting Pacing Pulse Width 0.5 ms   Lead Channel Setting Pacing Amplitude 2.5 V   Zone Setting Status Active    Zone Setting Status Inactive    Zone Setting Status  [phone]    Lead Channel Status NULL    Lead Channel Impedance Value 360 ohm   Lead Channel Sensing Intrinsic Amplitude 6.7 mV   Lead Channel Pacing Threshold Amplitude 0.75 V   Lead Channel Pacing Threshold Pulse Width 0.5 ms   HighPow Impedance 63 ohm   HighPow Impedance 63 ohm   HighPow Imped Status NULL    HighPow Imped Status NULL    Battery Status MOS    Battery Remaining Longevity 19 mo   Battery Remaining Percentage 19.0 %   Battery Voltage 2.77 V   Brady Statistic RV Percent Paced 1.0 %  Pulmonary function test     Status: None (Preliminary result)   Collection Time: 09/07/22  3:17 PM  Result Value Ref Range   FVC-Pre 5.16 L   FVC-%Pred-Pre 109 %   FEV1-Pre 3.79 L   FEV1-%Pred-Pre 108 %   FEV6-Pre 5.04 L   FEV6-%Pred-Pre 113 %   Pre FEV1/FVC ratio 73 %   FEV1FVC-%Pred-Pre 98 %   Pre FEV6/FVC Ratio 99 %  FEV6FVC-%Pred-Pre 104 %   FEF 25-75 Pre 2.91 L/sec   FEF2575-%Pred-Pre 107 %   DLCO unc 20.70 ml/min/mmHg   DLCO unc % pred 75 %   DLCO cor 20.70 ml/min/mmHg   DLCO cor % pred 75 %   DL/VA 1.91 ml/min/mmHg/L   DL/VA % pred 68 %  Basic metabolic panel     Status: Abnormal   Collection Time: 09/13/22  9:17 AM  Result Value Ref Range   Sodium 136 135 - 145 mmol/L   Potassium 4.5 3.5 - 5.1 mmol/L   Chloride 99 98 - 111 mmol/L   CO2 25 22 - 32 mmol/L   Glucose, Bld 112 (H) 70 - 99 mg/dL    Comment: Glucose reference range applies only to samples taken after fasting for at least 8 hours.   BUN 24 (H) 8 - 23 mg/dL   Creatinine, Ser 4.78 (H) 0.61 - 1.24 mg/dL   Calcium 9.4 8.9 - 29.5 mg/dL   GFR, Estimated 51 (L) >60 mL/min    Comment: (NOTE) Calculated using the CKD-EPI Creatinine Equation (2021)    Anion gap 12 5 - 15    Comment: Performed at [facility], [address] [place], [place] [address]  CBC     Status: Abnormal   Collection Time: 09/13/22  9:17 AM  Result Value Ref Range   WBC 9.6 4.0 - 10.5 K/uL   RBC 4.24 4.22 - 5.81 MIL/uL   Hemoglobin  11.1 (L) 13.0 - 17.0 g/dL   HCT 86.5 (L) 78.4 - 69.6 %   MCV 84.4 80.0 - 100.0 fL   MCH 26.2 26.0 - 34.0 pg   MCHC 31.0 30.0 - 36.0 g/dL   RDW 29.5 (H) 28.4 - 13.2 %   Platelets 253 150 - 400 K/uL   nRBC 0.0 0.0 - 0.2 %    Comment: Performed at [facility], [address] [place], [place] [address]  B Nat Peptide     Status: None   Collection Time: 09/13/22  9:17 AM  Result Value Ref Range   B Natriuretic Peptide 81.1 0.0 - 100.0 pg/mL    Comment: Performed at [facility], [address], [place], [place] [address]  Magnesium     Status: None   Collection Time: 09/13/22  9:17 AM  Result Value Ref Range   Magnesium 2.4 1.7 - 2.4 mg/dL    Comment: Performed at [facility], [address], [place], [place] [address]         has a past medical history of Agatston coronary artery calcium score greater than 400, AICD (automatic cardioverter/defibrillator) present (08/05/2014), Anemia, Aortic atherosclerosis (HCC), Aortic insufficiency, Ascending aorta dilation (HCC), Chronic systolic CHF (congestive heart failure), NYHA class 1 (HCC), Complication of anesthesia, GERD (gastroesophageal reflux disease), H/O hematuria, H/O hiatal hernia, Hepatitis (1960's), History of blood transfusion (2013; 2014), Hypertension, Nonischemic dilated cardiomyopathy (HCC), PAF (paroxysmal atrial fibrillation) (HCC), PVC (premature ventricular contraction), and Rheumatoid arthritis (HCC) (dx'd 1995).   reports that he has never smoked. He has never used smokeless tobacco.  Past Surgical History:  Procedure Laterality Date   BRONCHIAL WASHINGS  10/05/2020   Procedure: BRONCHIAL WASHINGS;  Surgeon: Kalman Shan, MD;  Location: [facility];  Service: Endoscopy;;   CARDIAC CATHETERIZATION  ~ 2004   normal   CARDIAC CATHETERIZATION N/A 09/07/2014   Procedure: Left Heart Cath and Coronary Angiography;  Surgeon: Laurey Morale, MD;  Location: [facility] INVASIVE CV LAB;  Service: Cardiovascular;   Laterality: N/A;   CARDIAC DEFIBRILLATOR PLACEMENT  08/05/2014   [employer]   CARDIOVERSION N/A 08/21/2013   Procedure: CARDIOVERSION;  Surgeon: Quintella Reichert, MD;  Location: [facility] ENDOS[facility];  Service: Cardiovascular;  Laterality: N/A;   COLONOSCOPY  2010; 2015   "polyps; no polyps"   ELBOW SURGERY Left 2021   Dr. 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    JOINT REPLACEMENT     NASAL SEPTUM SURGERY  ~ 1975   REVERSE SHOULDER ARTHROPLASTY Right 03/24/2022   Procedure: REVERSE SHOULDER ARTHROPLASTY;  Surgeon: Yolonda Kida, MD;  Location: [facility];  Service: Orthopedics;  Laterality: Right;  120   TONSILLECTOMY     TOTAL HIP ARTHROPLASTY  09/08/2011   Procedure: TOTAL HIP ARTHROPLASTY ANTERIOR APPROACH;  Surgeon: [name], MD;  Location: [facility];  Service: Orthopedics;  Laterality: Right;  Right total hip replacement, left knee steroid injection   TOTAL KNEE ARTHROPLASTY Left 11/29/2012   Procedure: LEFT TOTAL KNEE ARTHROPLASTY;  Surgeon: [name], MD;  Location: [facility];  Service: Orthopedics;  Laterality: Left;  FEMORAL NERVE BLOCK IN HOLDING AREA LEFT LEG   TOTAL KNEE ARTHROPLASTY Right 11/30/2017   Procedure: RIGHT TOTAL KNEE ARTHROPLASTY;  Surgeon: [name], MD;  Location: [facility];  Service: Orthopedics;  Laterality: Right;   URETHRAL FISTULA REPAIR  ~ 1996   URETHRAL STRICTURE DILATATION  "several times"   VIDEO BRONCHOSCOPY N/A 10/05/2020   Procedure: VIDEO BRONCHOSCOPY WITHOUT FLUORO;  Surgeon: Kalman Shan, MD;  Location: [facility];  Service: Endoscopy;  Laterality: N/A;    Allergies  Allergen Reactions   Other Other (See Comments)   Spironolactone     Gynecomastia   Sulfa Antibiotics     Thrush and hives    Immunization History   Administered Date(s) Administered   COVID-19, mRNA, vaccine(Comirnaty)12 years and older 12/15/2021   Fluad Quad(high Dose 65+) 10/10/2021   Influenza Split 11/30/2008, 10/26/2011, 10/18/2013, 08/06/2014, 10/11/2014, 10/22/2015, 09/30/2016, 09/28/2018, 09/28/2019   Influenza,inj,Quad PF,6+ Mos 09/30/2016, 10/06/2017, 09/28/2018   Influenza-Unspecified 10/17/2020   Moderna Covid-19 Vaccine Bivalent Booster 29yrs & up 12/15/2021   PFIZER Comirnaty(Gray Top)Covid-19 Tri-Sucrose Vaccine 05/12/2020   PFIZER(Purple Top)SARS-COV-2 Vaccination 04/30/2019, 05/21/2019, 11/23/2019   Pfizer Covid-19 Vaccine Bivalent Booster 72yrs & up 10/24/2020, 06/14/2021   Pneumococcal Polysaccharide-23 08/06/2014   Respiratory Syncytial Virus Vaccine,Recomb Aduvanted(Arexvy) 10/15/2021   Td 12/20/2016   Tdap 11/26/2006   Zoster Recombinant(Shingrix) 10/26/2018, 12/28/2018   Zoster, Live 12/14/2015, 10/26/2018, 12/28/2018    Family History  Problem Relation Age of Onset   Heart disease Father    Heart attack Father 55   Heart failure Brother      Current Outpatient Medications:    atorvastatin (LIPITOR) 80 MG tablet, Take 1 tablet (80 mg total) by mouth daily., Disp: 90 tablet, Rfl: 3   carvedilol (COREG) 25 MG tablet, Take 1 tablet (25 mg total) by mouth 2 (two) times daily., Disp: 180 tablet, Rfl: 3   dapagliflozin propanediol (FARXIGA) 10 MG TABS tablet, TAKE 1 TABLET BY MOUTH DAILY BEFORE BREAKFAST., Disp: 90 tablet, Rfl: 1   dofetilide (TIKOSYN) 250 MCG capsule, TAKE 1 CAPSULE (250 MCG TOTAL) BY MOUTH 2 TIMES DAILY. PLEASE CALL FOR OFFICE VISIT [phone], Disp: 60 capsule, Rfl: 11  eplerenone (INSPRA) 50 MG tablet, TAKE 1 TABLET BY MOUTH EVERY DAY, Disp: 90 tablet, Rfl: 1   furosemide (LASIX) 40 MG tablet, TAKE 1 TABLET BY MOUTH EVERY DAY, Disp: 90 tablet, Rfl: 3   Multiple Vitamin (MULTIVITAMIN WITH MINERALS) TABS tablet, Take 1 tablet by mouth in the morning. Centrum Multivitamin, Disp: , Rfl:     rivaroxaban (XARELTO) 20 MG TABS tablet, Take 1 tablet (20 mg total) by mouth daily with supper., Disp: 90 tablet, Rfl: 2   sacubitril-valsartan (ENTRESTO) 97-103 MG, Take 1 tablet by mouth 2 (two) times daily., Disp: 180 tablet, Rfl: 3      Objective:   Vitals:   09/14/22 0940  BP: 100/70  Pulse: (!) 52  SpO2: 96%  Weight: 204 lb 3.2 oz (92.6 kg)  Height: 5\' 11"  (1.803 m)    Estimated body mass index is 28.48 kg/m as calculated from the following:   Height as of this encounter: 5\' 11"  (1.803 m).   Weight as of this encounter: 204 lb 3.2 oz (92.6 kg).  @WEIGHTCHANGE @  [employer]   09/14/22 0940  Weight: 204 lb 3.2 oz (92.6 kg)     Physical Exam   General: No distress. Looks well O2 at rest: no Cane present: no Sitting in wheel chair: no Frail: no Obese: no Neuro: Alert and Oriented x 3. GCS 15. Speech normal Psych: Pleasant Resp:  Barrel Chest - no.  Wheeze - no, Crackles - no, No overt respiratory distress CVS: Normal heart sounds. 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 PEERL +. No post nasal drip        Assessment:       ICD-10-CM   1. 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 Multiple lung nodules on CT  R91.8     3. ILD (interstitial lung disease) (HCC)  J84.9     4. Long-term exposure involving bird droppings  Z77.29     5. History of rheumatoid arthritis  Z87.39       Glad you are feeling normal after completely getting rid of down jackets and also birds from the indoor environment and after completing steroid course.  Current lung function is essentially normal.  Physical exam is normal.  He had a cardiac CT scan of the chest in January 2024 and in this the lung tissue was described as being clear   Plan -  -Best approach is serial  monitoring  -Cancel CT scan of the chest scheduled for October/Nov 2024 given benign description of the lungs on PET/CT cardiac in January 2024  -Do spirometry and DLCO in 6-9 months   -Follow-up -6-9 months after completing  PFT; 15 min visit  -ILD symptom at follow-up   FOLLOWUP Return in about 8 months (around 05/15/2023) for 15 min visit, ILD, with Dr Marchelle Gearing, Face to Face Visit, after Cleda Daub and DLCO.    SIGNATURE    Dr. Kalman Shan, M.D., F.C.C.P,  Pulmonary and Critical Care Medicine Staff Physician, [facility] Director - Interstitial Lung Disease  Program  Pulmonary Fibrosis [facility] at [facility] [place], [place], [address]  Pager: [phone], If no answer or between  15:00h - 7:00h: call [phone]  Telephone: [phone]  10:07 AM 09/14/2022   Moderate Complexity MDM OFFICE  2021 E/M guidelines, first released in 2021, with minor revisions added in 2023 and 2024 Must meet the requirements for 2 out of 3 dimensions to qualify.    Number and complexity of problems addressed Amount and/or complexity of data reviewed Risk of complications and/or morbidity  One or more chronic illness with mild exacerbation, OR progression, OR  side effects of treatment  Two or more stable chronic illnesses  One undiagnosed new problem with uncertain prognosis  One acute illness with systemic symptoms   One Acute complicated injury Must meet the requirements for 1 of 3 of the categories)  Category 1: Tests and documents, historian  Any combination of 3 of the following:  Assessment requiring an independent historian  Review of prior external note(s) from each unique source  Review of results of each unique test  Ordering of each unique test    Category 2: Interpretation of tests   Independent interpretation of a test performed by another physician/other qualified health care professional (not separately  reported)  Category 3: Discuss management/tests  Discussion of management or test interpretation with external physician/other qualified health care professional/appropriate source (not separately reported) Moderate risk of morbidity from additional diagnostic testing or treatment Examples only:  Prescription drug management  Decision regarding minor surgery with identfied patient or procedure risk factors  Decision regarding elective major surgery without identified patient or procedure risk factors  Diagnosis or treatment significantly limited by social determinants of health             HIGh Complexity  OFFICE   2021 E/M guidelines, first released in 2021, with minor revisions added in 2023. Must meet the requirements for 2 out of 3 dimensions to qualify.    Number and complexity of problems addressed Amount and/or complexity of data reviewed Risk of complications and/or morbidity  Severe exacerbation of chronic illness  Acute or chronic illnesses that may pose a threat to life or bodily function, e.g., multiple trauma, acute MI, pulmonary embolus, severe respiratory distress, progressive rheumatoid arthritis, psychiatric illness with potential threat to self or others, peritonitis, acute renal failure, abrupt change in neurological status Must meet the requirements for 2 of 3 of the categories)  Category 1: Tests and documents, historian  Any combination of 3 of the following:  Assessment requiring an independent historian  Review of prior external note(s) from each unique source  Review of results of each unique test  Ordering of each unique test    Category 2: Interpretation of tests    Independent interpretation of a test performed by another physician/other qualified health care professional (not separately reported)  Category 3: Discuss management/tests  Discussion of management or test interpretation with external physician/other qualified health care  professional/appropriate source (not separately reported)  HIGH risk of morbidity from additional diagnostic testing or treatment Examples only:  Drug therapy requiring intensive monitoring for toxicity  Decision for elective major surgery with identified pateint or procedure risk factors  Decision regarding hospitalization or escalation of level of care  Decision for DNR or to de-escalate care   Parenteral controlled  substances            LEGEND - Independent interpretation involves the interpretation of a test for which there is a CPT code, and an interpretation or report is customary. When a review and interpretation of a test is performed and documented by the provider, but not separately reported (billed),  then this would represent an independent interpretation. This report does not need to conform to the usual standards of a complete report of the test. This does not include interpretation of tests that do not have formal reports such as a complete blood count with differential and blood cultures. Examples would include reviewing a chest radiograph and documenting in the medical record an interpretation, but not separately reporting (billing) the interpretation of the chest radiograph.   An appropriate source includes professionals who are not health care professionals but may be involved in the management of the patient, such as a Clinical research associate, upper officer, case manager or teacher, and does not include discussion with family or informal caregivers.    - SDOH: SDOH are the conditions in the environments where people are born, live, learn, work, play, worship, and age that affect a wide range of health, functioning, and quality-of-life outcomes and risks. (e.g., housing, food insecurity, transportation, etc.). SDOH-related Z codes ranging from Z55-Z65 are the ICD-10-CM diagnosis codes used to document SDOH data Z55 - Problems related to education and literacy Z56 - Problems  related to employment and unemployment Z57 - Occupational exposure to risk factors Z58 - Problems related to physical environment Z59 - Problems related to housing and economic circumstances [phone] - Problems related to social environment [phone] - Problems related to upbringing [phone] - Other problems related to primary support group, including family circumstances Z42 - Problems related to certain psychosocial circumstances Z65 - Problems related to other psychosocial circumstances

## 2022-09-14 NOTE — Patient Instructions (Addendum)
ICD-10-CM   1. 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 Multiple lung nodules on CT  R91.8     3. ILD (interstitial lung disease) (HCC)  J84.9     4. Long-term exposure involving bird droppings  Z77.29     5. History of rheumatoid arthritis  Z87.39       Glad you are feeling normal after completely getting rid of down jackets and also birds from the indoor environment and after completing steroid course.  Current lung function is essentially normal.  Physical exam is normal.  He had a cardiac CT scan of the chest in January 2024 and in this the lung tissue was described as being clear   Plan -  -Best approach is serial monitoring  -Cancel CT scan of the chest scheduled for October/Nov 2024 given benign description of the lungs on PET/CT cardiac in January 2024  -Do spirometry and DLCO in 6-9 months   -Follow-up -6-9 months after completing  PFT; 15 min visit  -ILD symptom at follow-up

## 2022-09-21 NOTE — Progress Notes (Signed)
Remote ICD transmission.   

## 2022-10-04 DIAGNOSIS — N390 Urinary tract infection, site not specified: Secondary | ICD-10-CM | POA: Diagnosis not present

## 2022-10-10 ENCOUNTER — Encounter: Payer: Self-pay | Admitting: Cardiology

## 2022-10-21 ENCOUNTER — Encounter: Payer: Self-pay | Admitting: Cardiology

## 2022-10-31 ENCOUNTER — Encounter: Payer: Self-pay | Admitting: Internal Medicine

## 2022-10-31 ENCOUNTER — Ambulatory Visit: Payer: Medicare Other | Attending: Internal Medicine | Admitting: Internal Medicine

## 2022-10-31 VITALS — BP 118/70 | HR 51 | Ht 71.0 in | Wt 210.8 lb

## 2022-10-31 DIAGNOSIS — I4819 Other persistent atrial fibrillation: Secondary | ICD-10-CM | POA: Diagnosis not present

## 2022-10-31 DIAGNOSIS — I5022 Chronic systolic (congestive) heart failure: Secondary | ICD-10-CM | POA: Diagnosis not present

## 2022-10-31 LAB — CUP PACEART INCLINIC DEVICE CHECK
Battery Remaining Longevity: 20 mo
Brady Statistic RV Percent Paced: 0.71 %
Date Time Interrogation Session: 20240917170523
HighPow Impedance: 72 Ohm
Implantable Lead Connection Status: 753985
Implantable Lead Implant Date: 20160622
Implantable Lead Location: 753860
Implantable Lead Model: 181
Implantable Lead Serial Number: 331169
Implantable Pulse Generator Implant Date: 20160622
Lead Channel Impedance Value: 362.5 Ohm
Lead Channel Pacing Threshold Amplitude: 0.75 V
Lead Channel Pacing Threshold Amplitude: 0.75 V
Lead Channel Pacing Threshold Pulse Width: 0.5 ms
Lead Channel Pacing Threshold Pulse Width: 0.5 ms
Lead Channel Sensing Intrinsic Amplitude: 8.5 mV
Lead Channel Setting Pacing Amplitude: 2.5 V
Lead Channel Setting Pacing Pulse Width: 0.5 ms
Lead Channel Setting Sensing Sensitivity: 0.5 mV
Pulse Gen Serial Number: 7280026
Zone Setting Status: 755011

## 2022-10-31 NOTE — Patient Instructions (Signed)
Medication Instructions:  Your physician recommends that you continue on your current medications as directed. Please refer to the Current Medication list given to you today.  *If you need a refill on your cardiac medications before your next appointment, please call your pharmacy*  Lab Work: None ordered.  If you have labs (blood work) drawn today and your tests are completely normal, you will receive your results only by: MyChart Message (if you have MyChart) OR A paper copy in the mail If you have any lab test that is abnormal or we need to change your treatment, we will call you to review the results.  Testing/Procedures: None ordered.  Follow-Up: At [facility], you and your health needs are our priority.  As part of our continuing mission to provide you with exceptional heart care, we have created designated Provider Care Teams.  These Care Teams include your primary Cardiologist (physician) and Advanced Practice Providers (APPs -  Physician Assistants and Nurse Practitioners) who all work together to provide you with the care you need, when you need it.   Your next appointment:   1 year(s)  The format for your next appointment:   In Person  Provider:   Lewayne Bunting, MD{or one of the following Advanced Practice Providers on your designated Care Team:   Francis Dowse, [place] Casimiro Needle "Mardelle Matte" [place], [place] Earnest Rosier, NP   Important Information About Sugar

## 2022-10-31 NOTE — Progress Notes (Signed)
HPI Terry [name] returns today for followup. He is a pleasant 67 yo man with a non-ischemic CM, persistent atrial fib, s/p ICD insertion. He denies palpitations, chest pain or sob. He has been swimming but stopped a few weeks ago but has gone back to walking. He has not had syncope. No ICD shock.  Allergies  Allergen Reactions   Other Other (See Comments)   Spironolactone     Gynecomastia   Sulfa Antibiotics     Thrush and hives     Current Outpatient Medications  Medication Sig Dispense Refill   atorvastatin (LIPITOR) 80 MG tablet Take 1 tablet (80 mg total) by mouth daily. 90 tablet 3   carvedilol (COREG) 25 MG tablet Take 1 tablet (25 mg total) by mouth 2 (two) times daily. 180 tablet 3   dapagliflozin propanediol (FARXIGA) 10 MG TABS tablet TAKE 1 TABLET BY MOUTH DAILY BEFORE BREAKFAST. 90 tablet 1   dofetilide (TIKOSYN) 250 MCG capsule TAKE 1 CAPSULE (250 MCG TOTAL) BY MOUTH 2 TIMES DAILY. PLEASE CALL FOR OFFICE VISIT [phone] 60 capsule 11   eplerenone (INSPRA) 50 MG tablet TAKE 1 TABLET BY MOUTH EVERY DAY 90 tablet 1   furosemide (LASIX) 40 MG tablet TAKE 1 TABLET BY MOUTH EVERY DAY 90 tablet 3   Multiple Vitamin (MULTIVITAMIN WITH MINERALS) TABS tablet Take 1 tablet by mouth in the morning. Centrum Multivitamin     rivaroxaban (XARELTO) 20 MG TABS tablet Take 1 tablet (20 mg total) by mouth daily with supper. 90 tablet 2   sacubitril-valsartan (ENTRESTO) 97-103 MG Take 1 tablet by mouth 2 (two) times daily. 180 tablet 3   No current facility-administered medications for this visit.     Past Medical History:  Diagnosis Date   Agatston coronary artery calcium score greater than 400    Cor Ca score 3129 on 11/2021   AICD (automatic cardioverter/defibrillator) present 08/05/2014   St Jude Ellipse VR   Anemia    "S/P knee & hip OR"   Aortic atherosclerosis (HCC)    Aortic insufficiency    mild    Ascending aorta dilation (HCC)    42mm by echo and CT 01/2020 and CT  2023   Chronic systolic CHF (congestive heart failure), NYHA class 1 (HCC)    Complication of anesthesia    hx of irregular beat after anesthesia in ~ 1990's   GERD (gastroesophageal reflux disease)    H/O hematuria    H/O hiatal hernia    Hepatitis 1960's   "caught it from my brother"   History of blood transfusion 2013; 2014   S/P knee and hip OR   Hypertension    Nonischemic dilated cardiomyopathy (HCC)    EF 40-45% by echo 2019.  S/P AICD for primary prevention.  Repeat EF 01/2020 35-40%.    PAF (paroxysmal atrial fibrillation) (HCC)    "off and on since 2013" (08/05/2014)   PVC (premature ventricular contraction)    PVC load 8%   Rheumatoid arthritis (HCC) dx'd 1995    ROS:   All systems reviewed and negative except as noted in the HPI.   Past Surgical History:  Procedure Laterality Date   BRONCHIAL WASHINGS  10/05/2020   Procedure: BRONCHIAL WASHINGS;  Surgeon: Kalman Shan, MD;  Location: [facility];  Service: Endoscopy;;   CARDIAC CATHETERIZATION  ~ 2004   normal   CARDIAC CATHETERIZATION N/A 09/07/2014   Procedure: Left Heart Cath and Coronary Angiography;  Surgeon: Laurey Morale, MD;  [facility]: [facility];  Service: Cardiovascular;  Laterality: N/A;   CARDIAC DEFIBRILLATOR PLACEMENT  08/05/2014   St. Jude   CARDIOVERSION N/A 08/21/2013   Procedure: CARDIOVERSION;  Surgeon: Quintella Reichert, MD;  Location: [facility];  Service: Cardiovascular;  Laterality: N/A;   COLONOSCOPY  2010; 2015   "polyps; no polyps"   ELBOW SURGERY Left 2021   Dr. 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    JOINT REPLACEMENT     NASAL SEPTUM SURGERY  ~ 1975   REVERSE SHOULDER ARTHROPLASTY Right 03/24/2022   Procedure: REVERSE SHOULDER ARTHROPLASTY;  Surgeon: Yolonda Kida,  MD;  Location: [facility];  Service: Orthopedics;  Laterality: Right;  120   TONSILLECTOMY     TOTAL HIP ARTHROPLASTY  09/08/2011   Procedure: TOTAL HIP ARTHROPLASTY ANTERIOR APPROACH;  Surgeon: [name], MD;  Location: [facility];  Service: Orthopedics;  Laterality: Right;  Right total hip replacement, left knee steroid injection   TOTAL KNEE ARTHROPLASTY Left 11/29/2012   Procedure: LEFT TOTAL KNEE ARTHROPLASTY;  Surgeon: [name], MD;  Location: [facility];  Service: Orthopedics;  Laterality: Left;  FEMORAL NERVE BLOCK IN HOLDING AREA LEFT LEG   TOTAL KNEE ARTHROPLASTY Right 11/30/2017   Procedure: RIGHT TOTAL KNEE ARTHROPLASTY;  Surgeon: [name], MD;  Location: [facility];  Service: Orthopedics;  Laterality: Right;   URETHRAL FISTULA REPAIR  ~ 1996   URETHRAL STRICTURE DILATATION  "several times"   VIDEO BRONCHOSCOPY N/A 10/05/2020   Procedure: VIDEO BRONCHOSCOPY WITHOUT FLUORO;  Surgeon: Kalman Shan, MD;  [facility];  Service: Endoscopy;  Laterality: N/A;     Family History  Problem Relation Age of Onset   Heart disease Father    Heart attack Father 58   Heart failure Brother      Social History   Socioeconomic History   Marital status: Married    Spouse name: Not on file   Number of children: Not on file   Years of education: Not on file   Highest education level: Not on file  Occupational History   Not on file  Tobacco Use   Smoking status: Never   Smokeless tobacco: Never  Vaping Use   Vaping status: Never Used  Substance and Sexual Activity   Alcohol use: Yes    Alcohol/week: 1.0 standard drink of alcohol    Types: 1 Cans of beer per week    Comment: rare   Drug use: No   Sexual activity: Yes  Other Topics Concern   Not on file  Social History Narrative   Not on file   Social Determinants of Health   Financial Resource Strain: Not on file  Food Insecurity: No Food Insecurity (03/24/2022)   Hunger Vital Sign     Worried About Running Out of Food in the Last Year: Never true    Ran Out of Food in the Last Year: Never true  Transportation Needs: No Transportation Needs (03/24/2022)   PRAPARE - Administrator, Civil Service (Medical): No    Lack of Transportation (Non-Medical): No  Physical Activity: Not on file  Stress: Not on file  Social Connections: Not on file  Intimate Partner Violence: Not At Risk (03/24/2022)   Humiliation, Afraid, Rape, and Kick  questionnaire    Fear of Current or Ex-Partner: No    Emotionally Abused: No    Physically Abused: No    Sexually Abused: No     BP 118/70   Pulse (!) 51   Ht 5\' 11"  (1.803 m)   Wt 210 lb 12.8 oz (95.6 kg)   SpO2 98%   BMI 29.40 kg/m   Physical Exam:  Well appearing NAD HEENT: Unremarkable Neck:  No JVD, no thyromegally Lymphatics:  No adenopathy Back:  No CVA tenderness Lungs:  Clear with no wheezes HEART:  Regular rate rhythm, no murmurs, no rubs, no clicks Abd:  soft, positive bowel sounds, no organomegally, no rebound, no guarding Ext:  2 plus pulses, no edema, no cyanosis, no clubbing Skin:  No rashes no nodules Neuro:  CN II through XII intact, motor grossly intact  EKG - nsr with PVC's  DEVICE  Normal device function.  See PaceArt for details.   Assess/Plan:  1. Chronic systolic heart failure - his symptoms remain class 2A. He will continue his current meds. 2. ICD - his St. Jude single chamber ICD is working normally. 3. Persistent atrial fib - he is maintaining NSR on dofetilide. 4. Coags - he has not had any bleeding on xarelto.     Terry Gowda Anthonyjames Bargar,MD

## 2022-11-06 ENCOUNTER — Ambulatory Visit: Payer: Medicare Other | Admitting: Cardiology

## 2022-11-06 ENCOUNTER — Ambulatory Visit: Payer: Medicare Other | Attending: Cardiology | Admitting: Cardiology

## 2022-11-06 ENCOUNTER — Encounter: Payer: Self-pay | Admitting: Cardiology

## 2022-11-06 VITALS — BP 100/62 | HR 55 | Ht 71.0 in | Wt 204.6 lb

## 2022-11-06 DIAGNOSIS — I77810 Thoracic aortic ectasia: Secondary | ICD-10-CM

## 2022-11-06 DIAGNOSIS — R918 Other nonspecific abnormal finding of lung field: Secondary | ICD-10-CM

## 2022-11-06 DIAGNOSIS — M545 Low back pain, unspecified: Secondary | ICD-10-CM | POA: Diagnosis not present

## 2022-11-06 DIAGNOSIS — M069 Rheumatoid arthritis, unspecified: Secondary | ICD-10-CM | POA: Diagnosis not present

## 2022-11-06 DIAGNOSIS — I480 Paroxysmal atrial fibrillation: Secondary | ICD-10-CM | POA: Diagnosis not present

## 2022-11-06 DIAGNOSIS — E7800 Pure hypercholesterolemia, unspecified: Secondary | ICD-10-CM | POA: Diagnosis not present

## 2022-11-06 DIAGNOSIS — I420 Dilated cardiomyopathy: Secondary | ICD-10-CM | POA: Diagnosis not present

## 2022-11-06 DIAGNOSIS — R931 Abnormal findings on diagnostic imaging of heart and coronary circulation: Secondary | ICD-10-CM | POA: Diagnosis not present

## 2022-11-06 DIAGNOSIS — I10 Essential (primary) hypertension: Secondary | ICD-10-CM | POA: Diagnosis not present

## 2022-11-06 DIAGNOSIS — I493 Ventricular premature depolarization: Secondary | ICD-10-CM | POA: Diagnosis not present

## 2022-11-06 DIAGNOSIS — I5022 Chronic systolic (congestive) heart failure: Secondary | ICD-10-CM

## 2022-11-06 NOTE — Progress Notes (Signed)
Date:  11/06/2022   ID:  [name], DOB Jul 09, 1955, MRN [id number] The patient was identified using 2 identifiers.  PCP:  Noberto Retort, MD   [facility] Providers Cardiologist:  Armanda Magic, MD Advanced Heart Failure:  Marca Ancona, MD     Evaluation Performed:  Follow-Up Visit  Chief Complaint:  HF, HTN, PAF, DCM   History of Present Illness:    Terry [name] is a 67 y.o. 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 Back in 2006, an echo showed EF 15-20%.  LHC at that time showed normal coronaries.  He has been managed for cardiomyopathy since that time.   In 12/14, EF had improved to 50%.  However, he then had a decline with echo in 5/16 showing EF 25%.  Atrial fibrillation was first noted in 2013.  In 7/15, he had DCCV to NSR.  In 1/16, he was noted to be back in atrial fibrillation. He had St Jude ICD placed by Dr [name] in 6/16.  Given fall in EF, Lexiscan Cardiolite was done. This showed EF 39% with partially reversible inferior and apical perfusion defect. He had LHC in 7/16 showing no significant CAD.  In 8/16, he was admitted for Tikosyn initiation and converted to NSR.  Echo in 8/18 showed EF 35-40%, mild to moderately decreased RV systolic function.     Echo in 8/19 showed EF 40-45%, mild-moderate MR, mild to moderately decreased RV systolic function, mild to moderate MR and dilated aortic root at 43mm and ascending aorta 42mm.  Holter showed 8% PVCs by tele monitor.  He is followed by EP for his ICD. Workup for DCM included HIV (neg), ferritin (normal) and no monoclonal Ab on SPEP/UPEP.   He is followed as well by AHF service.    He has a hx of RA and also pulmonary nodules followed by Pulmonary.  HRCT showed hypersensitive pneumonitis.  He is here today for followup and is doing well.  He denies any chest pain or pressure, SOB, DOE, PND, orthopnea, LE edema, dizziness,  palpitations or syncope. He has been swimming a lot at the [facility] as well as working out there. He still works out in the yard and walks.  He is compliant with his meds and is tolerating meds with no SE.    Past Medical History:  Diagnosis Date   Agatston coronary artery calcium score greater than 400    Cor Ca score 3129 on 11/2021   AICD (automatic cardioverter/defibrillator) present 08/05/2014   St Jude Ellipse VR   Anemia    "S/P knee & hip OR"   Aortic atherosclerosis (HCC)    Aortic insufficiency    mild    Ascending aorta dilation (HCC)    42mm by echo and CT 01/2020 and CT 2023   Chronic systolic CHF (congestive heart failure), NYHA class 1 (HCC)    Complication of anesthesia    hx of irregular beat after anesthesia in ~ 1990's   GERD (gastroesophageal reflux disease)    H/O hematuria    H/O hiatal hernia    Hepatitis 1960's   "caught it from my brother"   History of blood transfusion 2013; 2014   S/P knee and hip OR   Hypertension    Nonischemic dilated cardiomyopathy (HCC)    EF 40-45% by echo 2019.  S/P AICD for primary prevention.  Repeat EF 01/2020 35-40%.  PAF (paroxysmal atrial fibrillation) (HCC)    "off and on since 2013" (08/05/2014)   PVC (premature ventricular contraction)    PVC load 8%   Rheumatoid arthritis (HCC) dx'd 1995   Past Surgical History:  Procedure Laterality Date   BRONCHIAL WASHINGS  10/05/2020   Procedure: BRONCHIAL WASHINGS;  Surgeon: Kalman Shan, MD;  [facility];  Service: Endoscopy;;   CARDIAC CATHETERIZATION  ~ 2004   normal   CARDIAC CATHETERIZATION N/A 09/07/2014   Procedure: Left Heart Cath and Coronary Angiography;  Surgeon: Laurey Morale, MD;  [facility] LAB;  Service: Cardiovascular;  Laterality: N/A;   CARDIAC DEFIBRILLATOR PLACEMENT  08/05/2014   St. Jude   CARDIOVERSION N/A 08/21/2013   Procedure: CARDIOVERSION;  Surgeon: Quintella Reichert, MD;  Location: [facility];  Service: Cardiovascular;   Laterality: N/A;   COLONOSCOPY  2010; 2015   "polyps; no polyps"   ELBOW SURGERY Left 2021   Dr. 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    JOINT REPLACEMENT     NASAL SEPTUM SURGERY  ~ 1975   REVERSE SHOULDER ARTHROPLASTY Right 03/24/2022   Procedure: REVERSE SHOULDER ARTHROPLASTY;  Surgeon: Yolonda Kida, MD;  Location: [facility];  Service: Orthopedics;  Laterality: Right;  120   TONSILLECTOMY     TOTAL HIP ARTHROPLASTY  09/08/2011   Procedure: TOTAL HIP ARTHROPLASTY ANTERIOR APPROACH;  Surgeon: [name], MD;  Location: [facility];  Service: Orthopedics;  Laterality: Right;  Right total hip replacement, left knee steroid injection   TOTAL KNEE ARTHROPLASTY Left 11/29/2012   Procedure: LEFT TOTAL KNEE ARTHROPLASTY;  Surgeon: [name], MD;  Location: [facility];  Service: Orthopedics;  Laterality: Left;  FEMORAL NERVE BLOCK IN HOLDING AREA LEFT LEG   TOTAL KNEE ARTHROPLASTY Right 11/30/2017   Procedure: RIGHT TOTAL KNEE ARTHROPLASTY;  Surgeon: [name], MD;  Location: [facility];  Service: Orthopedics;  Laterality: Right;   URETHRAL FISTULA REPAIR  ~ 1996   URETHRAL STRICTURE DILATATION  "several times"   VIDEO BRONCHOSCOPY N/A 10/05/2020   Procedure: VIDEO BRONCHOSCOPY WITHOUT FLUORO;  Surgeon: Kalman Shan, MD;  [facility]: [facility];  Service: Endoscopy;  Laterality: N/A;     Current Meds  Medication Sig   atorvastatin (LIPITOR) 80 MG tablet Take 1 tablet (80 mg total) by mouth daily.   carvedilol (COREG) 25 MG tablet Take 1 tablet (25 mg total) by mouth 2 (two) times daily.   dapagliflozin propanediol (FARXIGA) 10 MG TABS tablet TAKE 1 TABLET BY MOUTH DAILY BEFORE BREAKFAST.   dofetilide (TIKOSYN) 250 MCG capsule TAKE 1 CAPSULE (250 MCG TOTAL) BY  MOUTH 2 TIMES DAILY. PLEASE CALL FOR OFFICE VISIT [phone]   eplerenone (INSPRA) 50 MG tablet TAKE 1 TABLET BY MOUTH EVERY DAY   furosemide (LASIX) 40 MG tablet TAKE 1 TABLET BY MOUTH EVERY DAY   Multiple Vitamin (MULTIVITAMIN WITH MINERALS) TABS tablet Take 1 tablet by mouth in the morning. Centrum Multivitamin   rivaroxaban (XARELTO) 20 MG TABS tablet Take 1 tablet (20 mg total) by mouth daily with supper.   sacubitril-valsartan (ENTRESTO) 97-103 MG Take 1 tablet by mouth 2 (two) times daily.     Allergies:   Other, Spironolactone, and Sulfa antibiotics   Social History   Tobacco Use  Smoking status: Never   Smokeless tobacco: Never  Vaping Use   Vaping status: Never Used  Substance Use Topics   Alcohol use: Yes    Alcohol/week: 1.0 standard drink of alcohol    Types: 1 Cans of beer per week    Comment: rare   Drug use: No     Family Hx: The patient's family history includes Heart attack (age of onset: 20) in his father; Heart disease in his father; Heart failure in his brother.  ROS:   Please see the history of present illness.     All other systems reviewed and are negative.   Prior CV studies:   The following studies were reviewed today:  none  Labs/Other Tests and Data Reviewed:     Recent Labs: 04/03/2022: ALT 16 09/13/2022: B Natriuretic Peptide 81.1; BUN 24; Creatinine, Ser 1.50; Hemoglobin 11.1; Magnesium 2.4; Platelets 253; Potassium 4.5; Sodium 136   Recent Lipid Panel Lab Results  Component Value Date/Time   CHOL 94 04/03/2022 09:04 AM   CHOL 123 12/13/2021 08:19 AM   TRIG 109 04/03/2022 09:04 AM   HDL 29 (L) 04/03/2022 09:04 AM   HDL 42 12/13/2021 08:19 AM   CHOLHDL 3.2 04/03/2022 09:04 AM   LDLCALC 43 04/03/2022 09:04 AM   LDLCALC 64 12/13/2021 08:19 AM    Wt Readings from Last 3 Encounters:  11/06/22 204 lb 9.6 oz (92.8 kg)  10/31/22 210 lb 12.8 oz (95.6 kg)  09/14/22 204 lb 3.2 oz (92.6 kg)     Risk Assessment/Calculations:     CHA2DS2-VASc Score = 4   This indicates a 4.8% annual risk of stroke. The patient's score is based upon: CHF History: 1 HTN History: 1 Diabetes History: 0 Stroke History: 0 Vascular Disease History: 1 Age Score: 1 Gender Score: 0         Objective:    Vital Signs:  BP 100/62   Pulse (!) 55   Ht 5\' 11"  (1.803 m)   Wt 204 lb 9.6 oz (92.8 kg)   SpO2 99%   BMI 28.54 kg/m   GEN: Well nourished, well developed in no acute distress HEENT: Normal NECK: No JVD; No carotid bruits LYMPHATICS: No lymphadenopathy CARDIAC:RRR, no murmurs, rubs, gallops RESPIRATORY:  Clear to auscultation without rales, wheezing or rhonchi  ABDOMEN: Soft, non-tender, non-distended MUSCULOSKELETAL:  No edema; No deformity  SKIN: Warm and dry NEUROLOGIC:  Alert and oriented x 3 PSYCHIATRIC:  Normal affect  ASSESSMENT & PLAN:    1.  Chronic systolic CHF -  -He has a St Jude ICD and is not a candidate for CRT due to narrow QRS.  7/16 LHC showed no significant CAD.  Echo 12/23 EF 35% with mildly decreased RV systolic function.  He has stable NHYA Class I symptoms.   -He remains active working out in his yard and walking several miles daily. -He appears euvolemic on exam.  His weight is stable and he denies any shortness of breath.  His chronic lower extremity edema due to chronic venous insufficiency is stable.   Etiology of HF: Nonischemic NYHA class / AHA Stage: NYHA 1 Volume status & Diuretics: Appears euvolemic on Lasix 40 mg daily Vasodilators: continue on Entresto 97-23 mg twice daily with as needed refills Beta-Blocker: Continue carvedilol 25 mg twice daily with as needed refills MRA: Continue eplerenone 50 mg daily with as needed refills (gynecomastia on spironolactone) Cardiometabolic: Continue SGLT2i with Farxiga 10 mg daily with as needed refills Devices therapies & Valvulopathies: St  Jude ICD (not a candidate for CRT due to narrow QRS complex) -I have personally reviewed and interpreted  outside labs performed by patient's PCP which showed serum creatinine 1.5 and potassium 4.5 on 09/13/2019   2.  HTN  -BP is adequately controlled on exam today -Continue drug management with carvedilol 25 mg twice daily, Entresto 97-23 mg twice daily and eplerenone 50 mg daily with as needed refills   3.  Paroxysmal atrial fibrillation  -He is made a normal sinus rhythm on exam and denies any palpitations since he was seen last.  He has not had any bleeding problems on DOAC. -Continue prescription drug management with Tikosyn 250 mcg twice daily, carvedilol 25 mg twice daily and Xarelto 20 mg daily with as needed refills for CHADS2VASC score of 3 (HTN, CHF and atherosclerosis of the aorta) -I have personally reviewed and interpreted outside labs performed by patient's PCP which showed hemoglobin 11.1 on 09/13/2022  4.  PVCs  - Holter monitor showed 8% PVC load and therefore likely not the etiology of his DCM. -He did not has any palpitations on carvedilol -Continue prescription drug management with carvedilol 25 mg twice daily with as needed refills   5.  Nonischemic DCM  -Workup for secondary causes were negative (HIV/monoclonal Ab/normal ferritin).  -Cardiac cath 08/2014 with no CAD. -S/P AICD for primary prevention followed in device clinic by Dr. [name] -last seen by Dr. [name] in device clinic 10/31/2022 with no ventricular arrhythmias noted on device -Not a candidate for CRT due to narrow QRS.   6.  Dilated aortic root and ascending aorta -mildly dilated on echo 01/2020 at 42mm and 40mm by echo 01/2021 -repeat echo 01/2022 showed 43 mm aortic root and 41 mm ascending aorta which is slightly increased from prior evaluation -I chest CT 12/02/2021 with ascending aorta measuring 43 mm   7.  HLD -LDL goal < 70  -I have personally reviewed and interpreted outside labs performed by patient's PCP which showed LDL 43 and HDL 29 on 04/03/2022 -Continue drug management atorvastatin 20 mg daily  with as needed refills for   8.  RA/Pulmonary nodules -he has pulmonary nodules followed by Pulmonary -PASP was normal on echo 01/2022  9.  Coronary artery calcifications -coronary Ca score 3129 -Cardiac PET 1/24 showed mild ischemia in mid anterolateral wall with normal LAD stress flow, low risk from ischemia standpoint.  -Denies any anginal symptoms since I saw him last -no CAD in 2016 -Continue  statin therapy -No aspirin due to DOAC   Time:   Today, I have spent 20 minutes with the patient with telehealth technology discussing the above problems.     Medication Adjustments/Labs and Tests Ordered: Current medicines are reviewed at length with the patient today.  Concerns regarding medicines are outlined above.   Tests Ordered: No orders of the defined types were placed in this encounter.   Medication Changes: No orders of the defined types were placed in this encounter.   Follow Up:  In Person 6 months  Signed, Armanda Magic, MD  11/06/2022 10:36 AM    [facility]

## 2022-11-06 NOTE — Patient Instructions (Signed)
Medication Instructions:   Your physician recommends that you continue on your current medications as directed. Please refer to the Current Medication list given to you today.  *If you need a refill on your cardiac medications before your next appointment, please call your pharmacy*     Follow-Up: At [facility], you and your health needs are our priority.  As part of our continuing mission to provide you with exceptional heart care, we have created designated Provider Care Teams.  These Care Teams include your primary Cardiologist (physician) and Advanced Practice Providers (APPs -  Physician Assistants and Nurse Practitioners) who all work together to provide you with the care you need, when you need it.  We recommend signing up for the patient portal called "MyChart".  Sign up information is provided on this After Visit Summary.  MyChart is used to connect with patients for Virtual Visits (Telemedicine).  Patients are able to view lab/test results, encounter notes, upcoming appointments, etc.  Non-urgent messages can be sent to your provider as well.   To learn more about what you can do with MyChart, go to [email or web address].    Your next appointment:   6 month(s)  Provider:   Armanda Magic, MD

## 2022-11-12 ENCOUNTER — Other Ambulatory Visit: Payer: Self-pay | Admitting: Cardiology

## 2022-11-15 ENCOUNTER — Encounter (HOSPITAL_COMMUNITY): Payer: Self-pay | Admitting: Cardiology

## 2022-11-15 MED ORDER — DAPAGLIFLOZIN PROPANEDIOL 10 MG PO TABS: 10.0000 mg | ORAL_TABLET | Freq: Every day | ORAL | 1 refills | Status: DC

## 2022-11-27 ENCOUNTER — Encounter: Payer: Medicare Other | Admitting: Internal Medicine

## 2022-12-05 ENCOUNTER — Ambulatory Visit (INDEPENDENT_AMBULATORY_CARE_PROVIDER_SITE_OTHER): Payer: Medicare Other

## 2022-12-05 DIAGNOSIS — I5022 Chronic systolic (congestive) heart failure: Secondary | ICD-10-CM | POA: Diagnosis not present

## 2022-12-05 DIAGNOSIS — I420 Dilated cardiomyopathy: Secondary | ICD-10-CM

## 2022-12-06 LAB — CUP PACEART REMOTE DEVICE CHECK
Battery Remaining Longevity: 18 mo
Battery Remaining Percentage: 18 %
Battery Voltage: 2.75 V
Brady Statistic RV Percent Paced: 2.1 %
Date Time Interrogation Session: 20241022020017
HighPow Impedance: 68 Ohm
HighPow Impedance: 68 Ohm
Implantable Lead Connection Status: 753985
Implantable Lead Implant Date: 20160622
Implantable Lead Location: 753860
Implantable Lead Model: 181
Implantable Lead Serial Number: 331169
Implantable Pulse Generator Implant Date: 20160622
Lead Channel Impedance Value: 360 Ohm
Lead Channel Pacing Threshold Amplitude: 0.75 V
Lead Channel Pacing Threshold Pulse Width: 0.5 ms
Lead Channel Sensing Intrinsic Amplitude: 7.9 mV
Lead Channel Setting Pacing Amplitude: 2.5 V
Lead Channel Setting Pacing Pulse Width: 0.5 ms
Lead Channel Setting Sensing Sensitivity: 0.5 mV
Pulse Gen Serial Number: 7280026
Zone Setting Status: 755011

## 2022-12-22 NOTE — Progress Notes (Signed)
Remote ICD transmission.   

## 2023-01-01 DIAGNOSIS — R300 Dysuria: Secondary | ICD-10-CM | POA: Diagnosis not present

## 2023-01-01 DIAGNOSIS — R81 Glycosuria: Secondary | ICD-10-CM | POA: Diagnosis not present

## 2023-01-01 DIAGNOSIS — N3000 Acute cystitis without hematuria: Secondary | ICD-10-CM | POA: Diagnosis not present

## 2023-01-19 DIAGNOSIS — J679 Hypersensitivity pneumonitis due to unspecified organic dust: Secondary | ICD-10-CM | POA: Diagnosis not present

## 2023-01-19 DIAGNOSIS — I480 Paroxysmal atrial fibrillation: Secondary | ICD-10-CM | POA: Diagnosis not present

## 2023-01-19 DIAGNOSIS — R7303 Prediabetes: Secondary | ICD-10-CM | POA: Diagnosis not present

## 2023-01-19 DIAGNOSIS — K219 Gastro-esophageal reflux disease without esophagitis: Secondary | ICD-10-CM | POA: Diagnosis not present

## 2023-01-19 DIAGNOSIS — I5020 Unspecified systolic (congestive) heart failure: Secondary | ICD-10-CM | POA: Diagnosis not present

## 2023-01-19 DIAGNOSIS — M069 Rheumatoid arthritis, unspecified: Secondary | ICD-10-CM | POA: Diagnosis not present

## 2023-01-19 DIAGNOSIS — I10 Essential (primary) hypertension: Secondary | ICD-10-CM | POA: Diagnosis not present

## 2023-01-19 DIAGNOSIS — I429 Cardiomyopathy, unspecified: Secondary | ICD-10-CM | POA: Diagnosis not present

## 2023-01-19 DIAGNOSIS — I700 Atherosclerosis of aorta: Secondary | ICD-10-CM | POA: Diagnosis not present

## 2023-01-19 DIAGNOSIS — Z0000 Encounter for general adult medical examination without abnormal findings: Secondary | ICD-10-CM | POA: Diagnosis not present

## 2023-01-19 DIAGNOSIS — D6869 Other thrombophilia: Secondary | ICD-10-CM | POA: Diagnosis not present

## 2023-01-26 ENCOUNTER — Other Ambulatory Visit (HOSPITAL_COMMUNITY): Payer: Self-pay | Admitting: Cardiology

## 2023-02-01 DIAGNOSIS — H02834 Dermatochalasis of left upper eyelid: Secondary | ICD-10-CM | POA: Diagnosis not present

## 2023-02-01 DIAGNOSIS — H02831 Dermatochalasis of right upper eyelid: Secondary | ICD-10-CM | POA: Diagnosis not present

## 2023-02-01 DIAGNOSIS — H5213 Myopia, bilateral: Secondary | ICD-10-CM | POA: Diagnosis not present

## 2023-02-08 ENCOUNTER — Encounter: Payer: Medicare Other | Admitting: Internal Medicine

## 2023-02-08 ENCOUNTER — Other Ambulatory Visit (HOSPITAL_COMMUNITY): Payer: Self-pay | Admitting: Cardiology

## 2023-02-08 ENCOUNTER — Other Ambulatory Visit: Payer: Self-pay | Admitting: Cardiology

## 2023-02-08 DIAGNOSIS — I480 Paroxysmal atrial fibrillation: Secondary | ICD-10-CM

## 2023-02-08 NOTE — Telephone Encounter (Signed)
Prescription refill request for Xarelto received.  Indication: Afib  Last office visit: 11/06/22 Mayford Knife)  Weight: 92.8kg Age: 67 Scr: 1.50 (09/13/22)  CrCl: 62.66ml/min  Appropriate dose. Refill sent.

## 2023-02-12 ENCOUNTER — Other Ambulatory Visit (HOSPITAL_COMMUNITY): Payer: Self-pay | Admitting: Cardiology

## 2023-03-06 ENCOUNTER — Ambulatory Visit (INDEPENDENT_AMBULATORY_CARE_PROVIDER_SITE_OTHER): Payer: Medicare Other

## 2023-03-06 DIAGNOSIS — I420 Dilated cardiomyopathy: Secondary | ICD-10-CM | POA: Diagnosis not present

## 2023-03-06 DIAGNOSIS — I5022 Chronic systolic (congestive) heart failure: Secondary | ICD-10-CM

## 2023-03-06 LAB — CUP PACEART REMOTE DEVICE CHECK
Battery Remaining Longevity: 16 mo
Battery Remaining Percentage: 15 %
Battery Voltage: 2.74 V
Brady Statistic RV Percent Paced: 1 %
Date Time Interrogation Session: 20250121020016
HighPow Impedance: 69 Ohm
HighPow Impedance: 69 Ohm
Implantable Lead Connection Status: 753985
Implantable Lead Implant Date: 20160622
Implantable Lead Location: 753860
Implantable Lead Model: 181
Implantable Lead Serial Number: 331169
Implantable Pulse Generator Implant Date: 20160622
Lead Channel Impedance Value: 380 Ohm
Lead Channel Pacing Threshold Amplitude: 0.75 V
Lead Channel Pacing Threshold Pulse Width: 0.5 ms
Lead Channel Sensing Intrinsic Amplitude: 7 mV
Lead Channel Setting Pacing Amplitude: 2.5 V
Lead Channel Setting Pacing Pulse Width: 0.5 ms
Lead Channel Setting Sensing Sensitivity: 0.5 mV
Pulse Gen Serial Number: 7280026
Zone Setting Status: 755011

## 2023-03-09 DIAGNOSIS — R300 Dysuria: Secondary | ICD-10-CM | POA: Diagnosis not present

## 2023-03-20 ENCOUNTER — Encounter (HOSPITAL_COMMUNITY): Payer: Self-pay

## 2023-03-20 ENCOUNTER — Ambulatory Visit (HOSPITAL_COMMUNITY)
Admission: RE | Admit: 2023-03-20 | Discharge: 2023-03-20 | Disposition: A | Discharge status: Home / Self Care | Payer: Medicare Other | Source: Ambulatory Visit | Attending: Family Medicine | Admitting: Family Medicine

## 2023-03-20 VITALS — BP 116/80 | HR 61 | Wt 210.6 lb

## 2023-03-20 DIAGNOSIS — I77810 Thoracic aortic ectasia: Secondary | ICD-10-CM | POA: Diagnosis not present

## 2023-03-20 DIAGNOSIS — Z79899 Other long term (current) drug therapy: Secondary | ICD-10-CM | POA: Diagnosis not present

## 2023-03-20 DIAGNOSIS — N183 Chronic kidney disease, stage 3 unspecified: Secondary | ICD-10-CM

## 2023-03-20 DIAGNOSIS — Z95810 Presence of automatic (implantable) cardiac defibrillator: Secondary | ICD-10-CM | POA: Diagnosis not present

## 2023-03-20 DIAGNOSIS — I480 Paroxysmal atrial fibrillation: Secondary | ICD-10-CM | POA: Diagnosis not present

## 2023-03-20 DIAGNOSIS — I130 Hypertensive heart and chronic kidney disease with heart failure and stage 1 through stage 4 chronic kidney disease, or unspecified chronic kidney disease: Secondary | ICD-10-CM | POA: Diagnosis not present

## 2023-03-20 DIAGNOSIS — Z4502 Encounter for adjustment and management of automatic implantable cardiac defibrillator: Secondary | ICD-10-CM | POA: Diagnosis not present

## 2023-03-20 DIAGNOSIS — I5022 Chronic systolic (congestive) heart failure: Secondary | ICD-10-CM

## 2023-03-20 DIAGNOSIS — I493 Ventricular premature depolarization: Secondary | ICD-10-CM | POA: Diagnosis not present

## 2023-03-20 DIAGNOSIS — Z7901 Long term (current) use of anticoagulants: Secondary | ICD-10-CM | POA: Insufficient documentation

## 2023-03-20 DIAGNOSIS — I4819 Other persistent atrial fibrillation: Secondary | ICD-10-CM | POA: Insufficient documentation

## 2023-03-20 DIAGNOSIS — I2510 Atherosclerotic heart disease of native coronary artery without angina pectoris: Secondary | ICD-10-CM | POA: Diagnosis not present

## 2023-03-20 DIAGNOSIS — Z8249 Family history of ischemic heart disease and other diseases of the circulatory system: Secondary | ICD-10-CM | POA: Insufficient documentation

## 2023-03-20 LAB — LIPID PANEL
Cholesterol: 89 mg/dL (ref 0–200)
HDL: 27 mg/dL — ABNORMAL LOW
LDL Cholesterol: 42 mg/dL (ref 0–99)
Total CHOL/HDL Ratio: 3.3 ratio
Triglycerides: 101 mg/dL
VLDL: 20 mg/dL (ref 0–40)

## 2023-03-20 LAB — CBC
HCT: 38.9 % — ABNORMAL LOW (ref 39.0–52.0)
Hemoglobin: 12.7 g/dL — ABNORMAL LOW (ref 13.0–17.0)
MCH: 28.3 pg (ref 26.0–34.0)
MCHC: 32.6 g/dL (ref 30.0–36.0)
MCV: 86.6 fL (ref 80.0–100.0)
Platelets: 235 K/uL (ref 150–400)
RBC: 4.49 MIL/uL (ref 4.22–5.81)
RDW: 13.7 % (ref 11.5–15.5)
WBC: 10 K/uL (ref 4.0–10.5)
nRBC: 0 % (ref 0.0–0.2)

## 2023-03-20 LAB — MAGNESIUM: Magnesium: 2.3 mg/dL (ref 1.7–2.4)

## 2023-03-20 LAB — BASIC METABOLIC PANEL WITH GFR
Anion gap: 10 (ref 5–15)
BUN: 26 mg/dL — ABNORMAL HIGH (ref 8–23)
CO2: 26 mmol/L (ref 22–32)
Calcium: 9.2 mg/dL (ref 8.9–10.3)
Chloride: 101 mmol/L (ref 98–111)
Creatinine, Ser: 1.37 mg/dL — ABNORMAL HIGH (ref 0.61–1.24)
GFR, Estimated: 57 mL/min — ABNORMAL LOW
Glucose, Bld: 100 mg/dL — ABNORMAL HIGH (ref 70–99)
Potassium: 4.2 mmol/L (ref 3.5–5.1)
Sodium: 137 mmol/L (ref 135–145)

## 2023-03-20 NOTE — Patient Instructions (Addendum)
 Medication Changes:  No Changes In Medications at this time.   Lab Work:  Labs done today, your results will be available in MyChart, we will contact you for abnormal readings.  Follow-Up in: 4 months with an Echo---PLEASE CALL OUR OFFICE AROUND LATE MARCH/APRIL TO GET SCHEDULED FOR YOUR APPOINTMENT. PHONE NUMBER IS [phone] OPTION 2   At the [facility], you and your health needs are our priority. We have a designated team specialized in the treatment of Heart Failure. This Care Team includes your primary Heart Failure Specialized Cardiologist (physician), Advanced Practice Providers (APPs- Physician Assistants and Nurse Practitioners), and Pharmacist who all work together to provide you with the care you need, when you need it.   You may see any of the following providers on your designated Care Team at your next follow up:  Dr. Toribio Fuel Dr. Ezra Shuck Dr. Ria Commander Dr. Odis Brownie Greig Mosses, NP Caffie Shed, [place] [employer] [place], [place] Beckey Coe, NP Jordan Lee, NP Tinnie Redman, PharmD   Please be sure to bring in all your medications bottles to every appointment.   Need to Contact Us :  If you have any questions or concerns before your next appointment please send us  a message through Farmer or call our office at [phone].    TO LEAVE A MESSAGE FOR THE NURSE SELECT OPTION 2, PLEASE LEAVE A MESSAGE INCLUDING: YOUR NAME DATE OF BIRTH CALL BACK NUMBER REASON FOR CALL**this is important as we prioritize the call backs  YOU WILL RECEIVE A CALL BACK THE SAME DAY AS LONG AS YOU CALL BEFORE 4:00 PM

## 2023-03-20 NOTE — Progress Notes (Signed)
 Patient ID: Terry [name], male   DOB: January 09, 1956, 68 y.o.   MRN: [id number]  Primary Care: Elsie Lesches, MD Primary Cardiologist: Wilbert Bihari, MD HF Cardiologist: Dr Rolan  HPI: Terry [name] is a 68 y.o. 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 Back in 2006, an echo showed EF 15-20%.  LHC at that time showed normal coronaries.  He has been managed for cardiomyopathy since that time. In 12/14, EF had improved to 50%.  However, he has had a decline since then.  Last echo in 5/16 showed EF 25%.  Atrial fibrillation was first noted in 2013.  In 7/15, he had DCCV to NSR.  In 1/16, he was noted to be back in atrial fibrillation and appears to have been in atrial fibrillation since that time. He had St Jude ICD placed by Dr Waddell in 6/16.  Given fall in EF, Lexiscan  Cardiolite  was done. This showed EF 39% with partially reversible inferior and apical perfusion defect. He had LHC in 7/16 showing no significant CAD.  In 8/16, he was admitted for Tikosyn  initiation and converted to NSR.  Echo in 8/18 showed EF 35-40%, mild to moderately decreased RV systolic function.  Echo in 8/19 showed EF 40-45%, mild-moderate MR, mild to moderately decreased RV systolic function.   Holter in 8/19 showed 8% PVCs.   He had right TKR in 10/19 without complications.   Echo in 10/20 showed EF stable at 40-45%. Echo in 12/21 showed EF 35-40%, moderate RV enlargement with normal systolic function, 4.2 cm ascending aorta.   Echo in 12/22 showed EF 40% with mild LV dilation, mild RV dilation, normal RV systolic function, mild-moderate MR.   Patient had calcium  score in 10/23, 3129 Agatston units (98th percentile).  He was sent up for cardiac PET, still pending.   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  He got rid of the parakeet and was given a course of prednisone , coughing resolved.   Echo  12/23 EF 35%, global hypokinesis, mildly decreased RV systolic function, mild MR, mild AI, normal IVC. Cardiac PET stress test showed mild ischemia in mid anterolateral wall with normal LAD stress flow, low risk from ischemia standpoint.   S/p R shoulder arthroplasty (2/24).  Today he returns for HF follow up with his wife. Overall feeling fine. Works out at [employer], swims and does bellsouth and cardio with no SOB. Denies  palpitations, abnormal bleeding, CP, dizziness, edema, or PND/Orthopnea. Appetite ok. No fever or chills. Weight at home 205 pounds. Taking all medications. Going to [place] in May and then [place] in October.  ECG (personally reviewed): SB 55 bpm, QTc 455 msec  St Jude device interrogation (personally reviewed): Stable thoracic impedance, no VT   HIV negative, ferritin normal, immunofixation with no monoclonal protein.  Labs (10/23): LDL 64, Lp(a) 26, LDL particle number 947, K 4.3, creatinine 1.37 Labs (2/24): K 4.2, creatinine 1.13, LDL 43 Labs (3/24): K 4, creatinine 1.36, BNP 48 Labs (7/24): K 4.5, creatinine 1.5  PMH 1. Nonischemic dilated cardiomyopathy: Echo (2006) with EF 15-20%.  LHC in 10/06 showed normal coronaries.  By 2014, EF had improved to 50%.  Echo in 1/16 showed EF 35-40%.  Echo (5/16) showed EF 25% with mild MR.  St Jude ICD 6/16.  HIV negative, immunofixation with no monoclonal protein, and ferritin normal.  Lexiscan  Cardiolite  (7/16) with EF 39% and  partially reversible inferior and apical perfusion defect (intermediate risk).   LHC (7/16) with EF 35-40%, no significant CAD.  - Echo (8/17): EF 45-50% - Echo (8/18): EF 35-40%, moderate diastoilc dysfunction, mild AI, mild MR, mild to moderately decreased RV systolic function.   - Echo (8/19): EF 40-45%, mild-moderate MR, mild to moderately decreased RV systolic function.  - Echo (10/20): EF 40-45%, mild LVH, normal RV, mild MR.  - Echo (12/21): EF 35-40%, moderate RV enlargement with normal  systolic function, 4.2 cm ascending aorta.  - Echo (12/22): EF 40% with mild LV dilation, mild RV dilation, normal RV systolic function, mild-moderate MR. - Echo (12/23): EF 35%, global hypokinesis, mildly decreased RV systolic function, mild MR, mild AI, normal IVC. 2. Atrial fibrillation: Paroxysmal.  First noted in 2013.  DCCV in 7/15.  Back in atrial fibrillation 1/16.  Converted back to NSR with Tikosyn  in 8/16.  3. HTN 4. Rheumatoid Arthritis: Has been off all medications for several years.   5. H/o bilateral THR 6. Gynecomastia with spironolactone .  7. PVCs: Holter in 8/19 showed 8% PVCs.  8. Ascending aortic aneurysm: 4.2 cm ascending aorta on 12/21 echo.  - CT chest (7/22) with 4.1 cm ascending aorta - CT chest (10/23) with 4.2 can ascending aorta.  9. CAD: Calcium  score in 10/23, 3129 Agatston units (98th percentile).  - Cardiac PET stress test (1/24) showed mild ischemia in mid anterolateral wall with normal LAD stress flow, low risk from ischemia standpoint.  10. Hypersensitivity pneumonitis: Down and parakeet.  Resolved with prednisone  and avoidance of triggers.   SH: Lives at home with wife and 2 children and a grandchild. Never smoker, drinks 1 or 2 beers a week with dinner. 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   FH: Father with MI at 77, brother with atrial fibrillation, mother with renal failure.   Review of systems complete and found to be negative unless listed in HPI.    Current Outpatient Medications  Medication Sig Dispense Refill   atorvastatin  (LIPITOR) 80 MG tablet TAKE 1 TABLET BY MOUTH EVERY DAY 90 tablet 3   carvedilol  (COREG ) 25 MG tablet Take 1 tablet (25 mg total) by mouth 2 (two) times daily. 180 tablet 3   dofetilide  (TIKOSYN ) 250 MCG capsule TAKE 1 CAPSULE (250 MCG TOTAL) BY MOUTH 2 TIMES DAILY. PLEASE CALL FOR OFFICE VISIT [phone] 60 capsule 11   ENTRESTO  97-103 MG TAKE 1 TABLET BY MOUTH TWICE A DAY 180 tablet 3   eplerenone   (INSPRA ) 50 MG tablet TAKE 1 TABLET BY MOUTH EVERY DAY 90 tablet 1   FARXIGA  10 MG TABS tablet TAKE 1 TABLET BY MOUTH DAILY BEFORE BREAKFAST. 90 tablet 1   furosemide  (LASIX ) 40 MG tablet TAKE 1 TABLET BY MOUTH EVERY DAY 90 tablet 3   Multiple Vitamin (MULTIVITAMIN WITH MINERALS) TABS tablet Take 1 tablet by mouth in the morning. Centrum Multivitamin     rivaroxaban  (XARELTO ) 20 MG TABS tablet TAKE 1 TABLET BY MOUTH DAILY WITH SUPPER. 90 tablet 1   No current facility-administered medications for this encounter.   Allergies  Allergen Reactions   Other Other (See Comments)   Spironolactone      Gynecomastia   Sulfa Antibiotics     Thrush and hives   BP 116/80   Pulse 61   Wt 95.5 kg (210 lb 9.6 oz)   SpO2 98%   BMI 29.37 kg/m   Wt Readings from Last 3 Encounters:  03/20/23 95.5 kg (  210 lb 9.6 oz)  11/06/22 92.8 kg (204 lb 9.6 oz)  10/31/22 95.6 kg (210 lb 12.8 oz)    PHYSICAL EXAM: General:  NAD. No resp difficulty, walked into clinic HEENT: Normal Neck: Supple. No JVD. Cor: Regular rate & rhythm. No rubs, gallops or murmurs. Lungs: Clear Abdomen: Soft, nontender, nondistended.  Extremities: No cyanosis, clubbing, rash, edema Neuro: Alert & oriented x 3, moves all 4 extremities w/o difficulty. Affect pleasant.  ASSESSMENT & PLAN: 1. Chronic systolic HF: Nonischemic cardiomyopathy x years.  He has a [employer] ICD and is not a candidate for CRT due to narrow QRS.  7/16 LHC showed no significant CAD.  Echo 12/23 EF 35% with mildly decreased RV systolic function. NYHA class I. He is not volume overloaded by exam or [place].  - Continue Lasix  40 mg daily.  BMET today. - Continue dapagliflozin  10 mg daily.   No GU symptoms. - Continue Coreg  25 mg bid.  - Continue Entresto  97/103 bid.   - Continue eplerenone  50 mg daily (gynecomastia on spiro).  - Update echo next visit. 2.  Atrial fibrillation: Paroxysmal.  Holding NSR on Tikosyn .  QTc ok on today's ECG.  - Continue Xarelto  20 mg  daily. CBC today.  - Continue Tikosyn .  Check Mg today.  3. PVCs: Holter in 8/19 showed 8% PVCs.  This is unlikely to affect his cardiomyopathy.  Still with occasional PVCs, denies palpitations.  4. CKD: Stage 3.  Baseline SCr 1.3-1.5 - Continue dapagliflozin , BMET today.  5. Dilated ascending aorta: 4.2 cm on CT chest in 10/23. Echo 12/23 showed 43 mm aortic root and 41 mm ascending aorta which is slightly increased from prior evaluation  6. Hypersensitivity pneumonitis: Cough resolved after getting rid of down around his house and getting rid of parakeet.  7. CAD: Calcium  score 3129, 98th percentile.  Cardiac PET 1/24 showed mild ischemia in mid anterolateral wall with normal LAD stress flow, low risk from ischemia standpoint. He is asymptomatic with no chest pain or exertional dyspnea.  - Continue atorvastatin  80 mg daily, good lipids 2/24 (goal LDL < 55). Check lipids today.  Follow up in 4 months with Dr. Rolan + echo  Harlene HERO Eagar, [place]  03/20/2023

## 2023-03-29 ENCOUNTER — Ambulatory Visit: Payer: Medicare Other | Admitting: Internal Medicine

## 2023-04-02 ENCOUNTER — Other Ambulatory Visit: Payer: Self-pay | Admitting: Family Medicine

## 2023-04-02 DIAGNOSIS — R1010 Upper abdominal pain, unspecified: Secondary | ICD-10-CM | POA: Diagnosis not present

## 2023-04-02 DIAGNOSIS — R109 Unspecified abdominal pain: Secondary | ICD-10-CM

## 2023-04-03 ENCOUNTER — Ambulatory Visit
Admission: RE | Admit: 2023-04-03 | Discharge: 2023-04-03 | Disposition: A | Discharge status: Home / Self Care | Payer: Medicare Other | Source: Ambulatory Visit | Attending: Family Medicine | Admitting: Family Medicine

## 2023-04-03 DIAGNOSIS — R1032 Left lower quadrant pain: Secondary | ICD-10-CM | POA: Diagnosis not present

## 2023-04-03 DIAGNOSIS — Z87440 Personal history of urinary (tract) infections: Secondary | ICD-10-CM | POA: Diagnosis not present

## 2023-04-03 DIAGNOSIS — R109 Unspecified abdominal pain: Secondary | ICD-10-CM

## 2023-04-03 DIAGNOSIS — N390 Urinary tract infection, site not specified: Secondary | ICD-10-CM | POA: Diagnosis not present

## 2023-04-03 DIAGNOSIS — N289 Disorder of kidney and ureter, unspecified: Secondary | ICD-10-CM | POA: Diagnosis not present

## 2023-04-10 DIAGNOSIS — Z96611 Presence of right artificial shoulder joint: Secondary | ICD-10-CM | POA: Diagnosis not present

## 2023-04-10 DIAGNOSIS — M25512 Pain in left shoulder: Secondary | ICD-10-CM | POA: Diagnosis not present

## 2023-04-12 ENCOUNTER — Ambulatory Visit (HOSPITAL_BASED_OUTPATIENT_CLINIC_OR_DEPARTMENT_OTHER): Payer: Medicare Other | Admitting: Internal Medicine

## 2023-04-12 DIAGNOSIS — J672 Bird fancier's lung: Secondary | ICD-10-CM | POA: Diagnosis not present

## 2023-04-12 LAB — PULMONARY FUNCTION TEST
DL/VA % pred: 71 %
DL/VA: 2.92 ml/min/mmHg/L
DLCO cor % pred: 84 %
DLCO cor: 21.9 ml/min/mmHg
DLCO unc % pred: 79 %
DLCO unc: 20.63 ml/min/mmHg
FEF 25-75 Pre: 3.08 L/s
FEF2575-%Pred-Pre: 119 %
FEV1-%Pred-Pre: 117 %
FEV1-Pre: 3.89 L
FEV1FVC-%Pred-Pre: 101 %
FEV6-%Pred-Pre: 121 %
FEV6-Pre: 5.12 L
FEV6FVC-%Pred-Pre: 104 %
FVC-%Pred-Pre: 116 %
FVC-Pre: 5.18 L
Pre FEV1/FVC ratio: 75 %
Pre FEV6/FVC Ratio: 99 %

## 2023-04-12 NOTE — Progress Notes (Signed)
 Spirometry and DLCO Performed Today.

## 2023-04-12 NOTE — Patient Instructions (Signed)
 Spirometry and DLCO Performed Today.

## 2023-04-16 ENCOUNTER — Encounter: Payer: Self-pay | Admitting: Internal Medicine

## 2023-04-16 ENCOUNTER — Ambulatory Visit: Payer: Medicare Other | Admitting: Internal Medicine

## 2023-04-16 VITALS — BP 103/67 | HR 57 | Ht 69.5 in | Wt 210.0 lb

## 2023-04-16 DIAGNOSIS — J672 Bird fancier's lung: Secondary | ICD-10-CM

## 2023-04-16 DIAGNOSIS — Z8739 Personal history of other diseases of the musculoskeletal system and connective tissue: Secondary | ICD-10-CM

## 2023-04-16 NOTE — Progress Notes (Signed)
 IOV 03/26/2020  Subjective:  Patient ID: Terry [name], male , DOB: 08-02-1955 , age 68 y.o. , MRN: [id number] , ADDRESS: [address] [place] [address] PCP [name], MD Patient Care Team: [name], MD as PCP - General (Family Medicine) Quintella Reichert, MD as PCP - Cardiology (Cardiology)  This Provider for this visit: Treatment Team:  Attending Provider: Kalman Shan, MD    03/26/2020 -   Chief Complaint  Patient presents with   Consult     HPI Terry [name] 68 y.o. -non-smoker but with a long history of rheumatoid arthritis.  He is not on any immunomodulators for over 15 years.  He feels his quality of life is better with the immunomodulators.  He keeps himself physically active.  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  He had a CT scan of the chest for this that showed nonspecific nodules.  I personally visualized this and agree with this.  Some of these are scattered groundglass opacities some of them are tree-in-bud.  Particularly in the right lower lobe and also in the esophageal recess.  He denies any postnasal drip or acid reflux.  He denies any shortness of breath.  Of note he has cough that is chronic for the last 1 year.  He has been on Entresto for a few years.  A year ago Dr. Marca Ancona his cardiologist increase the [facility] and therefore his cough got worse.  His cough is rated at a scale of 3 out of 10 both early in the morning and late at night.  Early morning is particularly more.  Nevertheless overall the burden is mild.  There is no sputum production.  There is no shortness of breath or wheezing.    CT Chest data -January 28, 2020 personally visualized.  And agree with the findings.   IMPRESSION: 1. Ascending thoracic aorta approximately 4.2 cm maximal caliber. Recommend annual imaging followup by CTA or MRA. This recommendation follows 2010 ACCF/AHA/AATS/ACR/ASA/SCA/SCAI/SIR/STS/SVM Guidelines for the Diagnosis and  Management of Patients with Thoracic Aortic Disease. Circulation. 2010; 121: I951-O841. Aortic aneurysm NOS (ICD10-I71.9) 2. Scattered areas of tree-in-bud nodularity at the bases. More focal collection of nodules and ground-glass in the as ago esophageal recess. Findings are likely related to chronic infection. Largest nodule approximately 5 mm. Some of these areas show ground-glass features. Non-contrast chest CT at 3-6 months is recommended. If nodules persist and are stable at that time, consider additional non-contrast chest CT examinations at 2 and 4 years. This recommendation follows the consensus statement: Guidelines for Management of Incidental Pulmonary Nodules Detected on CT Images: From the Fleischner Society 2017; Radiology 2017; 284:228-243. 3. Cholelithiasis without evidence of acute cholecystitis. 4. Aortic atherosclerosis.   Aortic Atherosclerosis (ICD10-I70.0).     Electronically Signed   By: Donzetta Kohut M.D.  OV 09/21/2020  Subjective:  Patient ID: Terry [name], male , DOB: 10-Apr-1955 , age 68 y.o. , MRN: [id number] , ADDRESS: [address] [place] [address] PCP [name], MD Patient Care Team: [name], MD as PCP - General (Family Medicine) Quintella Reichert, MD as PCP - Cardiology (Cardiology)  This Provider for this visit: Treatment Team:  Attending Provider: Kalman Shan, MD    09/21/2020 -   Chief Complaint  Patient presents with   Follow-up    Pt states he has been doing okay since last visit and denies any concerns.   Follow-up mild chronic cough in the setting of  Entresto Follow-up history of rheumatoid arthritis not on immunomodulators Follow-up nonspecific CT scan infiltrates with multiple lung nodules -no shortness of breath  HPI Terry [name] 68 y.o. -returns for follow-up.  Since I last saw him in May 2022 he went to [place] and [place].  While in [place] mid May 2022 he developed mild COVID infection.  He recovered  from it.  He never had much symptoms.  He feels fine right now.  He just has mild cough because of Entresto.  No shortness of breath.  We went over the CT scan results showing waxing and waning tree-in-bud opacities particular in the lower lobe but overall increased.  Also slight increase in nodularity particularly 7 mm left lower lobe.  He is quite worried about this.  His wife indicated that he worries and is anxious quite a bit.  Explained to him in the setting of recent COVID this could be COVID-related accentuation of abnormalities and we could opt to follow-up in several months to see if things settle down.  He reflected on this.  After this we also discussed about the idea of having a bronchoscopy with lavage right now to rule out any MAI.  We went over the differential diagnosis of MAI, interstitial reticular abnormalities with potential to transform into ILD and rheumatoid nodules.  Based on this he wants to proceed with a bronchoscopy with lavage at this point to rule out any infection.  He is preparing to go to [place] in the end of September 2022.  Therefore he wants all his bases covered.   Risks of pneumothorax, hemothorax, sedation/anesthesia complications such as cardiac or respiratory arrest or hypotension, stroke and bleeding all explained. Benefits of diagnosis but limitations of non-diagnosis also explained. Patient verbalized understanding and wished to proceed.      CT Chest data July 2022    IMPRESSION: 1. Moderate patchy tree-in-bud opacities and scattered centrilobular nodules in the peripheral lungs bilaterally, most prominent at the lung bases, overall increased, although with some waxing and waning behavior. Minimal cylindrical bronchiolectasis at the lung bases. Findings are most compatible with a nonspecific chronic or recurrent infectious or inflammatory bronchiolitis, with the differential including atypical mycobacterial infection (MAI) or  recurrent aspiration. 2. The largest new pulmonary nodule measures 7 mm in the posterior left lower lobe, favor inflammatory. Suggest attention on follow-up chest CT in 6-12 months. 3. 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 Recommend annual imaging followup by CTA or MRA. This recommendation follows 2010 ACCF/AHA/AATS/ACR/ASA/SCA/SCAI/SIR/STS/SVM Guidelines for the Diagnosis and Management of Patients with Thoracic Aortic Disease. Circulation. 2010; 121: R604-V409. Aortic aneurysm NOS (ICD10-I71.9). 4. Three-vessel coronary atherosclerosis. 5. Cholelithiasis. 6. Aortic Atherosclerosis (ICD10-I70.0).     Electronically Signed   By: Delbert Phenix M.D.   On: 08/17/2020 11:40   Video visit Sept 2022  History of Present Illness: 68 year old male seen for pulmonary consult March 26, 2020 for abnormal CT chest with pulmonary nodules and chronic cough Medical history significant for rheumatoid arthritis, chronic systolic congestive heart failure due to nonischemic cardiomyopathy status post [facility] ICD June 2016.  (Echo 2006 EF 15 to 20%), A. Fib COVID infection May 2022 reported as mild symptoms  Today's video visit is a 6-week follow-up.  Patient is being followed for abnormal CT chest with scattered pulmonary nodules.  And chronic cough in the setting of Entresto.  Patient does have congestive heart failure/cardiomyopathy.  Recent CT chest done August 17, 2020 showed patchy tree-in-bud opacities and scattered nodules maximum at  7 mm.  And some minimal bronchiectasis.  Patient was set up for bronchoscopy with lavage.  This was done on October 05, 2020 airway exam showed minimum clear secretions, normal mucosa and no visible endobronchial lesions.  BAL culture was negative with normal respiratory flora, AFB culture was negative, pneumocystis jiroveci Ag negative, fungal culture negative, cytology identified no malignant cells, benign bronchial cells and pulmonary  macrophages. Patient says overall his breathing is doing okay.  He remains very active.  O2 saturations at home remain around 98% on room air.  Patient swims at least 45 minutes each day.  Denies any significant cough. We reviewed his culture reports.   OV 12/06/2020  Subjective:  Patient ID: Terry [name], male , DOB: 26-Apr-1955 , age 31 y.o. , MRN: [id number] , ADDRESS: [address] [place] [address] PCP [name], MD Patient Care Team: [name], MD as PCP - General (Family Medicine) Quintella Reichert, MD as PCP - Cardiology (Cardiology)  This Provider for this visit: Treatment Team:  Attending Provider: Kalman Shan, MD    12/06/2020 -   Chief Complaint  Patient presents with   Follow-up    No c/o, follow up on Super D CT    HPI returns for follow Terry [name] 68 y.o. -returns for follow-up.  Presents with his wife.  He just came back from a trip to [place] in [place].  He was walking 5 to 6 miles a day.  Felt really good.  He had bronchoscopy with lavage in August 2022 before this trip and the cultures are negative.  This was reviewed by nurse practitioner.  He now had a follow-up CT scan of the chest that shows significant improvement.  This is reviewed below.  He has no new complaints.  He is up-to-date with his respiratory vaccines.    CT Chest data 12/01/20  IMPRESSION: Decreased size of the LEFT lower lobe pulmonary nodule now 2 mm compatible with a resolving infectious or inflammatory changes.   Patchy tree-in-bud nodularity and subtle ground-glass seen on the previous exam also improved and compatible with improving appearance of sequela of chronic infection.   Three-vessel coronary artery disease.   Mild aneurysmal dilation of the ascending thoracic aorta 4.1 cm is similar to the prior study. Recommend annual imaging followup by CTA or MRA. This recommendation follows 2010 ACCF/AHA/AATS/ACR/ASA/SCA/SCAI/SIR/STS/SVM Guidelines for  the Diagnosis and Management of Patients with Thoracic Aortic Disease. Circulation. 2010; 121: N829-F621. Aortic aneurysm NOS (ICD10-I71.9)   Cholelithiasis.   Aortic Atherosclerosis (ICD10-I70.0).     Electronically Signed   By: Donzetta Kohut M.D.   On: 12/02/2020 08:45  OV 12/06/2021  Subjective:  Patient ID: Terry [name], male , DOB: November 16, 1955 , age 41 y.o. , MRN: [id number] , ADDRESS: [address] [place] [address] PCP [name], MD Patient Care Team: [name], MD as PCP - General (Family Medicine) Quintella Reichert, MD as PCP - Cardiology (Cardiology)  This Provider for this visit: Treatment Team:  Attending Provider: Kalman Shan, MD   12/06/2021 -   Chief Complaint  Patient presents with   Follow-up    Follow-up visit PT states no problems with breathing     HPI Terry [name] 68 y.o. - presents with wife. ROV is a year to date. He is doing well. Minimal symptoms. Had sinus infection a month ago and Rx with doxy. In May 2023 went to [place] and [place]. NExt may 2024 plans to go to [place].Overall  well  other than mild cough early in the moerning attributed to entrerstoe. He does stationary boke an has no dyspnea.  However, on exam there was rLL crackles and on CT - Dr Llana Aliment feels nodularity is worse than last year and features fit in with HP. I also think there is aRLL intestritual infiltrate that is new since last year. So, asking about exposures- he has had 2parakeets x 20 years in kitchen. Wife cleans them but he sits near them in the mornings. Occasinally he cleans them. He also has some down jackets which he likes veyr much  BAL last year showed 25% lymphocytes     Narrative & Impression  CLINICAL DATA:  68 year old male with history of pulmonary nodules. Follow-up study.   EXAM: CT CHEST WITHOUT CONTRAST   TECHNIQUE: Multidetector CT imaging of the chest was performed following the standard protocol without  IV contrast.   RADIATION DOSE REDUCTION: This exam was performed according to the departmental dose-optimization program which includes automated exposure control, adjustment of the mA and/or kV according to patient size and/or use of iterative reconstruction technique.   COMPARISON:  Chest CT 12/01/2020.   FINDINGS: Cardiovascular: Heart size is normal. There is no significant pericardial fluid, thickening or pericardial calcification. There is aortic atherosclerosis, as well as atherosclerosis of the great vessels of the mediastinum and the coronary arteries, including calcified atherosclerotic plaque in the left main, left anterior descending, left circumflex and right coronary arteries. Left-sided pacemaker/AICD with lead tip terminating in the right ventricular apex.   Mediastinum/Nodes: No pathologically enlarged mediastinal or hilar lymph nodes. Please note that accurate exclusion of hilar adenopathy is limited on noncontrast CT scans. Esophagus is unremarkable in appearance. No axillary lymphadenopathy.   Lungs/Pleura: Diffuse bronchial wall thickening, mild thickening of the peribronchovascular interstitium and widespread centrilobular ground-glass attenuation micro nodularity scattered throughout the lungs bilaterally, overall, increased compared to the prior examination. No other definite larger more suspicious appearing pulmonary nodules or masses are noted. No confluent consolidative airspace disease. No pleural effusions.   Upper Abdomen: Numerous calcified gallstones in the gallbladder measuring up to 1.7 cm in diameter. Gallbladder is nearly completely collapsed in otherwise unremarkable in appearance. Atherosclerosis in the abdominal aorta.   Musculoskeletal: There are no aggressive appearing lytic or blastic lesions noted in the visualized portions of the skeleton.   IMPRESSION: 1. 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 Alternatively, mild atypical infection could be considered in the appropriate clinical setting. Further clinical evaluation is recommended. 2. Aortic atherosclerosis, in addition to left main and three-vessel coronary artery disease. Please note that although the presence of coronary artery calcium documents the presence of coronary artery disease, the severity of this disease and any potential stenosis cannot be assessed on this non-gated CT examination. Assessment for potential risk factor modification, dietary therapy or pharmacologic therapy may be warranted, if clinically indicated. 3. Cholelithiasis.   Aortic Atherosclerosis (ICD10-I70.0).     Electronically Signed   By: Trudie Reed M.D.   On: 12/02/2021 07:59   NOv 2023 OV with APP   History of Present Illness: 68 year old male followed for abnormal CT chest with pulmonary nodularity and chronic cough Medical history significant for rheumatoid arthritis, chronic systolic congestive heart failure due to nonischemic cardiomyopathy status post [facility] ICD June 2016 Echo 2006 EF 15 to 20%), A. Fib   Today's video visit is a 1 month follow-up.  Patient was seen last visit for a an  exacerbation of hypersensitivity pneumonitis. Presented with increased cough , sinus drainage , and dyspnea.  Patient history positive for long-term exposure to down coats and parakeets in his home.  Patient was recommended to remove all birds from his home.  And was started on a slow prednisone taper.  He is currently on prednisone 5mg  daily. Has 10 days left of taper.  Lab work showed negative QuantiFERON gold, allergy panel was positive for dust, trees, IgE 86. Elevated absolute eosinophils at 900 .  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   He has gotten rid of all down clothing and pillows  and all birds are gone. Cleaned the house he is feeling much better with resolution of cough . Dyspnea is gone. Going to gym twice a week without any issues. Was able to yard work.    OV 02/21/2022  Subjective:  Patient ID: Terry [name], male , DOB: 1955/07/03 , age 68 y.o. , MRN: [id number] , ADDRESS: [address] [place] [address] PCP [name], MD Patient Care Team: [name], MD as PCP - General (Family Medicine) Quintella Reichert, MD as PCP - Cardiology (Cardiology)  This Provider for this visit: Treatment Team:  Attending Provider: Kalman Shan, MD    02/21/2022 -   Chief Complaint  Patient presents with   Follow-up    Doing well,  Did get birds out of home.  PFT today review    HPI Terry [name] 68 y.o. -returns for follow-up.  Since his last visit with me we presented him at the multidisciplinary case conference in November 2023.  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  Question was aspiration.  I did talk to him.  He said in the past when he used to eat fast used to choke on food but he has not had this problem for a while.  Because he eats and chews his food slowly.  In the interim he is gotten rid of all organic antigen exposure and this actually helped him.  He does not have any residual cough.  He is not short of breath he is doing swimming and yard work.  He finished the prednisone taper I gave him that this also actually helped him.  He is not having any sinus drainage or wheezing.  He did do the ILD questionnaire and it is as below.  [facility] Comprehensive ILD Questionnaire  Symptoms:    Past Medical History :  -As below   ROS:  Arthralgia  FAMILY HISTORY of LUNG DISEASE:  No family history of lung disease  PERSONAL EXPOSURE HISTORY:  -His parents smoked when he was a child and there was  passive smoking.  Currently lives [place] single-family home in the [place] setting for the last 22 years.  He did have pet bird and he got rid of it after the last visit.  He did get rid of feather pillow/Dubay.  He does do some gardening.  He is retired currently.  He did have some down jackets  -No smoking marijuana or cocaine. HOME  EXPOSURE and HOBBY DETAILS :  x  OCCUPATIONAL HISTORY (122 questions) : -Detail organic and inorganic antigen history is negative  PULMONARY TOXICITY HISTORY (27 items):  BAL. lymphocytosis in the past  INVESTIGATIONS: Multidisciplinary case conference [phone] 2023 notes:  According to Dr. Dorothey Baseman radiologist CT most recent - lot of micronodules. More pronoucned in Lower lungs but also diffuse across both lungs . tree in bud +. No HC. No TB. No AT. No mosaic attenuation.  Currently worse since 2021. Ddx is ASpiration, Follicular bronchiolitis. DR Dorothey Baseman does not think is HP. It is not ILD protocol CT Per Dr Dorothey Baseman  get ILD protocol to look for air trapping. Per Dr Francine Graven ask for aspiration. Cliniccally this is HP but radiologically not sure. Short course steroids ok   OV 09/14/2022  Subjective:  Patient ID: Terry [name], male , DOB: 1955/02/18 , age 31 y.o. , MRN: [id number] , ADDRESS: [address] [place] [address] PCP Noberto Retort, MD Patient Care Team: Noberto Retort, MD as PCP - General (Family Medicine) Quintella Reichert, MD as PCP - Cardiology (Cardiology) Laurey Morale, MD as PCP - Advanced Heart Failure (Cardiology)  This Provider for this visit: Treatment Team:  Attending Provider: Kalman Shan, MD    09/14/2022 -   Chief Complaint  Patient presents with   Follow-up    F/up, no complaints.     HPI Terry [name] 68 y.o. -returns for follow-up.  Presents with his wife.  He continues to be asymptomatic.  He had pulmonary function test and this continues to be stable.  He has gotten rid of all his down jackets.  He  recently completed a trip to [place] in [place] in May 2024.  He tolerated this trip really well.  Yesterday's heart failure clinic Dr. [name].  I reviewed those notes.  He is stable.  There are no other new issues.  We are going to do 1 year follow-up CT scan in October/November 2024 but we will postpone this given his continued stability.  Additionally the cardiac PET CT scan of the chest in January 2024 did not show any worrisome pulmonary lesions.   CT jan 2024  Lungs/Pleura: No acute pulmonary findings or worrisome pulmonary lesions or nodules. Mild dependent atelectasis/edema but no pleural effusions.   Upper Abdomen: No significant upper abdominal findings. Cholelithiasis again noted along with vascular calcifications.   Musculoskeletal: No significant bony findings.   IMPRESSION: 1. No acute pulmonary findings or worrisome pulmonary lesions or nodules. 2. No mediastinal or hilar mass or adenopathy. 3. Extensive coronary artery calcifications. 4. Cholelithiasis.     Electronically Signed   By: Rudie Meyer M.D.   On: 02/14/2022 11:18   OV 04/16/2023  Subjective:  Patient ID: Terry [name], male , DOB: [birth year] , age 8 y.o. , MRN: [id number] , ADDRESS: [address] [place] [address] PCP Noberto Retort, MD Patient Care Team: Noberto Retort, MD as PCP - General (Family Medicine) Quintella Reichert, MD as PCP - Cardiology (Cardiology) Laurey Morale, MD as PCP - Advanced Heart Failure (Cardiology)  This Provider for this visit: Treatment Team:  Attending Provider: Kalman Shan, MD   Follow-up mild chronic cough in the setting of Entresto - chronis c-CHF - seesm McLEan Follow-up history of rheumatoid arthritis not on immunomodulators Follow-up nonspecific CT scan infiltrates with multiple lung nodules -no shortness of breath - July 2022  - bronch bal - neg aug 2022. BAL lymphocyte 25%  - CT improved - oct 2022  -High-resolution CT chest October  2023 raise the specter of hypersensitive pneumonitis Follow-up RAST allergy panel + October 2023 Mild covi mid may 2022 in [place] Travel coming up to [place] May 2024  04/16/2023 -  Chief Complaint  Patient presents with   Follow-up     HPI Terry [name] 68 y.o. -returns for follow-up.  Seen in the summer 2024.  He continues to do well.  He had pulmonary function test and that is essentially normal and it is definitely improved over time.  A year ago he did have cardiac chest imaging in the lung fields were clear.  In the interim  Interim Health status: No new complaints No new medical problems. No new surgeries. No ER visits. No Urgent care visits. No changes to medications.  He did not want any medications for his travel to [place].  He is not interested in CT scan of the chest.      SYMPTOM SCALE - ILD 02/21/2022 09/14/2022  04/16/2023   Current weight     O2 use ra ra ra  Shortness of Breath 0 -> 5 scale with 5 being worst (score 6 If unable to do)    At rest 00 0 0  Simple tasks - showers, clothes change, eating, shaving 0 0 0  Household (dishes, doing bed, laundry) 00 0 0  Shopping 0 0 0  Walking level at own pace 0 0 0  Walking up Stairs 0 0 1  Total (30-36) Dyspnea Score 0 0 1      Non-dyspnea symptoms (0-> 5 scale) 02/21/2022 09/14/2022  04/16/2023   How bad is your cough? 00 0 0  How bad is your fatigue 0 00 0  How bad is nausea 0 0 0  How bad is vomiting?  0  0  How bad is diarrhea? 0 0 0  How bad is anxiety? 00 0 0  How bad is depression 0 0 0  Any chronic pain - if so where and how bad 0 0 0     Simple office walk 224 (66+46 x 2) feet Pod A at [facility] x  3 laps goal with forehead probe 04/16/2023    O2 used ra   Number laps completed Sit and stnd  x 15   Comments about pace x   Resting Pulse Ox/HR 99% and 59/min   Final Pulse Ox/HR 97% and 72/min   Desaturated </= 88% no   Desaturated <= 3% points no   Got Tachycardic >/= 90/min no   Symptoms at end of test  none   Miscellaneous comments x     PFT     Latest Ref Rng & Units 04/12/2023    8:36 AM 09/07/2022    3:17 PM 02/21/2022    2:22 PM  PFT Results  FVC-Pre L 5.18  P 5.16    FVC-Predicted Pre % 116  P 109  97   Pre FEV1/FVC % % 75  P 73  76   FEV1-Pre L 3.89  P 3.79  3.52   FEV1-Predicted Pre % 117  P 108  99   DLCO uncorrected ml/min/mmHg 20.63  P 20.70  20.09   DLCO UNC% % 79  P 75  73   DLCO corrected ml/min/mmHg 21.90  P 20.70  21.79   DLCO COR %Predicted % 84  P 75  79   DLVA Predicted % 71  P 68  73     P Preliminary result       LAB RESULTS last 96 hours No results found.       has a past medical history of Agatston coronary artery calcium score greater than 400, AICD (automatic cardioverter/defibrillator) present (08/05/2014), Anemia, Aortic  atherosclerosis (HCC), Aortic insufficiency, Ascending aorta dilation (HCC), Chronic systolic CHF (congestive heart failure), NYHA class 1 (HCC), Complication of anesthesia, GERD (gastroesophageal reflux disease), H/O hematuria, H/O hiatal hernia, Hepatitis (1960's), History of blood transfusion (2013; 2014), Hypertension, Nonischemic dilated cardiomyopathy (HCC), PAF (paroxysmal atrial fibrillation) (HCC), PVC (premature ventricular contraction), and Rheumatoid arthritis (HCC) (dx'd 1995).   reports that he has never smoked. He has never used smokeless tobacco.  Past Surgical History:  Procedure Laterality Date   BRONCHIAL WASHINGS  10/05/2020   Procedure: BRONCHIAL WASHINGS;  Surgeon: Kalman Shan, MD;  [facility]: [facility];  Service: Endoscopy;;   CARDIAC CATHETERIZATION  ~ 2004   normal   CARDIAC CATHETERIZATION N/A 09/07/2014   Procedure: Left Heart Cath and Coronary Angiography;  Surgeon: Laurey Morale, MD;  Location: [facility] INVASIVE CV LAB;  Service: Cardiovascular;  Laterality: N/A;   CARDIAC DEFIBRILLATOR PLACEMENT  08/05/2014   St. Jude   CARDIOVERSION N/A 08/21/2013   Procedure: CARDIOVERSION;  Surgeon: Quintella Reichert, MD;  Location: [facility];  Service: Cardiovascular;  Laterality: N/A;   COLONOSCOPY  2010; 2015   "polyps; no polyps"   ELBOW SURGERY Left 2021   Dr. 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    JOINT REPLACEMENT     NASAL SEPTUM SURGERY  ~ 1975   REVERSE SHOULDER ARTHROPLASTY Right 03/24/2022   Procedure: REVERSE SHOULDER ARTHROPLASTY;  Surgeon: Yolonda Kida, MD;  Location: [facility];  Service: Orthopedics;  Laterality: Right;  120   TONSILLECTOMY     TOTAL HIP ARTHROPLASTY  09/08/2011   Procedure: TOTAL HIP ARTHROPLASTY ANTERIOR APPROACH;  Surgeon: [name], MD;  Location: [facility];  Service: Orthopedics;  Laterality: Right;  Right total hip replacement, left knee steroid injection   TOTAL KNEE ARTHROPLASTY Left 11/29/2012   Procedure: LEFT TOTAL KNEE ARTHROPLASTY;  Surgeon: [name], MD;  [facility]: [facility];  Service: Orthopedics;  Laterality: Left;  FEMORAL NERVE BLOCK IN HOLDING AREA LEFT LEG   TOTAL KNEE ARTHROPLASTY Right 11/30/2017   Procedure: RIGHT TOTAL KNEE ARTHROPLASTY;  Surgeon: [name], MD;  [facility];  Service: Orthopedics;  Laterality: Right;   URETHRAL FISTULA REPAIR  ~ 1996   URETHRAL STRICTURE DILATATION  "several times"   VIDEO BRONCHOSCOPY N/A 10/05/2020   Procedure: VIDEO BRONCHOSCOPY WITHOUT FLUORO;  Surgeon: Kalman Shan, MD;  [facility];  Service: Endoscopy;  Laterality: N/A;    Allergies  Allergen Reactions   Other Other (See Comments)   Spironolactone     Gynecomastia   Sulfa Antibiotics     Thrush and hives    Immunization History  Administered Date(s) Administered   Fluad Quad(high Dose 65+) 10/10/2021   Influenza Split 11/30/2008, 10/26/2011, 10/18/2013, 08/06/2014, 10/11/2014,  10/22/2015, 09/30/2016, 09/28/2018, 09/28/2019   Influenza,inj,Quad PF,6+ Mos 09/30/2016, 10/06/2017, 09/28/2018   Influenza-Unspecified 10/17/2020   Moderna Covid-19 Vaccine Bivalent Booster 28yrs & up 12/15/2021   PFIZER Comirnaty(Gray Top)Covid-19 Tri-Sucrose Vaccine 05/12/2020   PFIZER(Purple Top)SARS-COV-2 Vaccination 04/30/2019, 05/21/2019, 11/23/2019   Pfizer Covid-19 Vaccine Bivalent Booster 40yrs & up 10/24/2020, 06/14/2021   Pfizer(Comirnaty)Fall Seasonal Vaccine 12 years and older 12/15/2021   Pneumococcal Polysaccharide-23 08/06/2014   Respiratory Syncytial Virus Vaccine,Recomb Aduvanted(Arexvy) 10/15/2021   Td 12/20/2016   Tdap 11/26/2006   Zoster Recombinant(Shingrix) 10/26/2018,  12/28/2018   Zoster, Live 12/14/2015, 10/26/2018, 12/28/2018    Family History  Problem Relation Age of Onset   Heart disease Father    Heart attack Father 11   Heart failure Brother      Current Outpatient Medications:    atorvastatin (LIPITOR) 80 MG tablet, TAKE 1 TABLET BY MOUTH EVERY DAY, Disp: 90 tablet, Rfl: 3   carvedilol (COREG) 25 MG tablet, Take 1 tablet (25 mg total) by mouth 2 (two) times daily., Disp: 180 tablet, Rfl: 3   dofetilide (TIKOSYN) 250 MCG capsule, TAKE 1 CAPSULE (250 MCG TOTAL) BY MOUTH 2 TIMES DAILY. PLEASE CALL FOR OFFICE VISIT [phone], Disp: 60 capsule, Rfl: 11   ENTRESTO 97-103 MG, TAKE 1 TABLET BY MOUTH TWICE A DAY, Disp: 180 tablet, Rfl: 3   eplerenone (INSPRA) 50 MG tablet, TAKE 1 TABLET BY MOUTH EVERY DAY, Disp: 90 tablet, Rfl: 1   FARXIGA 10 MG TABS tablet, TAKE 1 TABLET BY MOUTH DAILY BEFORE BREAKFAST., Disp: 90 tablet, Rfl: 1   furosemide (LASIX) 40 MG tablet, TAKE 1 TABLET BY MOUTH EVERY DAY, Disp: 90 tablet, Rfl: 3   Multiple Vitamin (MULTIVITAMIN WITH MINERALS) TABS tablet, Take 1 tablet by mouth in the morning. Centrum Multivitamin, Disp: , Rfl:    rivaroxaban (XARELTO) 20 MG TABS tablet, TAKE 1 TABLET BY MOUTH DAILY WITH SUPPER., Disp: 90 tablet,  Rfl: 1      Objective:   Vitals:   04/16/23 1548  BP: 103/67  Pulse: (!) 57  SpO2: 97%  Weight: 210 lb (95.3 kg)  Height: 5' 9.5" (1.765 m)    Estimated body mass index is 30.57 kg/m as calculated from the following:   Height as of this encounter: 5' 9.5" (1.765 m).   Weight as of this encounter: 210 lb (95.3 kg).  @WEIGHTCHANGE @  [employer]   04/16/23 1548  Weight: 210 lb (95.3 kg)     Physical Exam   General: No distress. Looks well O2 at rest: no Cane present: no Sitting in wheel chair: no Frail: no Obese: no Neuro: Alert and Oriented x 3. GCS 15. Speech normal Psych: Pleasant Resp:  Barrel Chest - no.  Wheeze - no, Crackles - no, No overt respiratory distress CVS: Normal heart sounds. Murmurs - no Ext: Stigmata of Connective Tissue Disease - no HEENT: Normal upper airway. PEERL +. No post nasal drip        Assessment:       ICD-10-CM   1. 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 History of rheumatoid arthritis  Z87.39 Pulmonary function test         Plan:     Patient Instructions     ICD-10-CM   1. 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 Multiple lung nodules on CT  R91.8     3. ILD (interstitial lung disease) (HCC)  J84.9     4. Long-term exposure involving bird droppings  Z77.29     5. History of rheumatoid arthritis  Z87.39       Glad you are feeling normal after completely getting rid of down jackets and also birds from the indoor environment and after completing steroid course.  Current lung function is essentially normal and actually improved  Physical exam is normal.  He had a cardiac CT scan of the chest in January 2024 and in this the lung tissue was described as being clear   Plan -  -Best approach  is serial monitoring  -Do spirometry and DLCO in 12 months - ENjoy [place] in May 2025  -Follow-up -12  months after completing  PFT; 15 min visit  -ILD symptom at  follow-up   FOLLOWUP Return in about 1 year (around 04/15/2024) for 15 min visit, after Cleda Daub and DLCO, with Dr Marchelle Gearing, Face to Face Visit.    SIGNATURE    Dr. Kalman Shan, M.D., F.C.C.P,  Pulmonary and Critical Care Medicine Staff Physician, [facility] Center Director - Interstitial Lung Disease  Program  Pulmonary Fibrosis [facility] at [facility] [place], [place], [address]  Pager: [phone], If no answer or between  15:00h - 7:00h: call [phone] Telephone: [phone]  4:25 PM 04/16/2023

## 2023-04-16 NOTE — Patient Instructions (Addendum)
 ICD-10-CM   1. 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 Multiple lung nodules on CT  R91.8     3. ILD (interstitial lung disease) (HCC)  J84.9     4. Long-term exposure involving bird droppings  Z77.29     5. History of rheumatoid arthritis  Z87.39       Glad you are feeling normal after completely getting rid of down jackets and also birds from the indoor environment and after completing steroid course.  Current lung function is essentially normal and actually improved  Physical exam is normal.  He had a cardiac CT scan of the chest in January 2024 and in this the lung tissue was described as being clear   Plan -  -Best approach is serial monitoring  -Do spirometry and DLCO in 12 months - [facility]joy [place] in May 2025  -Follow-up -12  months after completing  PFT; 15 min visit  -ILD symptom at follow-up

## 2023-04-17 NOTE — Progress Notes (Signed)
 Remote ICD transmission.

## 2023-05-02 ENCOUNTER — Encounter: Payer: Self-pay | Admitting: Cardiology

## 2023-05-03 ENCOUNTER — Telehealth: Payer: Self-pay

## 2023-05-03 NOTE — Telephone Encounter (Signed)
Phone note created in error. 

## 2023-05-08 NOTE — Progress Notes (Unsigned)
 Date:  05/09/2023   ID:  Terry [name], DOB Jun 22, 1955, MRN [id number] The patient was identified using 2 identifiers.  PCP:  Noberto Retort, MD   [facility] Providers Cardiologist:  Armanda Magic, MD Advanced Heart Failure:  Marca Ancona, MD     Evaluation Performed:  Follow-Up Visit  Chief Complaint:  HF, HTN, PAF, DCM   History of Present Illness:    Terry [name] is a 68 y.o. 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 Back in 2006, an echo showed EF 15-20%.  LHC at that time showed normal coronaries.  He has been managed for cardiomyopathy since that time.   In 12/14, EF had improved to 50%.  However, he then had a decline with echo in 5/16 showing EF 25%.  Atrial fibrillation was first noted in 2013.  In 7/15, he had DCCV to NSR.  In 1/16, he was noted to be back in atrial fibrillation. He had St Jude ICD placed by Dr [name] in 6/16.  Given fall in EF, Lexiscan Cardiolite was done. This showed EF 39% with partially reversible inferior and apical perfusion defect. He had LHC in 7/16 showing no significant CAD.  In 8/16, he was admitted for Tikosyn initiation and converted to NSR.  Echo in 8/18 showed EF 35-40%, mild to moderately decreased RV systolic function.     Echo in 8/19 showed EF 40-45%, mild-moderate MR, mild to moderately decreased RV systolic function, mild to moderate MR and dilated aortic root at 43mm and ascending aorta 42mm.  Holter showed 8% PVCs by tele monitor.  He is followed by EP for his ICD. Workup for DCM included HIV (neg), ferritin (normal) and no monoclonal Ab on SPEP/UPEP.   He is followed as well by AHF service.    He has a hx of RA and also pulmonary nodules followed by Pulmonary.  HRCT showed hypersensitive pneumonitis.  He is here today for followup and is doing well.  He denies any chest pain or pressure, SOB, DOE, PND, orthopnea, LE edema, dizziness,  palpitations or syncope. He is compliant with his meds and is tolerating meds with no SE.  He has been working out in the yard mowing with no problems.  He walks and works out at [employer] for 45 minutes for exercise and also swims with no problems.   Past Medical History:  Diagnosis Date   Agatston coronary artery calcium score greater than 400    Cor Ca score 3129 on 11/2021   AICD (automatic cardioverter/defibrillator) present 08/05/2014   St Jude Ellipse VR   Anemia    "S/P knee & hip OR"   Aortic atherosclerosis (HCC)    Aortic insufficiency    mild    Ascending aorta dilation (HCC)    42mm by echo and CT 01/2020 and CT 2023   Chronic systolic CHF (congestive heart failure), NYHA class 1 (HCC)    Complication of anesthesia    hx of irregular beat after anesthesia in ~ 1990's   GERD (gastroesophageal reflux disease)    H/O hematuria    H/O hiatal hernia    Hepatitis 1960's   "caught it from my brother"   History of blood transfusion 2013; 2014   S/P knee and hip OR   Hypertension    Nonischemic dilated cardiomyopathy (HCC)    EF 40-45% by echo 2019.  S/P AICD for primary prevention.  Repeat EF 01/2020 35-40%.    PAF (paroxysmal atrial fibrillation) (HCC)    "off and on since 2013" (08/05/2014)   PVC (premature ventricular contraction)    PVC load 8%   Rheumatoid arthritis (HCC) dx'd 1995   Past Surgical History:  Procedure Laterality Date   BRONCHIAL WASHINGS  10/05/2020   Procedure: BRONCHIAL WASHINGS;  Surgeon: Kalman Shan, MD;  [facility];  Service: Endoscopy;;   CARDIAC CATHETERIZATION  ~ 2004   normal   CARDIAC CATHETERIZATION N/A 09/07/2014   Procedure: Left Heart Cath and Coronary Angiography;  Surgeon: Laurey Morale, MD;  Location: [facility] INVASIVE CV LAB;  Service: Cardiovascular;  Laterality: N/A;   CARDIAC DEFIBRILLATOR PLACEMENT  08/05/2014   St. Jude   CARDIOVERSION N/A 08/21/2013   Procedure: CARDIOVERSION;  Surgeon: Quintella Reichert, MD;   Location: [facility];  Service: Cardiovascular;  Laterality: N/A;   COLONOSCOPY  2010; 2015   "polyps; no polyps"   ELBOW SURGERY Left 2021   Dr. 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    JOINT REPLACEMENT     NASAL SEPTUM SURGERY  ~ 1975   REVERSE SHOULDER ARTHROPLASTY Right 03/24/2022   Procedure: REVERSE SHOULDER ARTHROPLASTY;  Surgeon: Yolonda Kida, MD;  Location: [facility];  Service: Orthopedics;  Laterality: Right;  120   TONSILLECTOMY     TOTAL HIP ARTHROPLASTY  09/08/2011   Procedure: TOTAL HIP ARTHROPLASTY ANTERIOR APPROACH;  Surgeon: [name], MD;  Location: [facility];  Service: Orthopedics;  Laterality: Right;  Right total hip replacement, left knee steroid injection   TOTAL KNEE ARTHROPLASTY Left 11/29/2012   Procedure: LEFT TOTAL KNEE ARTHROPLASTY;  Surgeon: [name], MD;  Location: [facility];  Service: Orthopedics;  Laterality: Left;  FEMORAL NERVE BLOCK IN HOLDING AREA LEFT LEG   TOTAL KNEE ARTHROPLASTY Right 11/30/2017   Procedure: RIGHT TOTAL KNEE ARTHROPLASTY;  Surgeon: [name], MD;  [facility]: [facility];  Service: Orthopedics;  Laterality: Right;   URETHRAL FISTULA REPAIR  ~ 1996   URETHRAL STRICTURE DILATATION  "several times"   VIDEO BRONCHOSCOPY N/A 10/05/2020   Procedure: VIDEO BRONCHOSCOPY WITHOUT FLUORO;  Surgeon: Kalman Shan, MD;  [facility];  Service: Endoscopy;  Laterality: N/A;     Current Meds  Medication Sig   atorvastatin (LIPITOR) 80 MG tablet TAKE 1 TABLET BY MOUTH EVERY DAY   carvedilol (COREG) 25 MG tablet Take 1 tablet (25 mg total) by mouth 2 (two) times daily.   dofetilide (TIKOSYN) 250 MCG capsule TAKE 1 CAPSULE (250 MCG TOTAL) BY MOUTH 2 TIMES DAILY. PLEASE CALL FOR OFFICE VISIT [phone]    ENTRESTO 97-103 MG TAKE 1 TABLET BY MOUTH TWICE A DAY   eplerenone (INSPRA) 50 MG tablet TAKE 1 TABLET BY MOUTH EVERY DAY   FARXIGA 10 MG TABS tablet TAKE 1 TABLET BY MOUTH DAILY BEFORE BREAKFAST.   furosemide (LASIX) 40 MG tablet TAKE 1 TABLET BY MOUTH EVERY DAY   Multiple Vitamin (MULTIVITAMIN WITH MINERALS) TABS tablet Take 1 tablet by mouth in the morning. Centrum Multivitamin   rivaroxaban (XARELTO) 20 MG TABS tablet TAKE 1 TABLET BY MOUTH DAILY WITH SUPPER.     Allergies:   Other, Spironolactone, and Sulfa antibiotics   Social History   Tobacco Use  Smoking status: Never   Smokeless tobacco: Never  Vaping Use   Vaping status: Never Used  Substance Use Topics   Alcohol use: Yes    Alcohol/week: 1.0 standard drink of alcohol    Types: 1 Cans of beer per week    Comment: rare   Drug use: No     Family Hx: The patient's family history includes Heart attack (age of onset: 55) in his father; Heart disease in his father; Heart failure in his brother.  ROS:   Please see the history of present illness.     All other systems reviewed and are negative.   Prior CV studies:   The following studies were reviewed today:  none  Labs/Other Tests and Data Reviewed:     Recent Labs: 09/13/2022: B Natriuretic Peptide 81.1 03/20/2023: BUN 26; Creatinine, Ser 1.37; Hemoglobin 12.7; Magnesium 2.3; Platelets 235; Potassium 4.2; Sodium 137   Recent Lipid Panel Lab Results  Component Value Date/Time   CHOL 89 03/20/2023 09:30 AM   CHOL 123 12/13/2021 08:19 AM   TRIG 101 03/20/2023 09:30 AM   HDL 27 (L) 03/20/2023 09:30 AM   HDL 42 12/13/2021 08:19 AM   CHOLHDL 3.3 03/20/2023 09:30 AM   LDLCALC 42 03/20/2023 09:30 AM   LDLCALC 64 12/13/2021 08:19 AM    Wt Readings from Last 3 Encounters:  05/09/23 211 lb 3.2 oz (95.8 kg)  04/16/23 210 lb (95.3 kg)  04/12/23 211 lb (95.7 kg)        Objective:    Vital Signs:  BP 102/64   Pulse 62   Ht 5\' 11"  (1.803 m)   Wt 211 lb 3.2 oz  (95.8 kg)   SpO2 95%   BMI 29.46 kg/m   GEN: Well nourished, well developed in no acute distress HEENT: Normal NECK: No JVD; No carotid bruits LYMPHATICS: No lymphadenopathy CARDIAC:RRR, no murmurs, rubs, gallops RESPIRATORY:  Clear to auscultation without rales, wheezing or rhonchi  ABDOMEN: Soft, non-tender, non-distended MUSCULOSKELETAL:  trace edema; No deformity  SKIN: Warm and dry NEUROLOGIC:  Alert and oriented x 3 PSYCHIATRIC:  Normal affect  ASSESSMENT & PLAN:    1.  Chronic systolic CHF -  -He has a St Jude ICD and is not a candidate for CRT due to narrow QRS.  7/16 LHC showed no significant CAD.  Echo 12/23 EF 35% with mildly decreased RV systolic function.  He has stable NHYA Class I symptoms.   -He remains active working out in his yard and walking several miles daily. -he is euolemic on exam today Etiology of HF: Nonischemic NYHA class / AHA Stage: NYHA 1 Volume status & Diuretics: continue Lasix 40mg  daily Vasodilators: continue Entresto 97-103mg  BID Beta-Blocker: continue Carvedilol 25mg  BID MRA: Continue eplerenone 50mg  daily  (gynecomastia on spironolactone) Cardiometabolic: continue Farxiga 10mg  daily Devices therapies & Valvulopathies: St Jude ICD (not a candidate for CRT due to narrow QRS complex) -I have personally reviewed and interpreted outside labs performed by patient's PCP which showed SCr 1.37, K+ 4.2 on 03/20/2023   2.  HTN  -BP controlled on exam today -continue Carvedilol 25mg  BID, Entresto 97-103mg  BID and eplernone 50mg  daily with PRN refills   3.  Paroxysmal atrial fibrillation  -He is maintaining NSR on exam and denies any palpitations -no bleeding issues on DOAC -continue prescription drug management with Tikosyn BID, Carvedilol 25mg  BID, Xarelto 20mg  daily with PRN refills for CHADS2VASC score of 3 (HTN, CHF and atherosclerosis of the aorta) -I have personally  reviewed and interpreted outside labs performed by patient's PCP which  showed Hbg 12.7 on 03/20/2023  4.  PVCs  - Holter monitor showed 8% PVC load and therefore likely not the etiology of his DCM. -he denies any recent palpitations -continue BB   5.  Nonischemic DCM  -Workup for secondary causes were negative (HIV/monoclonal Ab/normal ferritin).  -Cardiac cath 08/2014 with no CAD. -S/P AICD for primary prevention followed in device clinic by Dr. [name]  -Not a candidate for CRT due to narrow QRS.   6.  Dilated aortic root and ascending aorta -mildly dilated on echo 01/2020 at 42mm and 40mm by echo 01/2021 -repeat echo 01/2022 showed 43 mm aortic root and 41 mm ascending aorta which is slightly increased from prior evaluation -chest CT 12/02/2021 with ascending aorta measuring 43 mm -repeat 2D echo   7.  HLD -LDL goal < 70  -I have personally reviewed and interpreted outside labs performed by patient's PCP which showedLDL 42, HDL 27 on 03/20/2023 and ALT 9 on 01/19/23 -Continue prescription drug management with Atorvastatin 20mg  daily with PRN refills  8.  RA/Pulmonary nodules -he has pulmonary nodules followed by Pulmonary -PASP was normal on echo 01/2022  9.  Coronary artery calcifications -coronary Ca score 3129 -Cardiac PET 1/24 showed mild ischemia in mid anterolateral wall with normal LAD stress flow, low risk from ischemia standpoint.  -he has not had any anginal sx since I saw him last -no CAD in 2016 -No aspirin due to DOAC -continue prescription drug management with Atorvastatin 80mg  daily, Carvedilol 25mg  BID with PRN refills  Time:   Today, I have spent 20 minutes with the patient with telehealth technology discussing the above problems.     Medication Adjustments/Labs and Tests Ordered: Current medicines are reviewed at length with the patient today.  Concerns regarding medicines are outlined above.   Tests Ordered: No orders of the defined types were placed in this encounter.   Medication Changes: No orders of the defined types  were placed in this encounter.   Follow Up:  In Person 6 months  Signed, Armanda Magic, MD  05/09/2023 9:16 AM    [facility]

## 2023-05-09 ENCOUNTER — Other Ambulatory Visit: Payer: Self-pay | Admitting: Cardiology

## 2023-05-09 ENCOUNTER — Encounter: Payer: Self-pay | Admitting: Cardiology

## 2023-05-09 ENCOUNTER — Ambulatory Visit: Payer: Medicare Other | Attending: Cardiology | Admitting: Cardiology

## 2023-05-09 VITALS — BP 102/64 | HR 62 | Ht 71.0 in | Wt 211.2 lb

## 2023-05-09 DIAGNOSIS — R931 Abnormal findings on diagnostic imaging of heart and coronary circulation: Secondary | ICD-10-CM

## 2023-05-09 DIAGNOSIS — I420 Dilated cardiomyopathy: Secondary | ICD-10-CM

## 2023-05-09 DIAGNOSIS — E7800 Pure hypercholesterolemia, unspecified: Secondary | ICD-10-CM | POA: Diagnosis not present

## 2023-05-09 DIAGNOSIS — I77810 Thoracic aortic ectasia: Secondary | ICD-10-CM | POA: Diagnosis not present

## 2023-05-09 DIAGNOSIS — I5022 Chronic systolic (congestive) heart failure: Secondary | ICD-10-CM

## 2023-05-09 DIAGNOSIS — R918 Other nonspecific abnormal finding of lung field: Secondary | ICD-10-CM | POA: Diagnosis not present

## 2023-05-09 DIAGNOSIS — I493 Ventricular premature depolarization: Secondary | ICD-10-CM

## 2023-05-09 DIAGNOSIS — I10 Essential (primary) hypertension: Secondary | ICD-10-CM

## 2023-05-09 DIAGNOSIS — I480 Paroxysmal atrial fibrillation: Secondary | ICD-10-CM

## 2023-05-09 NOTE — Patient Instructions (Signed)
Medication Instructions:  Your physician recommends that you continue on your current medications as directed. Please refer to the Current Medication list given to you today.  *If you need a refill on your cardiac medications before your next appointment, please call your pharmacy*   Lab Work: NONE If you have labs (blood work) drawn today and your tests are completely normal, you will receive your results only by: MyChart Message (if you have MyChart) OR A paper copy in the mail If you have any lab test that is abnormal or we need to change your treatment, we will call you to review the results.   Testing/Procedures: NONE   Follow-Up: At [facility], you and your health needs are our priority.  As part of our continuing mission to provide you with exceptional heart care, we have created designated Provider Care Teams.  These Care Teams include your primary Cardiologist (physician) and Advanced Practice Providers (APPs -  Physician Assistants and Nurse Practitioners) who all work together to provide you with the care you need, when you need it.  We recommend signing up for the patient portal called "MyChart".  Sign up information is provided on this After Visit Summary.  MyChart is used to connect with patients for Virtual Visits (Telemedicine).  Patients are able to view lab/test results, encounter notes, upcoming appointments, etc.  Non-urgent messages can be sent to your provider as well.   To learn more about what you can do with MyChart, go to [email or web address].    Your next appointment:   6 month(s)  Provider:   Armanda Magic, MD

## 2023-05-12 ENCOUNTER — Other Ambulatory Visit: Payer: Self-pay | Admitting: Cardiology

## 2023-05-30 ENCOUNTER — Encounter (HOSPITAL_COMMUNITY): Payer: Self-pay | Admitting: Cardiology

## 2023-05-30 ENCOUNTER — Other Ambulatory Visit (HOSPITAL_COMMUNITY): Payer: Self-pay | Admitting: Cardiology

## 2023-05-30 MED ORDER — DAPAGLIFLOZIN PROPANEDIOL 10 MG PO TABS: 10.0000 mg | ORAL_TABLET | Freq: Every day | ORAL | 1 refills | Status: DC

## 2023-06-04 DIAGNOSIS — M546 Pain in thoracic spine: Secondary | ICD-10-CM | POA: Diagnosis not present

## 2023-06-05 ENCOUNTER — Ambulatory Visit: Payer: Medicare Other

## 2023-06-05 DIAGNOSIS — I420 Dilated cardiomyopathy: Secondary | ICD-10-CM | POA: Diagnosis not present

## 2023-06-06 ENCOUNTER — Encounter: Payer: Self-pay | Admitting: Internal Medicine

## 2023-06-06 LAB — CUP PACEART REMOTE DEVICE CHECK
Battery Remaining Longevity: 12 mo
Battery Remaining Percentage: 11 %
Battery Voltage: 2.69 V
Brady Statistic RV Percent Paced: 1 %
Date Time Interrogation Session: 20250422073512
HighPow Impedance: 69 Ohm
HighPow Impedance: 69 Ohm
Implantable Lead Connection Status: 753985
Implantable Lead Implant Date: 20160622
Implantable Lead Location: 753860
Implantable Lead Model: 181
Implantable Lead Serial Number: 331169
Implantable Pulse Generator Implant Date: 20160622
Lead Channel Impedance Value: 360 Ohm
Lead Channel Pacing Threshold Amplitude: 0.75 V
Lead Channel Pacing Threshold Pulse Width: 0.5 ms
Lead Channel Sensing Intrinsic Amplitude: 6.3 mV
Lead Channel Setting Pacing Amplitude: 2.5 V
Lead Channel Setting Pacing Pulse Width: 0.5 ms
Lead Channel Setting Sensing Sensitivity: 0.5 mV
Pulse Gen Serial Number: 7280026
Zone Setting Status: 755011

## 2023-06-08 ENCOUNTER — Other Ambulatory Visit: Payer: Self-pay | Admitting: Cardiology

## 2023-06-08 DIAGNOSIS — I480 Paroxysmal atrial fibrillation: Secondary | ICD-10-CM

## 2023-06-08 NOTE — Telephone Encounter (Signed)
 Xarelto  20mg  refill request received. Pt is 68 years old, weight-95.8kg, Crea- 1.37 on 03/20/23, last seen by Dr. Micael Adas on 05/09/23, Diagnosis-Afib, CrCl- 70.9 mL/min; Dose is appropriate based on dosing criteria. Will send in refill to requested pharmacy.

## 2023-06-25 ENCOUNTER — Other Ambulatory Visit: Payer: Self-pay | Admitting: Cardiology

## 2023-06-25 ENCOUNTER — Other Ambulatory Visit (HOSPITAL_COMMUNITY): Payer: Self-pay | Admitting: Cardiology

## 2023-06-25 DIAGNOSIS — I4819 Other persistent atrial fibrillation: Secondary | ICD-10-CM

## 2023-06-27 ENCOUNTER — Encounter: Payer: Self-pay | Admitting: Cardiology

## 2023-07-17 DIAGNOSIS — J069 Acute upper respiratory infection, unspecified: Secondary | ICD-10-CM | POA: Diagnosis not present

## 2023-07-17 DIAGNOSIS — R051 Acute cough: Secondary | ICD-10-CM | POA: Diagnosis not present

## 2023-07-17 DIAGNOSIS — B9689 Other specified bacterial agents as the cause of diseases classified elsewhere: Secondary | ICD-10-CM | POA: Diagnosis not present

## 2023-07-17 DIAGNOSIS — H6691 Otitis media, unspecified, right ear: Secondary | ICD-10-CM | POA: Diagnosis not present

## 2023-07-19 NOTE — Progress Notes (Signed)
 Remote ICD transmission.

## 2023-07-19 NOTE — Addendum Note (Signed)
 Addended by: Lott Rouleau A on: 07/19/2023 03:06 PM   Modules accepted: Orders

## 2023-08-03 ENCOUNTER — Other Ambulatory Visit (HOSPITAL_COMMUNITY)

## 2023-08-29 ENCOUNTER — Encounter (HOSPITAL_COMMUNITY): Admitting: Cardiology

## 2023-08-31 ENCOUNTER — Encounter (HOSPITAL_COMMUNITY): Payer: Self-pay | Admitting: Cardiology

## 2023-08-31 ENCOUNTER — Ambulatory Visit (HOSPITAL_COMMUNITY)
Admission: RE | Admit: 2023-08-31 | Discharge: 2023-08-31 | Disposition: A | Discharge status: Home / Self Care | Source: Ambulatory Visit | Attending: Family Medicine | Admitting: Family Medicine

## 2023-08-31 ENCOUNTER — Ambulatory Visit (HOSPITAL_BASED_OUTPATIENT_CLINIC_OR_DEPARTMENT_OTHER)
Admission: RE | Admit: 2023-08-31 | Discharge: 2023-08-31 | Disposition: A | Discharge status: Home / Self Care | Source: Ambulatory Visit | Attending: Cardiology | Admitting: Cardiology

## 2023-08-31 ENCOUNTER — Ambulatory Visit (HOSPITAL_COMMUNITY): Payer: Self-pay | Admitting: Cardiology

## 2023-08-31 VITALS — BP 120/72 | HR 54 | Ht 71.0 in | Wt 211.0 lb

## 2023-08-31 DIAGNOSIS — N62 Hypertrophy of breast: Secondary | ICD-10-CM | POA: Diagnosis not present

## 2023-08-31 DIAGNOSIS — Z7984 Long term (current) use of oral hypoglycemic drugs: Secondary | ICD-10-CM | POA: Insufficient documentation

## 2023-08-31 DIAGNOSIS — Z96611 Presence of right artificial shoulder joint: Secondary | ICD-10-CM | POA: Diagnosis not present

## 2023-08-31 DIAGNOSIS — I428 Other cardiomyopathies: Secondary | ICD-10-CM | POA: Diagnosis not present

## 2023-08-31 DIAGNOSIS — I493 Ventricular premature depolarization: Secondary | ICD-10-CM | POA: Diagnosis not present

## 2023-08-31 DIAGNOSIS — I5022 Chronic systolic (congestive) heart failure: Secondary | ICD-10-CM | POA: Insufficient documentation

## 2023-08-31 DIAGNOSIS — I444 Left anterior fascicular block: Secondary | ICD-10-CM | POA: Insufficient documentation

## 2023-08-31 DIAGNOSIS — Z79899 Other long term (current) drug therapy: Secondary | ICD-10-CM | POA: Diagnosis not present

## 2023-08-31 DIAGNOSIS — Z95810 Presence of automatic (implantable) cardiac defibrillator: Secondary | ICD-10-CM | POA: Insufficient documentation

## 2023-08-31 DIAGNOSIS — I130 Hypertensive heart and chronic kidney disease with heart failure and stage 1 through stage 4 chronic kidney disease, or unspecified chronic kidney disease: Secondary | ICD-10-CM | POA: Diagnosis not present

## 2023-08-31 DIAGNOSIS — I2510 Atherosclerotic heart disease of native coronary artery without angina pectoris: Secondary | ICD-10-CM | POA: Insufficient documentation

## 2023-08-31 DIAGNOSIS — M069 Rheumatoid arthritis, unspecified: Secondary | ICD-10-CM | POA: Insufficient documentation

## 2023-08-31 DIAGNOSIS — I351 Nonrheumatic aortic (valve) insufficiency: Secondary | ICD-10-CM | POA: Diagnosis not present

## 2023-08-31 DIAGNOSIS — I480 Paroxysmal atrial fibrillation: Secondary | ICD-10-CM | POA: Insufficient documentation

## 2023-08-31 DIAGNOSIS — Z7901 Long term (current) use of anticoagulants: Secondary | ICD-10-CM | POA: Insufficient documentation

## 2023-08-31 DIAGNOSIS — R001 Bradycardia, unspecified: Secondary | ICD-10-CM | POA: Insufficient documentation

## 2023-08-31 DIAGNOSIS — N183 Chronic kidney disease, stage 3 unspecified: Secondary | ICD-10-CM | POA: Insufficient documentation

## 2023-08-31 LAB — CBC
HCT: 38.3 % — ABNORMAL LOW (ref 39.0–52.0)
Hemoglobin: 12.5 g/dL — ABNORMAL LOW (ref 13.0–17.0)
MCH: 28.4 pg (ref 26.0–34.0)
MCHC: 32.6 g/dL (ref 30.0–36.0)
MCV: 87 fL (ref 80.0–100.0)
Platelets: 268 K/uL (ref 150–400)
RBC: 4.4 MIL/uL (ref 4.22–5.81)
RDW: 14.2 % (ref 11.5–15.5)
WBC: 10 K/uL (ref 4.0–10.5)
nRBC: 0 % (ref 0.0–0.2)

## 2023-08-31 LAB — BRAIN NATRIURETIC PEPTIDE: B Natriuretic Peptide: 94 pg/mL (ref 0.0–100.0)

## 2023-08-31 LAB — ECHOCARDIOGRAM COMPLETE
Area-P 1/2: 2.32 cm2
Calc EF: 52.1 %
S' Lateral: 3.9 cm
Single Plane A2C EF: 56.8 %
Single Plane A4C EF: 51 %

## 2023-08-31 LAB — BASIC METABOLIC PANEL WITH GFR
Anion gap: 10 (ref 5–15)
BUN: 20 mg/dL (ref 8–23)
CO2: 24 mmol/L (ref 22–32)
Calcium: 9.6 mg/dL (ref 8.9–10.3)
Chloride: 102 mmol/L (ref 98–111)
Creatinine, Ser: 1.24 mg/dL (ref 0.61–1.24)
GFR, Estimated: >60 mL/min (ref >60)
Glucose, Bld: 109 mg/dL — ABNORMAL HIGH (ref 70–99)
Potassium: 4.8 mmol/L (ref 3.5–5.1)
Sodium: 136 mmol/L (ref 135–145)

## 2023-08-31 NOTE — Patient Instructions (Signed)
 Good to see you today!   No medication changes were made  Your physician recommends that you schedule a follow-up appointment 6 months (January) Call office in November to schedule an appointment  If you have any questions or concerns before your next appointment please send us  a message through [place] or call our office at [phone].    TO LEAVE A MESSAGE FOR THE NURSE SELECT OPTION 2, PLEASE LEAVE A MESSAGE INCLUDING: YOUR NAME DATE OF BIRTH CALL BACK NUMBER REASON FOR CALL**this is important as we prioritize the call backs  YOU WILL RECEIVE A CALL BACK THE SAME DAY AS LONG AS YOU CALL BEFORE 4:00 PM At the [facility], you and your health needs are our priority. As part of our continuing mission to provide you with exceptional heart care, we have created designated Provider Care Teams. These Care Teams include your primary Cardiologist (physician) and Advanced Practice Providers (APPs- Physician Assistants and Nurse Practitioners) who all work together to provide you with the care you need, when you need it.   You may see any of the following providers on your designated Care Team at your next follow up: Dr Toribio Fuel Dr Ezra Shuck Dr. Ria Commander Dr. Morene Brownie Amy Lenetta, NP Caffie Shed, [place] [facility] [place], [place] Beckey Coe, NP [place] Lee, NP Ellouise Class, NP Tinnie Redman, PharmD Jaun Bash, PharmD   Please be sure to bring in all your medications bottles to every appointment.    Thank you for choosing [facility]

## 2023-09-01 NOTE — Progress Notes (Signed)
 Patient ID: Terry [name], male   DOB: [birth year], 68 y.o.   MRN: [id number]  Primary Care: Terry Lesches, MD Primary Cardiologist: Terry Bihari, MD HF Cardiologist: Terry Terry [name]  Chief complaint: CHF  HPI: Terry [name] is a 68 y.o. 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 Back in 2006, an echo showed EF 15-20%.  LHC at that time showed normal coronaries.  He has been managed for cardiomyopathy since that time. In 12/14, EF had improved to 50%.  However, he has had a decline since then.  Last echo in 5/16 showed EF 25%.  Atrial fibrillation was first noted in 2013.  In 7/15, he had DCCV to NSR.  In 1/16, he was noted to be back in atrial fibrillation and appears to have been in atrial fibrillation since that time. He had St Jude ICD placed by Terry [name] in 6/16.  Given fall in EF, Lexiscan  Cardiolite  was done. This showed EF 39% with partially reversible inferior and apical perfusion defect. He had LHC in 7/16 showing no significant CAD.  In 8/16, he was admitted for Tikosyn  initiation and converted to NSR.  Echo in 8/18 showed EF 35-40%, mild to moderately decreased RV systolic function.  Echo in 8/19 showed EF 40-45%, mild-moderate MR, mild to moderately decreased RV systolic function.   Holter in 8/19 showed 8% PVCs.   He had right TKR in 10/19 without complications.   Echo in 10/20 showed EF stable at 40-45%. Echo in 12/21 showed EF 35-40%, moderate RV enlargement with normal systolic function, 4.2 cm ascending aorta.   Echo in 12/22 showed EF 40% with mild LV dilation, mild RV dilation, normal RV systolic function, mild-moderate MR.   Patient had calcium  score in 10/23, 3129 Agatston units (98th percentile).  He was sent up for cardiac PET, still pending.   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  He got rid of the [place] and was given a course of prednisone ,  coughing resolved.   Echo 12/23 EF 35%, global hypokinesis, mildly decreased RV systolic function, mild MR, mild AI, normal IVC. Cardiac PET stress test showed mild ischemia in mid anterolateral wall with normal LAD stress flow, low risk from ischemia standpoint.   S/p R shoulder arthroplasty (2/24).  Echo was done today and reviewed, EF improved in 45-50% range with mild LV dilation, mild AI, normal RV size with mildly decreased systolic function, IVC normal, aortic root 4.3 cm.   Today he returns for HF follow up with his wife.  He has been doing well symptomatically.  Swims daily and goes to the gym.  No exertional dyspnea or chest pain.  No palpitations.  No orthopnea/PND.  Weight stable.   ECG (personally reviewed): NSR, LAFB, septal Qs, QTc ok.   St Jude device interrogation (personally reviewed): Stable thoracic impedance, no VT  HIV negative, ferritin normal, immunofixation with no monoclonal protein.  Labs (10/23): LDL 64, Lp(a) 26, LDL particle number 947, K 4.3, creatinine 1.37 Labs (2/24): K 4.2, creatinine 1.13, LDL 43 Labs (3/24): K 4, creatinine 1.36, BNP 48 Labs (7/24): K 4.5, creatinine 1.5 Labs (2/25): K 4.2, creatinine 1.37  PMH 1. Nonischemic dilated cardiomyopathy: Echo (2006) with EF 15-20%.  LHC in 10/06 showed normal coronaries.  By 2014, EF had improved to 50%.  Echo in 1/16 showed EF 35-40%.  Echo (5/16) showed EF 25% with mild MR.  St Jude ICD 6/16.  HIV negative, immunofixation with no monoclonal protein, and ferritin normal.  Lexiscan  Cardiolite  (7/16) with EF 39% and partially reversible inferior and apical perfusion defect (intermediate risk).   LHC (7/16) with EF 35-40%, no significant CAD.  - Echo (8/17): EF 45-50% - Echo (8/18): EF 35-40%, moderate diastoilc dysfunction, mild AI, mild MR, mild to moderately decreased RV systolic function.   - Echo (8/19): EF 40-45%, mild-moderate MR, mild to moderately decreased RV systolic function.  - Echo (10/20): EF  40-45%, mild LVH, normal RV, mild MR.  - Echo (12/21): EF 35-40%, moderate RV enlargement with normal systolic function, 4.2 cm ascending aorta.  - Echo (12/22): EF 40% with mild LV dilation, mild RV dilation, normal RV systolic function, mild-moderate MR. - Echo (12/23): EF 35%, global hypokinesis, mildly decreased RV systolic function, mild MR, mild AI, normal IVC. - Echo (7/25): EF improved in 45-50% range with mild LV dilation, mild AI, normal RV size with mildly decreased systolic function, IVC normal, aortic root 4.3 cm.  2. Atrial fibrillation: Paroxysmal.  First noted in 2013.  DCCV in 7/15.  Back in atrial fibrillation 1/16.  Converted back to NSR with Tikosyn  in 8/16.  3. HTN 4. Rheumatoid Arthritis: Has been off all medications for several years.   5. H/o bilateral THR 6. Gynecomastia with spironolactone .  7. PVCs: Holter in 8/19 showed 8% PVCs.  8. Ascending aortic aneurysm: 4.2 cm ascending aorta on 12/21 echo.  - CT chest (7/22) with 4.1 cm ascending aorta - CT chest (10/23) with 4.2 can ascending aorta.  9. CAD: Calcium  score in 10/23, 3129 Agatston units (98th percentile).  - Cardiac PET stress test (1/24) showed mild ischemia in mid anterolateral wall with normal LAD stress flow, low risk from ischemia standpoint.  10. Hypersensitivity pneumonitis: Down and parakeet.  Resolved with prednisone  and avoidance of triggers.   SH: Lives at home with wife and 2 children and a grandchild. Never smoker, drinks 1 or 2 beers a week with dinner. 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   FH: Father with MI at 71, brother with atrial fibrillation, mother with renal failure.   Review of systems complete and found to be negative unless listed in HPI.    Current Outpatient Medications  Medication Sig Dispense Refill   atorvastatin  (LIPITOR) 80 MG tablet TAKE 1 TABLET BY MOUTH EVERY DAY 90 tablet 3   carvedilol  (COREG ) 25 MG tablet TAKE 1 TABLET BY MOUTH TWICE A DAY  180 tablet 3   dapagliflozin  propanediol (FARXIGA ) 10 MG TABS tablet Take 1 tablet (10 mg total) by mouth daily before breakfast. 90 tablet 1   dofetilide  (TIKOSYN ) 250 MCG capsule Take 1 capsule (250 mcg total) by mouth 2 (two) times daily. 180 capsule 3   ENTRESTO  97-103 MG TAKE 1 TABLET BY MOUTH TWICE A DAY 180 tablet 3   eplerenone  (INSPRA ) 50 MG tablet TAKE 1 TABLET BY MOUTH EVERY DAY 90 tablet 1   furosemide  (LASIX ) 40 MG tablet TAKE 1 TABLET BY MOUTH EVERY DAY 90 tablet 3   Multiple Vitamin (MULTIVITAMIN WITH MINERALS) TABS tablet Take 1 tablet by mouth in the morning. Centrum Multivitamin     XARELTO  20 MG TABS tablet TAKE 1 TABLET BY MOUTH DAILY WITH SUPPER 90 tablet 1   No current facility-administered medications for this encounter.   Allergies  Allergen Reactions   Other Other (See Comments)   Spironolactone      Gynecomastia  Sulfa Antibiotics     Thrush and hives   BP 120/72   Pulse (!) 54   Ht 5' 11 (1.803 m)   Wt 95.7 kg (211 lb)   SpO2 98%   BMI 29.43 kg/m   Wt Readings from Last 3 Encounters:  08/31/23 95.7 kg (211 lb)  05/09/23 95.8 kg (211 lb 3.2 oz)  04/16/23 95.3 kg (210 lb)    PHYSICAL EXAM: General: NAD Neck: No JVD, no thyromegaly or thyroid  nodule.  Lungs: Clear to auscultation bilaterally with normal respiratory effort. CV: Nondisplaced PMI.  Heart regular S1/S2, no S3/S4, no murmur.  No peripheral edema.  No carotid bruit.  Normal pedal pulses.  Abdomen: Soft, nontender, no hepatosplenomegaly, no distention.  Skin: Intact without lesions or rashes.  Neurologic: Alert and oriented x 3.  Psych: Normal affect. Extremities: No clubbing or cyanosis.  HEENT: Normal.   ASSESSMENT & PLAN: 1. Chronic systolic HF: Nonischemic cardiomyopathy x years.  He has a Secondary school teacher ICD and is not a candidate for CRT due to narrow QRS.  7/16 LHC showed no significant CAD.  Echo 12/23 EF 35% with mildly decreased RV systolic function. Echo today showed EF improved in  45-50% range with mild LV dilation, mild AI, normal RV size with mildly decreased systolic function, IVC normal, aortic root 4.3 cm. NYHA class I. Not volume overloaded by exam or [place].  - Continue Lasix  40 mg daily.  BMET/BNP today. - Continue dapagliflozin  10 mg daily.    - Continue Coreg  25 mg bid.  - Continue Entresto  97/103 bid.   - Continue eplerenone  50 mg daily (gynecomastia on spiro).  2.  Atrial fibrillation: Paroxysmal.  Holding NSR on Tikosyn .  QTc ok on today's ECG.  - Continue Xarelto  20 mg daily. CBC today.  - Continue Tikosyn .   3. PVCs: Holter in 8/19 showed 8% PVCs.  This is unlikely to affect his cardiomyopathy.   4. CKD: Stage 3.  Baseline SCr 1.3-1.5 - Continue dapagliflozin , BMET today.  5. Dilated ascending aorta: 4.2 cm on CT chest in 10/23. Echo 12/23 showed 43 mm aortic root and 41 mm ascending aorta.  Echo today showed 4.3 cm ascending aorta.   6. Hypersensitivity pneumonitis: Cough resolved after getting rid of down around his house and getting rid of parakeet.  7. CAD: Calcium  score 3129, 98th percentile.  Cardiac PET 1/24 showed mild ischemia in mid anterolateral wall with normal LAD stress flow, low risk from ischemia standpoint. He is asymptomatic with no chest pain or exertional dyspnea.  - Continue atorvastatin  80 mg daily, good lipids 2/25 (goal LDL < 55).   Followup in 6 months with APP.   I spent 32 minutes reviewing records, interviewing/examining patient, and managing order   Ezra Shuck, MD  09/01/2023

## 2023-09-03 ENCOUNTER — Ambulatory Visit (HOSPITAL_COMMUNITY): Payer: Self-pay | Admitting: Family Medicine

## 2023-09-04 ENCOUNTER — Ambulatory Visit: Payer: Medicare Other

## 2023-09-04 DIAGNOSIS — I420 Dilated cardiomyopathy: Secondary | ICD-10-CM | POA: Diagnosis not present

## 2023-09-05 LAB — CUP PACEART REMOTE DEVICE CHECK
Battery Remaining Longevity: 10 mo
Battery Remaining Percentage: 9 %
Battery Voltage: 2.68 V
Brady Statistic RV Percent Paced: 1 %
Date Time Interrogation Session: 20250722140557
HighPow Impedance: 65 Ohm
HighPow Impedance: 65 Ohm
Implantable Lead Connection Status: 753985
Implantable Lead Implant Date: 20160622
Implantable Lead Location: 753860
Implantable Lead Model: 181
Implantable Lead Serial Number: 331169
Implantable Pulse Generator Implant Date: 20160622
Lead Channel Impedance Value: 340 Ohm
Lead Channel Pacing Threshold Amplitude: 0.75 V
Lead Channel Pacing Threshold Pulse Width: 0.5 ms
Lead Channel Sensing Intrinsic Amplitude: 7.6 mV
Lead Channel Setting Pacing Amplitude: 2.5 V
Lead Channel Setting Pacing Pulse Width: 0.5 ms
Lead Channel Setting Sensing Sensitivity: 0.5 mV
Pulse Gen Serial Number: 7280026
Zone Setting Status: 755011

## 2023-09-07 ENCOUNTER — Other Ambulatory Visit: Payer: Self-pay | Admitting: Family Medicine

## 2023-09-07 DIAGNOSIS — N281 Cyst of kidney, acquired: Secondary | ICD-10-CM

## 2023-09-09 ENCOUNTER — Ambulatory Visit: Payer: Self-pay | Admitting: Internal Medicine

## 2023-09-13 ENCOUNTER — Ambulatory Visit
Admission: RE | Admit: 2023-09-13 | Discharge: 2023-09-13 | Disposition: A | Discharge status: Home / Self Care | Source: Ambulatory Visit | Attending: Family Medicine | Admitting: Family Medicine

## 2023-09-13 DIAGNOSIS — N281 Cyst of kidney, acquired: Secondary | ICD-10-CM

## 2023-09-13 DIAGNOSIS — N2889 Other specified disorders of kidney and ureter: Secondary | ICD-10-CM | POA: Diagnosis not present

## 2023-09-20 ENCOUNTER — Other Ambulatory Visit: Payer: Self-pay | Admitting: Family Medicine

## 2023-09-20 DIAGNOSIS — N281 Cyst of kidney, acquired: Secondary | ICD-10-CM

## 2023-09-26 ENCOUNTER — Ambulatory Visit
Admission: RE | Admit: 2023-09-26 | Discharge: 2023-09-26 | Disposition: A | Discharge status: Home / Self Care | Source: Ambulatory Visit | Attending: Family Medicine | Admitting: Family Medicine

## 2023-09-26 DIAGNOSIS — K8020 Calculus of gallbladder without cholecystitis without obstruction: Secondary | ICD-10-CM | POA: Diagnosis not present

## 2023-09-26 DIAGNOSIS — N281 Cyst of kidney, acquired: Secondary | ICD-10-CM

## 2023-09-26 MED ORDER — IOPAMIDOL (ISOVUE-370) INJECTION 76%
80.0000 mL | Freq: Once | INTRAVENOUS | Status: AC | PRN
  Administered 2023-09-26 (×2): 80 mL via INTRAVENOUS

## 2023-10-15 ENCOUNTER — Encounter: Payer: Self-pay | Admitting: Cardiology

## 2023-10-18 ENCOUNTER — Encounter: Payer: Self-pay | Admitting: Cardiology

## 2023-10-18 ENCOUNTER — Ambulatory Visit: Attending: Cardiology | Admitting: Cardiology

## 2023-10-18 VITALS — BP 94/62 | HR 62 | Ht 71.0 in | Wt 210.4 lb

## 2023-10-18 DIAGNOSIS — I420 Dilated cardiomyopathy: Secondary | ICD-10-CM

## 2023-10-18 DIAGNOSIS — E7800 Pure hypercholesterolemia, unspecified: Secondary | ICD-10-CM | POA: Diagnosis not present

## 2023-10-18 DIAGNOSIS — I480 Paroxysmal atrial fibrillation: Secondary | ICD-10-CM | POA: Diagnosis not present

## 2023-10-18 DIAGNOSIS — R931 Abnormal findings on diagnostic imaging of heart and coronary circulation: Secondary | ICD-10-CM | POA: Diagnosis not present

## 2023-10-18 DIAGNOSIS — I5022 Chronic systolic (congestive) heart failure: Secondary | ICD-10-CM

## 2023-10-18 DIAGNOSIS — I10 Essential (primary) hypertension: Secondary | ICD-10-CM

## 2023-10-18 DIAGNOSIS — R918 Other nonspecific abnormal finding of lung field: Secondary | ICD-10-CM

## 2023-10-18 DIAGNOSIS — I77810 Thoracic aortic ectasia: Secondary | ICD-10-CM

## 2023-10-18 DIAGNOSIS — I493 Ventricular premature depolarization: Secondary | ICD-10-CM | POA: Diagnosis not present

## 2023-10-18 NOTE — Patient Instructions (Signed)
 Medication Instructions:  Your physician recommends that you continue on your current medications as directed. Please refer to the Current Medication list given to you today.  *If you need a refill on your cardiac medications before your next appointment, please call your pharmacy*  Lab Work: None.  If you have labs (blood work) drawn today and your tests are completely normal, you will receive your results only by: MyChart Message (if you have MyChart) OR A paper copy in the mail If you have any lab test that is abnormal or we need to change your treatment, we will call you to review the results.  Testing/Procedures: Your physician has requested that you have an echocardiogram in September 2026. Echocardiography is a painless test that uses sound waves to create images of your heart. It provides your doctor with information about the size and shape of your heart and how well your heart's chambers and valves are working. This procedure takes approximately one hour. There are no restrictions for this procedure. Please do NOT wear cologne, perfume, aftershave, or lotions (deodorant is allowed). Please arrive 15 minutes prior to your appointment time.  Please note: We ask at that you not bring children with you during ultrasound (echo/ vascular) testing. Due to room size and safety concerns, children are not allowed in the ultrasound rooms during exams. Our front office staff cannot provide observation of children in our lobby area while testing is being conducted. An adult accompanying a patient to their appointment will only be allowed in the ultrasound room at the discretion of the ultrasound technician under special circumstances. We apologize for any inconvenience.   Follow-Up: At [facility], you and your health needs are our priority.  As part of our continuing mission to provide you with exceptional heart care, our providers are all part of one team.  This team includes your  primary Cardiologist (physician) and Advanced Practice Providers or APPs (Physician Assistants and Nurse Practitioners) who all work together to provide you with the care you need, when you need it.  Your next appointment:   1 year(s)  Provider:   Wilbert Bihari, MD

## 2023-10-18 NOTE — Progress Notes (Signed)
 `   Date:  10/18/2023   ID:  Terry [name], DOB 07/29/1955, MRN [id number] The patient was identified using 2 identifiers.  PCP:  Terry Elsie SAUNDERS, MD   [facility] Providers Cardiologist:  Terry Bihari, MD Advanced Heart Failure:  Terry Shuck, MD     Evaluation Performed:  Follow-Up Visit  Chief Complaint:  HF, HTN, PAF, DCM   History of Present Illness:    Terry [name] is a 68 y.o. 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 Back in 2006, an echo showed EF 15-20%.  LHC at that time showed normal coronaries.  He has been managed for cardiomyopathy since that time.   In 12/14, EF had improved to 50%.  However, he then had a decline with echo in 5/16 showing EF 25%.  Atrial fibrillation was first noted in 2013.  In 7/15, he had DCCV to NSR.  In 1/16, he was noted to be back in atrial fibrillation. He had St Jude ICD placed by Dr Terry [name] in 6/16.  Given fall in EF, Lexiscan  Cardiolite  was done. This showed EF 39% with partially reversible inferior and apical perfusion defect. He had LHC in 7/16 showing no significant CAD.  In 8/16, he was admitted for Tikosyn  initiation and converted to NSR.  Echo in 8/18 showed EF 35-40%, mild to moderately decreased RV systolic function.     Echo in 8/19 showed EF 40-45%, mild-moderate MR, mild to moderately decreased RV systolic function, mild to moderate MR and dilated aortic root at 43mm and ascending aorta 42mm.  Holter showed 8% PVCs by tele monitor.  He is followed by EP for his ICD. Workup for DCM included HIV (neg), ferritin (normal) and no monoclonal Ab on SPEP/UPEP.   He is followed as well by AHF service.    He has a hx of RA and also pulmonary nodules followed by Pulmonary.  HRCT showed hypersensitive pneumonitis.  He is here today doing well.  He denies any chest pain or pressure, SOB, DOE, PND, orthopnea, lower extremity edema (unless he eats something  too salty), dizziness (except when bending over and standing up too fast), palpitations or syncope.   Past Medical History:  Diagnosis Date   Agatston coronary artery calcium  score greater than 400    Cor Ca score 3129 on 11/2021   AICD (automatic cardioverter/defibrillator) present 08/05/2014   St Jude Ellipse VR   Anemia    S/P knee & hip OR   Aortic atherosclerosis (HCC)    Aortic insufficiency    mild    Ascending aorta dilation (HCC)    42mm by echo and CT 01/2020 and CT 2023   Chronic systolic CHF (congestive heart failure), NYHA class 1 (HCC)    Complication of anesthesia    hx of irregular beat after anesthesia in ~ 1990's   GERD (gastroesophageal reflux disease)    H/O hematuria    H/O hiatal hernia    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  S/P AICD for primary prevention.  Repeat EF 01/2020 35-40%.    PAF (paroxysmal atrial fibrillation) (HCC)    off and on since 2013 (08/05/2014)   PVC (premature ventricular contraction)    PVC  load 8%   Rheumatoid arthritis (HCC) dx'd 1995   Past Surgical History:  Procedure Laterality Date   BRONCHIAL WASHINGS  10/05/2020   Procedure: BRONCHIAL WASHINGS;  Surgeon: Terry Amel, MD;  Location: [facility];  Service: Endoscopy;;   CARDIAC CATHETERIZATION  ~ 2004   normal   CARDIAC CATHETERIZATION N/A 09/07/2014   Procedure: Left Heart Cath and Coronary Angiography;  Surgeon: Terry GORMAN Shuck, MD;  Location: [facility] INVASIVE CV LAB;  Service: Cardiovascular;  Laterality: N/A;   CARDIAC DEFIBRILLATOR PLACEMENT  08/05/2014   St. Jude   CARDIOVERSION N/A 08/21/2013   Procedure: CARDIOVERSION;  Surgeon: Terry JONELLE Bihari, MD;  Location: [facility] ENDOSCOPY;  Service: Cardiovascular;  Laterality: N/A;   COLONOSCOPY  2010; 2015   polyps; no polyps   ELBOW SURGERY Left 2021   Dr.  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    JOINT REPLACEMENT     NASAL SEPTUM SURGERY  ~ 1975   REVERSE SHOULDER ARTHROPLASTY Right 03/24/2022   Procedure: REVERSE SHOULDER ARTHROPLASTY;  Surgeon: Terry Selinda Dover, MD;  Location: [facility];  Service: Orthopedics;  Laterality: Right;  120   TONSILLECTOMY     TOTAL HIP ARTHROPLASTY  09/08/2011   Procedure: TOTAL HIP ARTHROPLASTY ANTERIOR APPROACH;  Surgeon: Terry [name] Poli, MD;  Location: [facility];  Service: Orthopedics;  Laterality: Right;  Right total hip replacement, left knee steroid injection   TOTAL KNEE ARTHROPLASTY Left 11/29/2012   Procedure: LEFT TOTAL KNEE ARTHROPLASTY;  Surgeon: Terry [name] Poli, MD;  Location: [facility];  Service: Orthopedics;  Laterality: Left;  FEMORAL NERVE BLOCK IN HOLDING AREA LEFT LEG   TOTAL KNEE ARTHROPLASTY Right 11/30/2017   Procedure: RIGHT TOTAL KNEE ARTHROPLASTY;  Surgeon: [name] Terry CINDERELLA, MD;  Location: [facility];  Service: Orthopedics;  Laterality: Right;   URETHRAL FISTULA REPAIR  ~ 1996   URETHRAL STRICTURE DILATATION  several times   VIDEO BRONCHOSCOPY N/A 10/05/2020   Procedure: VIDEO BRONCHOSCOPY WITHOUT FLUORO;  Surgeon: Terry Amel, MD;  Location: [facility];  Service: Endoscopy;  Laterality: N/A;     Current Meds  Medication Sig   atorvastatin  (LIPITOR) 80 MG tablet TAKE 1 TABLET BY MOUTH EVERY DAY   carvedilol  (COREG ) 25 MG tablet TAKE 1 TABLET BY MOUTH TWICE A DAY   dapagliflozin  propanediol (FARXIGA ) 10 MG TABS tablet Take 1 tablet (10 mg total) by mouth daily before breakfast.   dofetilide  (TIKOSYN ) 250 MCG capsule Take 1 capsule (250 mcg total) by mouth 2 (two) times daily.   ENTRESTO  97-103 MG TAKE 1 TABLET BY MOUTH TWICE A DAY   eplerenone  (INSPRA ) 50 MG tablet TAKE 1  TABLET BY MOUTH EVERY DAY   furosemide  (LASIX ) 40 MG tablet TAKE 1 TABLET BY MOUTH EVERY DAY   Multiple Vitamin (MULTIVITAMIN WITH MINERALS) TABS tablet Take 1 tablet by mouth in the morning. Centrum Multivitamin   XARELTO  20 MG TABS tablet TAKE 1 TABLET BY MOUTH DAILY WITH SUPPER     Allergies:   Other, Spironolactone , and Sulfa antibiotics   Social History   Tobacco Use   Smoking status: Never   Smokeless tobacco: Never  Vaping Use   Vaping status: Never Used  Substance Use Topics   Alcohol  use: Yes    Alcohol /week: 1.0 standard drink of alcohol   Types: 1 Cans of beer per week    Comment: rare   Drug use: No     Family Hx: The patient's family history includes Heart attack (age of onset: 6) in his father; Heart disease in his father; Heart failure in his brother.  ROS:   Please see the history of present illness.     All other systems reviewed and are negative.   Prior CV studies:   The following studies were reviewed today:  none  Labs/Other Tests and Data Reviewed:     Recent Labs: 03/20/2023: Magnesium  2.3 08/31/2023: B Natriuretic Peptide 94.0; BUN 20; Creatinine, Ser 1.24; Hemoglobin 12.5; Platelets 268; Potassium 4.8; Sodium 136   Recent Lipid Panel Lab Results  Component Value Date/Time   CHOL 89 03/20/2023 09:30 AM   CHOL 123 12/13/2021 08:19 AM   TRIG 101 03/20/2023 09:30 AM   HDL 27 (L) 03/20/2023 09:30 AM   HDL 42 12/13/2021 08:19 AM   CHOLHDL 3.3 03/20/2023 09:30 AM   LDLCALC 42 03/20/2023 09:30 AM   LDLCALC 64 12/13/2021 08:19 AM    Wt Readings from Last 3 Encounters:  10/18/23 210 lb 6.4 oz (95.4 kg)  08/31/23 211 lb (95.7 kg)  05/09/23 211 lb 3.2 oz (95.8 kg)        Objective:    Vital Signs:  BP 94/62   Pulse 62   Ht 5' 11 (1.803 m)   Wt 210 lb 6.4 oz (95.4 kg)   SpO2 99%   BMI 29.34 kg/m   GEN: Well nourished, well developed in no acute distress HEENT: Normal NECK: No JVD; No carotid bruits LYMPHATICS: No  lymphadenopathy CARDIAC:RRR, no murmurs, rubs, gallops RESPIRATORY:  Clear to auscultation without rales, wheezing or rhonchi  ABDOMEN: Soft, non-tender, non-distended MUSCULOSKELETAL:  No edema; No deformity  SKIN: Warm and dry NEUROLOGIC:  Alert and oriented x 3 PSYCHIATRIC:  Normal affect   ASSESSMENT & PLAN:    1.  Chronic systolic CHF -  -He has a St Jude ICD and is not a candidate for CRT due to narrow QRS.  7/16 LHC showed no significant CAD.  Echo 12/23 EF 35% with mildly decreased RV systolic function.  He has stable NHYA Class I symptoms.   -He remains active working out in his yard and walking several miles daily. -He appears euvolemic on exam today Etiology of HF: Nonischemic NYHA class / AHA Stage: NYHA 1 Volume status & Diuretics: Continue Lasix  40 mg daily Vasodilators: Continue Entresto  97-23 mg twice daily Beta-Blocker: Continue carvedilol  25 mg twice daily MRA: Continue eplerenone  50 mg daily (gynecomastia on spironolactone ) Cardiometabolic: Continue Farxiga  10 mg daily Devices therapies & Valvulopathies: St Jude ICD (not a candidate for CRT due to narrow QRS complex) -I have personally reviewed and interpreted outside labs performed by patient's PCP which showed serum creatinine 1.2 potassium 4.8 on 08/31/2023   2.  HTN  - BP on the soft side today but asymptomatic - Continue carvedilol  25 mg twice daily, Entresto  97-23 mg twice daily and eplerenone  50 mg daily with as needed refills   3.  Paroxysmal atrial fibrillation  - He mains in normal sinus rhythm with no palpitations - He denies any bleeding issues on his DOAC - Continue Tikosyn  250 mcg twice daily and Xarelto  20 mg daily as well as carvedilol  25 mg twice daily for CHADS2VASC score of 3 (HTN, CHF and atherosclerosis of the aorta) with as needed refills -I have personally reviewed and interpreted outside labs performed  by patient's PCP which showed hemoglobin 12.5 on 08/31/2023  4.  PVCs  - Holter monitor  showed 8% PVC load and therefore likely not the etiology of his DCM. - Not had any palpitations recently - Continue carvedilol  25 mg twice daily   5.  Nonischemic DCM  -Workup for secondary causes were negative (HIV/monoclonal Ab/normal ferritin).  -Cardiac cath 08/2014 with no CAD. -S/P AICD for primary prevention followed in device clinic by Dr. Waddell  -Not a candidate for CRT due to narrow QRS. - 2D echo 08/31/2023 with EF 50 to 55% and normal RV   6.  Dilated aortic root and ascending aorta -mildly dilated on echo 01/2020 at 42mm and 40mm by echo 01/2021 -repeat echo 01/2022 showed 43 mm aortic root and 41 mm ascending aorta which is slightly increased from prior evaluation -chest CT 12/02/2021 with ascending aorta measuring 43 mm -2D echo ascending aorta 43 mm 08/31/2023>> repeat 2D echo 09/01/2024   7.  HLD -LDL goal < 70  -I have personally reviewed and interpreted outside labs performed by patient's PCP which showed LDL 42, HDL 27 on 03/20/2023 and ALT 9 on 01/19/2023 - Continue atorvastatin  20 mg daily with as needed refills  8.  RA/Pulmonary nodules -he has pulmonary nodules followed by Pulmonary -PASP was normal on echo 01/2022  9.  Coronary artery calcifications -coronary Ca score 3129 -Cardiac PET 1/24 showed mild ischemia in mid anterolateral wall with normal LAD stress flow, low risk from ischemia standpoint.  - He denies any anginal symptoms -no CAD in 2016 -No aspirin  due to DOAC -Continue carvedilol  25 mg twice daily and atorvastatin  80 mg daily with as needed refills  Time:   Today, I have spent 20 minutes with the patient with telehealth technology discussing the above problems.     Medication Adjustments/Labs and Tests Ordered: Current medicines are reviewed at length with the patient today.  Concerns regarding medicines are outlined above.   Tests Ordered: No orders of the defined types were placed in this encounter.   Medication Changes: No orders of the  defined types were placed in this encounter.   Follow Up:  In Person 6 months  Signed, Terry Bihari, MD  10/18/2023 8:36 AM    [facility]

## 2023-10-18 NOTE — Addendum Note (Signed)
 Addended by: JANIT GENI CROME on: 10/18/2023 09:03 AM   Modules accepted: Orders

## 2023-10-19 ENCOUNTER — Encounter: Payer: Self-pay | Admitting: Cardiology

## 2023-10-22 ENCOUNTER — Encounter (HOSPITAL_COMMUNITY): Payer: Self-pay | Admitting: Cardiology

## 2023-10-25 ENCOUNTER — Encounter: Payer: Self-pay | Admitting: Cardiology

## 2023-10-26 ENCOUNTER — Encounter: Payer: Self-pay | Admitting: Internal Medicine

## 2023-10-26 ENCOUNTER — Ambulatory Visit: Attending: Internal Medicine | Admitting: Internal Medicine

## 2023-10-26 VITALS — BP 104/63 | HR 57 | Ht 71.0 in | Wt 212.4 lb

## 2023-10-26 DIAGNOSIS — I4819 Other persistent atrial fibrillation: Secondary | ICD-10-CM | POA: Diagnosis not present

## 2023-10-26 LAB — CUP PACEART INCLINIC DEVICE CHECK
Battery Remaining Longevity: 8 mo
Brady Statistic RV Percent Paced: 0.72 %
Date Time Interrogation Session: 20250912100148
HighPow Impedance: 68.625
Implantable Lead Connection Status: 753985
Implantable Lead Implant Date: 20160622
Implantable Lead Location: 753860
Implantable Lead Model: 181
Implantable Lead Serial Number: 331169
Implantable Pulse Generator Implant Date: 20160622
Lead Channel Impedance Value: 375 Ohm
Lead Channel Pacing Threshold Amplitude: 0.75 V
Lead Channel Pacing Threshold Amplitude: 0.75 V
Lead Channel Pacing Threshold Pulse Width: 0.5 ms
Lead Channel Pacing Threshold Pulse Width: 0.5 ms
Lead Channel Sensing Intrinsic Amplitude: 8.8 mV
Lead Channel Setting Pacing Amplitude: 2.5 V
Lead Channel Setting Pacing Pulse Width: 0.5 ms
Lead Channel Setting Sensing Sensitivity: 0.5 mV
Pulse Gen Serial Number: 7280026
Zone Setting Status: 755011

## 2023-10-26 NOTE — Progress Notes (Signed)
 HPI Terry [name] returns today for followup. He is a pleasant 68 yo man with a non-ischemic CM, persistent atrial fib, s/p ICD insertion. He denies palpitations, chest pain or sob. He has been swimming daily at [facility] and will transition to the [facility] soon. He has not had syncope. No ICD shock. He travels extensively and feels well. No ICD therapies. His EF has improved. He appears to be maintaining NSR on dofetilide . Allergies  Allergen Reactions   Other Other (See Comments)   Spironolactone      Gynecomastia   Sulfa Antibiotics     Thrush and hives     Current Outpatient Medications  Medication Sig Dispense Refill   atorvastatin  (LIPITOR) 80 MG tablet TAKE 1 TABLET BY MOUTH EVERY DAY 90 tablet 3   carvedilol  (COREG ) 25 MG tablet TAKE 1 TABLET BY MOUTH TWICE A DAY 180 tablet 3   dapagliflozin  propanediol (FARXIGA ) 10 MG TABS tablet Take 1 tablet (10 mg total) by mouth daily before breakfast. 90 tablet 1   dofetilide  (TIKOSYN ) 250 MCG capsule Take 1 capsule (250 mcg total) by mouth 2 (two) times daily. 180 capsule 3   ENTRESTO  97-103 MG TAKE 1 TABLET BY MOUTH TWICE A DAY 180 tablet 3   eplerenone  (INSPRA ) 50 MG tablet TAKE 1 TABLET BY MOUTH EVERY DAY 90 tablet 1   furosemide  (LASIX ) 40 MG tablet TAKE 1 TABLET BY MOUTH EVERY DAY 90 tablet 3   Multiple Vitamin (MULTIVITAMIN WITH MINERALS) TABS tablet Take 1 tablet by mouth in the morning. Centrum Multivitamin     XARELTO  20 MG TABS tablet TAKE 1 TABLET BY MOUTH DAILY WITH SUPPER 90 tablet 1   No current facility-administered medications for this visit.     Past Medical History:  Diagnosis Date   Agatston coronary artery calcium  score greater than 400    Cor Ca score 3129 on 11/2021   AICD (automatic cardioverter/defibrillator) present 08/05/2014   St Jude Ellipse VR   Anemia    S/P knee & hip OR   Aortic atherosclerosis (HCC)    Aortic insufficiency    mild    Ascending aorta dilation (HCC)    42mm by echo and  CT 01/2020 and CT 2023   Chronic systolic CHF (congestive heart failure), NYHA class 1 (HCC)    Complication of anesthesia    hx of irregular beat after anesthesia in ~ 1990's   GERD (gastroesophageal reflux disease)    H/O hematuria    H/O hiatal hernia    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  S/P AICD for primary prevention.  Repeat EF 01/2020 35-40%.    PAF (paroxysmal atrial fibrillation) (HCC)    off and on since 2013 (08/05/2014)   PVC (premature ventricular contraction)    PVC load 8%   Rheumatoid arthritis (HCC) dx'd 1995    ROS:   All systems reviewed and negative except as noted in the HPI.   Past Surgical History:  Procedure Laterality Date   BRONCHIAL WASHINGS  10/05/2020   Procedure: BRONCHIAL WASHINGS;  Surgeon: Geronimo Amel, MD;  Location: [facility];  Service: Endoscopy;;   CARDIAC CATHETERIZATION  ~ 2004   normal   CARDIAC CATHETERIZATION N/A 09/07/2014   Procedure: Left Heart Cath and Coronary Angiography;  Surgeon: Ezra GORMAN Shuck, MD;  Location: [facility] INVASIVE CV LAB;  Service: Cardiovascular;  Laterality: N/A;   CARDIAC DEFIBRILLATOR PLACEMENT  08/05/2014   St. Jude   CARDIOVERSION N/A 08/21/2013   Procedure: CARDIOVERSION;  Surgeon: Wilbert JONELLE Bihari, MD;  Location: [facility];  Service: Cardiovascular;  Laterality: N/A;   COLONOSCOPY  2010; 2015   polyps; no polyps   ELBOW SURGERY Left 2021   Dr. 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    JOINT REPLACEMENT     NASAL SEPTUM SURGERY  ~ 1975   REVERSE SHOULDER ARTHROPLASTY Right 03/24/2022   Procedure: REVERSE SHOULDER ARTHROPLASTY;  Surgeon:  Sharl Selinda Dover, MD;  Location: [facility];  Service: Orthopedics;  Laterality: Right;  120   TONSILLECTOMY     TOTAL HIP ARTHROPLASTY  09/08/2011   Procedure: TOTAL HIP ARTHROPLASTY ANTERIOR APPROACH;  Surgeon: Lonni CINDERELLA Poli, MD;  Location: [facility];  Service: Orthopedics;  Laterality: Right;  Right total hip replacement, left knee steroid injection   TOTAL KNEE ARTHROPLASTY Left 11/29/2012   Procedure: LEFT TOTAL KNEE ARTHROPLASTY;  Surgeon: Lonni CINDERELLA Poli, MD;  Location: [facility];  Service: Orthopedics;  Laterality: Left;  FEMORAL NERVE BLOCK IN HOLDING AREA LEFT LEG   TOTAL KNEE ARTHROPLASTY Right 11/30/2017   Procedure: RIGHT TOTAL KNEE ARTHROPLASTY;  Surgeon: Poli Lonni CINDERELLA, MD;  Location: [facility];  Service: Orthopedics;  Laterality: Right;   URETHRAL FISTULA REPAIR  ~ 1996   URETHRAL STRICTURE DILATATION  several times   VIDEO BRONCHOSCOPY N/A 10/05/2020   Procedure: VIDEO BRONCHOSCOPY WITHOUT FLUORO;  Surgeon: Geronimo Amel, MD;  Location: [facility];  Service: Endoscopy;  Laterality: N/A;     Family History  Problem Relation Age of Onset   Heart disease Father    Heart attack Father 29   Heart failure Brother      Social History   Socioeconomic History   Marital status: Married    Spouse name: Not on file   Number of children: Not on file   Years of education: Not on file   Highest education level: Not on file  Occupational History   Not on file  Tobacco Use   Smoking status: Never   Smokeless tobacco: Never  Vaping Use   Vaping status: Never Used  Substance and Sexual Activity   Alcohol  use: Yes    Alcohol /week: 1.0 standard drink of alcohol     Types: 1 Cans of beer per week    Comment: rare   Drug use: No   Sexual activity: Yes  Other Topics Concern   Not on file  Social History Narrative   Not on file   Social Drivers of Health   Financial Resource Strain: Not on file  Food Insecurity: No Food Insecurity (03/24/2022)    Hunger Vital Sign    Worried About Running Out of Food in the Last Year: Never true    Ran Out of Food in the Last Year: Never true  Transportation Needs: No Transportation Needs (03/24/2022)   PRAPARE - Administrator, Civil Service (Medical): No    Lack of Transportation (Non-Medical): No  Physical Activity: Not on file  Stress: Not on file  Social Connections: Not on file  Intimate Partner Violence: Not At Risk (03/24/2022)  Humiliation, Afraid, Rape, and Kick questionnaire    Fear of Current or Ex-Partner: No    Emotionally Abused: No    Physically Abused: No    Sexually Abused: No     BP 104/63   Pulse (!) 57   Ht 5' 11 (1.803 m)   Wt 212 lb 6.4 oz (96.3 kg)   SpO2 97%   BMI 29.62 kg/m   Physical Exam:  Well appearing NAD HEENT: Unremarkable Neck:  No JVD, no thyromegally Lymphatics:  No adenopathy Back:  No CVA tenderness Lungs:  Clear with no wheezes HEART:  Regular rate rhythm, no murmurs, no rubs, no clicks Abd:  soft, positive bowel sounds, no organomegally, no rebound, no guarding Ext:  2 plus pulses, no edema, no cyanosis, no clubbing Skin:  No rashes no nodules Neuro:  CN II through XII intact, motor grossly intact  DEVICE  Normal device function.  See PaceArt for details. 8 months to ERI.  Assess/Plan: Chronic systolic heart failure - his EF improved in recent years with maintenance of NSR and regular exercise. Continue GDMT. Persistent atrial fib - he has maintained NSR on dofetilide . ICD - his St. Jude device with a [place] lead is working normally. We will follow. He is approaching ERI.  Danelle Kyilee ,MD

## 2023-10-26 NOTE — Patient Instructions (Signed)
 Medication Instructions:  Your physician recommends that you continue on your current medications as directed. Please refer to the Current Medication list given to you today.  *If you need a refill on your cardiac medications before your next appointment, please call your pharmacy*  Lab Work: None ordered.  You may go to any [facility] Location for your lab work:  [facility] - [address] ([facility]) - [id number]  [place] - [address]   [place]  - [address]   [place] - [address] - [address] [employer]  - [address] ([facility]   If you have labs (blood work) drawn today and your tests are completely normal, you will receive your results only by: [employer] (if you have MyChart)  If you have any lab test that is abnormal or we need to change your treatment, we will call you or send a MyChart message to review the results.  Testing/Procedures: None ordered.  Follow-Up: At [facility], you and your health needs are our priority.  As part of our continuing mission to provide you with exceptional heart care, we have created designated Provider Care Teams.  These Care Teams include your primary Cardiologist (physician) and Advanced Practice Providers (APPs -  Physician Assistants and Nurse Practitioners) who all work together to provide you with the care you need, when you need it.  Your next appointment:   April 2026  The format for your next appointment:   In Person  Provider:   Donnice Primus, MD or one of the following Advanced Practice Providers on your designated Care Team:   Charlies Arthur, [place] Ozell Jodie Passey, [place] Leotis Barrack, NP  Note: Remote monitoring is used to monitor your Pacemaker/ ICD from home. This monitoring reduces the number of office visits required to check  your device to one time per year. It allows us  to keep an eye on the functioning of your device to ensure it is working properly.

## 2023-11-04 ENCOUNTER — Other Ambulatory Visit (HOSPITAL_COMMUNITY): Payer: Self-pay | Admitting: Cardiology

## 2023-11-05 DIAGNOSIS — J40 Bronchitis, not specified as acute or chronic: Secondary | ICD-10-CM | POA: Diagnosis not present

## 2023-11-16 NOTE — Progress Notes (Signed)
 Remote ICD Transmission

## 2023-11-26 ENCOUNTER — Encounter

## 2023-12-04 ENCOUNTER — Ambulatory Visit: Payer: Medicare Other

## 2023-12-06 ENCOUNTER — Ambulatory Visit (INDEPENDENT_AMBULATORY_CARE_PROVIDER_SITE_OTHER)

## 2023-12-06 DIAGNOSIS — I4819 Other persistent atrial fibrillation: Secondary | ICD-10-CM | POA: Diagnosis not present

## 2023-12-06 LAB — CUP PACEART REMOTE DEVICE CHECK
Battery Remaining Longevity: 7 mo
Battery Remaining Percentage: 6 %
Battery Voltage: 2.63 V
Brady Statistic RV Percent Paced: 1 %
Date Time Interrogation Session: 20251023064010
HighPow Impedance: 74 Ohm
HighPow Impedance: 74 Ohm
Implantable Lead Connection Status: 753985
Implantable Lead Implant Date: 20160622
Implantable Lead Location: 753860
Implantable Lead Model: 181
Implantable Lead Serial Number: 331169
Implantable Pulse Generator Implant Date: 20160622
Lead Channel Impedance Value: 360 Ohm
Lead Channel Pacing Threshold Amplitude: 0.75 V
Lead Channel Pacing Threshold Pulse Width: 0.5 ms
Lead Channel Sensing Intrinsic Amplitude: 5.8 mV
Lead Channel Setting Pacing Amplitude: 2.5 V
Lead Channel Setting Pacing Pulse Width: 0.5 ms
Lead Channel Setting Sensing Sensitivity: 0.5 mV
Pulse Gen Serial Number: 7280026
Zone Setting Status: 755011

## 2023-12-07 ENCOUNTER — Ambulatory Visit: Payer: Self-pay | Admitting: Internal Medicine

## 2023-12-08 ENCOUNTER — Other Ambulatory Visit (HOSPITAL_COMMUNITY): Payer: Self-pay | Admitting: Cardiology

## 2023-12-08 NOTE — Progress Notes (Signed)
 Remote ICD Transmission

## 2023-12-11 ENCOUNTER — Other Ambulatory Visit: Payer: Self-pay

## 2023-12-11 ENCOUNTER — Encounter: Payer: Self-pay | Admitting: Cardiology

## 2023-12-11 DIAGNOSIS — Z860100 Personal history of colon polyps, unspecified: Secondary | ICD-10-CM | POA: Diagnosis not present

## 2023-12-11 DIAGNOSIS — I4891 Unspecified atrial fibrillation: Secondary | ICD-10-CM | POA: Diagnosis not present

## 2023-12-11 DIAGNOSIS — I480 Paroxysmal atrial fibrillation: Secondary | ICD-10-CM

## 2023-12-11 DIAGNOSIS — Z7901 Long term (current) use of anticoagulants: Secondary | ICD-10-CM | POA: Diagnosis not present

## 2023-12-11 MED ORDER — RIVAROXABAN 20 MG PO TABS: 20.0000 mg | ORAL_TABLET | Freq: Every day | ORAL | 1 refills | Status: AC

## 2023-12-11 NOTE — Telephone Encounter (Signed)
 Prescription refill request for Xarelto  received.  Indication:afib Last office visit:9/25 Weight:96.3  kg Age:68 Scr:1.24  7/25 CrCl:77.66  ml/min  Prescription refilled

## 2023-12-12 ENCOUNTER — Telehealth (HOSPITAL_BASED_OUTPATIENT_CLINIC_OR_DEPARTMENT_OTHER): Payer: Self-pay

## 2023-12-12 NOTE — Telephone Encounter (Signed)
   Pre-operative Risk Assessment    Patient Name: Terry [name]  DOB: 11-Jan-1956 MRN: [id number]   Date of last office visit: 10/26/2023 - Dr. Danelle Birmingham Date of next office visit: N/A   Request for Surgical Clearance    Procedure:  Colonoscopy at [facility]   Date of Surgery:  Clearance 01/09/24                                 Surgeon:  Dr. Elsie Cree Surgeon's Group or Practice Name:  [facility]  Phone number:  [phone]  Fax number:  [phone]   Type of Clearance Requested:   - Medical  - Pharmacy:  Hold Rivaroxaban  (Xarelto ) -please advise   Type of Anesthesia:  propofol    Additional requests/questions:  PATIENT HAS A ST. JUDE ICD  Signed, Patrcia Iverson CROME   12/12/2023, 5:06 PM

## 2023-12-13 ENCOUNTER — Encounter: Payer: Self-pay | Admitting: Internal Medicine

## 2023-12-13 NOTE — Telephone Encounter (Signed)
 Dr. Shlomo, you recently saw this pt in clinic. Are you able to comment on surgical clearance for upcoming colonoscopy scheduled for 01/09/2024? Please route your response to P CV DIV PREOP. Thank you!

## 2023-12-13 NOTE — Progress Notes (Signed)
 PERIOPERATIVE PRESCRIPTION FOR IMPLANTED CARDIAC DEVICE PROGRAMMING  Patient Information: Name:  Terry [name]  DOB:  Jan 13, 1956  MRN:  [id number]  Procedure:  Colonoscopy at [facility]    Date of Surgery:  Clearance 01/09/24                                  Surgeon:  Dr. Elsie Cree Surgeon's Group or Practice Name:  [facility]  Phone number:  [phone]  Fax number:  [phone]   Type of Clearance Requested:   - Medical  - Pharmacy:  Hold Rivaroxaban  (Xarelto ) -please advise   Type of Anesthesia:  propofol  Device Information:  Clinic EP Physician:  Danelle Birmingham, MD   Device Type:  Defibrillator Manufacturer and Phone #:  St. Jude/Abbott: [phone] Pacemaker Dependent?:  No. Date of Last Device Check:  12/06/23 Normal Device Function?:  Yes.    Electrophysiologist's Recommendations:  Have magnet available. Provide continuous ECG monitoring when magnet is used or reprogramming is to be performed.  Procedure should not interfere with device function.  No device programming or magnet placement needed.  Per Device Clinic Standing Orders, Terry JINNY Silvan, RN  7:09 AM 12/13/2023

## 2023-12-19 NOTE — Telephone Encounter (Signed)
 Patient with diagnosis of atrial fibrillation on Xarelto  for anticoagulation.    Procedure:  Colonoscopy at [facility]    Date of Surgery:  Clearance 01/09/24     CHA2DS2-VASc Score = 4   This indicates a 4.8% annual risk of stroke. The patient's score is based upon: CHF History: 1 HTN History: 1 Diabetes History: 0 Stroke History: 0 Vascular Disease History: 1 Age Score: 1 Gender Score: 0    CrCl 78 Platelet count 268  Patient has not had an Afib/aflutter ablation in the last 3 months, DCCV within the last 4 weeks or a watchman implanted in the last 45 days   Per office protocol, patient can hold Xarelto  for 2 days prior to procedure.   Patient will not need bridging with Lovenox (enoxaparin) around procedure.  **This guidance is not considered finalized until pre-operative APP has relayed final recommendations.**

## 2023-12-21 NOTE — Telephone Encounter (Signed)
   Patient Name: Terry [name]  DOB: [birth year] MRN: [id number]  Primary Cardiologist: Wilbert Bihari, MD  Chart reviewed as part of pre-operative protocol coverage. Given past medical history and time since last visit, based on ACC/AHA guidelines, Terry [name] is at acceptable risk for the planned procedure without further cardiovascular testing.   Per office protocol, patient can hold Xarelto  for 2 days prior to procedure.   Patient will not need bridging with Lovenox (enoxaparin) around procedure.  The patient was advised that if he develops new symptoms prior to surgery to contact our office to arrange for a follow-up visit, and he verbalized understanding.  I will route this recommendation to the requesting party via Epic fax function and remove from pre-op pool.  Please call with questions.  Lamarr Satterfield, NP 12/21/2023, 2:38 PM

## 2023-12-27 ENCOUNTER — Encounter

## 2023-12-29 ENCOUNTER — Encounter (HOSPITAL_COMMUNITY): Payer: Self-pay | Admitting: Cardiology

## 2023-12-31 ENCOUNTER — Other Ambulatory Visit (HOSPITAL_COMMUNITY): Payer: Self-pay

## 2024-01-06 ENCOUNTER — Ambulatory Visit: Attending: Internal Medicine

## 2024-01-07 LAB — CUP PACEART REMOTE DEVICE CHECK
Battery Remaining Longevity: 7 mo
Battery Remaining Percentage: 6 %
Battery Voltage: 2.63 V
Brady Statistic RV Percent Paced: 1 %
Date Time Interrogation Session: 20251123020017
HighPow Impedance: 69 Ohm
HighPow Impedance: 69 Ohm
Implantable Lead Connection Status: 753985
Implantable Lead Implant Date: 20160622
Implantable Lead Location: 753860
Implantable Lead Model: 181
Implantable Lead Serial Number: 331169
Implantable Pulse Generator Implant Date: 20160622
Lead Channel Impedance Value: 380 Ohm
Lead Channel Pacing Threshold Amplitude: 0.75 V
Lead Channel Pacing Threshold Pulse Width: 0.5 ms
Lead Channel Sensing Intrinsic Amplitude: 8.9 mV
Lead Channel Setting Pacing Amplitude: 2.5 V
Lead Channel Setting Pacing Pulse Width: 0.5 ms
Lead Channel Setting Sensing Sensitivity: 0.5 mV
Pulse Gen Serial Number: 7280026
Zone Setting Status: 755011

## 2024-01-16 ENCOUNTER — Ambulatory Visit: Payer: Self-pay | Admitting: Internal Medicine

## 2024-01-16 ENCOUNTER — Encounter: Payer: Self-pay | Admitting: Internal Medicine

## 2024-01-28 ENCOUNTER — Encounter

## 2024-02-06 ENCOUNTER — Ambulatory Visit

## 2024-02-06 ENCOUNTER — Encounter: Payer: Self-pay | Admitting: Internal Medicine

## 2024-02-06 LAB — CUP PACEART REMOTE DEVICE CHECK
Battery Remaining Longevity: 7 mo
Battery Remaining Percentage: 6 %
Battery Voltage: 2.63 V
Brady Statistic RV Percent Paced: 1 %
Date Time Interrogation Session: 20251224020723
HighPow Impedance: 77 Ohm
HighPow Impedance: 77 Ohm
Implantable Lead Connection Status: 753985
Implantable Lead Implant Date: 20160622
Implantable Lead Location: 753860
Implantable Lead Model: 181
Implantable Lead Serial Number: 331169
Implantable Pulse Generator Implant Date: 20160622
Lead Channel Impedance Value: 330 Ohm
Lead Channel Pacing Threshold Amplitude: 0.75 V
Lead Channel Pacing Threshold Pulse Width: 0.5 ms
Lead Channel Sensing Intrinsic Amplitude: 6 mV
Lead Channel Setting Pacing Amplitude: 2.5 V
Lead Channel Setting Pacing Pulse Width: 0.5 ms
Lead Channel Setting Sensing Sensitivity: 0.5 mV
Pulse Gen Serial Number: 7280026
Zone Setting Status: 755011

## 2024-02-09 ENCOUNTER — Encounter (HOSPITAL_COMMUNITY): Payer: Self-pay | Admitting: Cardiology

## 2024-02-10 ENCOUNTER — Ambulatory Visit: Payer: Self-pay | Admitting: Internal Medicine

## 2024-02-11 ENCOUNTER — Other Ambulatory Visit (HOSPITAL_COMMUNITY): Payer: Self-pay

## 2024-02-11 MED ORDER — DAPAGLIFLOZIN PROPANEDIOL 10 MG PO TABS: 10.0000 mg | ORAL_TABLET | Freq: Every day | ORAL | 1 refills | Status: AC

## 2024-02-18 ENCOUNTER — Ambulatory Visit (HOSPITAL_COMMUNITY): Admitting: Cardiology

## 2024-02-19 ENCOUNTER — Encounter: Payer: Self-pay | Admitting: Internal Medicine

## 2024-02-27 ENCOUNTER — Ambulatory Visit (HOSPITAL_COMMUNITY): Payer: Self-pay | Admitting: Cardiology

## 2024-02-27 ENCOUNTER — Encounter (HOSPITAL_COMMUNITY): Payer: Self-pay | Admitting: Cardiology

## 2024-02-27 ENCOUNTER — Ambulatory Visit (HOSPITAL_COMMUNITY)
Admission: RE | Admit: 2024-02-27 | Discharge: 2024-02-27 | Disposition: A | Discharge status: Home / Self Care | Source: Ambulatory Visit | Attending: Cardiology | Admitting: Cardiology

## 2024-02-27 VITALS — BP 104/70 | HR 58 | Wt 213.0 lb

## 2024-02-27 DIAGNOSIS — I493 Ventricular premature depolarization: Secondary | ICD-10-CM | POA: Insufficient documentation

## 2024-02-27 DIAGNOSIS — I77810 Thoracic aortic ectasia: Secondary | ICD-10-CM | POA: Insufficient documentation

## 2024-02-27 DIAGNOSIS — N183 Chronic kidney disease, stage 3 unspecified: Secondary | ICD-10-CM | POA: Diagnosis not present

## 2024-02-27 DIAGNOSIS — I2510 Atherosclerotic heart disease of native coronary artery without angina pectoris: Secondary | ICD-10-CM | POA: Insufficient documentation

## 2024-02-27 DIAGNOSIS — I130 Hypertensive heart and chronic kidney disease with heart failure and stage 1 through stage 4 chronic kidney disease, or unspecified chronic kidney disease: Secondary | ICD-10-CM | POA: Diagnosis not present

## 2024-02-27 DIAGNOSIS — Z7901 Long term (current) use of anticoagulants: Secondary | ICD-10-CM | POA: Insufficient documentation

## 2024-02-27 DIAGNOSIS — I480 Paroxysmal atrial fibrillation: Secondary | ICD-10-CM | POA: Diagnosis not present

## 2024-02-27 DIAGNOSIS — I5022 Chronic systolic (congestive) heart failure: Secondary | ICD-10-CM | POA: Insufficient documentation

## 2024-02-27 DIAGNOSIS — Z7984 Long term (current) use of oral hypoglycemic drugs: Secondary | ICD-10-CM | POA: Insufficient documentation

## 2024-02-27 DIAGNOSIS — Z79899 Other long term (current) drug therapy: Secondary | ICD-10-CM | POA: Diagnosis not present

## 2024-02-27 DIAGNOSIS — J672 Bird fancier's lung: Secondary | ICD-10-CM | POA: Diagnosis not present

## 2024-02-27 DIAGNOSIS — I110 Hypertensive heart disease with heart failure: Secondary | ICD-10-CM | POA: Diagnosis present

## 2024-02-27 DIAGNOSIS — Z95810 Presence of automatic (implantable) cardiac defibrillator: Secondary | ICD-10-CM | POA: Diagnosis not present

## 2024-02-27 DIAGNOSIS — I428 Other cardiomyopathies: Secondary | ICD-10-CM | POA: Insufficient documentation

## 2024-02-27 LAB — BASIC METABOLIC PANEL WITH GFR
Anion gap: 10 (ref 5–15)
BUN: 28 mg/dL — ABNORMAL HIGH (ref 8–23)
CO2: 28 mmol/L (ref 22–32)
Calcium: 9.7 mg/dL (ref 8.9–10.3)
Chloride: 98 mmol/L (ref 98–111)
Creatinine, Ser: 1.54 mg/dL — ABNORMAL HIGH (ref 0.61–1.24)
GFR, Estimated: 49 mL/min — ABNORMAL LOW
Glucose, Bld: 96 mg/dL (ref 70–99)
Potassium: 4.3 mmol/L (ref 3.5–5.1)
Sodium: 137 mmol/L (ref 135–145)

## 2024-02-27 LAB — LIPID PANEL
Cholesterol: 98 mg/dL (ref 0–200)
HDL: 29 mg/dL — ABNORMAL LOW
LDL Cholesterol: 40 mg/dL (ref 0–99)
Total CHOL/HDL Ratio: 3.4 ratio
Triglycerides: 147 mg/dL
VLDL: 29 mg/dL (ref 0–40)

## 2024-02-27 LAB — PRO BRAIN NATRIURETIC PEPTIDE: Pro Brain Natriuretic Peptide: 197 pg/mL

## 2024-02-27 NOTE — Progress Notes (Signed)
 Patient ID: Terry [name], male   DOB: 1955/11/23, 69 y.o.   MRN: [id number]  Primary Care: Elsie Lesches, MD Primary Cardiologist: Wilbert Bihari, MD HF Cardiologist: Dr Rolan  Chief complaint: CHF  HPI: Terry [name] is a 69 y.o. 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 Back in 2006, an echo showed EF 15-20%.  LHC at that time showed normal coronaries.  He has been managed for cardiomyopathy since that time. In 12/14, EF had improved to 50%.  However, he has had a decline since then.  Last echo in 5/16 showed EF 25%.  Atrial fibrillation was first noted in 2013.  In 7/15, he had DCCV to NSR.  In 1/16, he was noted to be back in atrial fibrillation and appears to have been in atrial fibrillation since that time. He had St Jude ICD placed by Dr Waddell in 6/16.  Given fall in EF, Lexiscan  Cardiolite  was done. This showed EF 39% with partially reversible inferior and apical perfusion defect. He had LHC in 7/16 showing no significant CAD.  In 8/16, he was admitted for Tikosyn  initiation and converted to NSR.  Echo in 8/18 showed EF 35-40%, mild to moderately decreased RV systolic function.  Echo in 8/19 showed EF 40-45%, mild-moderate MR, mild to moderately decreased RV systolic function.   Holter in 8/19 showed 8% PVCs.   He had right TKR in 10/19 without complications.   Echo in 10/20 showed EF stable at 40-45%. Echo in 12/21 showed EF 35-40%, moderate RV enlargement with normal systolic function, 4.2 cm ascending aorta.   Echo in 12/22 showed EF 40% with mild LV dilation, mild RV dilation, normal RV systolic function, mild-moderate MR.   Patient had calcium  score in 10/23, 3129 Agatston units (98th percentile).  He was sent up for cardiac PET, still pending.   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  He got rid of the parakeet and was given a course of prednisone ,  coughing resolved.   Echo 12/23 EF 35%, global hypokinesis, mildly decreased RV systolic function, mild MR, mild AI, normal IVC. Cardiac PET stress test showed mild ischemia in mid anterolateral wall with normal LAD stress flow, low risk from ischemia standpoint.   S/p R shoulder arthroplasty (2/24).  Echo in 7/25 showed EF improved in 45-50% range with mild LV dilation, mild AI, normal RV size with mildly decreased systolic function, IVC normal, aortic root 4.3 cm.   Today he returns for HF follow up with his wife.  He has been doing well symptomatically.  He exercises regularly at the [facility].  No exertional dyspnea or chest pain.  He and his wife are taking a trip to [place] this Spring.  No BRPBR/melena.  Does his yardwork with no problems.   ECG (personally reviewed): NSR, LAFB, QTc 440 msec   St Jude device interrogation (personally reviewed): No VT, stable thoracic impedance.   HIV negative, ferritin normal, immunofixation with no monoclonal protein.  Labs (10/23): LDL 64, Lp(a) 26, LDL particle number 947, K 4.3, creatinine 1.37 Labs (2/24): K 4.2, creatinine 1.13, LDL 43 Labs (3/24): K 4, creatinine 1.36, BNP 48 Labs (7/24): K 4.5, creatinine 1.5 Labs (2/25): K 4.2, creatinine 1.37 Labs (7/25): BNP 94, hgb 12.5, K 4.8, creatinine 1.24  PMH 1. Nonischemic dilated cardiomyopathy: Echo (2006) with EF 15-20%.  LHC in 10/06 showed normal coronaries.  By 2014, EF  had improved to 50%.  Echo in 1/16 showed EF 35-40%.  Echo (5/16) showed EF 25% with mild MR.  St Jude ICD 6/16.  HIV negative, immunofixation with no monoclonal protein, and ferritin normal.  Lexiscan  Cardiolite  (7/16) with EF 39% and partially reversible inferior and apical perfusion defect (intermediate risk).   LHC (7/16) with EF 35-40%, no significant CAD.  - Echo (8/17): EF 45-50% - Echo (8/18): EF 35-40%, moderate diastoilc dysfunction, mild AI, mild MR, mild to moderately decreased RV systolic function.   - Echo (8/19): EF  40-45%, mild-moderate MR, mild to moderately decreased RV systolic function.  - Echo (10/20): EF 40-45%, mild LVH, normal RV, mild MR.  - Echo (12/21): EF 35-40%, moderate RV enlargement with normal systolic function, 4.2 cm ascending aorta.  - Echo (12/22): EF 40% with mild LV dilation, mild RV dilation, normal RV systolic function, mild-moderate MR. - Echo (12/23): EF 35%, global hypokinesis, mildly decreased RV systolic function, mild MR, mild AI, normal IVC. - Echo (7/25): EF improved in 45-50% range with mild LV dilation, mild AI, normal RV size with mildly decreased systolic function, IVC normal, aortic root 4.3 cm.  2. Atrial fibrillation: Paroxysmal.  First noted in 2013.  DCCV in 7/15.  Back in atrial fibrillation 1/16.  Converted back to NSR with Tikosyn  in 8/16.  3. HTN 4. Rheumatoid Arthritis: Has been off all medications for several years.   5. H/o bilateral THR 6. Gynecomastia with spironolactone .  7. PVCs: Holter in 8/19 showed 8% PVCs.  8. Ascending aortic aneurysm: 4.2 cm ascending aorta on 12/21 echo.  - CT chest (7/22) with 4.1 cm ascending aorta - CT chest (10/23) with 4.2 can ascending aorta.  9. CAD: Calcium  score in 10/23, 3129 Agatston units (98th percentile).  - Cardiac PET stress test (1/24) showed mild ischemia in mid anterolateral wall with normal LAD stress flow, low risk from ischemia standpoint.  10. Hypersensitivity pneumonitis: Down and parakeet.  Resolved with prednisone  and avoidance of triggers.   SH: Lives at home with wife and 2 children and a grandchild. Never smoker, drinks 1 or 2 beers a week with dinner. 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   FH: Father with MI at 104, brother with atrial fibrillation, mother with renal failure.   Review of systems complete and found to be negative unless listed in HPI.    Current Outpatient Medications  Medication Sig Dispense Refill   atorvastatin  (LIPITOR) 80 MG tablet TAKE 1 TABLET BY  MOUTH EVERY DAY 90 tablet 3   carvedilol  (COREG ) 25 MG tablet TAKE 1 TABLET BY MOUTH TWICE A DAY 180 tablet 3   dapagliflozin  propanediol (FARXIGA ) 10 MG TABS tablet Take 1 tablet (10 mg total) by mouth daily before breakfast. 90 tablet 1   dofetilide  (TIKOSYN ) 250 MCG capsule Take 1 capsule (250 mcg total) by mouth 2 (two) times daily. 180 capsule 3   ENTRESTO  97-103 MG TAKE 1 TABLET BY MOUTH TWICE A DAY 180 tablet 3   eplerenone  (INSPRA ) 50 MG tablet TAKE 1 TABLET BY MOUTH EVERY DAY 90 tablet 1   furosemide  (LASIX ) 40 MG tablet TAKE 1 TABLET BY MOUTH EVERY DAY 90 tablet 3   Multiple Vitamin (MULTIVITAMIN WITH MINERALS) TABS tablet Take 1 tablet by mouth in the morning. Centrum Multivitamin     rivaroxaban  (XARELTO ) 20 MG TABS tablet Take 1 tablet (20 mg total) by mouth daily with supper. 90 tablet 1   No current facility-administered medications  for this encounter.   Allergies  Allergen Reactions   Other Other (See Comments)   Spironolactone      Gynecomastia   Sulfa Antibiotics     Thrush and hives   BP 104/70   Pulse (!) 58   Wt 96.6 kg (213 lb)   SpO2 97%   BMI 29.71 kg/m   Wt Readings from Last 3 Encounters:  02/27/24 96.6 kg (213 lb)  10/26/23 96.3 kg (212 lb 6.4 oz)  10/18/23 95.4 kg (210 lb 6.4 oz)    PHYSICAL EXAM: General: NAD Neck: No JVD, no thyromegaly or thyroid  nodule.  Lungs: Clear to auscultation bilaterally with normal respiratory effort. CV: Nondisplaced PMI.  Heart regular S1/S2, no S3/S4, no murmur.  Trace ankle edema.  No carotid bruit.  Normal pedal pulses.  Abdomen: Soft, nontender, no hepatosplenomegaly, no distention.  Skin: Intact without lesions or rashes.  Neurologic: Alert and oriented x 3.  Psych: Normal affect. Extremities: No clubbing or cyanosis.  HEENT: Normal.   ASSESSMENT & PLAN: 1. Chronic systolic HF: Nonischemic cardiomyopathy x years.  He has a Secondary School Teacher ICD and is not a candidate for CRT due to narrow QRS.  7/16 LHC showed no  significant CAD.  Echo 12/23 EF 35% with mildly decreased RV systolic function. Echo in 7/25 showed EF improved in 45-50% range with mild LV dilation, mild AI, normal RV size with mildly decreased systolic function, IVC normal, aortic root 4.3 cm. NYHA class I. Not volume overloaded by Corvue or exam.  - Continue Lasix  40 mg daily.  BMET/BNP today. - Continue dapagliflozin  10 mg daily.    - Continue Coreg  25 mg bid.  - Continue Entresto  97/103 bid.   - Continue eplerenone  50 mg daily (gynecomastia on spiro).  2.  Atrial fibrillation: Paroxysmal.  Holding NSR on Tikosyn .  QTc ok on today's ECG.  - Continue Xarelto  20 mg daily.  - Continue Tikosyn .   3. PVCs: Holter in 8/19 showed 8% PVCs.  This is unlikely to affect his cardiomyopathy.   4. CKD: Stage 3.  Baseline SCr 1.3-1.5 - Continue dapagliflozin , BMET today.  5. Dilated ascending aorta: 4.2 cm on CT chest in 10/23. Echo 12/23 showed 43 mm aortic root and 41 mm ascending aorta.  Echo in 7/25 showed 4.3 cm ascending aorta.   6. Hypersensitivity pneumonitis: Cough resolved after getting rid of down around his house and getting rid of parakeet.  7. CAD: Calcium  score 3129, 98th percentile.  Cardiac PET 1/24 showed mild ischemia in mid anterolateral wall with normal LAD stress flow, low risk from ischemia standpoint. He is asymptomatic with no chest pain or exertional dyspnea.  - Continue atorvastatin  80 mg daily, check lipids today.    Followup in 6 months with APP with echo.   I spent 32 minutes reviewing records, interviewing/examining patient, and managing order   Ezra Shuck, MD  02/27/2024

## 2024-02-27 NOTE — Patient Instructions (Signed)
 There has been no changes to your medications.  Labs done today, your results will be available in MyChart, we will contact you for abnormal readings.  Your physician has requested that you have an echocardiogram. Echocardiography is a painless test that uses sound waves to create images of your heart. It provides your doctor with information about the size and shape of your heart and how well your hearts chambers and valves are working. This procedure takes approximately one hour. There are no restrictions for this procedure. Please do NOT wear cologne, perfume, aftershave, or lotions (deodorant is allowed). Please arrive 15 minutes prior to your appointment time.  Please note: We ask at that you not bring children with you during ultrasound (echo/ vascular) testing. Due to room size and safety concerns, children are not allowed in the ultrasound rooms during exams. Our front office staff cannot provide observation of children in our lobby area while testing is being conducted. An adult accompanying a patient to their appointment will only be allowed in the ultrasound room at the discretion of the ultrasound technician under special circumstances. We apologize for any inconvenience.  Your physician recommends that you schedule a follow-up appointment in: 6 months ( July) ** PLEASE CALL THE OFFICE IN MAY TO ARRANGE YOUR FOLLOW UP APPOINTMENT.**  If you have any questions or concerns before your next appointment please send us  a message through [place] or call our office at [phone].    TO LEAVE A MESSAGE FOR THE NURSE SELECT OPTION 2, PLEASE LEAVE A MESSAGE INCLUDING: YOUR NAME DATE OF BIRTH CALL BACK NUMBER REASON FOR CALL**this is important as we prioritize the call backs  YOU WILL RECEIVE A CALL BACK THE SAME DAY AS LONG AS YOU CALL BEFORE 4:00 PM  At the [facility], you and your health needs are our priority. As part of our continuing mission to provide you with  exceptional heart care, we have created designated Provider Care Teams. These Care Teams include your primary Cardiologist (physician) and Advanced Practice Providers (APPs- Physician Assistants and Nurse Practitioners) who all work together to provide you with the care you need, when you need it.   You may see any of the following providers on your designated Care Team at your next follow up: Dr Toribio Fuel Dr Ezra Shuck Dr. Morene Brownie Greig Mosses, NP Caffie Shed, [place] [facility] Cucumber, [place] Beckey Coe, NP Jordan Lee, NP Ellouise Class, NP Tinnie Redman, PharmD Jaun Bash, PharmD   Please be sure to bring in all your medications bottles to every appointment.    Thank you for choosing [facility]

## 2024-02-28 ENCOUNTER — Encounter

## 2024-03-04 ENCOUNTER — Ambulatory Visit

## 2024-03-04 ENCOUNTER — Ambulatory Visit (HOSPITAL_COMMUNITY): Admitting: Cardiology

## 2024-03-08 ENCOUNTER — Ambulatory Visit: Attending: Cardiovascular Disease

## 2024-03-08 DIAGNOSIS — I5022 Chronic systolic (congestive) heart failure: Secondary | ICD-10-CM | POA: Diagnosis not present

## 2024-03-10 LAB — CUP PACEART REMOTE DEVICE CHECK
Battery Remaining Longevity: 7 mo
Battery Remaining Percentage: 6 %
Battery Voltage: 2.63 V
Brady Statistic RV Percent Paced: 1 %
Date Time Interrogation Session: 20260124061836
HighPow Impedance: 69 Ohm
HighPow Impedance: 69 Ohm
Implantable Lead Connection Status: 753985
Implantable Lead Implant Date: 20160622
Implantable Lead Location: 753860
Implantable Lead Model: 181
Implantable Lead Serial Number: 331169
Implantable Pulse Generator Implant Date: 20160622
Lead Channel Impedance Value: 380 Ohm
Lead Channel Pacing Threshold Amplitude: 0.75 V
Lead Channel Pacing Threshold Pulse Width: 0.5 ms
Lead Channel Sensing Intrinsic Amplitude: 8.2 mV
Lead Channel Setting Pacing Amplitude: 2.5 V
Lead Channel Setting Pacing Pulse Width: 0.5 ms
Lead Channel Setting Sensing Sensitivity: 0.5 mV
Pulse Gen Serial Number: 7280026
Zone Setting Status: 755011

## 2024-03-11 ENCOUNTER — Ambulatory Visit: Payer: Self-pay | Admitting: Cardiovascular Disease

## 2024-03-13 NOTE — Progress Notes (Signed)
 Remote ICD Transmission

## 2024-03-31 ENCOUNTER — Encounter

## 2024-04-08 ENCOUNTER — Ambulatory Visit

## 2024-04-15 ENCOUNTER — Ambulatory Visit: Admitting: Internal Medicine

## 2024-04-15 ENCOUNTER — Encounter

## 2024-05-01 ENCOUNTER — Encounter

## 2024-05-09 ENCOUNTER — Ambulatory Visit

## 2024-06-02 ENCOUNTER — Encounter

## 2024-06-03 ENCOUNTER — Ambulatory Visit

## 2024-06-09 ENCOUNTER — Ambulatory Visit

## 2024-06-26 ENCOUNTER — Ambulatory Visit: Admitting: Student in an Organized Health Care Education/Training Program

## 2024-07-03 ENCOUNTER — Encounter

## 2024-07-10 ENCOUNTER — Ambulatory Visit

## 2024-08-04 ENCOUNTER — Encounter

## 2024-08-10 ENCOUNTER — Ambulatory Visit

## 2024-09-02 ENCOUNTER — Ambulatory Visit

## 2024-09-04 ENCOUNTER — Encounter

## 2024-09-10 ENCOUNTER — Ambulatory Visit
